# Patient Record
Sex: Male | Born: 1935
Health system: Southern US, Community
[De-identification: ages and names within clinical notes are randomized; demographics above are authoritative.]

## PROBLEM LIST (undated history)

## (undated) DIAGNOSIS — C801 Malignant (primary) neoplasm, unspecified: Secondary | ICD-10-CM

## (undated) DIAGNOSIS — Z9889 Other specified postprocedural states: Secondary | ICD-10-CM

## (undated) DIAGNOSIS — G4733 Obstructive sleep apnea (adult) (pediatric): Secondary | ICD-10-CM

## (undated) DIAGNOSIS — Z8719 Personal history of other diseases of the digestive system: Secondary | ICD-10-CM

## (undated) DIAGNOSIS — M1990 Unspecified osteoarthritis, unspecified site: Secondary | ICD-10-CM

## (undated) DIAGNOSIS — I10 Essential (primary) hypertension: Secondary | ICD-10-CM

## (undated) DIAGNOSIS — D126 Benign neoplasm of colon, unspecified: Secondary | ICD-10-CM

## (undated) DIAGNOSIS — E785 Hyperlipidemia, unspecified: Secondary | ICD-10-CM

## (undated) DIAGNOSIS — H269 Unspecified cataract: Secondary | ICD-10-CM

## (undated) HISTORY — DX: Essential (primary) hypertension: I10

## (undated) HISTORY — DX: Hyperlipidemia, unspecified: E78.5

## (undated) HISTORY — DX: Benign neoplasm of colon, unspecified: D12.6

## (undated) HISTORY — DX: Unspecified osteoarthritis, unspecified site: M19.90

## (undated) HISTORY — DX: Unspecified cataract: H26.9

## (undated) HISTORY — DX: Personal history of other diseases of the digestive system: Z87.19

## (undated) HISTORY — DX: Other specified postprocedural states: Z98.890

## (undated) HISTORY — DX: Malignant (primary) neoplasm, unspecified: C80.1

## (undated) HISTORY — DX: Obstructive sleep apnea (adult) (pediatric): G47.33

---

## 1998-10-06 ENCOUNTER — Encounter: Payer: Self-pay | Admitting: Internal Medicine

## 1998-10-06 ENCOUNTER — Ambulatory Visit (HOSPITAL_COMMUNITY): Admission: RE | Admit: 1998-10-06 | Discharge: 1998-10-06 | Payer: Self-pay | Admitting: Internal Medicine

## 2001-01-01 ENCOUNTER — Ambulatory Visit (HOSPITAL_BASED_OUTPATIENT_CLINIC_OR_DEPARTMENT_OTHER): Admission: RE | Admit: 2001-01-01 | Discharge: 2001-01-01 | Payer: Self-pay | Admitting: Otolaryngology

## 2004-10-29 ENCOUNTER — Ambulatory Visit: Payer: Self-pay | Admitting: Internal Medicine

## 2004-11-14 ENCOUNTER — Ambulatory Visit: Payer: Self-pay | Admitting: Internal Medicine

## 2004-11-16 ENCOUNTER — Ambulatory Visit: Payer: Self-pay | Admitting: Internal Medicine

## 2005-03-11 ENCOUNTER — Ambulatory Visit: Payer: Self-pay | Admitting: Internal Medicine

## 2005-09-09 ENCOUNTER — Ambulatory Visit: Payer: Self-pay | Admitting: Internal Medicine

## 2005-10-02 ENCOUNTER — Ambulatory Visit: Payer: Self-pay | Admitting: Internal Medicine

## 2005-10-03 ENCOUNTER — Ambulatory Visit: Payer: Self-pay | Admitting: Internal Medicine

## 2005-10-09 ENCOUNTER — Ambulatory Visit: Payer: Self-pay | Admitting: Internal Medicine

## 2005-11-21 ENCOUNTER — Ambulatory Visit: Payer: Self-pay | Admitting: Internal Medicine

## 2006-04-16 ENCOUNTER — Ambulatory Visit: Payer: Self-pay | Admitting: Internal Medicine

## 2006-05-07 ENCOUNTER — Ambulatory Visit: Payer: Self-pay | Admitting: Internal Medicine

## 2006-06-02 ENCOUNTER — Ambulatory Visit: Payer: Self-pay | Admitting: Internal Medicine

## 2006-07-21 ENCOUNTER — Ambulatory Visit: Payer: Self-pay | Admitting: Internal Medicine

## 2006-07-31 ENCOUNTER — Ambulatory Visit: Payer: Self-pay | Admitting: Internal Medicine

## 2006-09-24 ENCOUNTER — Ambulatory Visit: Payer: Self-pay | Admitting: Internal Medicine

## 2006-12-17 ENCOUNTER — Ambulatory Visit: Payer: Self-pay | Admitting: Internal Medicine

## 2006-12-18 ENCOUNTER — Ambulatory Visit: Payer: Self-pay | Admitting: Internal Medicine

## 2006-12-18 LAB — CONVERTED CEMR LAB
ALT: 35 units/L (ref 0–40)
AST: 28 units/L (ref 0–37)
Basophils Absolute: 0 10*3/uL (ref 0.0–0.1)
Basophils Relative: 0.3 % (ref 0.0–1.0)
Chol/HDL Ratio, serum: 2.3
Cholesterol: 129 mg/dL (ref 0–200)
Creatinine, Ser: 1 mg/dL (ref 0.4–1.5)
Eosinophil percent: 1.4 % (ref 0.0–5.0)
HCT: 45.1 % (ref 39.0–52.0)
HDL: 55 mg/dL (ref >39.0)
Hemoglobin: 15.1 g/dL (ref 13.0–17.0)
LDL Cholesterol: 66 mg/dL (ref 0–99)
Lymphocytes Relative: 69.8 % — ABNORMAL HIGH (ref 12.0–46.0)
MCHC: 33.5 g/dL (ref 30.0–36.0)
MCV: 93.2 fL (ref 78.0–100.0)
Monocytes Absolute: 0.6 10*3/uL (ref 0.2–0.7)
Monocytes Relative: 4.6 % (ref 3.0–11.0)
Neutro Abs: 3 10*3/uL (ref 1.4–7.7)
Neutrophils Relative %: 23.9 % — ABNORMAL LOW (ref 43.0–77.0)
Platelets: 178 10*3/uL (ref 150–400)
Potassium: 4.2 meq/L (ref 3.5–5.1)
RBC: 4.84 M/uL (ref 4.22–5.81)
RDW: 12.7 % (ref 11.5–14.6)
TSH: 1.7 microintl units/mL (ref 0.35–5.50)
Triglyceride fasting, serum: 40 mg/dL (ref 0–149)
VLDL: 8 mg/dL (ref 0–40)
Vitamin B-12: 427 pg/mL (ref 211–911)
WBC: 12.7 10*3/uL — ABNORMAL HIGH (ref 4.5–10.5)

## 2007-05-01 ENCOUNTER — Ambulatory Visit: Payer: Self-pay | Admitting: Internal Medicine

## 2007-07-25 ENCOUNTER — Encounter: Payer: Self-pay | Admitting: *Deleted

## 2007-07-25 DIAGNOSIS — I10 Essential (primary) hypertension: Secondary | ICD-10-CM

## 2007-07-25 HISTORY — DX: Essential (primary) hypertension: I10

## 2007-09-05 DIAGNOSIS — G4733 Obstructive sleep apnea (adult) (pediatric): Secondary | ICD-10-CM

## 2007-09-05 DIAGNOSIS — Z8719 Personal history of other diseases of the digestive system: Secondary | ICD-10-CM

## 2007-09-05 DIAGNOSIS — Z9889 Other specified postprocedural states: Secondary | ICD-10-CM

## 2007-09-05 DIAGNOSIS — M1990 Unspecified osteoarthritis, unspecified site: Secondary | ICD-10-CM | POA: Insufficient documentation

## 2007-09-05 DIAGNOSIS — E785 Hyperlipidemia, unspecified: Secondary | ICD-10-CM

## 2007-09-05 HISTORY — DX: Personal history of other diseases of the digestive system: Z87.19

## 2007-09-05 HISTORY — DX: Hyperlipidemia, unspecified: E78.5

## 2007-09-05 HISTORY — DX: Other specified postprocedural states: Z98.890

## 2007-09-05 HISTORY — DX: Unspecified osteoarthritis, unspecified site: M19.90

## 2007-09-05 HISTORY — DX: Obstructive sleep apnea (adult) (pediatric): G47.33

## 2007-09-22 ENCOUNTER — Ambulatory Visit: Payer: Self-pay | Admitting: Internal Medicine

## 2008-01-11 ENCOUNTER — Ambulatory Visit: Payer: Self-pay | Admitting: Internal Medicine

## 2008-01-11 DIAGNOSIS — R7989 Other specified abnormal findings of blood chemistry: Secondary | ICD-10-CM | POA: Insufficient documentation

## 2008-01-11 LAB — CONVERTED CEMR LAB
ALT: 30 units/L (ref 0–53)
AST: 24 units/L (ref 0–37)
Albumin: 4.2 g/dL (ref 3.5–5.2)
Alkaline Phosphatase: 66 units/L (ref 39–117)
BUN: 14 mg/dL (ref 6–23)
Basophils Absolute: 0 10*3/uL (ref 0.0–0.1)
Basophils Relative: 0 % (ref 0.0–1.0)
Bilirubin, Direct: 0.3 mg/dL (ref 0.0–0.3)
CO2: 32 meq/L (ref 19–32)
Calcium: 9.4 mg/dL (ref 8.4–10.5)
Chloride: 101 meq/L (ref 96–112)
Cholesterol: 135 mg/dL (ref 0–200)
Creatinine, Ser: 1.1 mg/dL (ref 0.4–1.5)
Eosinophils Absolute: 0.2 10*3/uL (ref 0.0–0.6)
Eosinophils Relative: 0.7 % (ref 0.0–5.0)
GFR calc Af Amer: 84 mL/min
GFR calc non Af Amer: 70 mL/min
Glucose, Bld: 95 mg/dL (ref 70–99)
HCT: 46.7 % (ref 39.0–52.0)
HDL: 63 mg/dL (ref >39.0)
Hemoglobin: 15.9 g/dL (ref 13.0–17.0)
LDL Cholesterol: 64 mg/dL (ref 0–99)
Lymphocytes Relative: 75.7 % — ABNORMAL HIGH (ref 12.0–46.0)
MCHC: 34 g/dL (ref 30.0–36.0)
MCV: 94.4 fL (ref 78.0–100.0)
Monocytes Absolute: 1 10*3/uL — ABNORMAL HIGH (ref 0.2–0.7)
Monocytes Relative: 4.4 % (ref 3.0–11.0)
Neutro Abs: 4.2 10*3/uL (ref 1.4–7.7)
Neutrophils Relative %: 19.2 % — ABNORMAL LOW (ref 43.0–77.0)
PSA: 6.18 ng/mL — ABNORMAL HIGH (ref 0.10–4.00)
Platelets: 163 10*3/uL (ref 150–400)
Potassium: 4.6 meq/L (ref 3.5–5.1)
RBC: 4.95 M/uL (ref 4.22–5.81)
RDW: 12.7 % (ref 11.5–14.6)
Sodium: 141 meq/L (ref 135–145)
Total Bilirubin: 1.6 mg/dL — ABNORMAL HIGH (ref 0.3–1.2)
Total CHOL/HDL Ratio: 2.1
Total Protein: 6.5 g/dL (ref 6.0–8.3)
Triglycerides: 39 mg/dL (ref 0–149)
VLDL: 8 mg/dL (ref 0–40)
WBC: 22 10*3/uL (ref 4.5–10.5)

## 2008-01-14 ENCOUNTER — Encounter: Payer: Self-pay | Admitting: Internal Medicine

## 2008-01-25 ENCOUNTER — Encounter: Payer: Self-pay | Admitting: Internal Medicine

## 2008-02-17 ENCOUNTER — Encounter: Payer: Self-pay | Admitting: Internal Medicine

## 2008-02-18 ENCOUNTER — Encounter: Payer: Self-pay | Admitting: Internal Medicine

## 2008-03-03 ENCOUNTER — Ambulatory Visit: Payer: Self-pay | Admitting: Internal Medicine

## 2008-08-18 ENCOUNTER — Ambulatory Visit: Payer: Self-pay | Admitting: Pulmonary Disease

## 2008-08-18 ENCOUNTER — Ambulatory Visit: Payer: Self-pay | Admitting: Internal Medicine

## 2008-08-18 ENCOUNTER — Telehealth: Payer: Self-pay | Admitting: Internal Medicine

## 2008-08-29 ENCOUNTER — Ambulatory Visit: Payer: Self-pay | Admitting: Internal Medicine

## 2008-09-07 ENCOUNTER — Ambulatory Visit: Payer: Self-pay | Admitting: Internal Medicine

## 2008-09-08 ENCOUNTER — Encounter: Payer: Self-pay | Admitting: Internal Medicine

## 2008-09-17 ENCOUNTER — Encounter: Payer: Self-pay | Admitting: Internal Medicine

## 2009-02-15 ENCOUNTER — Telehealth: Payer: Self-pay | Admitting: Internal Medicine

## 2009-03-02 ENCOUNTER — Ambulatory Visit: Payer: Self-pay | Admitting: Internal Medicine

## 2009-03-02 DIAGNOSIS — D126 Benign neoplasm of colon, unspecified: Secondary | ICD-10-CM | POA: Insufficient documentation

## 2009-03-02 HISTORY — DX: Benign neoplasm of colon, unspecified: D12.6

## 2009-03-02 LAB — CONVERTED CEMR LAB
ALT: 36 units/L (ref 0–53)
AST: 31 units/L (ref 0–37)
Albumin: 4.4 g/dL (ref 3.5–5.2)
Alkaline Phosphatase: 67 units/L (ref 39–117)
BUN: 12 mg/dL (ref 6–23)
Basophils Absolute: 0.1 10*3/uL (ref 0.0–0.1)
Basophils Relative: 0.6 % (ref 0.0–3.0)
Bilirubin, Direct: 0.3 mg/dL (ref 0.0–0.3)
CO2: 32 meq/L (ref 19–32)
Calcium: 9.2 mg/dL (ref 8.4–10.5)
Chloride: 101 meq/L (ref 96–112)
Cholesterol: 143 mg/dL (ref 0–200)
Creatinine, Ser: 1 mg/dL (ref 0.4–1.5)
Eosinophils Absolute: 0.1 10*3/uL (ref 0.0–0.7)
Eosinophils Relative: 0.5 % (ref 0.0–5.0)
GFR calc non Af Amer: 77.29 mL/min (ref >60)
Glucose, Bld: 95 mg/dL (ref 70–99)
HCT: 46.7 % (ref 39.0–52.0)
HDL: 64 mg/dL (ref >39.00)
Hemoglobin: 15.6 g/dL (ref 13.0–17.0)
LDL Cholesterol: 68 mg/dL (ref 0–99)
Lymphocytes Relative: 72.2 % — ABNORMAL HIGH (ref 12.0–46.0)
Lymphs Abs: 17.8 10*3/uL — ABNORMAL HIGH (ref 0.7–4.0)
MCHC: 33.5 g/dL (ref 30.0–36.0)
MCV: 96.4 fL (ref 78.0–100.0)
Monocytes Absolute: 1.2 10*3/uL — ABNORMAL HIGH (ref 0.1–1.0)
Monocytes Relative: 5.1 % (ref 3.0–12.0)
Neutro Abs: 5.3 10*3/uL (ref 1.4–7.7)
Neutrophils Relative %: 21.6 % — ABNORMAL LOW (ref 43.0–77.0)
Platelets: 149 10*3/uL — ABNORMAL LOW (ref 150.0–400.0)
Potassium: 4.3 meq/L (ref 3.5–5.1)
RBC: 4.85 M/uL (ref 4.22–5.81)
RDW: 12.9 % (ref 11.5–14.6)
Sodium: 142 meq/L (ref 135–145)
Total Bilirubin: 2.1 mg/dL — ABNORMAL HIGH (ref 0.3–1.2)
Total CHOL/HDL Ratio: 2
Total Protein: 6.8 g/dL (ref 6.0–8.3)
Triglycerides: 53 mg/dL (ref 0.0–149.0)
VLDL: 10.6 mg/dL (ref 0.0–40.0)
WBC: 24.5 10*3/uL (ref 4.5–10.5)

## 2009-08-18 ENCOUNTER — Encounter: Payer: Self-pay | Admitting: Internal Medicine

## 2009-08-25 ENCOUNTER — Encounter: Payer: Self-pay | Admitting: Internal Medicine

## 2009-09-20 ENCOUNTER — Ambulatory Visit: Payer: Self-pay | Admitting: Internal Medicine

## 2009-09-22 ENCOUNTER — Ambulatory Visit: Payer: Self-pay | Admitting: Internal Medicine

## 2009-09-25 ENCOUNTER — Ambulatory Visit: Payer: Self-pay | Admitting: Internal Medicine

## 2009-10-02 ENCOUNTER — Ambulatory Visit: Payer: Self-pay | Admitting: Internal Medicine

## 2009-10-04 ENCOUNTER — Encounter: Payer: Self-pay | Admitting: Internal Medicine

## 2009-10-04 ENCOUNTER — Other Ambulatory Visit: Admission: RE | Admit: 2009-10-04 | Discharge: 2009-10-04 | Payer: Self-pay | Admitting: Internal Medicine

## 2009-10-04 LAB — COMPREHENSIVE METABOLIC PANEL
ALT: 25 U/L (ref 0–53)
AST: 21 U/L (ref 0–37)
Creatinine, Ser: 1.17 mg/dL (ref 0.40–1.50)
Sodium: 140 mEq/L (ref 135–145)
Total Bilirubin: 1.3 mg/dL — ABNORMAL HIGH (ref 0.3–1.2)
Total Protein: 6.6 g/dL (ref 6.0–8.3)

## 2009-10-04 LAB — CBC WITH DIFFERENTIAL/PLATELET
BASO%: 0.2 % (ref 0.0–2.0)
LYMPH%: 80.1 % — ABNORMAL HIGH (ref 14.0–49.0)
MCHC: 33.7 g/dL (ref 32.0–36.0)
MCV: 95.4 fL (ref 79.3–98.0)
MONO#: 0.7 10*3/uL (ref 0.1–0.9)
MONO%: 2.9 % (ref 0.0–14.0)
NEUT#: 3.9 10*3/uL (ref 1.5–6.5)
Platelets: 165 10*3/uL (ref 140–400)
RBC: 4.73 10*6/uL (ref 4.20–5.82)
RDW: 13.2 % (ref 11.0–14.6)
WBC: 24.5 10*3/uL — ABNORMAL HIGH (ref 4.0–10.3)

## 2009-10-04 LAB — TECHNOLOGIST REVIEW

## 2009-10-04 LAB — URIC ACID: Uric Acid, Serum: 8.2 mg/dL — ABNORMAL HIGH (ref 4.0–7.8)

## 2009-10-26 ENCOUNTER — Encounter (INDEPENDENT_AMBULATORY_CARE_PROVIDER_SITE_OTHER): Payer: Self-pay | Admitting: *Deleted

## 2010-01-15 ENCOUNTER — Ambulatory Visit: Payer: Self-pay | Admitting: Internal Medicine

## 2010-04-09 ENCOUNTER — Ambulatory Visit: Payer: Self-pay | Admitting: Internal Medicine

## 2010-04-09 LAB — CONVERTED CEMR LAB
ALT: 33 units/L (ref 0–53)
AST: 29 units/L (ref 0–37)
Albumin: 4.6 g/dL (ref 3.5–5.2)
Alkaline Phosphatase: 67 units/L (ref 39–117)
BUN: 11 mg/dL (ref 6–23)
Basophils Absolute: 0 10*3/uL (ref 0.0–0.1)
Basophils Relative: 0 % (ref 0–1)
Bilirubin, Direct: 0.3 mg/dL (ref 0.0–0.3)
CO2: 35 meq/L — ABNORMAL HIGH (ref 19–32)
Calcium: 9.5 mg/dL (ref 8.4–10.5)
Chloride: 100 meq/L (ref 96–112)
Cholesterol: 144 mg/dL (ref 0–200)
Creatinine, Ser: 1 mg/dL (ref 0.4–1.5)
Eosinophils Absolute: 0.1 10*3/uL (ref 0.0–0.7)
Eosinophils Relative: 0 % (ref 0–5)
GFR calc non Af Amer: 77.06 mL/min (ref >60)
Glucose, Bld: 86 mg/dL (ref 70–99)
HCT: 46.6 % (ref 39.0–52.0)
HDL: 57.9 mg/dL (ref >39.00)
Hemoglobin: 15.6 g/dL (ref 13.0–17.0)
LDL Cholesterol: 71 mg/dL (ref 0–99)
Lymphocytes Relative: 79 % — ABNORMAL HIGH (ref 12–46)
Lymphs Abs: 17.7 10*3/uL — ABNORMAL HIGH (ref 0.7–4.0)
MCHC: 33.5 g/dL (ref 30.0–36.0)
MCV: 93.2 fL (ref 78.0–100.0)
Monocytes Absolute: 0.5 10*3/uL (ref 0.1–1.0)
Monocytes Relative: 2 % — ABNORMAL LOW (ref 3–12)
Neutro Abs: 4.1 10*3/uL (ref 1.7–7.7)
Neutrophils Relative %: 18 % — ABNORMAL LOW (ref 43–77)
Platelets: 160 10*3/uL (ref 150–400)
Potassium: 4.3 meq/L (ref 3.5–5.1)
RBC: 5 M/uL (ref 4.22–5.81)
RDW: 13.5 % (ref 11.5–15.5)
Sodium: 144 meq/L (ref 135–145)
TSH: 1.48 microintl units/mL (ref 0.35–5.50)
Total Bilirubin: 1.7 mg/dL — ABNORMAL HIGH (ref 0.3–1.2)
Total CHOL/HDL Ratio: 2
Total Protein: 6.5 g/dL (ref 6.0–8.3)
Triglycerides: 78 mg/dL (ref 0.0–149.0)
Uric Acid, Serum: 7.2 mg/dL (ref 4.0–7.8)
VLDL: 15.6 mg/dL (ref 0.0–40.0)
WBC: 22.4 10*3/uL — ABNORMAL HIGH (ref 4.0–10.5)

## 2010-04-10 ENCOUNTER — Telehealth: Payer: Self-pay | Admitting: Internal Medicine

## 2010-04-10 ENCOUNTER — Ambulatory Visit: Payer: Self-pay | Admitting: Internal Medicine

## 2010-04-11 ENCOUNTER — Encounter: Payer: Self-pay | Admitting: Internal Medicine

## 2010-04-11 LAB — CBC WITH DIFFERENTIAL/PLATELET
BASO%: 0.2 % (ref 0.0–2.0)
Basophils Absolute: 0 10*3/uL (ref 0.0–0.1)
EOS%: 0.6 % (ref 0.0–7.0)
Eosinophils Absolute: 0.1 10*3/uL (ref 0.0–0.5)
HCT: 44.8 % (ref 38.4–49.9)
HGB: 15.1 g/dL (ref 13.0–17.1)
LYMPH%: 81 % — ABNORMAL HIGH (ref 14.0–49.0)
MCH: 32.2 pg (ref 27.2–33.4)
MCHC: 33.8 g/dL (ref 32.0–36.0)
MCV: 95.4 fL (ref 79.3–98.0)
MONO#: 0.6 10*3/uL (ref 0.1–0.9)
MONO%: 2.6 % (ref 0.0–14.0)
NEUT#: 3.5 10*3/uL (ref 1.5–6.5)
NEUT%: 15.6 % — ABNORMAL LOW (ref 39.0–75.0)
Platelets: 156 10*3/uL (ref 140–400)
RBC: 4.7 10*6/uL (ref 4.20–5.82)
RDW: 13.6 % (ref 11.0–14.6)
WBC: 22.3 10*3/uL — ABNORMAL HIGH (ref 4.0–10.3)
lymph#: 18.1 10*3/uL — ABNORMAL HIGH (ref 0.9–3.3)

## 2010-04-11 LAB — LACTATE DEHYDROGENASE: LDH: 179 U/L (ref 94–250)

## 2010-04-24 ENCOUNTER — Encounter: Payer: Self-pay | Admitting: Internal Medicine

## 2010-09-06 ENCOUNTER — Ambulatory Visit: Payer: Self-pay | Admitting: Internal Medicine

## 2010-09-10 ENCOUNTER — Ambulatory Visit: Payer: Self-pay | Admitting: Internal Medicine

## 2010-10-12 ENCOUNTER — Ambulatory Visit: Payer: Self-pay | Admitting: Internal Medicine

## 2010-10-16 ENCOUNTER — Encounter: Payer: Self-pay | Admitting: Internal Medicine

## 2010-10-16 LAB — CBC WITH DIFFERENTIAL/PLATELET
BASO%: 0.2 % (ref 0.0–2.0)
LYMPH%: 77.1 % — ABNORMAL HIGH (ref 14.0–49.0)
MCHC: 34.5 g/dL (ref 32.0–36.0)
MCV: 94.8 fL (ref 79.3–98.0)
MONO#: 0.6 10*3/uL (ref 0.1–0.9)
MONO%: 2.8 % (ref 0.0–14.0)
Platelets: 151 10*3/uL (ref 140–400)
RBC: 4.43 10*6/uL (ref 4.20–5.82)
WBC: 21.9 10*3/uL — ABNORMAL HIGH (ref 4.0–10.3)

## 2010-10-16 LAB — TECHNOLOGIST REVIEW

## 2010-11-07 ENCOUNTER — Ambulatory Visit: Payer: Self-pay | Admitting: Internal Medicine

## 2011-01-08 NOTE — Progress Notes (Signed)
Summary: Call Report  Phone Note Other Incoming   Caller: [facility] Call Report Summary of Call: [facility] Triage Call Report Triage Record Num: [id number] Operator: Estevan Oaks Patient Name: Praneeth Bussey Call Date & Time: 04/09/2010 10:08:55PM Patient Phone: [phone] PCP: Illene Regulus Patient Gender: Male PCP Fax : [phone] Patient DOB: 1931-10-15 Practice Name: [facility] Reason for Call: Corrie Dandy is calling from [facility] [phone] with stat results from CBC: WBC 22.4, Gran % 18, Lymph % 79, absol lymp 17.7, Mono % 2.0, all other results WNL. Note faxed to office for review. Protocol(s) Used: PCP Calls, No Triage (Adult) Recommended Outcome per Protocol: Call Provider within 24 Hours Reason for Outcome: Lab calling with test results Care Advice:  ~ 05/ Initial call taken by: Margaret Pyle, CMA,  Apr 10, 2010 8:18 AM

## 2011-01-08 NOTE — Assessment & Plan Note (Signed)
Summary: FLU VAC AND BP CHECK ALSO--STC  Nurse Visit   Vital Signs:  Patient profile:   77 year old male Temp:     97.5 degrees F oral  Vitals Entered By: Lanier Prude, CMA(AAMA) (September 06, 2010 3:34 PM)  Allergies: No Known Drug Allergies  Orders Added: 1)  Flu Vaccine 69yrs + MEDICARE PATIENTS [Q2039] 2)  Administration Flu vaccine - MCR [G0008] .lbmedflu   Flu Vaccine Consent Questions     Do you have a history of severe allergic reactions to this vaccine? no    Any prior history of allergic reactions to egg and/or gelatin? no    Do you have a sensitivity to the preservative Thimersol? no    Do you have a past history of Guillan-Barre Syndrome? no    Do you currently have an acute febrile illness? no    Have you ever had a severe reaction to latex? no    Vaccine information given and explained to patient? yes    Are you currently pregnant? no    Lot Number:[id number]   Exp Date:06/08/2011   Site Given  Left Deltoid IM Lanier Prude, [facility])  September 06, 2010 3:34 PM

## 2011-01-08 NOTE — Letter (Signed)
Summary: [facility]N/[facility]  CMN/[facility]   Imported By: Lester [place] 04/27/2010 09:06:14  _____________________________________________________________________  External Attachment:    Type:   Image     Comment:   External Document

## 2011-01-08 NOTE — Assessment & Plan Note (Signed)
Summary: YEARLY--MEDICARE   Vital Signs:  Patient profile:   77 year old male Height:      65 inches Weight:      180 pounds BMI:     30.06 O2 Sat:      97 % on Room air Temp:     98.1 degrees F oral Pulse rate:   56 / minute BP sitting:   118 / 70  (left arm) Cuff size:   regular  Vitals Entered By: Selso Salinas CMA (Apr 09, 2010 2:46 PM)  O2 Flow:  Room air  Vision Screening:      Vision Comments: 12/2009 with [facility] eye clinic   Primary Care Provider:  Janai Maudlin   History of Present Illness: Patinet reports he has been feeling well except for usual aches and pains. He is gtroubled by joint aches and pains, or back discomfort. But, no limitations in activities. He is otherwise doing well: No medical illnesses, injuries or surgeries. Dr. Arbutus Ped had suggested using allopurinol but I do not have a uric acid level to know if this was the concern.  Current Medications (verified): 1)  Bayer Aspirin 325 Mg  Tabs (Aspirin) .... Take 1 Tablet By Mouth Once A Day 2)  Multi Vitamins .... Once Daily 3)  Metoprolol Succinate 50 Mg Xr24h-Tab (Metoprolol Succinate) .... Once Daily 4)  Lipitor 20 Mg  Tabs (Atorvastatin Calcium) .... Once Daily 5)  Furosemide 40 Mg Tabs (Furosemide) .... Take One Tablet Daily. 6)  Cpap .... As Directed 7)  Viagra 100 Mg Tabs (Sildenafil Citrate) .... 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  Past Medical History: OTHER SPECIFIED DISEASE OF WHITE BLOOD CELLS  (ICD-288.8) COLONIC POLYPS (ICD-211.3) OTHER ABNORMAL BLOOD CHEMISTRY (ICD-790.6) SPECIAL SCREENING MALIGNANT NEOPLASM OF PROSTATE (ICD-V76.44) HEMORRHOIDS, HX OF (ICD-V12.79) EYE SURGERY, HX OF (ICD-V15.2) HYPERLIPIDEMIA (ICD-272.4) OSTEOARTHRITIS (ICD-715.90) SLEEP APNEA, OBSTRUCTIVE (ICD-327.23) HYPERTENSION (ICD-401.9)        Review of Systems       The patient complains of weight loss.  The patient denies anorexia, fever, weight gain, vision loss, decreased hearing, hoarseness, syncope, dyspnea on exertion, peripheral edema, prolonged cough, abdominal pain, severe indigestion/heartburn, hematuria, incontinence, difficulty walking, depression, abnormal bleeding, and enlarged lymph nodes.    Physical Exam  General:  WNWD whie male in no distress Head:  Normocephalic and atraumatic without obvious abnormalities. No apparent alopecia or balding. Eyes:  No corneal or conjunctival inflammation noted. EOMI. Perrla. Funduscopic exam benign, without hemorrhages, exudates or papilledema. Vision grossly normal. Ears:  External ear exam shows no significant lesions or deformities.  Otoscopic examination reveals clear canals, tympanic membranes are intact bilaterally without bulging, retraction, inflammation or discharge. Hearing is grossly normal bilaterally. Nose:  no external deformity and no external erythema.   Mouth:  Oral mucosa and oropharynx without lesions or exudates.  Teeth in good repair. Neck:  supple, full ROM, no thyromegaly, and no carotid bruits.   Chest Wall:  No deformities, masses, tenderness or gynecomastia noted. Lungs:  Normal respiratory effort, chest expands symmetrically. Lungs are clear  to auscultation, no crackles or wheezes. Heart:  Normal rate and regular rhythm. S1 and S2 normal without gallop, murmur, click, rub or other extra sounds. Abdomen:  soft, non-tender, normal bowel sounds, no masses, no guarding, no abdominal hernia, and no hepatomegaly.   Rectal:   No external abnormalities noted. Normal sphincter tone. No rectal masses or tenderness. Prostate:  Prostate gland firm and smooth, no enlargement, nodularity, tenderness, mass, asymmetry or induration. Msk:  normal ROM, no joint tenderness, no joint swelling, no joint warmth, no joint deformities, and no joint instability.   Pulses:  2+ radial and DP pulses Extremities:  1+ pedal edema bilaterally Neurologic:  alert & oriented X3, cranial nerves II-XII intact, strength normal in all extremities, gait normal, and DTRs symmetrical and normal.   Skin:  turgor normal, color normal, no rashes, no purpura, and no ulcerations.   Cervical Nodes:  no anterior cervical adenopathy and no posterior cervical adenopathy.   Inguinal Nodes:  no R inguinal adenopathy and no L inguinal adenopathy.   Psych:  Oriented X3, memory intact for recent and remote, normally interactive, and good eye contact.     Impression & Recommendations:  Problem # 1:  OTHER SPECIFIED DISEASE OF WHITE BLOOD CELLS (ICD-288.8) Diagnosed with CLL. He has remained asymptomatic without any febrile illness  Plan - routine lab           has f/u appointment with Dr. Arbutus Ped  Orders: T- * Misc. Laboratory test [phone])  Problem # 2:  HYPERLIPIDEMIA (ICD-272.4) Due for follow-up lab with recommendations to follow.  His updated medication list for this problem includes:    Lipitor 20 Mg Tabs (Atorvastatin calcium) ..... Once daily  Orders: TLB-Lipid Panel (80061-LIPID) TLB-Hepatic/Liver Function Pnl (80076-HEPATIC) TLB-TSH (Thyroid Stimulating Hormone) (84443-TSH)  Problem # 3:  HYPERTENSION (ICD-401.9)  His updated medication list for this problem includes:    Metoprolol Succinate 50 Mg Xr24h-tab (Metoprolol succinate) ..... Once daily    Furosemide 40 Mg Tabs (Furosemide) .[name]... Take one tablet daily.  Orders: TLB-BMP (Basic Metabolic Panel-BMET) (80048-METABOL)  BP today: 118/70 Prior BP: 110/70 (01/15/2010)  Prior 10  Yr Risk Heart Disease: 7 % (09/22/2009)  Good control on present medications.  Problem # 4:  OSTEOARTHRITIS (ICD-715.90) Stable. there was a recommendation for him to start allopurinol with no record in EMR of elevated uric acid.  Plan - uric acid level.  His updated medication list for this problem includes:    Bayer Aspirin 325 Mg Tabs (Aspirin) .[name]... Take 1 tablet by mouth once a day  Orders: TLB-Uric Acid, Blood (84550-URIC)  Problem # 5:  Preventive Health Care (ICD-V70.0) Unremarkable exam today. He is current with prostate screening (DRE) and colorectal cancer screening. Immunizations are up to date.   In summary - a very nice man who is medically stable. He will return as needed or 1 year.   Complete Medication List: 1)  Bayer Aspirin 325 Mg Tabs (Aspirin) .... Take 1 tablet by mouth once a day 2)  Multi Vitamins  .... Once daily 3)  Metoprolol Succinate 50 Mg Xr24h-tab (Metoprolol succinate) .... Once daily 4)  Lipitor 20 Mg Tabs (Atorvastatin calcium) .... Once daily 5)  Furosemide 40 Mg Tabs (Furosemide) .... Take one tablet daily. 6)  Cpap  .... As directed 7)  Viagra 100 Mg Tabs (Sildenafil citrate) .... Take as directed

## 2011-01-08 NOTE — Assessment & Plan Note (Signed)
Summary: left big toe gout/cd   Vital Signs:  Patient profile:   77 year old male Height:      65 inches Weight:      183 pounds BMI:     30.56 O2 Sat:      98 % on Room air Temp:     97.7 degrees F oral Pulse rate:   59 / minute BP sitting:   122 / 68  (left arm) Cuff size:   regular  Vitals Entered By: Brok Salinas CMA (September 10, 2010 3:12 PM)  O2 Flow:  Room air CC: pt ? gout in great left toe/ ab   Primary Care Provider:  Norins  CC:  pt ? gout in great left toe/ ab.  History of Present Illness: red, hot., tender left 1st MTP since saturday. no history of gout. No recent injury. He does admit to increase consumption of red wine and seafood over the past several weeks.  Current Medications (verified): 1)  Bayer Aspirin 325 Mg  Tabs (Aspirin) .... Take 1 Tablet By Mouth Once A Day 2)  Multi Vitamins .... Once Daily 3)  Metoprolol Succinate 50 Mg Xr24h-Tab (Metoprolol Succinate) .... Once Daily 4)  Lipitor 20 Mg  Tabs (Atorvastatin Calcium) .... Once Daily 5)  Furosemide 40 Mg Tabs (Furosemide) .... Take One Tablet Daily. 6)  Cpap .... As Directed 7)  Viagra 100 Mg Tabs (Sildenafil Citrate) .... Take As Directed  Allergies (verified): No Known Drug Allergies  Past History:  Past Medical History: Last updated: 04/09/2010 OTHER SPECIFIED DISEASE OF WHITE BLOOD CELLS (ICD-288.8) COLONIC POLYPS (ICD-211.3) OTHER ABNORMAL BLOOD CHEMISTRY (ICD-790.6) SPECIAL SCREENING MALIGNANT NEOPLASM OF PROSTATE (ICD-V76.44) HEMORRHOIDS, HX OF (ICD-V12.79) EYE SURGERY, HX OF (ICD-V15.2) HYPERLIPIDEMIA (ICD-272.4) OSTEOARTHRITIS (ICD-715.90) SLEEP APNEA, OBSTRUCTIVE (ICD-327.23) HYPERTENSION (ICD-401.9)        Past Surgical History: Last updated: 07/25/2007 EYE SURGERY, HX OF (ICD-V15.2) PSH reviewed for relevance, FH reviewed for relevance  Review of Systems MS:  Complains of joint pain, joint redness, and joint swelling; denies loss of strength, low back pain, muscle  aches, cramps, and muscle weakness.  Physical Exam  General:  Well-developed,well-nourished,in no acute distress; alert,appropriate and cooperative throughout examination Lungs:  normal respiratory effort.   Heart:  normal rate and regular rhythm.   Msk:  left 1st MTP red, hot and painful. Normal flexion and extension of the joint.    Impression & Recommendations:  Problem # 1:  FOOT PAIN, LEFT (ICD-729.5) red painful mtp joint suggestive of gout. No prior history bu indescrete diet.  Plan - indomethacin 25mg  three times a day for up to 7 days. GI precautions given          Patient handouts (CareNotes) on gout and low purine diet.          for recurrence - will need uric acid level, possibly joint aspiration.  Complete Medication List: 1)  Bayer Aspirin 325 Mg Tabs (Aspirin) .... Take 1 tablet by mouth once a day 2)  Multi Vitamins  .... Once daily 3)  Metoprolol Succinate 50 Mg Xr24h-tab (Metoprolol succinate) .... Once daily 4)  Lipitor 20 Mg Tabs (Atorvastatin calcium) .... Once daily 5)  Furosemide 40 Mg Tabs (Furosemide) .... Take one tablet daily. 6)  Cpap  .... As directed 7)  Viagra 100 Mg Tabs (Sildenafil citrate) .... Take as directed 8)  Indomethacin 25 Mg Caps (Indomethacin) .[name].. 1 by mouth three times a day for gout pain. gi precautions Prescriptions: INDOMETHACIN 25 MG CAPS (  INDOMETHACIN) 1 by mouth three times a day for gout pain. GI precautions  #21 x 1   Entered and Authorized by:   Jacques Navy MD   Signed by:   Lamar Sprinkles, CMA on 09/10/2010   Method used:   Electronically to        [facility] (retail)       [address]       [place], [place]  [address]       Ph: [phone]       Fax: [phone]   RxID:   [id number]

## 2011-01-08 NOTE — Letter (Signed)
Summary: [facility]  [facility]   Imported By: Sherian Rein 10/29/2010 13:58:57  _____________________________________________________________________  External Attachment:    Type:   Image     Comment:   External Document

## 2011-01-08 NOTE — Letter (Signed)
[facility]   Imported By: Sherian Rein 04/24/2010 14:40:26  _____________________________________________________________________  External Attachment:    Type:   Image     Comment:   External Document

## 2011-01-08 NOTE — Assessment & Plan Note (Signed)
Summary: PER PT [facility]--PER [facility]  Nurse Visit   Vital Signs:  Patient profile:   77 year old male BP sitting:   120 / 70  (right arm) Cuff size:   regular  Vitals Entered By: Lamar Sprinkles, CMA (November 09, 2010 9:38 AM) CC: BP Check, concerned about elevated bp readings at home.    Allergies: No Known Drug Allergies  Orders Added: 1)  Est. Patient Level I [[address]]

## 2011-01-08 NOTE — Assessment & Plan Note (Signed)
Summary: needs to discuss something/#/cd   Vital Signs:  Patient profile:   77 year old male Height:      65 inches Weight:      184 pounds BMI:     30.73 O2 Sat:      97 % on Room air Temp:     97.9 degrees F oral Pulse rate:   56 / minute BP sitting:   110 / 70  (left arm) Cuff size:   regular  Vitals Entered By: Keil Salinas CMA (January 15, 2010 2:00 PM)  O2 Flow:  Room air   Primary Care Provider:  Nickcole Bralley   History of Present Illness: presents  with a need for renewal of Rx.   Patient has seen Dr. Arbutus Ped for consultation in regard to CLL. He is stage 0 and needs no treatment. shortly there after he was called and told to start allopurinol for reasons that excape me.     Current Medications (verified): 1)  Bayer Aspirin 325 Mg  Tabs (Aspirin) .... Take 1 Tablet By Mouth Once A Day 2)  Multi Vitamins .... Once Daily 3)  Metoprolol Succinate 50 Mg Xr24h-Tab (Metoprolol Succinate) .... Once Daily 4)  Lipitor 20 Mg  Tabs (Atorvastatin Calcium) .... Once Daily 5)  Furosemide 40 Mg Tabs (Furosemide) .... Take One Tablet Daily. 6)  Cpap .... As Directed  Allergies (verified): No Known Drug Allergies   Impression & Recommendations:  Problem # 1:  OTHER SPECIFIED DISEASE OF WHITE BLOOD CELLS (ICD-288.8)  Reviewed consult note. NO treatment at this time.   Complete Medication List: 1)  Bayer Aspirin 325 Mg Tabs (Aspirin) .... Take 1 tablet by mouth once a day 2)  Multi Vitamins  .... Once daily 3)  Metoprolol Succinate 50 Mg Xr24h-tab (Metoprolol succinate) .... Once daily 4)  Lipitor 20 Mg Tabs (Atorvastatin calcium) .... Once daily 5)  Furosemide 40 Mg Tabs (Furosemide) .... Take one tablet daily. 6)  Cpap  .... As directed 7)  Viagra 100 Mg Tabs (Sildenafil citrate) .... Take as directed  Other Orders: TD Toxoids IM 7 YR + ([address]) Pneumococcal Vaccine (60454) Admin 1st Vaccine (09811) Admin of Any Addtl Vaccine (91478) Prescriptions: VIAGRA 100 MG TABS  (SILDENAFIL CITRATE) take as directed  #18 x 3   Entered and Authorized by:   Jacques Navy MD   Signed by:   Jacques Navy MD on 01/15/2010   Method used:   Electronically to        [facility] (retail)       [address] [place], [place]  [address]       Ph: [phone]       Fax: [phone]   RxID:   [id number] FUROSEMIDE 40 MG TABS (FUROSEMIDE) Take one tablet daily.  #90 x 3   Entered and Authorized by:   Jacques Navy MD   Signed by:   Jacques Navy MD on 01/15/2010   Method used:   Electronically to        [facility] (retail)       [address] [place], [place]  [address]       Ph: [phone]       Fax: [phone]   RxID:   [id number] LIPITOR 20 MG  TABS (ATORVASTATIN CALCIUM) once daily  #90 x 3   Entered and Authorized by:   Jacques Navy MD   Signed  by:   Jacques Navy MD on 01/15/2010   Method used:   Electronically to        [facility] (retail)       [address] [place], [place]  [address]       Ph: [phone]       Fax: [phone]   RxID:   [id number] METOPROLOL SUCCINATE 50 MG XR24H-TAB (METOPROLOL SUCCINATE) once daily  #90 x 3   Entered and Authorized by:   Jacques Navy MD   Signed by:   Jacques Navy MD on 01/15/2010   Method used:   Electronically to        [facility] (retail)       [address] [place], [place]  [address]       Ph: [phone]       Fax: [phone]   RxID:   [id number]    Immunizations Administered:  Tetanus Vaccine:    Vaccine Type: Td    Site: right deltoid    Mfr: Sanofi Pasteur    Dose: 0.5 ml    Route: IM    Given by: Ami Bullins CMA    Exp. Date: 10/24/2011    Lot #: [id number]    VIS given: 10/27/07 version given January 15, 2010.  Pneumonia Vaccine:    Vaccine Type: Pneumovax    Site: left deltoid    Mfr: Merck    Dose: 0.5 ml    Route: IM    Given by: Ami Bullins CMA    Exp. Date: 03/29/2011    Lot #: [id number]    VIS given: 07/06/96  version given January 15, 2010.

## 2011-01-11 ENCOUNTER — Telehealth: Payer: Self-pay | Admitting: Internal Medicine

## 2011-01-15 ENCOUNTER — Other Ambulatory Visit: Payer: Self-pay | Admitting: Dermatology

## 2011-01-16 NOTE — Progress Notes (Signed)
  Phone Note Refill Request Message from:  Fax from Pharmacy on January 11, 2011 8:19 AM  Refills Requested: Medication #1:  METOPROLOL SUCCINATE 50 MG XR24H-TAB once daily Initial call taken by: Ami Bullins CMA,  January 11, 2011 8:19 AM    Prescriptions: METOPROLOL SUCCINATE 50 MG XR24H-TAB (METOPROLOL SUCCINATE) once daily  #90 x 3   Entered by:   Ami Bullins CMA   Authorized by:   Jacques Navy MD   Signed by:   Ritchard Salinas CMA on 01/11/2011   Method used:   Faxed to ...       [facility] (mail-order)             , [place]         Ph: [phone]       Fax: [phone]   RxID:   [id number] METOPROLOL SUCCINATE 50 MG XR24H-TAB (METOPROLOL SUCCINATE) once daily  #90 x 3   Entered by:   Ami Bullins CMA   Authorized by:   Jacques Navy MD   Signed by:   Irineo Salinas CMA on 01/11/2011   Method used:   Print then Give to Patient   RxID:   [id number]

## 2011-04-23 ENCOUNTER — Other Ambulatory Visit: Payer: Self-pay | Admitting: Internal Medicine

## 2011-04-23 ENCOUNTER — Encounter (HOSPITAL_BASED_OUTPATIENT_CLINIC_OR_DEPARTMENT_OTHER): Payer: Medicare Other | Admitting: Internal Medicine

## 2011-04-23 DIAGNOSIS — C9110 Chronic lymphocytic leukemia of B-cell type not having achieved remission: Secondary | ICD-10-CM

## 2011-04-23 LAB — CBC WITH DIFFERENTIAL/PLATELET
EOS%: 0.6 % (ref 0.0–7.0)
HCT: 40.5 % (ref 38.4–49.9)
MCH: 32.3 pg (ref 27.2–33.4)
MCV: 94.3 fL (ref 79.3–98.0)
MONO#: 0.6 10*3/uL (ref 0.1–0.9)
MONO%: 2.5 % (ref 0.0–14.0)
NEUT%: 16.7 % — ABNORMAL LOW (ref 39.0–75.0)
RBC: 4.3 10*6/uL (ref 4.20–5.82)
RDW: 13.8 % (ref 11.0–14.6)

## 2011-04-23 LAB — COMPREHENSIVE METABOLIC PANEL
AST: 25 U/L (ref 0–37)
Alkaline Phosphatase: 74 U/L (ref 39–117)
BUN: 16 mg/dL (ref 6–23)
Creatinine, Ser: 0.94 mg/dL (ref 0.40–1.50)

## 2011-04-23 LAB — TECHNOLOGIST REVIEW

## 2011-06-11 ENCOUNTER — Encounter: Payer: Self-pay | Admitting: Internal Medicine

## 2011-06-13 ENCOUNTER — Other Ambulatory Visit (INDEPENDENT_AMBULATORY_CARE_PROVIDER_SITE_OTHER): Payer: Medicare Other

## 2011-06-13 ENCOUNTER — Ambulatory Visit (INDEPENDENT_AMBULATORY_CARE_PROVIDER_SITE_OTHER): Payer: Medicare Other | Admitting: Internal Medicine

## 2011-06-13 VITALS — BP 124/84 | HR 56 | Temp 98.2°F | Wt 183.0 lb

## 2011-06-13 DIAGNOSIS — G4733 Obstructive sleep apnea (adult) (pediatric): Secondary | ICD-10-CM

## 2011-06-13 DIAGNOSIS — I10 Essential (primary) hypertension: Secondary | ICD-10-CM

## 2011-06-13 DIAGNOSIS — E785 Hyperlipidemia, unspecified: Secondary | ICD-10-CM

## 2011-06-13 DIAGNOSIS — D7289 Other specified disorders of white blood cells: Secondary | ICD-10-CM

## 2011-06-13 DIAGNOSIS — Z0000 Encounter for general adult medical examination without abnormal findings: Secondary | ICD-10-CM

## 2011-06-13 DIAGNOSIS — Z136 Encounter for screening for cardiovascular disorders: Secondary | ICD-10-CM

## 2011-06-13 DIAGNOSIS — M1990 Unspecified osteoarthritis, unspecified site: Secondary | ICD-10-CM

## 2011-06-13 LAB — LIPID PANEL
HDL: 58.4 mg/dL (ref >39.00)
LDL Cholesterol: 67 mg/dL (ref 0–99)
VLDL: 17 mg/dL (ref 0.0–40.0)

## 2011-06-13 NOTE — Progress Notes (Signed)
Subjective:    Patient ID: Jerry Gomez, male    DOB: September 22, 1931, 77 y.o.   MRN: [id number]  HPI  The patient is here for annual Medicare wellness examination and management of other chronic and acute problems. Interval h/x is unremarkable for any major illness or injury. Had a basal cell carcinoma removed from behind the left ear and a squamous cell excised from the nose (Dr. Irene Limbo at [facility] derm).    The risk factors are reflected in the social history.  The roster of all physicians providing medical care to patient - is listed in the Snapshot section of the chart.  Activities of daily living:  The patient is 100% inedpendent in all ADLs: dressing, toileting, feeding as well as independent mobility  Home safety : The patient has smoke detectors in the home. They wear seatbelts. Firearms are present in the home, kept in a safe fashion. There is no violence in the home.   There is no risks for hepatitis, STDs or HIV. There is no history of blood transfusion. They have no travel history to infectious disease endemic areas of the world.  The patient has seen their dentist in the last six month. They have seen their eye doctor in the last year. They deny any hearing difficulty and have not had audiologic testing in the last year.  They do not  have excessive sun exposure. Discussed the need for sun protection: hats, long sleeves and use of sunscreen if there is significant sun exposure.   Diet: the importance of a healthy diet is discussed. They do have a healthy diet.  The patient does not have a regular exercise program; although he does play golf quite a bit and does all his yard work  The benefits of regular aerobic exercise were discussed.  Depression screen: there are no signs or vegative symptoms of depression- irritability, change in appetite, anhedonia, sadness/tearfullness.  Cognitive assessment: the patient manages all their financial and personal affairs and is actively engaged.  They could relate day,date,year and events; recalled 2/3 objects at 3 minutes; performed clock-face test normally-problem with hands placement for time selected.  The following portions of the patient's history were reviewed and updated as appropriate: allergies, current medications, past family history, past medical history,  past surgical history, past social history  and problem list.  Vision, hearing, body mass index were assessed and reviewed.   During the course of the visit the patient was educated and counseled about appropriate screening and preventive services including : fall prevention , diabetes screening, nutrition counseling, colorectal cancer screening, and recommended immunizations.    Review of Systems Review of Systems  Constitutional:  Negative for fever, chills, activity change and unexpected weight change.  HEENT:  Negative for hearing loss, ear pain, congestion, neck stiffness and postnasal drip. Negative for sore throat or swallowing problems. Negative for dental complaints.   Eyes: Negative for vision loss or change in visual acuity.  Respiratory: Negative for chest tightness and wheezing.   Cardiovascular: Negative for chest pain and palpitation. No decreased exercise tolerance Gastrointestinal: No change in bowel habit. No bloating or gas. No reflux or indigestion Genitourinary: Negative for urgency, frequency, flank pain and difficulty urinating.  Musculoskeletal: Negative for myalgias, back pain, arthralgias and gait problem.  Neurological: Negative for dizziness, tremors, weakness and headaches.  Hematological: Negative for adenopathy.  Psychiatric/Behavioral: Negative for behavioral problems and dysphoric mood.       Objective:   Physical Exam Vital signs reviewed and normal  Gen'l: Well nourished well developed white male in no acute distress and looking younger than his stated age.   HEENT:  Head: Normocephalic and atraumatic.  Right Ear: External ear  normal. EAC/TM nl Left Ear: External ear normal.  EAC/TM nl Nose: Nose normal.  Mouth/Throat: Oropharynx is clear and moist. Dentition - native, in good repair. No buccal or palatal lesions. Posterior pharynx clear. Eyes: Conjunctivae and sclera clear. EOM intact. Pupils are equal, round, and reactive to light. Right eye exhibits no discharge. Left eye exhibits no discharge. Fundiscopic exam deferred to opthalmology Neck: Normal range of motion. Neck supple. No JVD present. No tracheal deviation present. No thyromegaly present.  Cardiovascular: Normal rate, regular rhythm, no gallop, no friction rub, no murmur heard.      Quiet precordium. 2+ radial and DP pulses . No carotid bruits Pulmonary/Chest: Effort normal. No respiratory distress or increased WOB, no wheezes, no rales. No chest wall deformity or CVAT. Abdominal: Soft. Bowel sounds are normal in all quadrants. He exhibits no distension, no tenderness, no rebound or guarding, No heptosplenomegaly  Genitourinary:   Musculoskeletal: Normal range of motion. He exhibits no edema and no tenderness.       Small and large joints without redness, synovial thickening or deformity. Full range of motion preserved about all small, median and large joints.  Lymphadenopathy:    He has no cervical or supraclavicular adenopathy.  Neurological: He is alert and oriented to person, place, and time. CN II-XII intact. DTRs 2+ and symmetrical biceps, radial and patellar tendons. Cerebellar function normal with no tremor, rigidity, normal gait and station.  Skin: Skin is warm and dry. No rash noted. No erythema.  Psychiatric: He has a normal mood and affect. His behavior is normal. Thought content normal.   Lab Results  Component Value Date   WBC 22.4* 04/09/2010   HGB 13.9 04/23/2011   HCT 40.5 04/23/2011   PLT 143 04/23/2011   CHOL 142 06/13/2011   TRIG 85.0 06/13/2011   HDL 58.40 06/13/2011   ALT 25 04/23/2011   AST 25 04/23/2011   NA 138 04/23/2011   K 3.7  04/23/2011   CL 102 04/23/2011   CREATININE 0.94 04/23/2011   BUN 16 04/23/2011   CO2 29 04/23/2011   TSH 1.48 04/09/2010   PSA 6.18* 01/11/2008   Lab Results  Component Value Date   LDLCALC 67 06/13/2011            Assessment & Plan:

## 2011-06-14 NOTE — Assessment & Plan Note (Signed)
No complaints of progressive joint involvement or limitation in activities.

## 2011-06-14 NOTE — Assessment & Plan Note (Signed)
Patient followed by Dr. Arbutus Ped for CLL. His lab work in May was fine. He has had no anemia.  Plan - per Dr. Arbutus Ped who sees the patient every 6 months.

## 2011-06-14 NOTE — Assessment & Plan Note (Addendum)
Does well with CPAP. Last pulmonary note is Sept '09 with Dr. Craige Cotta.  Plan- to continue present therapy.          Consider follow-up visit to Dr. Craige Cotta for routine visit - question of need for re titration study.

## 2011-06-14 NOTE — Assessment & Plan Note (Signed)
Lipid panel today reveals excellent control.   Plan - continue present medical regimen

## 2011-06-14 NOTE — Assessment & Plan Note (Signed)
Interval medical history has been stable with no new problems. Physical exam is normal revealing a fit appearing man. Lab from May reviewed and there is no need to repeat studies at this time. Lipid panel was done as noted. He is current with colorectal cancer screening with last study Jan '05. Immunizations: Tdap Feb '11; Pneumonia vaccine Feb '11; shingles vaccine Jan '09. 12 lead EKG is normal with no evidence of ischemia or injury.  In summary- a very nice man who appears to be medically stable at today's visit. He will continue all his medications and health life-style practices. He will follow-up with Dr. Arbutus Ped as scheduled. He is asked to return as needed or in 1 year.

## 2011-06-14 NOTE — Assessment & Plan Note (Signed)
BP Readings from Last 3 Encounters:  06/13/11 124/84  11/07/10 120/70  09/10/10 122/68   Excellent control on present medications.  Plan - continue present medical regimen

## 2011-08-21 ENCOUNTER — Other Ambulatory Visit: Payer: Self-pay | Admitting: Dermatology

## 2011-08-28 ENCOUNTER — Ambulatory Visit (INDEPENDENT_AMBULATORY_CARE_PROVIDER_SITE_OTHER): Payer: Medicare Other | Admitting: *Deleted

## 2011-08-28 DIAGNOSIS — Z23 Encounter for immunization: Secondary | ICD-10-CM

## 2011-10-13 ENCOUNTER — Telehealth: Payer: Self-pay | Admitting: Internal Medicine

## 2011-10-13 NOTE — Telephone Encounter (Signed)
S/w pt re appt for 11/14 being cx'd due to epic. Pt also given new appt for 1/9 @ 1 pm.

## 2011-12-18 ENCOUNTER — Other Ambulatory Visit: Payer: Self-pay | Admitting: Internal Medicine

## 2011-12-18 ENCOUNTER — Other Ambulatory Visit: Payer: Medicare Other

## 2011-12-18 ENCOUNTER — Ambulatory Visit (HOSPITAL_BASED_OUTPATIENT_CLINIC_OR_DEPARTMENT_OTHER): Payer: Medicare Other | Admitting: Internal Medicine

## 2011-12-18 VITALS — BP 140/73 | HR 56 | Temp 96.8°F | Ht 65.0 in | Wt 180.7 lb

## 2011-12-18 DIAGNOSIS — C9110 Chronic lymphocytic leukemia of B-cell type not having achieved remission: Secondary | ICD-10-CM

## 2011-12-18 LAB — CBC WITH DIFFERENTIAL/PLATELET
BASO%: 0.3 % (ref 0.0–2.0)
LYMPH%: 82.9 % — ABNORMAL HIGH (ref 14.0–49.0)
MCHC: 33.7 g/dL (ref 32.0–36.0)
MCV: 95.3 fL (ref 79.3–98.0)
MONO%: 1.7 % (ref 0.0–14.0)
NEUT#: 4.1 10*3/uL (ref 1.5–6.5)
Platelets: 156 10*3/uL (ref 140–400)
RBC: 4.88 10*6/uL (ref 4.20–5.82)
RDW: 13.3 % (ref 11.0–14.6)
WBC: 28.6 10*3/uL — ABNORMAL HIGH (ref 4.0–10.3)

## 2011-12-18 LAB — COMPREHENSIVE METABOLIC PANEL
ALT: 29 U/L (ref 0–53)
AST: 26 U/L (ref 0–37)
Alkaline Phosphatase: 80 U/L (ref 39–117)
Creatinine, Ser: 1.04 mg/dL (ref 0.50–1.35)
Sodium: 140 mEq/L (ref 135–145)
Total Bilirubin: 1.6 mg/dL — ABNORMAL HIGH (ref 0.3–1.2)
Total Protein: 6.3 g/dL (ref 6.0–8.3)

## 2011-12-18 LAB — TECHNOLOGIST REVIEW

## 2011-12-18 NOTE — Progress Notes (Signed)
[facility] OFFICE PROGRESS NOTE  Illene Regulus, MD, MD [address] [facility] [address] [place] [address]  DIAGNOSIS: Stage 0 Chronic lymphocytic leukemia  PRIOR THERAPY: None  CURRENT THERAPY: Observation  INTERVAL HISTORY: Jerry Gomez 78 y.o. male returns to the clinic today for routine six-month followup visit. The patient is feeling fine and he denied having any significant complaints today. He has no significant weight loss or night sweats, no palpable lymphadenopathy, no chest pain or shortness of breath. No nausea or vomiting or change in bowel movement. He has repeat CBC performed earlier today and he is here for evaluation and discussion of his lab results.  MEDICAL HISTORY: Past Medical History  Diagnosis Date  . COLONIC POLYPS 03/02/2009  . EYE SURGERY, HX OF 09/05/2007  . HEMORRHOIDS, HX OF 09/05/2007  . HYPERLIPIDEMIA 09/05/2007  . HYPERTENSION 07/25/2007  . OSTEOARTHRITIS 09/05/2007  . Other abnormal blood chemistry 01/11/2008  . Other specified disease of white blood cells 09/25/2009  . SLEEP APNEA, OBSTRUCTIVE 09/05/2007    ALLERGIES:   has no known allergies.  MEDICATIONS:  Current Outpatient Prescriptions  Medication Sig Dispense Refill  . aspirin 325 MG tablet Take 325 mg by mouth daily.        [name] atorvastatin (LIPITOR) 20 MG tablet Take 20 mg by mouth daily.        . furosemide (LASIX) 40 MG tablet Take 40 mg by mouth daily.        . metoprolol (TOPROL-XL) 50 MG 24 hr tablet Take 50 mg by mouth daily.        . Multiple Vitamin (MULTIVITAMIN) tablet Take 1 tablet by mouth daily.        . indomethacin (INDOCIN) 25 MG capsule Take 25 mg by mouth 3 (three) times daily with meals. Prn       . sildenafil (VIAGRA) 100 MG tablet Take 100 mg by mouth daily as needed.          SURGICAL HISTORY:  Past Surgical History  Procedure Date  . Eye surgery     REVIEW OF SYSTEMS:  A comprehensive review of systems was negative.   PHYSICAL  EXAMINATION: General appearance: alert, cooperative and no distress Head: Normocephalic, without obvious abnormality, atraumatic Neck: no adenopathy Lymph nodes: Cervical, supraclavicular, and axillary nodes normal. Resp: clear to auscultation bilaterally Cardio: regular rate and rhythm, S1, S2 normal, no murmur, click, rub or gallop GI: soft, non-tender; bowel sounds normal; no masses,  no organomegaly Extremities: extremities normal, atraumatic, no cyanosis or edema Neurologic: Alert and oriented X 3, normal strength and tone. Normal symmetric reflexes. Normal coordination and gait  ECOG PERFORMANCE STATUS: 1 - Symptomatic but completely ambulatory  Blood pressure 140/73, pulse 56, temperature 96.8 F (36 C), temperature source Oral, height 5\' 5"  (1.651 m), weight 180 lb 11.2 oz (81.965 kg).  LABORATORY DATA: Lab Results  Component Value Date   WBC 28.6* 12/18/2011   HGB 15.7 12/18/2011   HCT 46.5 12/18/2011   MCV 95.3 12/18/2011   PLT 156 12/18/2011      Chemistry      Component Value Date/Time   NA 138 04/23/2011 1513   K 3.7 04/23/2011 1513   CL 102 04/23/2011 1513   CO2 29 04/23/2011 1513   BUN 16 04/23/2011 1513   CREATININE 0.94 04/23/2011 1513      Component Value Date/Time   CALCIUM 8.8 04/23/2011 1513   ALKPHOS 74 04/23/2011 1513   AST  25 04/23/2011 1513   ALT 25 04/23/2011 1513   BILITOT 1.0 04/23/2011 1513       ASSESSMENT: This is a very pleasant 78 years old white male with chronic lymphocytic leukemia currently on observation. The patient has some activation his white blood count since last lab in 6 months, but no doubling of his count. I discussed the lab result with the patient today and he is currently asymptomatic  PLAN: I recommended for him continuous observation with repeat CBC, comprehensive metabolic panel and LDH in 6 months. He was advised to call immediately she has any concerning symptoms in the interim.  All questions were answered. The patient knows to call  the clinic with any problems, questions or concerns. We can certainly see the patient much sooner if necessary.

## 2012-01-08 ENCOUNTER — Telehealth: Payer: Self-pay | Admitting: Internal Medicine

## 2012-01-08 NOTE — Telephone Encounter (Signed)
Pt requesting Metoprolol Er  50 mg--refill per pt no more refill--[facility]  90 day supply--

## 2012-01-09 MED ORDER — METOPROLOL SUCCINATE ER 50 MG PO TB24: 50.0000 mg | ORAL_TABLET | Freq: Every day | ORAL | Status: DC

## 2012-01-09 NOTE — Telephone Encounter (Signed)
Done. Pt informed.

## 2012-02-07 ENCOUNTER — Ambulatory Visit: Payer: Medicare Other

## 2012-02-07 ENCOUNTER — Ambulatory Visit (INDEPENDENT_AMBULATORY_CARE_PROVIDER_SITE_OTHER): Payer: Medicare Other | Admitting: Internal Medicine

## 2012-02-07 ENCOUNTER — Other Ambulatory Visit (INDEPENDENT_AMBULATORY_CARE_PROVIDER_SITE_OTHER): Payer: Medicare Other

## 2012-02-07 ENCOUNTER — Telehealth: Payer: Self-pay

## 2012-02-07 ENCOUNTER — Encounter: Payer: Self-pay | Admitting: Internal Medicine

## 2012-02-07 VITALS — BP 130/82 | HR 60 | Temp 97.9°F | Resp 16 | Wt 180.0 lb

## 2012-02-07 DIAGNOSIS — R609 Edema, unspecified: Secondary | ICD-10-CM | POA: Insufficient documentation

## 2012-02-07 DIAGNOSIS — M79661 Pain in right lower leg: Secondary | ICD-10-CM

## 2012-02-07 DIAGNOSIS — R7989 Other specified abnormal findings of blood chemistry: Secondary | ICD-10-CM

## 2012-02-07 DIAGNOSIS — R600 Localized edema: Secondary | ICD-10-CM | POA: Insufficient documentation

## 2012-02-07 DIAGNOSIS — I739 Peripheral vascular disease, unspecified: Secondary | ICD-10-CM | POA: Diagnosis not present

## 2012-02-07 DIAGNOSIS — M79609 Pain in unspecified limb: Secondary | ICD-10-CM

## 2012-02-07 DIAGNOSIS — E785 Hyperlipidemia, unspecified: Secondary | ICD-10-CM

## 2012-02-07 DIAGNOSIS — D7289 Other specified disorders of white blood cells: Secondary | ICD-10-CM | POA: Diagnosis not present

## 2012-02-07 DIAGNOSIS — I10 Essential (primary) hypertension: Secondary | ICD-10-CM

## 2012-02-07 LAB — COMPREHENSIVE METABOLIC PANEL
ALT: 28 U/L (ref 0–53)
AST: 25 U/L (ref 0–37)
Creatinine, Ser: 0.8 mg/dL (ref 0.4–1.5)
Total Bilirubin: 1.8 mg/dL — ABNORMAL HIGH (ref 0.3–1.2)

## 2012-02-07 LAB — LIPID PANEL
HDL: 58.7 mg/dL (ref >39.00)
LDL Cholesterol: 78 mg/dL (ref 0–99)
Total CHOL/HDL Ratio: 3
Triglycerides: 60 mg/dL (ref 0.0–149.0)
VLDL: 12 mg/dL (ref 0.0–40.0)

## 2012-02-07 MED ORDER — TRAMADOL HCL 50 MG PO TABS: 50.0000 mg | ORAL_TABLET | Freq: Three times a day (TID) | ORAL | Status: AC | PRN

## 2012-02-07 NOTE — Patient Instructions (Signed)
Peripheral Vascular Disease Peripheral Vascular Disease (PVD), also called Peripheral Arterial Disease (PAD), is a circulation problem caused by cholesterol (atherosclerotic plaque) deposits in the arteries. PVD commonly occurs in the lower extremities (legs) but it can occur in other areas of the body, such as your arms. The cholesterol buildup in the arteries reduces blood flow which can cause pain and other serious problems. The presence of PVD can place a person at risk for Coronary Artery Disease (CAD).  CAUSES  Causes of PVD can be many. It is usually associated with more than one risk factor such as:   High Cholesterol.   Smoking.   Diabetes.   Lack of exercise or inactivity.   High blood pressure (hypertension).   Obesity.   Family history.  SYMPTOMS   When the lower extremities are affected, patients with PVD may experience:   Leg pain with exertion or physical activity. This is called INTERMITTENT CLAUDICATION. This may present as cramping or numbness with physical activity. The location of the pain is associated with the level of blockage. For example, blockage at the abdominal level (distal abdominal aorta) may result in buttock or hip pain. Lower leg arterial blockage may result in calf pain.   As PVD becomes more severe, pain can develop with less physical activity.   In people with severe PVD, leg pain may occur at rest.   Other PVD signs and symptoms:   Leg numbness or weakness.   Coldness in the affected leg or foot, especially when compared to the other leg.   A change in leg color.   Patients with significant PVD are more prone to ulcers or sores on toes, feet or legs. These may take longer to heal or may reoccur. The ulcers or sores can become infected.   If signs and symptoms of PVD are ignored, gangrene may occur. This can result in the loss of toes or loss of an entire limb.   Not all leg pain is related to PVD. Other medical conditions can cause leg  pain such as:   Blood clots (embolism) or Deep Vein Thrombosis.   Inflammation of the blood vessels (vasculitis).   Spinal stenosis.  DIAGNOSIS  Diagnosis of PVD can involve several different types of tests. These can include:  Pulse Volume Recording Method (PVR). This test is simple, painless and does not involve the use of X-rays. PVR involves measuring and comparing the blood pressure in the arms and legs. An ABI (Ankle-Brachial Index) is calculated. The normal ratio of blood pressures is 1. As this number becomes smaller, it indicates more severe disease.   < 0.95 - indicates significant narrowing in one or more leg vessels.   <0.8 - there will usually be pain in the foot, leg or buttock with exercise.   <0.4 - will usually have pain in the legs at rest.   <0.25 - usually indicates limb threatening PVD.   Doppler detection of pulses in the legs. This test is painless and checks to see if you have a pulses in your legs/feet.   A dye or contrast material (a substance that highlights the blood vessels so they show up on x-ray) may be given to help your caregiver better see the arteries for the following tests. The dye is eliminated from your body by the kidney's. Your caregiver may order blood work to check your kidney function and other laboratory values before the following tests are performed:   Magnetic Resonance Angiography (MRA). An MRA is a picture   study of the blood vessels and arteries. The MRA machine uses a large magnet to produce images of the blood vessels.   Computed Tomography Angiography (CTA). A CTA is a specialized x-ray that looks at how the blood flows in your blood vessels. An IV may be inserted into your arm so contrast dye can be injected.   Angiogram. Is a procedure that uses x-rays to look at your blood vessels. This procedure is minimally invasive, meaning a small incision (cut) is made in your groin. A small tube (catheter) is then inserted into the artery of  your groin. The catheter is guided to the blood vessel or artery your caregiver wants to examine. Contrast dye is injected into the catheter. X-rays are then taken of the blood vessel or artery. After the images are obtained, the catheter is taken out.  TREATMENT  Treatment of PVD involves many interventions which may include:  Lifestyle changes:   Quitting smoking.   Exercise.   Following a low fat, low cholesterol diet.   Control of diabetes.   Foot care is very important to the PVD patient. Good foot care can help prevent infection.   Medication:   Cholesterol-lowering medicine.   Blood pressure medicine.   Anti-platelet drugs.   Certain medicines may reduce symptoms of Intermittent Claudication.   Interventional/Surgical options:   Angioplasty. An Angioplasty is a procedure that inflates a balloon in the blocked artery. This opens the blocked artery to improve blood flow.   Stent Implant. A wire mesh tube (stent) is placed in the artery. The stent expands and stays in place, allowing the artery to remain open.   Peripheral Bypass Surgery. This is a surgical procedure that reroutes the blood around a blocked artery to help improve blood flow. This type of procedure may be performed if Angioplasty or stent implants are not an option.  SEEK IMMEDIATE MEDICAL CARE IF:   You develop pain or numbness in your arms or legs.   Your arm or leg turns cold, becomes blue in color.   You develop redness, warmth, swelling and pain in your arms or legs.  MAKE SURE YOU:   Understand these instructions.   Will watch your condition.   Will get help right away if you are not doing well or get worse.  Document Released: 01/02/2005 Document Revised: 08/07/2011 Document Reviewed: 11/29/2008 [employer] Patient Information 2012 [employer]. 

## 2012-02-07 NOTE — Assessment & Plan Note (Addendum)
Labs today to look for dvt, ischemia, etc, also have asked him to get an arterial blood flow done, he can take tramadol as needed for pain

## 2012-02-07 NOTE — Assessment & Plan Note (Signed)
I am concerned that he has pain at rest from PVD so I have asked him to get an arterial flow study done asap

## 2012-02-07 NOTE — Assessment & Plan Note (Signed)
FLP and CMP today 

## 2012-02-07 NOTE — Progress Notes (Signed)
Subjective:    Patient ID: Jerry Gomez, male    DOB: [birth year], 78 y.o.   MRN: [id number]  HPI New to me he complains of a 2 week history of a vague pain that radiates around and between his right posterior thigh, knee, and calf. He has no hx of trauma or injury and tells me that he played a round of golf yesterday but the pain was bad thru the night so he came in today, he has not taken anything for pain. He has mild swelling in both legs. He does not describe any claudication.  Review of Systems  Constitutional: Negative for fever, chills, diaphoresis, activity change, appetite change, fatigue and unexpected weight change.  HENT: Negative.   Eyes: Negative.   Respiratory: Negative for apnea, cough, choking, chest tightness, shortness of breath, wheezing and stridor.   Cardiovascular: Positive for leg swelling. Negative for chest pain and palpitations.  Gastrointestinal: Negative for nausea, vomiting, abdominal pain, diarrhea, blood in stool, abdominal distention and anal bleeding.  Genitourinary: Negative.   Musculoskeletal: Positive for arthralgias. Negative for myalgias, back pain, joint swelling and gait problem.  Skin: Negative for color change, pallor and wound.  Neurological: Negative for dizziness, tremors, seizures, syncope, facial asymmetry, speech difficulty, weakness, light-headedness, numbness and headaches.  Hematological: Negative for adenopathy. Does not bruise/bleed easily.  Psychiatric/Behavioral: Negative.        Objective:   Physical Exam  Vitals reviewed. Constitutional: He is oriented to person, place, and time. He appears well-developed and well-nourished. No distress.  HENT:  Head: Normocephalic and atraumatic.  Mouth/Throat: Oropharynx is clear and moist. No oropharyngeal exudate.  Eyes: Conjunctivae are normal. Right eye exhibits no discharge. Left eye exhibits no discharge. No scleral icterus.  Neck: Normal range of motion. Neck supple. No JVD present. No  tracheal deviation present. No thyromegaly present.  Cardiovascular: Normal rate, regular rhythm, S1 normal, S2 normal and normal heart sounds.  Exam reveals decreased pulses. Exam reveals no gallop and no friction rub.   No murmur heard.  No systolic murmur is present   No diastolic murmur is present  Pulses:      Carotid pulses are 1+ on the right side, and 1+ on the left side.      Radial pulses are 1+ on the right side, and 1+ on the left side.       Femoral pulses are 1+ on the right side, and 1+ on the left side.      Popliteal pulses are 0 on the right side, and 1+ on the left side.       Dorsalis pedis pulses are 0 on the right side, and 1+ on the left side.       Posterior tibial pulses are 0 on the right side, and 1+ on the left side.  Pulmonary/Chest: Effort normal and breath sounds normal. No stridor. No respiratory distress. He has no wheezes. He has no rales. He exhibits no tenderness.  Abdominal: Soft. Bowel sounds are normal. He exhibits no distension and no mass. There is no tenderness. There is no rebound and no guarding.  Musculoskeletal: He exhibits edema (trace edema in both legs). He exhibits no tenderness.       Right hip: Normal. He exhibits normal range of motion, normal strength, no tenderness, no bony tenderness, no swelling, no crepitus, no deformity and no laceration.       Right knee: Normal. He exhibits normal range of motion, no swelling, no effusion, no ecchymosis, no deformity,  no laceration, no erythema, normal alignment, no LCL laxity, no bony tenderness, normal meniscus and no MCL laxity. no tenderness found.       Right foot has trace pulses in DP, PT, popliteal but there is decent capillary refill distally though the capillary is not as brisk as the left foot.  Lymphadenopathy:    He has no cervical adenopathy.  Neurological: He is alert and oriented to person, place, and time. He has normal reflexes. He displays normal reflexes. No cranial nerve deficit. He  exhibits normal muscle tone. Coordination normal.       - SLR in both legs  Skin: Skin is warm and dry. No rash noted. He is not diaphoretic. No erythema. No pallor.  Psychiatric: He has a normal mood and affect. His behavior is normal. Judgment and thought content normal.     Lab Results  Component Value Date   WBC 28.6* 12/18/2011   HGB 15.7 12/18/2011   HCT 46.5 12/18/2011   PLT 156 12/18/2011   GLUCOSE 83 12/18/2011   CHOL 142 06/13/2011   TRIG 85.0 06/13/2011   HDL 58.40 06/13/2011   LDLCALC 67 06/13/2011   ALT 29 12/18/2011   AST 26 12/18/2011   NA 140 12/18/2011   K 4.4 12/18/2011   CL 101 12/18/2011   CREATININE 1.04 12/18/2011   BUN 14 12/18/2011   CO2 28 12/18/2011   TSH 1.48 04/09/2010   PSA 6.18* 01/11/2008       Assessment & Plan:

## 2012-02-07 NOTE — Telephone Encounter (Signed)
Patient request personal call back from MD, did not mention why only stated that he needs to discuss something

## 2012-02-07 NOTE — Assessment & Plan Note (Addendum)
I will check labs to look for secondary causes, DVT, etc

## 2012-02-07 NOTE — Assessment & Plan Note (Signed)
His BP is well controlled, I will check his lytes and renal function 

## 2012-02-08 LAB — CBC WITH DIFFERENTIAL/PLATELET
Basophils Relative: 0 % (ref 0–1)
MCH: 31.2 pg (ref 26.0–34.0)
MCV: 94.1 fL (ref 78.0–100.0)
Monocytes Absolute: 1.1 10*3/uL — ABNORMAL HIGH (ref 0.1–1.0)
Monocytes Relative: 3 % (ref 3–12)
Platelets: 168 10*3/uL (ref 150–400)
RDW: 13.8 % (ref 11.5–15.5)

## 2012-02-21 ENCOUNTER — Encounter (INDEPENDENT_AMBULATORY_CARE_PROVIDER_SITE_OTHER): Payer: Medicare Other

## 2012-02-21 ENCOUNTER — Telehealth: Payer: Self-pay | Admitting: Internal Medicine

## 2012-02-21 DIAGNOSIS — R600 Localized edema: Secondary | ICD-10-CM

## 2012-02-21 DIAGNOSIS — M79661 Pain in right lower leg: Secondary | ICD-10-CM

## 2012-02-21 DIAGNOSIS — I739 Peripheral vascular disease, unspecified: Secondary | ICD-10-CM

## 2012-02-21 MED ORDER — FUROSEMIDE 40 MG PO TABS: 40.0000 mg | ORAL_TABLET | Freq: Every day | ORAL | Status: DC

## 2012-02-21 MED ORDER — ATORVASTATIN CALCIUM 20 MG PO TABS: 20.0000 mg | ORAL_TABLET | Freq: Every day | ORAL | Status: DC

## 2012-02-21 NOTE — Telephone Encounter (Signed)
The patient is requesting Lipitor 20mg  and Lasix 40mg  (both generic) sent to the [facility] at [place].  (he didn't tell me about the lasix earlier today!!)   Thank you soooo much!

## 2012-03-02 ENCOUNTER — Ambulatory Visit (INDEPENDENT_AMBULATORY_CARE_PROVIDER_SITE_OTHER): Payer: Medicare Other

## 2012-03-02 VITALS — BP 120/64 | HR 69

## 2012-03-02 DIAGNOSIS — I10 Essential (primary) hypertension: Secondary | ICD-10-CM | POA: Diagnosis not present

## 2012-03-11 ENCOUNTER — Telehealth: Payer: Self-pay

## 2012-03-11 DIAGNOSIS — L608 Other nail disorders: Secondary | ICD-10-CM | POA: Diagnosis not present

## 2012-03-11 DIAGNOSIS — L821 Other seborrheic keratosis: Secondary | ICD-10-CM | POA: Diagnosis not present

## 2012-03-11 DIAGNOSIS — Z85828 Personal history of other malignant neoplasm of skin: Secondary | ICD-10-CM | POA: Diagnosis not present

## 2012-03-11 DIAGNOSIS — L723 Sebaceous cyst: Secondary | ICD-10-CM | POA: Diagnosis not present

## 2012-03-11 NOTE — Telephone Encounter (Signed)
Patient called requesting most recent lab results Thanks

## 2012-03-12 ENCOUNTER — Encounter: Payer: Self-pay | Admitting: Internal Medicine

## 2012-03-12 ENCOUNTER — Ambulatory Visit (INDEPENDENT_AMBULATORY_CARE_PROVIDER_SITE_OTHER)
Admission: RE | Admit: 2012-03-12 | Discharge: 2012-03-12 | Disposition: A | Discharge status: Home / Self Care | Payer: Medicare Other | Source: Ambulatory Visit | Attending: Internal Medicine | Admitting: Internal Medicine

## 2012-03-12 ENCOUNTER — Ambulatory Visit (INDEPENDENT_AMBULATORY_CARE_PROVIDER_SITE_OTHER): Payer: Medicare Other | Admitting: Internal Medicine

## 2012-03-12 VITALS — BP 120/74 | HR 64 | Temp 97.6°F | Resp 16 | Wt 179.2 lb

## 2012-03-12 DIAGNOSIS — M79609 Pain in unspecified limb: Secondary | ICD-10-CM

## 2012-03-12 DIAGNOSIS — M79661 Pain in right lower leg: Secondary | ICD-10-CM

## 2012-03-12 DIAGNOSIS — M47817 Spondylosis without myelopathy or radiculopathy, lumbosacral region: Secondary | ICD-10-CM | POA: Diagnosis not present

## 2012-03-12 DIAGNOSIS — M79604 Pain in right leg: Secondary | ICD-10-CM

## 2012-03-12 DIAGNOSIS — M4120 Other idiopathic scoliosis, site unspecified: Secondary | ICD-10-CM | POA: Diagnosis not present

## 2012-03-12 MED ORDER — ATORVASTATIN CALCIUM 20 MG PO TABS: 20.0000 mg | ORAL_TABLET | Freq: Every day | ORAL | Status: DC

## 2012-03-12 MED ORDER — FUROSEMIDE 40 MG PO TABS: 40.0000 mg | ORAL_TABLET | Freq: Every day | ORAL | Status: DC

## 2012-03-12 NOTE — Patient Instructions (Signed)
Right leg pain - distally (calve): no evidence of blood clot, normal circulation, no evidence of joint inflammation. Exam with normal sensation, reflexes and strength. Suspect possible degenerative disk disease with nerve impingement L5-S1.  Plan - plain x-rays of the spine. If there is indirect evidence of loss of disk height - will move to MRI           For the pain try taking aleve twice a day. Watch out for stomach irritation.

## 2012-03-12 NOTE — Telephone Encounter (Signed)
His WBC is higher than before but other labs look good

## 2012-03-12 NOTE — Telephone Encounter (Signed)
He should see a hematologist

## 2012-03-12 NOTE — Telephone Encounter (Signed)
Is there anything pt needs to do or if he needs to follow up with PCP

## 2012-03-15 NOTE — Assessment & Plan Note (Addendum)
Patient with continued right leg pain. No frank radicular symptoms except for pain and some discomfort with heel walk.  Plan - L-S spine films for possible DDD with recommendations to follow.   Addendum: LUMBAR SPINE - COMPLETE 4+ VIEW  Comparison: None.  Findings: Five views of the lumbar spine submitted. No acute  fracture or subluxation. Mild dextroscoliosis. Mild anterior  spurring noted upper endplate of L3 and L4 vertebral body. Mild  disc space flattening at L3-L4 L4-L5 and L5-S1 level. Mild facet  degenerative changes L4 and L5 level.  IMPRESSION:  No acute fracture or subluxation. Mild dextroscoliosis. Mild  degenerative changes as described above.   Plan - if pain persists and is not relieved with NSAIDs treatment will move to MRI for possible nerve impingement amenable to [facility]

## 2012-03-15 NOTE — Progress Notes (Signed)
  Subjective:    Patient ID: Jerry Gomez, male    DOB: February 17, 1931, 78 y.o.   MRN: [id number]  HPI Mr. Martucci presents for continued right leg pain that includes pain in the proximal thigh to the area of the knee with radiation to the foot. He has seen Dr. Yetta Barre for this problem recently. He had lower extremity arterial doppler that was normal. He had ESR that was normal at 5, D-dimer normal at 0.24, normal lactic acid level. He continues to have pain and some limitation in ambulation.  Lipid labs were obtained with his other labs 02/07/12 - well controlled.  PMH, FamHx and SocHx reviewed for any changes and relevance.    Review of Systems System review is negative for any constitutional, cardiac, pulmonary, GI or neuro symptoms or complaints other than as described in the HPI.     Objective:   Physical Exam Filed Vitals:   03/12/12 0955  BP: 120/74  Pulse: 64  Temp: 97.6 F (36.4 C)  Resp: 16   Gen'l- well nourished pleasant white man in no acute distress Pulm - normal respirations Cor - 2+ radial and DP pulses, RRR Back exam: normal stand; normal flex to greater than 100 degrees; normal gait; normal toe walk/abnormal heel walk with pain in the leg and buttock; normal step up to exam table; normal SLR sitting; normal DTRs at the patellar tendons; normal sensation to light touch, pin-prick and deep vibratory stimulus; no  CVA tenderness; able to move supine to sitting witout assistance.        Assessment & Plan:

## 2012-03-18 NOTE — Telephone Encounter (Signed)
Patient seen in office 03/12/12

## 2012-03-19 ENCOUNTER — Encounter: Payer: Self-pay | Admitting: Internal Medicine

## 2012-04-20 ENCOUNTER — Telehealth: Payer: Self-pay | Admitting: Internal Medicine

## 2012-04-20 NOTE — Telephone Encounter (Signed)
pt called in to r/s 7/10 to 7/2   aom

## 2012-05-29 DIAGNOSIS — H35379 Puckering of macula, unspecified eye: Secondary | ICD-10-CM | POA: Diagnosis not present

## 2012-05-29 DIAGNOSIS — H35359 Cystoid macular degeneration, unspecified eye: Secondary | ICD-10-CM | POA: Diagnosis not present

## 2012-06-09 ENCOUNTER — Other Ambulatory Visit (HOSPITAL_BASED_OUTPATIENT_CLINIC_OR_DEPARTMENT_OTHER): Payer: Medicare Other | Admitting: Lab

## 2012-06-09 ENCOUNTER — Telehealth: Payer: Self-pay | Admitting: Internal Medicine

## 2012-06-09 ENCOUNTER — Ambulatory Visit (HOSPITAL_BASED_OUTPATIENT_CLINIC_OR_DEPARTMENT_OTHER): Payer: Medicare Other | Admitting: Internal Medicine

## 2012-06-09 VITALS — BP 144/73 | HR 51 | Temp 97.0°F | Ht 65.0 in | Wt 174.6 lb

## 2012-06-09 DIAGNOSIS — C9110 Chronic lymphocytic leukemia of B-cell type not having achieved remission: Secondary | ICD-10-CM

## 2012-06-09 LAB — COMPREHENSIVE METABOLIC PANEL
Albumin: 4.2 g/dL (ref 3.5–5.2)
Alkaline Phosphatase: 76 U/L (ref 39–117)
BUN: 13 mg/dL (ref 6–23)
Glucose, Bld: 89 mg/dL (ref 70–99)
Potassium: 4.6 mEq/L (ref 3.5–5.3)
Total Bilirubin: 1.5 mg/dL — ABNORMAL HIGH (ref 0.3–1.2)

## 2012-06-09 LAB — CBC WITH DIFFERENTIAL/PLATELET
Basophils Absolute: 0 10*3/uL (ref 0.0–0.1)
Eosinophils Absolute: 0.2 10*3/uL (ref 0.0–0.5)
HCT: 44.9 % (ref 38.4–49.9)
HGB: 14.9 g/dL (ref 13.0–17.1)
LYMPH%: 87.8 % — ABNORMAL HIGH (ref 14.0–49.0)
MCV: 95.7 fL (ref 79.3–98.0)
MONO%: 1.3 % (ref 0.0–14.0)
NEUT#: 2.8 10*3/uL (ref 1.5–6.5)
NEUT%: 10 % — ABNORMAL LOW (ref 39.0–75.0)
Platelets: 142 10*3/uL (ref 140–400)

## 2012-06-09 LAB — TECHNOLOGIST REVIEW

## 2012-06-09 NOTE — Telephone Encounter (Signed)
gve the pt his jan 2014 appt calendar °

## 2012-06-09 NOTE — Progress Notes (Signed)
[facility] [phone]   [phone]  OFFICE PROGRESS NOTE  Illene Regulus, MD [address] [place] [address]  DIAGNOSIS: Stage 0 Chronic lymphocytic leukemia   PRIOR THERAPY: None   CURRENT THERAPY: Observation  INTERVAL HISTORY: Jerry Gomez 78 y.o. male returns to the clinic today for routine six-month followup visit. The patient is feeling fine today with no specific complaints. He denied having any significant weight loss or night sweats, no palpable lymphadenopathy. He has no chest pain or shortness breath, no cough or hemoptysis. No bleeding or bruises. The patient has repeat CBC performed earlier today and he is here for evaluation and discussion of his lab results.  MEDICAL HISTORY: Past Medical History  Diagnosis Date  . COLONIC POLYPS 03/02/2009  . EYE SURGERY, HX OF 09/05/2007  . HEMORRHOIDS, HX OF 09/05/2007  . HYPERLIPIDEMIA 09/05/2007  . HYPERTENSION 07/25/2007  . OSTEOARTHRITIS 09/05/2007  . Other abnormal blood chemistry 01/11/2008  . Other specified disease of white blood cells 09/25/2009  . SLEEP APNEA, OBSTRUCTIVE 09/05/2007    ALLERGIES:   has no known allergies.  MEDICATIONS:  Current Outpatient Prescriptions  Medication Sig Dispense Refill  . aspirin 325 MG tablet Take 325 mg by mouth daily.        [name] atorvastatin (LIPITOR) 20 MG tablet Take 1 tablet (20 mg total) by mouth daily.  90 tablet  3  . furosemide (LASIX) 40 MG tablet Take 1 tablet (40 mg total) by mouth daily.  90 tablet  3  . metoprolol succinate (TOPROL-XL) 50 MG 24 hr tablet Take 1 tablet (50 mg total) by mouth daily.  90 tablet  2  . Multiple Vitamin (MULTIVITAMIN) tablet Take 1 tablet by mouth daily.        . sildenafil (VIAGRA) 100 MG tablet Take 100 mg by mouth daily as needed.        . indomethacin (INDOCIN) 25 MG capsule Take 25 mg by mouth 3 (three) times daily with meals. Prn         SURGICAL HISTORY:  Past Surgical History  Procedure  Date  . Eye surgery     REVIEW OF SYSTEMS:  A comprehensive review of systems was negative.   PHYSICAL EXAMINATION: General appearance: alert, cooperative and no distress Neck: no adenopathy Lymph nodes: Cervical, supraclavicular, and axillary nodes normal. Resp: clear to auscultation bilaterally Cardio: regular rate and rhythm, S1, S2 normal, no murmur, click, rub or gallop GI: soft, non-tender; bowel sounds normal; no masses,  no organomegaly Extremities: extremities normal, atraumatic, no cyanosis or edema Neurologic: Alert and oriented X 3, normal strength and tone. Normal symmetric reflexes. Normal coordination and gait  ECOG PERFORMANCE STATUS: 0 - Asymptomatic  Blood pressure 144/73, pulse 51, temperature 97 F (36.1 C), temperature source Oral, height 5\' 5"  (1.651 m), weight 174 lb 9.6 oz (79.198 kg).  LABORATORY DATA: Lab Results  Component Value Date   WBC 28.5* 06/09/2012   HGB 14.9 06/09/2012   HCT 44.9 06/09/2012   MCV 95.7 06/09/2012   PLT 142 06/09/2012      Chemistry      Component Value Date/Time   NA 141 02/07/2012 1059   K 4.4 02/07/2012 1059   CL 104 02/07/2012 1059   CO2 30 02/07/2012 1059   BUN 13 02/07/2012 1059   CREATININE 0.8 02/07/2012 1059      Component Value Date/Time   CALCIUM 9.6 02/07/2012 1059   ALKPHOS 85 02/07/2012 1059  AST 25 02/07/2012 1059   ALT 28 02/07/2012 1059   BILITOT 1.8* 02/07/2012 1059       RADIOGRAPHIC STUDIES: No results found.  ASSESSMENT: This is a very pleasant 78 years old white male with history of stage 0 chronic lymphocytic leukemia. He is currently on observation with no significant evidence for disease progression.  PLAN: I discussed the lab result with the patient today. I recommended for him continuous observation with repeat CBC, comprehensive metabolic panel and LDH in 6 months. He would come back for followup visit at that time.  All questions were answered. The patient knows to call the clinic with any problems, questions  or concerns. We can certainly see the patient much sooner if necessary.

## 2012-06-16 ENCOUNTER — Encounter: Payer: Medicare Other | Admitting: Internal Medicine

## 2012-06-16 ENCOUNTER — Ambulatory Visit: Payer: Medicare Other | Admitting: Internal Medicine

## 2012-06-17 ENCOUNTER — Other Ambulatory Visit: Payer: Medicare Other | Admitting: Lab

## 2012-06-17 ENCOUNTER — Ambulatory Visit: Payer: Medicare Other | Admitting: Internal Medicine

## 2012-06-29 ENCOUNTER — Encounter: Payer: Self-pay | Admitting: Internal Medicine

## 2012-06-29 ENCOUNTER — Ambulatory Visit (INDEPENDENT_AMBULATORY_CARE_PROVIDER_SITE_OTHER): Payer: Medicare Other | Admitting: Internal Medicine

## 2012-06-29 VITALS — BP 112/62 | HR 59 | Temp 97.7°F | Resp 16 | Wt 175.0 lb

## 2012-06-29 DIAGNOSIS — R609 Edema, unspecified: Secondary | ICD-10-CM

## 2012-06-29 DIAGNOSIS — I10 Essential (primary) hypertension: Secondary | ICD-10-CM

## 2012-06-29 DIAGNOSIS — E785 Hyperlipidemia, unspecified: Secondary | ICD-10-CM

## 2012-06-29 DIAGNOSIS — Z0000 Encounter for general adult medical examination without abnormal findings: Secondary | ICD-10-CM

## 2012-06-29 DIAGNOSIS — R600 Localized edema: Secondary | ICD-10-CM

## 2012-06-29 DIAGNOSIS — C9110 Chronic lymphocytic leukemia of B-cell type not having achieved remission: Secondary | ICD-10-CM

## 2012-06-29 DIAGNOSIS — M79609 Pain in unspecified limb: Secondary | ICD-10-CM

## 2012-06-29 DIAGNOSIS — M79661 Pain in right lower leg: Secondary | ICD-10-CM

## 2012-06-29 NOTE — Assessment & Plan Note (Signed)
chronic distal LE edema. No secondary issues: no evidence of cardiac or renal disease. Suspect mild venous insufficiency.

## 2012-06-29 NOTE — Assessment & Plan Note (Signed)
LDL 78 well below goal of 604 or less. HDL is also very good.  Plan - continue present regimen

## 2012-06-29 NOTE — Assessment & Plan Note (Signed)
Followed every 6 months by Dr. Arbutus Ped at [facility] - last WBC July '12 28.5

## 2012-06-29 NOTE — Progress Notes (Signed)
Subjective:    Patient ID: Jerry Gomez, male    DOB: 07/09/1931, 78 y.o.   MRN: [id number]  HPI The patient is here for annual Medicare wellness examination and management of other chronic and acute problems.  CC: he has some minor leg pain that is worse at night and he will have some leg weakness when first starting to ambulate. No limitations in activity.    The risk factors are reflected in the social history.  The roster of all physicians providing medical care to patient - is listed in the Snapshot section of the chart.  Activities of daily living:  The patient is 100% inedpendent in all ADLs: dressing, toileting, feeding as well as independent mobility  Home safety : The patient has smoke detectors in the home. Falls - reasonably safe. They wear seatbelts.  firearms are present in the home, kept in a safe fashion. There is no violence in the home.   There is no risks for hepatitis, STDs or HIV. There is no   history of blood transfusion. They have no travel history to infectious disease endemic areas of the world.  The patient has seen their dentist in the last six month. They have seen their eye doctor in the last year. They deny any hearing difficulty and have not had audiologic testing in the last year.  They do not  have excessive sun exposure. Discussed the need for sun protection: hats, long sleeves and use of sunscreen if there is significant sun exposure.   Diet: the importance of a healthy diet is discussed. They do have a healthy  diet.  The patient has a regular exercise program: golf 2-3 /wk , 1 hr duration, 2-3 per week.  The benefits of regular aerobic exercise were discussed.  Depression screen: there are no signs or vegative symptoms of depression- irritability, change in appetite, anhedonia, sadness/tearfullness.  Cognitive assessment: the patient manages all their financial and personal affairs and is actively engaged.  The following portions of the patient's  history were reviewed and updated as appropriate: allergies, current medications, past family history, past medical history,  past surgical history, past social history  and problem list.  Vision, hearing, body mass index were assessed and reviewed.   During the course of the visit the patient was educated and counseled about appropriate screening and preventive services including : fall prevention , diabetes screening, nutrition counseling, colorectal cancer screening, and recommended immunizations.  Past Medical History  Diagnosis Date  . COLONIC POLYPS 03/02/2009  . EYE SURGERY, HX OF 09/05/2007  . HEMORRHOIDS, HX OF 09/05/2007  . HYPERLIPIDEMIA 09/05/2007  . HYPERTENSION 07/25/2007  . OSTEOARTHRITIS 09/05/2007  . SLEEP APNEA, OBSTRUCTIVE 09/05/2007  . CLL (chronic lymphocytic leukemia) 2010    stable   Past Surgical History  Procedure Date  . Eye surgery    Family History  Problem Relation Age of Onset  . Kidney disease Father   . Cancer Father     prostate-late on-set  . Cancer Brother     non-hodgkins lymphoma   History   Social History  . Marital Status: Married    Spouse Name: N/A    Number of Children: 2  . Years of Education: 13   Occupational History  . retired    Social History Main Topics  . Smoking status: Former Smoker    Types: Cigarettes    Quit date: 02/07/1963  . Smokeless tobacco: Never Used  . Alcohol Use: 3.5 oz/week    7  drink(s) per week     wine  . Drug Use: No  . Sexually Active: Yes -- Male partner(s)   Other Topics Concern  . 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 2 sons ' 63, '67. 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 CPR- yes, short-term mechanical ventilation, no prolonged heroic or futile measures.    Current Outpatient Prescriptions on File Prior to Visit  Medication Sig Dispense Refill  . aspirin 325 MG tablet Take 325 mg by mouth daily.         [name] atorvastatin (LIPITOR) 20 MG tablet Take 1 tablet (20 mg total) by mouth daily.  90 tablet  3  . furosemide (LASIX) 40 MG tablet Take 1 tablet (40 mg total) by mouth daily.  90 tablet  3  . metoprolol succinate (TOPROL-XL) 50 MG 24 hr tablet Take 1 tablet (50 mg total) by mouth daily.  90 tablet  2  . Multiple Vitamin (MULTIVITAMIN) tablet Take 1 tablet by mouth daily.        . sildenafil (VIAGRA) 100 MG tablet Take 100 mg by mouth daily as needed.             Review of Systems Constitutional:  Negative for fever, chills, activity change and unexpected weight change.  HEENT:  Negative for hearing loss, ear pain, congestion, neck stiffness and postnasal drip. Negative for sore throat or swallowing problems. Negative for dental complaints.   Eyes: Negative for vision loss or change in visual acuity.  Respiratory: Negative for chest tightness and wheezing. Negative for DOE.   Cardiovascular: Negative for chest pain or palpitations. No decreased exercise tolerance Gastrointestinal: No change in bowel habit. No bloating or gas. No reflux or indigestion Genitourinary: Negative for urgency, frequency, flank pain and difficulty urinating.  Musculoskeletal: Negative for myalgias, back pain, arthralgias and gait problem.  Neurological: Negative for dizziness, tremors, weakness and headaches.  Hematological: Negative for adenopathy.  Psychiatric/Behavioral: Negative for behavioral problems and dysphoric mood.       Objective:   Physical Exam Filed Vitals:   06/29/12 0851  BP: 112/62  Pulse: 59  Temp: 97.7 F (36.5 C)  Resp: 16   Wt Readings from Last 3 Encounters:  06/29/12 175 lb (79.379 kg)  06/09/12 174 lb 9.6 oz (79.198 kg)  03/12/12 179 lb 4 oz (81.307 kg)    Gen'l: Well nourished well developed white male in no acute distress  HEENT: Head: Normocephalic and atraumatic. Right Ear: External ear normal. EAC/TM nl. Left Ear: External ear normal.  EAC/TM nl. Nose: Nose  normal. Mouth/Throat: Oropharynx is clear and moist. Dentition - native, in good repair. No buccal or palatal lesions. Posterior pharynx clear. Eyes: Conjunctivae and sclera clear. EOM intact. Pupils are equal, round, and reactive to light. Right eye exhibits no discharge. Left eye exhibits no discharge. Neck: Normal range of motion. Neck supple. No JVD present. No tracheal deviation present. No thyromegaly present.  Cardiovascular: Normal rate, regular rhythm, no gallop, no friction rub, no murmur heard.      Quiet precordium. 2+ radial and DP pulses . No carotid bruits Pulmonary/Chest: Effort normal. No respiratory distress or increased WOB, no wheezes, no rales. No chest wall deformity or CVAT. Abdominal: Soft. Bowel sounds are normal in all quadrants. He exhibits no distension, no tenderness, no rebound or guarding, No heptosplenomegaly  Genitourinary:  deferred Musculoskeletal: Normal range of motion. He exhibits no  edema and no tenderness.       Small and large joints without redness, synovial thickening or deformity. Full range of motion preserved about all small, median and large joints.  Lymphadenopathy:    He has no cervical or supraclavicular adenopathy.  Neurological: He is alert and oriented to person, place, and time. CN II-XII intact. DTRs 2+ and symmetrical biceps, radial and patellar tendons. Cerebellar function normal with no tremor, rigidity, normal gait and station.  Skin: Skin is warm and dry. No rash noted. No erythema.  Psychiatric: He has a normal mood and affect. His behavior is normal. Thought content normal.   Lab Results  Component Value Date   WBC 28.5* 06/09/2012   HGB 14.9 06/09/2012   HCT 44.9 06/09/2012   PLT 142 06/09/2012   GLUCOSE 89 06/09/2012   CHOL 149 02/07/2012   TRIG 60.0 02/07/2012   HDL 58.70 02/07/2012   LDLCALC 78 02/07/2012   ALT 16 06/09/2012   AST 17 06/09/2012   NA 142 06/09/2012   K 4.6 06/09/2012   CL 104 06/09/2012   CREATININE 0.93 06/09/2012   BUN 13 06/09/2012     CO2 31 06/09/2012   TSH 1.48 04/09/2010   PSA 6.18* 01/11/2008          Assessment & Plan:

## 2012-06-29 NOTE — Assessment & Plan Note (Signed)
Interval history - he is followed by Dr. Arbutus Ped for CLL and has been stable. He has had no serious illness or surgery in the interval. Physical exam is normal. Reviewed previous labs: cholesterol is very well controlled. White blood count at 28,500 is lower than in April, normal Hgb and platelet counts. He is due for follow-up colonoscopy - did receive a letter from GI some time ago - and he is encouraged to schedule an appointment. Immunizations are up to date.  In summary - a very nice man who is medically stable and doing well. He will continue his healthy lifestyle and return in 1 year, sooner as needed.

## 2012-06-29 NOTE — Assessment & Plan Note (Signed)
BP Readings from Last 3 Encounters:  06/29/12 112/62  06/09/12 144/73  03/12/12 120/74   Good control on present medications. No change proposed.

## 2012-06-29 NOTE — Assessment & Plan Note (Signed)
No real change in symptoms. He has had LE arterial doppler exam that was normal and on exam he has easily palpable PT pulse.

## 2012-07-16 DIAGNOSIS — J069 Acute upper respiratory infection, unspecified: Secondary | ICD-10-CM | POA: Diagnosis not present

## 2012-07-20 ENCOUNTER — Encounter: Payer: Self-pay | Admitting: Internal Medicine

## 2012-07-20 ENCOUNTER — Other Ambulatory Visit: Payer: Self-pay | Admitting: Internal Medicine

## 2012-07-20 ENCOUNTER — Ambulatory Visit (INDEPENDENT_AMBULATORY_CARE_PROVIDER_SITE_OTHER): Payer: Medicare Other | Admitting: Internal Medicine

## 2012-07-20 ENCOUNTER — Ambulatory Visit: Payer: Medicare Other

## 2012-07-20 ENCOUNTER — Telehealth: Payer: Self-pay | Admitting: Internal Medicine

## 2012-07-20 VITALS — BP 162/92 | HR 99 | Temp 100.5°F | Resp 16 | Wt 174.0 lb

## 2012-07-20 DIAGNOSIS — R509 Fever, unspecified: Secondary | ICD-10-CM | POA: Diagnosis not present

## 2012-07-20 LAB — COMPREHENSIVE METABOLIC PANEL
Albumin: 4.1 g/dL (ref 3.5–5.2)
CO2: 30 mEq/L (ref 19–32)
Calcium: 8.9 mg/dL (ref 8.4–10.5)
GFR: 86.51 mL/min (ref >60.00)
Glucose, Bld: 113 mg/dL — ABNORMAL HIGH (ref 70–99)
Potassium: 3.9 mEq/L (ref 3.5–5.1)
Sodium: 138 mEq/L (ref 135–145)
Total Bilirubin: 1.9 mg/dL — ABNORMAL HIGH (ref 0.3–1.2)
Total Protein: 7.8 g/dL (ref 6.0–8.3)

## 2012-07-20 LAB — URINALYSIS, ROUTINE W REFLEX MICROSCOPIC
Bilirubin Urine: NEGATIVE
Leukocytes, UA: NEGATIVE
Nitrite: NEGATIVE
Urobilinogen, UA: 0.2 (ref 0.0–1.0)

## 2012-07-20 MED ORDER — LEVOFLOXACIN 500 MG PO TABS: 500.0000 mg | ORAL_TABLET | Freq: Every day | ORAL | Status: AC

## 2012-07-20 NOTE — Patient Instructions (Addendum)
Fever of unknown origin. Exam is unremarkable with no obvious source of infection.  Plan Lab: complete blood count, urinalysis, kidney function, blood cultures from two sites  Antibiotic - Levaquin once a day - a very broad spectrum drug  Tylenol 500 mg 1-2 tablets three times a day  Hydrate.

## 2012-07-20 NOTE — Telephone Encounter (Signed)
Caller: [name]/Patient; Patient Name: Jerry Gomez; PCP: Illene Regulus; Best Callback Phone Number: [phone].  Caller reports fever from 99 to almost 103 starting 07/16/12.  [facility] in [place].  Decreased appetite, dry cough are other symptoms.  Caller relates he was diagnosed with acute upper respiratory infection.  Taking Tylenol and Mucinex as instructed.  Temp 100.7 oral on 07/20/12.  Advised see provider in 24 hours per [facility] - Adult protocol.  Appointment with Dr. Debby Bud at 1615 on 07/20/12.

## 2012-07-20 NOTE — Progress Notes (Signed)
Subjective:    Patient ID: Jerry Gomez, male    DOB: 02-01-1931, 77 y.o.   MRN: [id number]  HPI Starting last Thursday, Augst 8th, he had the onset of fever with facial erythema. He wasn't feeling bad. [facility] but fever and was told to take APAP. Over the succeeding days he blood pressure started to rise. He has felt weak, no appetite and food doesn't take good. Aside from fever and rigors, sweats. Mild dry cough, no N/V, no dysuria, no frequency, normal back pain, no SOB, no abdominal pain. No diarrhea, joint pain or swelling.   Past Medical History  Diagnosis Date  . COLONIC POLYPS 03/02/2009  . EYE SURGERY, HX OF 09/05/2007  . HEMORRHOIDS, HX OF 09/05/2007  . HYPERLIPIDEMIA 09/05/2007  . HYPERTENSION 07/25/2007  . OSTEOARTHRITIS 09/05/2007  . SLEEP APNEA, OBSTRUCTIVE 09/05/2007  . CLL (chronic lymphocytic leukemia) 2010    stable   Past Surgical History  Procedure Date  . Eye surgery    Family History  Problem Relation Age of Onset  . Kidney disease Father   . Cancer Father     prostate-late on-set  . Cancer Brother     non-hodgkins lymphoma   History   Social History  . Marital Status: Married    Spouse Name: N/A    Number of Children: 2  . Years of Education: 13   Occupational History  . retired    Social History Main Topics  . Smoking status: Former Smoker    Types: Cigarettes    Quit date: 02/07/1963  . Smokeless tobacco: Never Used  . Alcohol Use: 3.5 oz/week    7 drink(s) per week     wine  . Drug Use: No  . Sexually Active: Yes -- Male partner(s)   Other Topics Concern  . 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 2 sons ' 63, '67. 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 CPR- yes, short-term mechanical ventilation, no prolonged heroic or futile measures.     Current Outpatient Prescriptions on File Prior to Visit  Medication Sig  Dispense Refill  . aspirin 325 MG tablet Take 325 mg by mouth daily.        [name] atorvastatin (LIPITOR) 20 MG tablet Take 1 tablet (20 mg total) by mouth daily.  90 tablet  3  . furosemide (LASIX) 40 MG tablet Take 1 tablet (40 mg total) by mouth daily.  90 tablet  3  . metoprolol succinate (TOPROL-XL) 50 MG 24 hr tablet Take 1 tablet (50 mg total) by mouth daily.  90 tablet  2  . Multiple Vitamin (MULTIVITAMIN) tablet Take 1 tablet by mouth daily.        . sildenafil (VIAGRA) 100 MG tablet Take 100 mg by mouth daily as needed.            Review of Systems System review is negative for any constitutional, cardiac, pulmonary, GI or neuro symptoms or complaints other than as described in the HPI.     Objective:   Physical Exam Filed Vitals:   07/20/12 1657  BP: 162/92  Pulse: 99  Temp: 100.5 F (38.1 C)  Resp: 16   Weight: 174 lb (78.926 kg)  Gen'l- WNWD white man who is warm to the touch and flushed HEENT- TMs normal, throat clear, C&S clear, fund normal w/o roth's spot Neck- supple Nodes -  negative supraclavicular region. Cor - Reg rate, no murmurs, no JVD, no carotid Bruits, no splinter hemorrhages Pulm - normal respirations, no rales or wheeze, no increased WOB Abd- BS+ , no guarding or rebound, no masses, no point tenderness Derm - no lesions Neuro - A&O x 3       Assessment & Plan:  Febrile illness - Jerry Gomez is uncomfortable but not acutely ill requiring hospitalization.  Plan Lab: CBCD, Bmet, Blood cultures x 2  Antibiotics - levaquin 500 mg daily x 7  Follow-up  Addendum 8/14: WBC 32.1 up from 28 with unchanged differential from his baseline CLL      Cmet normal      Blood cultures pending.

## 2012-07-21 LAB — CBC WITH DIFFERENTIAL/PLATELET
Eosinophils Relative: 0.2 % (ref 0.0–5.0)
HCT: 42.3 % (ref 39.0–52.0)
Lymphs Abs: 22.5 10*3/uL — ABNORMAL HIGH (ref 0.7–4.0)
Monocytes Relative: 1.5 % — ABNORMAL LOW (ref 3.0–12.0)
Platelets: 179 10*3/uL (ref 150.0–400.0)
WBC: 32.1 10*3/uL (ref 4.5–10.5)

## 2012-07-22 ENCOUNTER — Telehealth: Payer: Self-pay | Admitting: Internal Medicine

## 2012-07-22 NOTE — Telephone Encounter (Signed)
Called patient: he is feeling better. Still with some fever but not so high, no rigors. Feels weak and his HR is still up above normal. His BP is better.  Lab - WBC 31k up from 28 (has CLL) with differential that is predominently lymphocytes, no change. Blood cultures negative to date.  Plan - he is to finish the levaquin.

## 2012-07-27 LAB — CULTURE, BLOOD (SINGLE): Organism ID, Bacteria: NO GROWTH

## 2012-07-29 DIAGNOSIS — Z961 Presence of intraocular lens: Secondary | ICD-10-CM | POA: Diagnosis not present

## 2012-07-29 DIAGNOSIS — H35359 Cystoid macular degeneration, unspecified eye: Secondary | ICD-10-CM | POA: Diagnosis not present

## 2012-07-29 DIAGNOSIS — H2510 Age-related nuclear cataract, unspecified eye: Secondary | ICD-10-CM | POA: Diagnosis not present

## 2012-08-20 DIAGNOSIS — M2390 Unspecified internal derangement of unspecified knee: Secondary | ICD-10-CM | POA: Diagnosis not present

## 2012-08-26 DIAGNOSIS — M25569 Pain in unspecified knee: Secondary | ICD-10-CM | POA: Diagnosis not present

## 2012-09-08 ENCOUNTER — Ambulatory Visit (INDEPENDENT_AMBULATORY_CARE_PROVIDER_SITE_OTHER): Payer: Medicare Other

## 2012-09-08 DIAGNOSIS — Z23 Encounter for immunization: Secondary | ICD-10-CM

## 2012-09-21 DIAGNOSIS — M2390 Unspecified internal derangement of unspecified knee: Secondary | ICD-10-CM | POA: Diagnosis not present

## 2012-10-07 ENCOUNTER — Other Ambulatory Visit: Payer: Self-pay | Admitting: *Deleted

## 2012-10-07 MED ORDER — METOPROLOL SUCCINATE ER 50 MG PO TB24: 50.0000 mg | ORAL_TABLET | Freq: Every day | ORAL | Status: DC

## 2012-10-07 NOTE — Telephone Encounter (Signed)
R'cd fax from [facility] for refill of Metoprolol.

## 2012-12-14 ENCOUNTER — Other Ambulatory Visit (HOSPITAL_BASED_OUTPATIENT_CLINIC_OR_DEPARTMENT_OTHER): Payer: Medicare Other | Admitting: Lab

## 2012-12-14 ENCOUNTER — Ambulatory Visit (HOSPITAL_BASED_OUTPATIENT_CLINIC_OR_DEPARTMENT_OTHER): Payer: Medicare Other | Admitting: Internal Medicine

## 2012-12-14 ENCOUNTER — Encounter: Payer: Self-pay | Admitting: Internal Medicine

## 2012-12-14 ENCOUNTER — Telehealth: Payer: Self-pay | Admitting: Internal Medicine

## 2012-12-14 VITALS — BP 133/65 | HR 51 | Temp 96.8°F | Resp 18 | Wt 168.3 lb

## 2012-12-14 DIAGNOSIS — C9110 Chronic lymphocytic leukemia of B-cell type not having achieved remission: Secondary | ICD-10-CM

## 2012-12-14 LAB — COMPREHENSIVE METABOLIC PANEL (CC13)
AST: 21 U/L (ref 5–34)
Alkaline Phosphatase: 85 U/L (ref 40–150)
BUN: 17 mg/dL (ref 7.0–26.0)
Calcium: 9.2 mg/dL (ref 8.4–10.4)
Creatinine: 1 mg/dL (ref 0.7–1.3)

## 2012-12-14 LAB — CBC WITH DIFFERENTIAL/PLATELET
Basophils Absolute: 0 10*3/uL (ref 0.0–0.1)
EOS%: 0.4 % (ref 0.0–7.0)
HCT: 45.4 % (ref 38.4–49.9)
HGB: 14.9 g/dL (ref 13.0–17.1)
MCH: 31.3 pg (ref 27.2–33.4)
MCV: 95.1 fL (ref 79.3–98.0)
MONO%: 1.3 % (ref 0.0–14.0)
NEUT%: 10.8 % — ABNORMAL LOW (ref 39.0–75.0)
Platelets: 133 10*3/uL — ABNORMAL LOW (ref 140–400)

## 2012-12-14 LAB — LACTATE DEHYDROGENASE (CC13): LDH: 169 U/L (ref 125–245)

## 2012-12-14 NOTE — Telephone Encounter (Signed)
Gave pt appt for July 2014 lab and MD °

## 2012-12-14 NOTE — Progress Notes (Signed)
[facility] [phone]   [phone]  OFFICE PROGRESS NOTE  Illene Regulus, MD [address] [place] [address]  DIAGNOSIS: Stage 0 Chronic lymphocytic leukemia   PRIOR THERAPY: None   CURRENT THERAPY: Observation  INTERVAL HISTORY: Jerry Gomez 79 y.o. male returns to the clinic today for routine six-month followup visit. The patient is feeling fine today with no specific complaints. He denied having any significant weight loss or night sweats. He denied having any palpable lymphadenopathy. He has no chest pain, shortness breath, cough or hemoptysis. The patient has repeat CBC performed earlier today and he is here for evaluation and discussion of his lab results.  MEDICAL HISTORY: Past Medical History  Diagnosis Date  . COLONIC POLYPS 03/02/2009  . EYE SURGERY, HX OF 09/05/2007  . HEMORRHOIDS, HX OF 09/05/2007  . HYPERLIPIDEMIA 09/05/2007  . HYPERTENSION 07/25/2007  . OSTEOARTHRITIS 09/05/2007  . SLEEP APNEA, OBSTRUCTIVE 09/05/2007  . CLL (chronic lymphocytic leukemia) 2010    stable    ALLERGIES:   has no known allergies.  MEDICATIONS:  Current Outpatient Prescriptions  Medication Sig Dispense Refill  . aspirin 325 MG tablet Take 325 mg by mouth daily.        [name] atorvastatin (LIPITOR) 20 MG tablet Take 1 tablet (20 mg total) by mouth daily.  90 tablet  3  . furosemide (LASIX) 40 MG tablet Take 1 tablet (40 mg total) by mouth daily.  90 tablet  3  . metoprolol succinate (TOPROL-XL) 50 MG 24 hr tablet Take 1 tablet (50 mg total) by mouth daily.  90 tablet  2  . Multiple Vitamin (MULTIVITAMIN) tablet Take 1 tablet by mouth daily.        . sildenafil (VIAGRA) 100 MG tablet Take 100 mg by mouth daily as needed.          SURGICAL HISTORY:  Past Surgical History  Procedure Date  . Eye surgery     REVIEW OF SYSTEMS:  A comprehensive review of systems was negative.   PHYSICAL EXAMINATION: General appearance: alert, cooperative and  no distress Head: Normocephalic, without obvious abnormality, atraumatic Neck: no adenopathy Lymph nodes: Cervical, supraclavicular, and axillary nodes normal. Resp: clear to auscultation bilaterally Cardio: regular rate and rhythm, S1, S2 normal, no murmur, click, rub or gallop GI: soft, non-tender; bowel sounds normal; no masses,  no organomegaly Extremities: extremities normal, atraumatic, no cyanosis or edema  ECOG PERFORMANCE STATUS: 1 - Symptomatic but completely ambulatory  Blood pressure 133/65, pulse 51, temperature 96.8 F (36 C), resp. rate 18, weight 168 lb 4.8 oz (76.34 kg).  LABORATORY DATA: Lab Results  Component Value Date   WBC 31.4* 12/14/2012   HGB 14.9 12/14/2012   HCT 45.4 12/14/2012   MCV 95.1 12/14/2012   PLT 133* 12/14/2012      Chemistry      Component Value Date/Time   NA 138 07/20/2012 1721   K 3.9 07/20/2012 1721   CL 97 07/20/2012 1721   CO2 30 07/20/2012 1721   BUN 14 07/20/2012 1721   CREATININE 0.9 07/20/2012 1721      Component Value Date/Time   CALCIUM 8.9 07/20/2012 1721   ALKPHOS 88 07/20/2012 1721   AST 30 07/20/2012 1721   ALT 30 07/20/2012 1721   BILITOT 1.9* 07/20/2012 1721       RADIOGRAPHIC STUDIES: No results found.  ASSESSMENT: This is a very pleasant 79 years old white male with stage 0 chronic lymphocytic  leukemia currently on observation.  PLAN: The patient has no evidence for disease progression on his recent blood work. I discussed the lab result with him and recommended for him to continue on observation with repeat CBC, comprehensive metabolic panel and LDH in 6 months He was advised to call me immediately if he has any concerning symptoms in the interval.  All questions were answered. The patient knows to call the clinic with any problems, questions or concerns. We can certainly see the patient much sooner if necessary.

## 2012-12-14 NOTE — Patient Instructions (Signed)
Your CBC showed stable white blood count today. Followup in 6 months with repeat CBC, comprehensive metabolic panel and LDH

## 2013-02-23 ENCOUNTER — Other Ambulatory Visit: Payer: Self-pay

## 2013-02-23 ENCOUNTER — Encounter: Payer: Self-pay | Admitting: Internal Medicine

## 2013-02-23 MED ORDER — ATORVASTATIN CALCIUM 20 MG PO TABS: 20.0000 mg | ORAL_TABLET | Freq: Every day | ORAL | Status: DC

## 2013-03-11 DIAGNOSIS — D2370 Other benign neoplasm of skin of unspecified lower limb, including hip: Secondary | ICD-10-CM | POA: Diagnosis not present

## 2013-03-11 DIAGNOSIS — L723 Sebaceous cyst: Secondary | ICD-10-CM | POA: Diagnosis not present

## 2013-03-11 DIAGNOSIS — Z85828 Personal history of other malignant neoplasm of skin: Secondary | ICD-10-CM | POA: Diagnosis not present

## 2013-03-11 DIAGNOSIS — L821 Other seborrheic keratosis: Secondary | ICD-10-CM | POA: Diagnosis not present

## 2013-03-23 ENCOUNTER — Ambulatory Visit (INDEPENDENT_AMBULATORY_CARE_PROVIDER_SITE_OTHER): Payer: Medicare Other | Admitting: Internal Medicine

## 2013-03-23 ENCOUNTER — Encounter: Payer: Self-pay | Admitting: Internal Medicine

## 2013-03-23 VITALS — BP 110/76 | HR 76 | Temp 97.2°F | Resp 16 | Wt 165.0 lb

## 2013-03-23 DIAGNOSIS — L255 Unspecified contact dermatitis due to plants, except food: Secondary | ICD-10-CM | POA: Diagnosis not present

## 2013-03-23 DIAGNOSIS — L237 Allergic contact dermatitis due to plants, except food: Secondary | ICD-10-CM | POA: Insufficient documentation

## 2013-03-23 MED ORDER — METHYLPREDNISOLONE ACETATE 80 MG/ML IJ SUSP
80.0000 mg | Freq: Once | INTRAMUSCULAR | Status: AC
  Administered 2013-03-23: 80 mg via INTRAMUSCULAR

## 2013-03-23 MED ORDER — TRIAMCINOLONE ACETONIDE 0.5 % EX CREA: TOPICAL_CREAM | Freq: Three times a day (TID) | CUTANEOUS | Status: DC

## 2013-03-23 NOTE — Progress Notes (Signed)
  Subjective:    Patient ID: Jerry Gomez, male    DOB: [birth year], 79 y.o.   MRN: [id number]  Rash This is a new problem. The current episode started in the past 7 days. The problem has been gradually worsening since onset. The affected locations include the right hip and left wrist. The rash is characterized by blistering and itchiness. It is unknown if there was an exposure to a precipitant. Past treatments include anti-itch cream. The treatment provided mild relief. His past medical history is significant for allergies. (Poison ivy reaction)      Review of Systems  Skin: Positive for rash.       Objective:   Physical Exam  Constitutional: He is oriented to person, place, and time. He appears well-developed.  HENT:  Mouth/Throat: Oropharynx is clear and moist.  Eyes: Conjunctivae are normal. Pupils are equal, round, and reactive to light.  Neck: Normal range of motion. No JVD present. No thyromegaly present.  Cardiovascular: Normal rate, regular rhythm, normal heart sounds and intact distal pulses.  Exam reveals no gallop and no friction rub.   No murmur heard. Pulmonary/Chest: Effort normal and breath sounds normal. No respiratory distress. He has no wheezes. He has no rales. He exhibits no tenderness.  Abdominal: Soft. Bowel sounds are normal. He exhibits no distension and no mass. There is no tenderness. There is no rebound and no guarding.  Musculoskeletal: Normal range of motion. He exhibits no edema and no tenderness.  Lymphadenopathy:    He has no cervical adenopathy.  Neurological: He is alert and oriented to person, place, and time. He has normal reflexes. No cranial nerve deficit. He exhibits normal muscle tone. Coordination normal.  Skin: Skin is warm and dry. Rash noted.  Psychiatric: He has a normal mood and affect. His behavior is normal. Judgment and thought content normal.   eryth vesicles on RLE - many in clusters and small on L wrist       Assessment & Plan:

## 2013-03-23 NOTE — Assessment & Plan Note (Signed)
Depomedrol 80 mg im Triamc topical

## 2013-03-24 ENCOUNTER — Ambulatory Visit (AMBULATORY_SURGERY_CENTER): Payer: Medicare Other | Admitting: *Deleted

## 2013-03-24 VITALS — Ht 66.0 in | Wt 166.4 lb

## 2013-03-24 DIAGNOSIS — Z1211 Encounter for screening for malignant neoplasm of colon: Secondary | ICD-10-CM

## 2013-03-24 MED ORDER — MOVIPREP 100 G PO SOLR: 1.0000 | Freq: Once | ORAL | Status: DC

## 2013-03-24 NOTE — Progress Notes (Signed)
Denies any allergies to eggs or soy products. Denies any complications with sedation or anesthesia. 

## 2013-04-01 ENCOUNTER — Telehealth: Payer: Self-pay | Admitting: Internal Medicine

## 2013-04-01 NOTE — Telephone Encounter (Signed)
Diet restrictions reviewed with pt.

## 2013-04-07 ENCOUNTER — Ambulatory Visit (AMBULATORY_SURGERY_CENTER): Payer: Medicare Other | Admitting: Internal Medicine

## 2013-04-07 ENCOUNTER — Encounter: Payer: Self-pay | Admitting: Internal Medicine

## 2013-04-07 VITALS — BP 131/68 | HR 55 | Temp 96.3°F | Resp 15 | Ht 66.0 in | Wt 166.0 lb

## 2013-04-07 DIAGNOSIS — D126 Benign neoplasm of colon, unspecified: Secondary | ICD-10-CM | POA: Diagnosis not present

## 2013-04-07 DIAGNOSIS — Z1211 Encounter for screening for malignant neoplasm of colon: Secondary | ICD-10-CM | POA: Diagnosis not present

## 2013-04-07 DIAGNOSIS — I10 Essential (primary) hypertension: Secondary | ICD-10-CM | POA: Diagnosis not present

## 2013-04-07 DIAGNOSIS — E119 Type 2 diabetes mellitus without complications: Secondary | ICD-10-CM | POA: Diagnosis not present

## 2013-04-07 MED ORDER — SODIUM CHLORIDE 0.9 % IV SOLN: 500.0000 mL | INTRAVENOUS | Status: DC

## 2013-04-07 NOTE — Patient Instructions (Addendum)
YOU HAD AN ENDOSCOPIC PROCEDURE TODAY AT THE [facility]: Refer to the procedure report that was given to you for any specific questions about what was found during the examination.  If the procedure report does not answer your questions, please call your gastroenterologist to clarify.  If you requested that your care partner not be given the details of your procedure findings, then the procedure report has been included in a sealed envelope for you to review at your convenience later.  YOU SHOULD EXPECT: Some feelings of bloating in the abdomen. Passage of more gas than usual.  Walking can help get rid of the air that was put into your GI tract during the procedure and reduce the bloating. If you had a lower endoscopy (such as a colonoscopy or flexible sigmoidoscopy) you may notice spotting of blood in your stool or on the toilet paper. If you underwent a bowel prep for your procedure, then you may not have a normal bowel movement for a few days.  DIET: Your first meal following the procedure should be a light meal and then it is ok to progress to your normal diet.  A half-sandwich or bowl of soup is an example of a good first meal.  Heavy or fried foods are harder to digest and may make you feel nauseous or bloated.  Likewise meals heavy in dairy and vegetables can cause extra gas to form and this can also increase the bloating.  Drink plenty of fluids but you should avoid alcoholic beverages for 24 hours.  ACTIVITY: Your care partner should take you home directly after the procedure.  You should plan to take it easy, moving slowly for the rest of the day.  You can resume normal activity the day after the procedure however you should NOT DRIVE or use heavy machinery for 24 hours (because of the sedation medicines used during the test).    SYMPTOMS TO REPORT IMMEDIATELY: A gastroenterologist can be reached at any hour.  During normal business hours, 8:30 AM to 5:00 PM Monday through Friday,  call [phone].  After hours and on weekends, please call the GI answering service at [phone] who will take a message and have the physician on call contact you.   Following lower endoscopy (colonoscopy or flexible sigmoidoscopy):  Excessive amounts of blood in the stool  Significant tenderness or worsening of abdominal pains  Swelling of the abdomen that is new, acute  Fever of 100F or higher    FOLLOW UP: If any biopsies were taken you will be contacted by phone or by letter within the next 1-3 weeks.  Call your gastroenterologist if you have not heard about the biopsies in 3 weeks.  Our staff will call the home number listed on your records the next business day following your procedure to check on you and address any questions or concerns that you may have at that time regarding the information given to you following your procedure. This is a courtesy call and so if there is no answer at the home number and we have not heard from you through the emergency physician on call, we will assume that you have returned to your regular daily activities without incident.  SIGNATURES/CONFIDENTIALITY: You and/or your care partner have signed paperwork which will be entered into your electronic medical record.  These signatures attest to the fact that that the information above on your After Visit Summary has been reviewed and is understood.  Full responsibility of the confidentiality   of this discharge information lies with you and/or your care-partner.     

## 2013-04-07 NOTE — Progress Notes (Signed)
Called to room to assist during endoscopic procedure.  Patient ID and intended procedure confirmed with present staff. Received instructions for my participation in the procedure from the performing physician.  

## 2013-04-07 NOTE — Op Note (Signed)
[facility] [address] [place], [address]   COLONOSCOPY PROCEDURE REPORT  PATIENT: Jerry Gomez, Jerry Gomez  MR#: [id number] BIRTHDATE: May 08, 1931 , 81  yrs. old GENDER: Male ENDOSCOPIST: Roxy Cedar, MD REFERRED XB:[id number] Esther Hardy, M.D. PROCEDURE DATE:  04/07/2013 PROCEDURE:   Colonoscopy, snare polypectomy x 1 ASA CLASS:   Class II INDICATIONS:Average risk patient for colon cancer.   Negative exam 2004 MEDICATIONS: MAC sedation, administered by CRNA and propofol (Diprivan) 150mg  IV  DESCRIPTION OF PROCEDURE:   After the risks benefits and alternatives of the procedure were thoroughly explained, informed consent was obtained.  A digital rectal exam revealed no abnormalities of the rectum.   The LB [id number]  endoscope was introduced through the anus and advanced to the cecum, which was identified by both the appendix and ileocecal valve. No adverse events experienced.   The quality of the prep was good, using MoviPrep  The instrument was then slowly withdrawn as the colon was fully examined.      COLON FINDINGS: A diminutive polyp was found in the ascending colon. A polypectomy was performed with a cold snare.  The resection was complete and the polyp tissue was completely retrieved.   The colon was otherwise normal.  There was no diverticulosis, inflammation, other polyps or cancers .  Retroflexed views revealed internal hemorrhoids. The time to cecum=2 minutes 55 seconds.  Withdrawal time=8 minutes 46 seconds.  The scope was withdrawn and the procedure completed. COMPLICATIONS: There were no complications.  ENDOSCOPIC IMPRESSION: 1.   Diminutive polyp was found in the ascending colon; polypectomy was performed with a cold snare 2.   The colon was otherwise normal  RECOMMENDATIONS: 1. Return to the care of your primary provider.  GI follow up as needed   eSigned:  Roxy Cedar, MD 04/07/2013 10:30 AM   cc: Jacques Navy, MD and  The Patient

## 2013-04-07 NOTE — Progress Notes (Addendum)
Patient did not have preoperative order for IV antibiotic SSI prophylaxis. (G8918)  Patient did not experience any of the following events: a burn prior to discharge; a fall within the facility; wrong site/side/patient/procedure/implant event; or a hospital transfer or hospital admission upon discharge from the facility. (G8907)  

## 2013-04-08 ENCOUNTER — Telehealth: Payer: Self-pay | Admitting: *Deleted

## 2013-04-08 NOTE — Telephone Encounter (Signed)
  Follow up Call-  Call back number 04/07/2013  Post procedure Call Back phone  # [phone]  Permission to leave phone message Yes     No answer,left message.

## 2013-04-14 ENCOUNTER — Encounter: Payer: Self-pay | Admitting: Internal Medicine

## 2013-06-01 DIAGNOSIS — H35379 Puckering of macula, unspecified eye: Secondary | ICD-10-CM | POA: Diagnosis not present

## 2013-06-01 DIAGNOSIS — H2510 Age-related nuclear cataract, unspecified eye: Secondary | ICD-10-CM | POA: Diagnosis not present

## 2013-06-01 DIAGNOSIS — H35359 Cystoid macular degeneration, unspecified eye: Secondary | ICD-10-CM | POA: Diagnosis not present

## 2013-06-01 DIAGNOSIS — Z961 Presence of intraocular lens: Secondary | ICD-10-CM | POA: Diagnosis not present

## 2013-06-14 ENCOUNTER — Telehealth: Payer: Self-pay | Admitting: Internal Medicine

## 2013-06-14 ENCOUNTER — Encounter: Payer: Self-pay | Admitting: Internal Medicine

## 2013-06-14 ENCOUNTER — Ambulatory Visit (HOSPITAL_BASED_OUTPATIENT_CLINIC_OR_DEPARTMENT_OTHER): Payer: Medicare Other | Admitting: Internal Medicine

## 2013-06-14 ENCOUNTER — Other Ambulatory Visit (HOSPITAL_BASED_OUTPATIENT_CLINIC_OR_DEPARTMENT_OTHER): Payer: Medicare Other | Admitting: Lab

## 2013-06-14 VITALS — BP 143/61 | HR 55 | Temp 97.4°F | Resp 17 | Ht 66.0 in | Wt 164.4 lb

## 2013-06-14 DIAGNOSIS — C9110 Chronic lymphocytic leukemia of B-cell type not having achieved remission: Secondary | ICD-10-CM | POA: Diagnosis not present

## 2013-06-14 LAB — CBC WITH DIFFERENTIAL/PLATELET
BASO%: 0.1 % (ref 0.0–2.0)
HCT: 42.9 % (ref 38.4–49.9)
HGB: 14.2 g/dL (ref 13.0–17.1)
MCHC: 33.2 g/dL (ref 32.0–36.0)
MONO#: 0.5 10*3/uL (ref 0.1–0.9)
NEUT%: 9.4 % — ABNORMAL LOW (ref 39.0–75.0)
WBC: 33.9 10*3/uL — ABNORMAL HIGH (ref 4.0–10.3)
lymph#: 30.1 10*3/uL — ABNORMAL HIGH (ref 0.9–3.3)

## 2013-06-14 LAB — COMPREHENSIVE METABOLIC PANEL (CC13)
ALT: 25 U/L (ref 0–55)
BUN: 17.7 mg/dL (ref 7.0–26.0)
CO2: 33 mEq/L — ABNORMAL HIGH (ref 22–29)
Calcium: 9.2 mg/dL (ref 8.4–10.4)
Chloride: 100 mEq/L (ref 98–109)
Creatinine: 1 mg/dL (ref 0.7–1.3)

## 2013-06-14 LAB — LACTATE DEHYDROGENASE (CC13): LDH: 194 U/L (ref 125–245)

## 2013-06-14 LAB — TECHNOLOGIST REVIEW

## 2013-06-14 NOTE — Patient Instructions (Signed)
No significant evidence for disease progression.  Followup visit in 6 months with repeat CBC, comprehensive metabolic panel and LDH.

## 2013-06-14 NOTE — Progress Notes (Signed)
[facility] [phone]   Fax:(336) [phone]  OFFICE PROGRESS NOTE  Illene Regulus, MD [address] [place] [address]  DIAGNOSIS: Stage 0 Chronic lymphocytic leukemia   PRIOR THERAPY: None   CURRENT THERAPY: Observation  INTERVAL HISTORY: Jerry Gomez 79 y.o. male returns to the clinic today for six-month followup visit. The patient is feeling fine today with no specific complaints. He denied having any significant weight loss or night sweats. He denied having any palpable lymphadenopathy. The patient denied having any significant chest pain, shortness breath, cough or hemoptysis. He would have repeat CBC performed earlier today and he is here for evaluation and discussion of his lab results.  MEDICAL HISTORY: Past Medical History  Diagnosis Date  . COLONIC POLYPS 03/02/2009  . EYE SURGERY, HX OF 09/05/2007  . HEMORRHOIDS, HX OF 09/05/2007  . HYPERLIPIDEMIA 09/05/2007  . HYPERTENSION 07/25/2007  . OSTEOARTHRITIS 09/05/2007  . SLEEP APNEA, OBSTRUCTIVE 09/05/2007  . CLL (chronic lymphocytic leukemia) 2010    stable    ALLERGIES:  has No Known Allergies.  MEDICATIONS:  Current Outpatient Prescriptions  Medication Sig Dispense Refill  . aspirin 325 MG tablet Take 325 mg by mouth daily.        [name] atorvastatin (LIPITOR) 20 MG tablet Take 1 tablet (20 mg total) by mouth daily.  90 tablet  3  . furosemide (LASIX) 40 MG tablet Take 1 tablet (40 mg total) by mouth daily.  90 tablet  3  . metoprolol succinate (TOPROL-XL) 50 MG 24 hr tablet Take 1 tablet (50 mg total) by mouth daily.  90 tablet  2  . Multiple Vitamin (MULTIVITAMIN) tablet Take 1 tablet by mouth daily.        . sildenafil (VIAGRA) 100 MG tablet Take 100 mg by mouth daily as needed.         No current facility-administered medications for this visit.    SURGICAL HISTORY:  Past Surgical History  Procedure Laterality Date  . Eye surgery      REVIEW OF SYSTEMS:  A comprehensive  review of systems was negative.   PHYSICAL EXAMINATION: General appearance: alert, cooperative and no distress Head: Normocephalic, without obvious abnormality, atraumatic Neck: no adenopathy Lymph nodes: Cervical, supraclavicular, and axillary nodes normal. Resp: clear to auscultation bilaterally Cardio: regular rate and rhythm, S1, S2 normal, no murmur, click, rub or gallop GI: soft, non-tender; bowel sounds normal; no masses,  no organomegaly Extremities: extremities normal, atraumatic, no cyanosis or edema  ECOG PERFORMANCE STATUS: 1 - Symptomatic but completely ambulatory  Blood pressure 143/61, pulse 55, temperature 97.4 F (36.3 C), temperature source Oral, resp. rate 17, height 5\' 6"  (1.676 m), weight 164 lb 6.4 oz (74.571 kg).  LABORATORY DATA: Lab Results  Component Value Date   WBC 33.9* 06/14/2013   HGB 14.2 06/14/2013   HCT 42.9 06/14/2013   MCV 97.0 06/14/2013   PLT 135* 06/14/2013      Chemistry      Component Value Date/Time   NA 141 12/14/2012 1031   NA 138 07/20/2012 1721   K 4.5 12/14/2012 1031   K 3.9 07/20/2012 1721   CL 104 12/14/2012 1031   CL 97 07/20/2012 1721   CO2 30* 12/14/2012 1031   CO2 30 07/20/2012 1721   BUN 17.0 12/14/2012 1031   BUN 14 07/20/2012 1721   CREATININE 1.0 12/14/2012 1031   CREATININE 0.9 07/20/2012 1721      Component Value Date/Time  CALCIUM 9.2 12/14/2012 1031   CALCIUM 8.9 07/20/2012 1721   ALKPHOS 85 12/14/2012 1031   ALKPHOS 88 07/20/2012 1721   AST 21 12/14/2012 1031   AST 30 07/20/2012 1721   ALT 18 12/14/2012 1031   ALT 30 07/20/2012 1721   BILITOT 1.70* 12/14/2012 1031   BILITOT 1.9* 07/20/2012 1721       RADIOGRAPHIC STUDIES: No results found.  ASSESSMENT AND PLAN: This is a very pleasant 79 years old white male with history of stage 0 chronic lymphocytic leukemia currently on observation. The patient is doing fine with no significant evidence for disease progression. I discussed the lab result with the patient.  I recommended for him to  continue on observation with repeat CBC, comprehensive metabolic panel and LDH in 6 months.  All questions were answered. The patient knows to call the clinic with any problems, questions or concerns. We can certainly see the patient much sooner if necessary.

## 2013-06-14 NOTE — Telephone Encounter (Signed)
Gave pt appt for lab and MD visit for January 2015

## 2013-08-02 DIAGNOSIS — Z961 Presence of intraocular lens: Secondary | ICD-10-CM | POA: Diagnosis not present

## 2013-08-02 DIAGNOSIS — H2510 Age-related nuclear cataract, unspecified eye: Secondary | ICD-10-CM | POA: Diagnosis not present

## 2013-08-02 DIAGNOSIS — H35359 Cystoid macular degeneration, unspecified eye: Secondary | ICD-10-CM | POA: Diagnosis not present

## 2013-08-04 ENCOUNTER — Encounter: Payer: Self-pay | Admitting: Internal Medicine

## 2013-08-04 ENCOUNTER — Ambulatory Visit (INDEPENDENT_AMBULATORY_CARE_PROVIDER_SITE_OTHER): Payer: Medicare Other | Admitting: Internal Medicine

## 2013-08-04 VITALS — BP 108/70 | HR 50 | Temp 97.6°F | Ht 66.25 in | Wt 165.4 lb

## 2013-08-04 DIAGNOSIS — E785 Hyperlipidemia, unspecified: Secondary | ICD-10-CM | POA: Diagnosis not present

## 2013-08-04 DIAGNOSIS — C9110 Chronic lymphocytic leukemia of B-cell type not having achieved remission: Secondary | ICD-10-CM | POA: Diagnosis not present

## 2013-08-04 DIAGNOSIS — I10 Essential (primary) hypertension: Secondary | ICD-10-CM

## 2013-08-04 DIAGNOSIS — Z23 Encounter for immunization: Secondary | ICD-10-CM | POA: Diagnosis not present

## 2013-08-04 DIAGNOSIS — Z0000 Encounter for general adult medical examination without abnormal findings: Secondary | ICD-10-CM

## 2013-08-04 NOTE — Assessment & Plan Note (Signed)
BP Readings from Last 3 Encounters:  08/04/13 108/70  06/14/13 143/61  04/07/13 131/68   Good control on present medication. Lab in July with normal renal function and lytes.  Plan Continue present medications

## 2013-08-04 NOTE — Assessment & Plan Note (Signed)
Interval history is benign. Physical exam is normal. Lab from July in normal range. He is current with colorectal cancer screening and does not need further screening. Immunizations are up to date and flu shot is given today.  In summary - a very nice man who is medically stable. He will return in 1 year or sooner prn.

## 2013-08-04 NOTE — Progress Notes (Signed)
Subjective:    Patient ID: Jerry Gomez, male    DOB: 09-20-1931, 79 y.o.   MRN: [id number]  HPI The patient is here for annual Medicare wellness examination and management of other chronic and acute problems.  He has followed with Dr. Arbutus Ped for his CLL which is stable. He has been medically stable. He has occasional left leg pain which is positional.    The risk factors are reflected in the social history.  The roster of all physicians providing medical care to patient - is listed in the Snapshot section of the chart.  Activities of daily living:  The patient is 100% inedpendent in all ADLs: dressing, toileting, feeding as well as independent mobility  Home safety : The patient has smoke detectors in the home. Falls - none.  They wear seatbelts. No firearms at home. There is no violence in the home.   There is no risks for hepatitis, STDs or HIV. There is no   history of blood transfusion. They have no travel history to infectious disease endemic areas of the world.  The patient has seen their dentist in the last six month. They have  seen their eye doctor in the last year. They deny any hearing difficulty and have not had audiologic testing in the last year.    They do not  have excessive sun exposure. Discussed the need for sun protection: hats, long sleeves and use of sunscreen if there is significant sun exposure.   Diet: the importance of a healthy diet is discussed. They do have a healthy diet.  The patient has a regular exercise program. Does a lot of yard work and plays golf 2-3 times a week.   The benefits of regular aerobic exercise were discussed.  Depression screen: there are no signs or vegative symptoms of depression- irritability, change in appetite, anhedonia, sadness/tearfullness.  Cognitive assessment: the patient manages all their financial and personal affairs and is actively engaged. They could relate day,date,year and events; recalled 3/3 objects at 3 minutes;  performed clock-face test normally.  The following portions of the patient's history were reviewed and updated as appropriate: allergies, current medications, past family history, past medical history,  past surgical history, past social history  and problem list.  Past Medical History  Diagnosis Date  . COLONIC POLYPS 03/02/2009  . EYE SURGERY, HX OF 09/05/2007  . HEMORRHOIDS, HX OF 09/05/2007  . HYPERLIPIDEMIA 09/05/2007  . HYPERTENSION 07/25/2007  . OSTEOARTHRITIS 09/05/2007  . SLEEP APNEA, OBSTRUCTIVE 09/05/2007  . CLL (chronic lymphocytic leukemia) 2010    stable   Past Surgical History  Procedure Laterality Date  . Eye surgery     Family History  Problem Relation Age of Onset  . Kidney disease Father   . Cancer Father     prostate-late on-set  . Cancer Brother     non-hodgkins lymphoma  . Colon cancer Neg Hx   . Esophageal cancer Neg Hx   . Rectal cancer Neg Hx   . Stomach cancer Neg Hx    History   Social History  . Marital Status: Married    Spouse Name: N/A    Number of Children: 2  . Years of Education: 13   Occupational History  . retired    Social History Main Topics  . Smoking status: Former Smoker    Types: Cigarettes    Quit date: 02/07/1963  . Smokeless tobacco: Never Used  . Alcohol Use: 7.0 oz/week    14 drink(s) per week  Comment: wine  . Drug Use: No  . Sexual Activity: Yes    Partners: Female   Other Topics Concern  . 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 2 sons ' 63, '67. 2 grandchildren.   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 CPR- yes, short-term mechanical ventilation, no prolonged heroic or futile measures.     Current Outpatient Prescriptions on File Prior to Visit  Medication Sig Dispense Refill  . aspirin 325 MG tablet Take 325 mg by mouth daily.        [name] atorvastatin (LIPITOR) 20 MG tablet Take 1 tablet (20 mg total) by mouth daily.  90 tablet  3  .  furosemide (LASIX) 40 MG tablet Take 1 tablet (40 mg total) by mouth daily.  90 tablet  3  . metoprolol succinate (TOPROL-XL) 50 MG 24 hr tablet Take 1 tablet (50 mg total) by mouth daily.  90 tablet  2  . Multiple Vitamin (MULTIVITAMIN) tablet Take 1 tablet by mouth daily.        . sildenafil (VIAGRA) 100 MG tablet Take 100 mg by mouth daily as needed.         No current facility-administered medications on file prior to visit.    Vision, hearing, body mass index were assessed and reviewed.   During the course of the visit the patient was educated and counseled about appropriate screening and preventive services including : fall prevention , diabetes screening, nutrition counseling, colorectal cancer screening, and recommended immunizations.    Review of Systems Constitutional:  Negative for fever, chills, activity change and unexpected weight change.  HEENT:  Negative for hearing loss, ear pain, congestion, neck stiffness and postnasal drip. Negative for sore throat or swallowing problems. Negative for dental complaints.   Eyes: Negative for vision loss or change in visual acuity.  Respiratory: Negative for chest tightness and wheezing. Negative for DOE.   Cardiovascular: Negative for chest pain or palpitations. No decreased exercise tolerance Gastrointestinal: No change in bowel habit. No bloating or gas. No reflux or indigestion Genitourinary: Negative for urgency, frequency, flank pain and difficulty urinating.  Musculoskeletal: Negative for myalgias, back pain, arthralgias and gait problem.  Neurological: Negative for dizziness, tremors, weakness and headaches.  Hematological: Negative for adenopathy.  Psychiatric/Behavioral: Negative for behavioral problems and dysphoric mood.       Objective:   Physical Exam Filed Vitals:   08/04/13 0958  BP: 108/70  Pulse: 50  Temp: 97.6 F (36.4 C)   Wt Readings from Last 3 Encounters:  08/04/13 165 lb 6.4 oz (75.025 kg)  06/14/13  164 lb 6.4 oz (74.571 kg)  04/07/13 166 lb (75.297 kg)   Gen'l: Well nourished well developed white male in no acute distress  HEENT: Head: Normocephalic and atraumatic. Right Ear: External ear normal. EAC/TM nl. Left Ear: External ear normal.  EAC/TM nl. Nose: Nose normal. Mouth/Throat: Oropharynx is clear and moist. Dentition - native, in good repair. No buccal or palatal lesions. Posterior pharynx clear. Eyes: Conjunctivae and sclera clear. EOM intact. Pupils are equal, round, and reactive to light. Right eye exhibits no discharge. Small medial pterygium. Left eye exhibits no discharge. Lens - obscured with cataract - could not visualize fundi. Neck: Normal range of motion. Neck supple. No JVD present. No tracheal deviation present. No thyromegaly present.  Cardiovascular: Normal rate, regular rhythm, no gallop, no friction rub, no murmur heard.  Quiet precordium. 2+ radial and DP pulses . No carotid bruits Pulmonary/Chest: Effort normal. No respiratory distress or increased WOB, no wheezes, no rales. No chest wall deformity or CVAT. Abdomen: Soft. Bowel sounds are normal in all quadrants. He exhibits no distension, no tenderness, no rebound or guarding, No heptosplenomegaly  Genitourinary:  deferred Musculoskeletal: Normal range of motion. He exhibits no edema and no tenderness.       Small and large joints without redness, synovial thickening or deformity. Full range of motion preserved about all small, median and large joints.  Lymphadenopathy:    He has no cervical or supraclavicular adenopathy.  Neurological: He is alert and oriented to person, place, and time. CN II-XII intact. DTRs 2+ and symmetrical biceps, radial and patellar tendons. Cerebellar function normal with no tremor, rigidity, normal gait and station.  Skin: Skin is warm and dry. No rash noted. No erythema.  Psychiatric: He has a normal mood and affect. His behavior is normal. Thought content normal.   Recent Results  (from the past 2160 hour(s))  CBC WITH DIFFERENTIAL     Status: Abnormal   Collection Time    06/14/13 11:04 AM      Result Value Range   WBC 33.9 (*) 4.0 - 10.3 10e3/uL   NEUT# 3.2  1.5 - 6.5 10e3/uL   HGB 14.2  13.0 - 17.1 g/dL   HCT 91.4  78.2 - 95.6 %   Platelets 135 (*) 140 - 400 10e3/uL   MCV 97.0  79.3 - 98.0 fL   MCH 32.2  27.2 - 33.4 pg   MCHC 33.2  32.0 - 36.0 g/dL   RBC 2.13  0.86 - 5.78 10e6/uL   RDW 13.8  11.0 - 14.6 %   lymph# 30.1 (*) 0.9 - 3.3 10e3/uL   MONO# 0.5  0.1 - 0.9 10e3/uL   Eosinophils Absolute 0.1  0.0 - 0.5 10e3/uL   Basophils Absolute 0.1  0.0 - 0.1 10e3/uL   NEUT% 9.4 (*) 39.0 - 75.0 %   LYMPH% 88.7 (*) 14.0 - 49.0 %   MONO% 1.5  0.0 - 14.0 %   EOS% 0.3  0.0 - 7.0 %   BASO% 0.1  0.0 - 2.0 %  TECHNOLOGIST REVIEW     Status: None   Collection Time    06/14/13 11:04 AM      Result Value Range   Technologist Review Few Smudge cells, Variant lymphs present    COMPREHENSIVE METABOLIC PANEL (CC13)     Status: Abnormal   Collection Time    06/14/13 11:04 AM      Result Value Range   Sodium 140  136 - 145 mEq/L   Potassium 4.2  3.5 - 5.1 mEq/L   Chloride 100  98 - 109 mEq/L   CO2 33 (*) 22 - 29 mEq/L   Glucose 91  70 - 140 mg/dl   BUN 46.9  7.0 - 62.9 mg/dL   Creatinine 1.0  0.7 - 1.3 mg/dL   Total Bilirubin 5.28 (*) 0.20 - 1.20 mg/dL   Alkaline Phosphatase 74  40 - 150 U/L   AST 23  5 - 34 U/L   ALT 25  0 - 55 U/L   Total Protein 6.4  6.4 - 8.3 g/dL   Albumin 4.0  3.5 - 5.0 g/dL   Calcium 9.2  8.4 - 41.3 mg/dL  LACTATE DEHYDROGENASE (CC13)     Status: None   Collection Time    06/14/13 11:04 AM  Result Value Range   LDH 194  125 - 245 U/L         Assessment & Plan:

## 2013-08-04 NOTE — Assessment & Plan Note (Signed)
Reviewed lab since 2009 - very stable with controlled LDL  Plan Continue present medication  Lipid panel with [facility] lab draw at [facility].

## 2013-08-04 NOTE — Patient Instructions (Addendum)
Good to see you and Happy 80/60.  Your exam is fine except for the left eye. Labs in July were great  Please come back as needed or in 1 year.

## 2013-08-04 NOTE — Assessment & Plan Note (Signed)
Saw Dr. Arbutus Ped in July - WBC 33.4. He is stable

## 2013-08-10 ENCOUNTER — Other Ambulatory Visit: Payer: Self-pay

## 2013-08-10 MED ORDER — METOPROLOL SUCCINATE ER 50 MG PO TB24: 50.0000 mg | ORAL_TABLET | Freq: Every day | ORAL | Status: DC

## 2013-10-15 DIAGNOSIS — H2510 Age-related nuclear cataract, unspecified eye: Secondary | ICD-10-CM | POA: Diagnosis not present

## 2013-10-21 DIAGNOSIS — H2589 Other age-related cataract: Secondary | ICD-10-CM | POA: Diagnosis not present

## 2013-10-21 DIAGNOSIS — C9110 Chronic lymphocytic leukemia of B-cell type not having achieved remission: Secondary | ICD-10-CM | POA: Diagnosis not present

## 2013-10-21 DIAGNOSIS — I10 Essential (primary) hypertension: Secondary | ICD-10-CM | POA: Diagnosis not present

## 2013-10-21 DIAGNOSIS — E7800 Pure hypercholesterolemia, unspecified: Secondary | ICD-10-CM | POA: Diagnosis not present

## 2013-10-22 HISTORY — PX: EYE SURGERY: SHX253

## 2013-10-27 DIAGNOSIS — H26499 Other secondary cataract, unspecified eye: Secondary | ICD-10-CM | POA: Insufficient documentation

## 2013-11-03 ENCOUNTER — Ambulatory Visit (INDEPENDENT_AMBULATORY_CARE_PROVIDER_SITE_OTHER): Payer: Medicare Other

## 2013-11-03 VITALS — BP 138/88

## 2013-11-03 DIAGNOSIS — I10 Essential (primary) hypertension: Secondary | ICD-10-CM | POA: Diagnosis not present

## 2013-12-10 ENCOUNTER — Other Ambulatory Visit: Payer: Self-pay | Admitting: Internal Medicine

## 2013-12-10 ENCOUNTER — Other Ambulatory Visit (HOSPITAL_BASED_OUTPATIENT_CLINIC_OR_DEPARTMENT_OTHER): Payer: Medicare Other

## 2013-12-10 DIAGNOSIS — C9110 Chronic lymphocytic leukemia of B-cell type not having achieved remission: Secondary | ICD-10-CM

## 2013-12-10 DIAGNOSIS — E785 Hyperlipidemia, unspecified: Secondary | ICD-10-CM | POA: Diagnosis not present

## 2013-12-10 LAB — CBC WITH DIFFERENTIAL/PLATELET
BASO%: 0.2 % (ref 0.0–2.0)
BASOS ABS: 0.1 10*3/uL (ref 0.0–0.1)
EOS ABS: 0.1 10*3/uL (ref 0.0–0.5)
EOS%: 0.3 % (ref 0.0–7.0)
HCT: 45.7 % (ref 38.4–49.9)
HEMOGLOBIN: 15.2 g/dL (ref 13.0–17.1)
LYMPH%: 88.2 % — AB (ref 14.0–49.0)
MCH: 32.2 pg (ref 27.2–33.4)
MCHC: 33.2 g/dL (ref 32.0–36.0)
MCV: 97.1 fL (ref 79.3–98.0)
MONO#: 0.4 10*3/uL (ref 0.1–0.9)
MONO%: 1.3 % (ref 0.0–14.0)
NEUT%: 10 % — ABNORMAL LOW (ref 39.0–75.0)
NEUTROS ABS: 3.2 10*3/uL (ref 1.5–6.5)
PLATELETS: 159 10*3/uL (ref 140–400)
RBC: 4.7 10*6/uL (ref 4.20–5.82)
RDW: 14 % (ref 11.0–14.6)
WBC: 31.5 10*3/uL — AB (ref 4.0–10.3)
lymph#: 27.7 10*3/uL — ABNORMAL HIGH (ref 0.9–3.3)

## 2013-12-10 LAB — COMPREHENSIVE METABOLIC PANEL (CC13)
ALBUMIN: 4.2 g/dL (ref 3.5–5.0)
ALT: 20 U/L (ref 0–55)
ANION GAP: 12 meq/L — AB (ref 3–11)
AST: 21 U/L (ref 5–34)
Alkaline Phosphatase: 87 U/L (ref 40–150)
BUN: 18.1 mg/dL (ref 7.0–26.0)
CO2: 29 meq/L (ref 22–29)
Calcium: 9.1 mg/dL (ref 8.4–10.4)
Chloride: 102 mEq/L (ref 98–109)
Creatinine: 1 mg/dL (ref 0.7–1.3)
GLUCOSE: 99 mg/dL (ref 70–140)
POTASSIUM: 4.2 meq/L (ref 3.5–5.1)
SODIUM: 143 meq/L (ref 136–145)
TOTAL PROTEIN: 6.9 g/dL (ref 6.4–8.3)
Total Bilirubin: 1.56 mg/dL — ABNORMAL HIGH (ref 0.20–1.20)

## 2013-12-10 LAB — LIPID PANEL
CHOLESTEROL: 134 mg/dL (ref 0–200)
HDL: 54 mg/dL (ref >39)
LDL Cholesterol: 48 mg/dL (ref 0–99)
Total CHOL/HDL Ratio: 2.5 Ratio
Triglycerides: 162 mg/dL — ABNORMAL HIGH (ref <150)
VLDL: 32 mg/dL (ref 0–40)

## 2013-12-10 LAB — TECHNOLOGIST REVIEW

## 2013-12-10 LAB — LACTATE DEHYDROGENASE (CC13): LDH: 187 U/L (ref 125–245)

## 2013-12-15 ENCOUNTER — Encounter: Payer: Self-pay | Admitting: Internal Medicine

## 2013-12-15 ENCOUNTER — Telehealth: Payer: Self-pay | Admitting: Internal Medicine

## 2013-12-15 ENCOUNTER — Ambulatory Visit (HOSPITAL_BASED_OUTPATIENT_CLINIC_OR_DEPARTMENT_OTHER): Payer: Medicare Other | Admitting: Internal Medicine

## 2013-12-15 VITALS — BP 145/61 | HR 55 | Temp 97.4°F | Resp 18 | Ht 66.0 in | Wt 165.3 lb

## 2013-12-15 DIAGNOSIS — C9110 Chronic lymphocytic leukemia of B-cell type not having achieved remission: Secondary | ICD-10-CM

## 2013-12-15 NOTE — Patient Instructions (Signed)
Followup visit in 6 months with repeat CBC, comprehensive metabolic panel and LDH.

## 2013-12-15 NOTE — Progress Notes (Signed)
[place] Telephone:(336) [phone]   Fax:(336) [phone]  OFFICE PROGRESS NOTE  Adella Hare, MD [address] [place] [address]  DIAGNOSIS: Stage 0 Chronic lymphocytic leukemia   PRIOR THERAPY: None   CURRENT THERAPY: Observation  INTERVAL HISTORY: Jerry Gomez 80 y.o. male returns to the clinic today for six-month followup visit. The patient is feeling fine today with no specific complaints. He denied having any significant weight loss or night sweats. He denied having any palpable lymphadenopathy. The patient denied having any significant chest pain, shortness of breath, cough or hemoptysis. He would have repeat CBC performed earlier today and he is here for evaluation and discussion of his lab results.  MEDICAL HISTORY: Past Medical History  Diagnosis Date  . COLONIC POLYPS 03/02/2009  . EYE SURGERY, HX OF 09/05/2007  . HEMORRHOIDS, HX OF 09/05/2007  . HYPERLIPIDEMIA 09/05/2007  . HYPERTENSION 07/25/2007  . OSTEOARTHRITIS 09/05/2007  . SLEEP APNEA, OBSTRUCTIVE 09/05/2007  . CLL (chronic lymphocytic leukemia) 2010    stable    ALLERGIES:  has No Known Allergies.  MEDICATIONS:  Current Outpatient Prescriptions  Medication Sig Dispense Refill  . aspirin 325 MG tablet Take 325 mg by mouth daily.        [name] atorvastatin (LIPITOR) 20 MG tablet Take 1 tablet (20 mg total) by mouth daily.  90 tablet  3  . furosemide (LASIX) 40 MG tablet Take 1 tablet (40 mg total) by mouth daily.  90 tablet  3  . metoprolol succinate (TOPROL-XL) 50 MG 24 hr tablet Take 1 tablet (50 mg total) by mouth daily.  90 tablet  3  . Multiple Vitamin (MULTIVITAMIN) tablet Take 1 tablet by mouth daily.        . sildenafil (VIAGRA) 100 MG tablet Take 100 mg by mouth daily as needed.         No current facility-administered medications for this visit.    SURGICAL HISTORY:  Past Surgical History  Procedure Laterality Date  . Eye surgery      REVIEW OF SYSTEMS:  A comprehensive  review of systems was negative.   PHYSICAL EXAMINATION: General appearance: alert, cooperative and no distress Head: Normocephalic, without obvious abnormality, atraumatic Neck: no adenopathy Lymph nodes: Cervical, supraclavicular, and axillary nodes normal. Resp: clear to auscultation bilaterally Cardio: regular rate and rhythm, S1, S2 normal, no murmur, click, rub or gallop GI: soft, non-tender; bowel sounds normal; no masses,  no organomegaly Extremities: extremities normal, atraumatic, no cyanosis or edema  ECOG PERFORMANCE STATUS: 1 - Symptomatic but completely ambulatory  Blood pressure 145/61, pulse 55, temperature 97.4 F (36.3 C), temperature source Oral, resp. rate 18, height 5\' 6"  (1.676 m), weight 165 lb 4.8 oz (74.98 kg), SpO2 100.00%.  LABORATORY DATA: Lab Results  Component Value Date   WBC 31.5* 12/10/2013   HGB 15.2 12/10/2013   HCT 45.7 12/10/2013   MCV 97.1 12/10/2013   PLT 159 12/10/2013      Chemistry      Component Value Date/Time   NA 143 12/10/2013 1506   NA 138 07/20/2012 1721   K 4.2 12/10/2013 1506   K 3.9 07/20/2012 1721   CL 104 12/14/2012 1031   CL 97 07/20/2012 1721   CO2 29 12/10/2013 1506   CO2 30 07/20/2012 1721   BUN 18.1 12/10/2013 1506   BUN 14 07/20/2012 1721   CREATININE 1.0 12/10/2013 1506   CREATININE 0.9 07/20/2012 1721      Component Value  Date/Time   CALCIUM 9.1 12/10/2013 1506   CALCIUM 8.9 07/20/2012 1721   ALKPHOS 87 12/10/2013 1506   ALKPHOS 88 07/20/2012 1721   AST 21 12/10/2013 1506   AST 30 07/20/2012 1721   ALT 20 12/10/2013 1506   ALT 30 07/20/2012 1721   BILITOT 1.56* 12/10/2013 1506   BILITOT 1.9* 07/20/2012 1721       RADIOGRAPHIC STUDIES: No results found.  ASSESSMENT AND PLAN: This is a very pleasant 80 years old white male with history of stage 0 chronic lymphocytic leukemia currently on observation. The patient is doing fine with no significant evidence for disease progression. I discussed the lab result with the patient.  I recommended  for him to continue on observation with repeat CBC, comprehensive metabolic panel and LDH in 6 months.  All questions were answered. The patient knows to call the clinic with any problems, questions or concerns. We can certainly see the patient much sooner if necessary.

## 2013-12-15 NOTE — Telephone Encounter (Signed)
GV AND PRINTED APPT SCHED AND AVS FOR PT FOR jULY

## 2014-02-04 DIAGNOSIS — Z961 Presence of intraocular lens: Secondary | ICD-10-CM | POA: Diagnosis not present

## 2014-02-21 ENCOUNTER — Ambulatory Visit (INDEPENDENT_AMBULATORY_CARE_PROVIDER_SITE_OTHER): Payer: Medicare Other | Admitting: Internal Medicine

## 2014-02-21 ENCOUNTER — Encounter: Payer: Self-pay | Admitting: Internal Medicine

## 2014-02-21 VITALS — BP 110/60 | HR 61 | Temp 97.1°F | Wt 165.4 lb

## 2014-02-21 DIAGNOSIS — E785 Hyperlipidemia, unspecified: Secondary | ICD-10-CM

## 2014-02-21 DIAGNOSIS — C9110 Chronic lymphocytic leukemia of B-cell type not having achieved remission: Secondary | ICD-10-CM | POA: Diagnosis not present

## 2014-02-21 DIAGNOSIS — I10 Essential (primary) hypertension: Secondary | ICD-10-CM

## 2014-02-21 MED ORDER — ATORVASTATIN CALCIUM 20 MG PO TABS: 20.0000 mg | ORAL_TABLET | Freq: Every day | ORAL | Status: DC

## 2014-02-21 NOTE — Progress Notes (Signed)
Pre visit review using our clinic review tool, if applicable. No additional management support is needed unless otherwise documented below in the visit note. 

## 2014-02-21 NOTE — Assessment & Plan Note (Signed)
Very stable and doing well.   Plan Per Dr. Julien Nordmann

## 2014-02-21 NOTE — Assessment & Plan Note (Signed)
Good control on present medication. Last Bmet normal  Plan Continue present medications

## 2014-02-21 NOTE — Progress Notes (Signed)
Subjective:    Patient ID: Jerry Gomez, male    DOB: Feb 11, 1931, 80 y.o.   MRN: [id number]  HPI Mr. Becvar presents for routine medical follow-up. He has been doing well. Reviewed recent office note from [facility] - good report re: grade 0 CLL and his lab work was fine.    Past Medical History  Diagnosis Date  . COLONIC POLYPS 03/02/2009  . EYE SURGERY, HX OF 09/05/2007  . HEMORRHOIDS, HX OF 09/05/2007  . HYPERLIPIDEMIA 09/05/2007  . HYPERTENSION 07/25/2007  . OSTEOARTHRITIS 09/05/2007  . SLEEP APNEA, OBSTRUCTIVE 09/05/2007  . CLL (chronic lymphocytic leukemia) 2010    stable   Past Surgical History  Procedure Laterality Date  . Eye surgery  10/22/13    left eye, cataract   Family History  Problem Relation Age of Onset  . Kidney disease Father   . Cancer Father     prostate-late on-set  . Cancer Brother     non-hodgkins lymphoma  . Colon cancer Neg Hx   . Esophageal cancer Neg Hx   . Rectal cancer Neg Hx   . Stomach cancer Neg Hx    History   Social History  . Marital Status: Married    Spouse Name: N/A    Number of Children: 2  . Years of Education: 13   Occupational History  . retired    Social History Main Topics  . Smoking status: Former Smoker    Types: Cigarettes    Quit date: 02/07/1963  . Smokeless tobacco: Never Used  . Alcohol Use: 7.0 oz/week    14 drink(s) per week     Comment: wine  . Drug Use: No  . Sexual Activity: Yes    Partners: Female   Other Topics Concern  . 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 2 sons ' 63, '67. 2 grandchildren.   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 CPR- yes, short-term mechanical ventilation, no prolonged heroic or futile measures.     Current Outpatient Prescriptions on File Prior to Visit  Medication Sig Dispense Refill  . aspirin 325 MG tablet Take 325 mg by mouth daily.        . furosemide (LASIX) 40 MG tablet Take 1 tablet  (40 mg total) by mouth daily.  90 tablet  3  . metoprolol succinate (TOPROL-XL) 50 MG 24 hr tablet Take 1 tablet (50 mg total) by mouth daily.  90 tablet  3  . Multiple Vitamin (MULTIVITAMIN) tablet Take 1 tablet by mouth daily.        . sildenafil (VIAGRA) 100 MG tablet Take 100 mg by mouth daily as needed.         No current facility-administered medications on file prior to visit.      Review of Systems System review is negative for any constitutional, cardiac, pulmonary, GI or neuro symptoms or complaints other than as described in the HPI.     Objective:   Physical Exam Filed Vitals:   02/21/14 1359  BP: 110/60  Pulse: 61  Temp: 97.1 F (36.2 C)   Wt Readings from Last 3 Encounters:  02/21/14 165 lb 6.4 oz (75.025 kg)  12/15/13 165 lb 4.8 oz (74.98 kg)  08/04/13 165 lb 6.4 oz (75.025 kg)   BP Readings from Last 3 Encounters:  02/21/14 110/60  12/15/13 145/61  11/03/13 138/88   Gen'l- WNWD man looking younger than  his stated age Cor- 2+ radial pulse Pulm - CTAP Neuro - A&O x 3        Assessment & Plan:  PCP referral - he is to work with the scheduling team but I have recommended Dr. Alain Marion.

## 2014-02-21 NOTE — Assessment & Plan Note (Signed)
Taking and tolerating "statin" therapy. Last LDL  Jan '15 = 84!!  Plan  Renewed Rx for atorvastatin.  F/u lab in 1 year

## 2014-02-22 ENCOUNTER — Telehealth: Payer: Self-pay | Admitting: Internal Medicine

## 2014-02-22 NOTE — Telephone Encounter (Signed)
Relevant patient education assigned to patient using Emmi. ° °

## 2014-03-16 DIAGNOSIS — L723 Sebaceous cyst: Secondary | ICD-10-CM | POA: Diagnosis not present

## 2014-03-16 DIAGNOSIS — Z85828 Personal history of other malignant neoplasm of skin: Secondary | ICD-10-CM | POA: Diagnosis not present

## 2014-03-16 DIAGNOSIS — L820 Inflamed seborrheic keratosis: Secondary | ICD-10-CM | POA: Diagnosis not present

## 2014-03-16 DIAGNOSIS — D1801 Hemangioma of skin and subcutaneous tissue: Secondary | ICD-10-CM | POA: Diagnosis not present

## 2014-03-16 DIAGNOSIS — L570 Actinic keratosis: Secondary | ICD-10-CM | POA: Diagnosis not present

## 2014-03-16 DIAGNOSIS — D2370 Other benign neoplasm of skin of unspecified lower limb, including hip: Secondary | ICD-10-CM | POA: Diagnosis not present

## 2014-05-04 ENCOUNTER — Encounter: Payer: Self-pay | Admitting: Internal Medicine

## 2014-05-04 ENCOUNTER — Ambulatory Visit (INDEPENDENT_AMBULATORY_CARE_PROVIDER_SITE_OTHER): Payer: Medicare Other | Admitting: Internal Medicine

## 2014-05-04 VITALS — BP 140/72 | HR 68 | Temp 97.5°F | Resp 16 | Wt 165.0 lb

## 2014-05-04 DIAGNOSIS — E785 Hyperlipidemia, unspecified: Secondary | ICD-10-CM | POA: Diagnosis not present

## 2014-05-04 DIAGNOSIS — G4733 Obstructive sleep apnea (adult) (pediatric): Secondary | ICD-10-CM

## 2014-05-04 DIAGNOSIS — C9110 Chronic lymphocytic leukemia of B-cell type not having achieved remission: Secondary | ICD-10-CM

## 2014-05-04 DIAGNOSIS — I10 Essential (primary) hypertension: Secondary | ICD-10-CM | POA: Diagnosis not present

## 2014-05-04 MED ORDER — FUROSEMIDE 40 MG PO TABS: 40.0000 mg | ORAL_TABLET | Freq: Every day | ORAL | Status: DC

## 2014-05-04 NOTE — Progress Notes (Signed)
Pre visit review using our clinic review tool, if applicable. No additional management support is needed unless otherwise documented below in the visit note. 

## 2014-05-04 NOTE — Assessment & Plan Note (Signed)
Continue with current prescription therapy as reflected on the Med list.  

## 2014-05-04 NOTE — Assessment & Plan Note (Signed)
Off CPAP since 1/15 - wt loss

## 2014-05-04 NOTE — Progress Notes (Signed)
   Subjective:    Patient ID: Jerry Gomez, male    DOB: 19-Mar-1931, 80 y.o.   MRN: [id number]  HPI New pt - Dr MEN  He has been doing well. Dr. Julien Nordmann - good report re: grade 0 CLL and his lab work was fine.     Review of Systems     Objective:   Physical Exam        Assessment & Plan:

## 2014-05-04 NOTE — Assessment & Plan Note (Signed)
F/u w/Dr Mohamed 

## 2014-06-07 DIAGNOSIS — H2640 Unspecified secondary cataract: Secondary | ICD-10-CM | POA: Diagnosis not present

## 2014-06-07 DIAGNOSIS — Z961 Presence of intraocular lens: Secondary | ICD-10-CM | POA: Diagnosis not present

## 2014-06-07 DIAGNOSIS — H35379 Puckering of macula, unspecified eye: Secondary | ICD-10-CM | POA: Diagnosis not present

## 2014-06-07 DIAGNOSIS — H2510 Age-related nuclear cataract, unspecified eye: Secondary | ICD-10-CM | POA: Diagnosis not present

## 2014-06-07 DIAGNOSIS — H35359 Cystoid macular degeneration, unspecified eye: Secondary | ICD-10-CM | POA: Diagnosis not present

## 2014-06-15 ENCOUNTER — Encounter: Payer: Self-pay | Admitting: Internal Medicine

## 2014-06-15 ENCOUNTER — Other Ambulatory Visit (HOSPITAL_BASED_OUTPATIENT_CLINIC_OR_DEPARTMENT_OTHER): Payer: Medicare Other

## 2014-06-15 ENCOUNTER — Telehealth: Payer: Self-pay | Admitting: Internal Medicine

## 2014-06-15 ENCOUNTER — Ambulatory Visit (HOSPITAL_BASED_OUTPATIENT_CLINIC_OR_DEPARTMENT_OTHER): Payer: Medicare Other | Admitting: Internal Medicine

## 2014-06-15 VITALS — BP 124/55 | HR 56 | Temp 97.5°F | Resp 18 | Ht 66.0 in | Wt 167.7 lb

## 2014-06-15 DIAGNOSIS — C9110 Chronic lymphocytic leukemia of B-cell type not having achieved remission: Secondary | ICD-10-CM

## 2014-06-15 LAB — CBC WITH DIFFERENTIAL/PLATELET
BASO%: 0.2 % (ref 0.0–2.0)
BASOS ABS: 0.1 10*3/uL (ref 0.0–0.1)
EOS ABS: 0.1 10*3/uL (ref 0.0–0.5)
EOS%: 0.3 % (ref 0.0–7.0)
HEMATOCRIT: 44 % (ref 38.4–49.9)
HEMOGLOBIN: 14.2 g/dL (ref 13.0–17.1)
LYMPH%: 90.4 % — AB (ref 14.0–49.0)
MCH: 31.6 pg (ref 27.2–33.4)
MCHC: 32.3 g/dL (ref 32.0–36.0)
MCV: 97.6 fL (ref 79.3–98.0)
MONO#: 0.5 10*3/uL (ref 0.1–0.9)
MONO%: 1.2 % (ref 0.0–14.0)
NEUT%: 7.9 % — AB (ref 39.0–75.0)
NEUTROS ABS: 3 10*3/uL (ref 1.5–6.5)
PLATELETS: 123 10*3/uL — AB (ref 140–400)
RBC: 4.51 10*6/uL (ref 4.20–5.82)
RDW: 13.3 % (ref 11.0–14.6)
WBC: 38.3 10*3/uL — AB (ref 4.0–10.3)
lymph#: 34.6 10*3/uL — ABNORMAL HIGH (ref 0.9–3.3)

## 2014-06-15 LAB — COMPREHENSIVE METABOLIC PANEL (CC13)
ALT: 22 U/L (ref 0–55)
ANION GAP: 7 meq/L (ref 3–11)
AST: 22 U/L (ref 5–34)
Albumin: 4 g/dL (ref 3.5–5.0)
Alkaline Phosphatase: 74 U/L (ref 40–150)
BILIRUBIN TOTAL: 1.78 mg/dL — AB (ref 0.20–1.20)
BUN: 16.3 mg/dL (ref 7.0–26.0)
CO2: 29 meq/L (ref 22–29)
Calcium: 9.1 mg/dL (ref 8.4–10.4)
Chloride: 105 mEq/L (ref 98–109)
Creatinine: 0.9 mg/dL (ref 0.7–1.3)
GLUCOSE: 139 mg/dL (ref 70–140)
Potassium: 4.3 mEq/L (ref 3.5–5.1)
Sodium: 141 mEq/L (ref 136–145)
TOTAL PROTEIN: 6.3 g/dL — AB (ref 6.4–8.3)

## 2014-06-15 LAB — TECHNOLOGIST REVIEW

## 2014-06-15 LAB — LACTATE DEHYDROGENASE (CC13): LDH: 184 U/L (ref 125–245)

## 2014-06-15 NOTE — Telephone Encounter (Signed)
gv pt appt schedule for jan 2016 °

## 2014-06-15 NOTE — Progress Notes (Signed)
[facility] Telephone:(336) [phone]   Fax:(336) [phone]  OFFICE PROGRESS NOTE  Walker Kehr, MD [place] [address]  DIAGNOSIS: Stage 0 Chronic lymphocytic leukemia   PRIOR THERAPY: None   CURRENT THERAPY: Observation  INTERVAL HISTORY: Jerry Gomez 80 y.o. male returns to the clinic today for six-month followup visit. The patient is feeling fine today with no specific complaints. He denied having any significant weight loss or night sweats. He denied having any palpable lymphadenopathy. The patient denied having any significant chest pain, shortness of breath, cough or hemoptysis. He had repeat CBC performed earlier today and he is here for evaluation and discussion of his lab results.  MEDICAL HISTORY: Past Medical History  Diagnosis Date  . COLONIC POLYPS 03/02/2009  . EYE SURGERY, HX OF 09/05/2007  . HEMORRHOIDS, HX OF 09/05/2007  . HYPERLIPIDEMIA 09/05/2007  . HYPERTENSION 07/25/2007  . OSTEOARTHRITIS 09/05/2007  . SLEEP APNEA, OBSTRUCTIVE 09/05/2007  . CLL (chronic lymphocytic leukemia) 2010    stable    ALLERGIES:  has No Known Allergies.  MEDICATIONS:  Current Outpatient Prescriptions  Medication Sig Dispense Refill  . aspirin 325 MG tablet Take 325 mg by mouth daily.        [name] atorvastatin (LIPITOR) 20 MG tablet Take 1 tablet (20 mg total) by mouth daily.  90 tablet  3  . furosemide (LASIX) 40 MG tablet Take 1 tablet (40 mg total) by mouth daily.  90 tablet  3  . metoprolol succinate (TOPROL-XL) 50 MG 24 hr tablet Take 1 tablet (50 mg total) by mouth daily.  90 tablet  3  . Multiple Vitamin (MULTIVITAMIN) tablet Take 1 tablet by mouth daily.        . sildenafil (VIAGRA) 100 MG tablet Take 100 mg by mouth daily as needed.         No current facility-administered medications for this visit.    SURGICAL HISTORY:  Past Surgical History  Procedure Laterality Date  . Eye surgery  10/22/13    left eye, cataract    REVIEW OF  SYSTEMS:  A comprehensive review of systems was negative.   PHYSICAL EXAMINATION: General appearance: alert, cooperative and no distress Head: Normocephalic, without obvious abnormality, atraumatic Neck: no adenopathy Lymph nodes: Cervical, supraclavicular, and axillary nodes normal. Resp: clear to auscultation bilaterally Cardio: regular rate and rhythm, S1, S2 normal, no murmur, click, rub or gallop GI: soft, non-tender; bowel sounds normal; no masses,  no organomegaly Extremities: extremities normal, atraumatic, no cyanosis or edema  ECOG PERFORMANCE STATUS: 1 - Symptomatic but completely ambulatory  Blood pressure 124/55, pulse 56, temperature 97.5 F (36.4 C), temperature source Oral, resp. rate 18, height 5\' 6"  (1.676 m), weight 167 lb 11.2 oz (76.068 kg), SpO2 99.00%.  LABORATORY DATA: Lab Results  Component Value Date   WBC 38.3* 06/15/2014   HGB 14.2 06/15/2014   HCT 44.0 06/15/2014   MCV 97.6 06/15/2014   PLT 123* 06/15/2014      Chemistry      Component Value Date/Time   NA 141 06/15/2014 0946   NA 138 07/20/2012 1721   K 4.3 06/15/2014 0946   K 3.9 07/20/2012 1721   CL 104 12/14/2012 1031   CL 97 07/20/2012 1721   CO2 29 06/15/2014 0946   CO2 30 07/20/2012 1721   BUN 16.3 06/15/2014 0946   BUN 14 07/20/2012 1721   CREATININE 0.9 06/15/2014 0946   CREATININE 0.9 07/20/2012 1721  Component Value Date/Time   CALCIUM 9.1 06/15/2014 0946   CALCIUM 8.9 07/20/2012 1721   ALKPHOS 74 06/15/2014 0946   ALKPHOS 88 07/20/2012 1721   AST 22 06/15/2014 0946   AST 30 07/20/2012 1721   ALT 22 06/15/2014 0946   ALT 30 07/20/2012 1721   BILITOT 1.78* 06/15/2014 0946   BILITOT 1.9* 07/20/2012 1721       RADIOGRAPHIC STUDIES: No results found.  ASSESSMENT AND PLAN: This is a very pleasant 80 years old white male with history of stage 0 chronic lymphocytic leukemia currently on observation. The patient is doing fine with no significant evidence for disease progression.  I discussed the lab result with  the patient. He is currently asymptomatic. I recommended for him to continue on observation with repeat CBC, comprehensive metabolic panel and LDH in 6 months.  All questions were answered. The patient knows to call the clinic with any problems, questions or concerns. We can certainly see the patient much sooner if necessary.  Disclaimer: This note was dictated with voice recognition software. Similar sounding words can inadvertently be transcribed and may not be corrected upon review.

## 2014-06-15 NOTE — Telephone Encounter (Signed)
add to previous note - per 06/15/14 pof and pt appts should be 6 months. fyi - expected date for lab order july 2016.

## 2014-08-01 ENCOUNTER — Telehealth: Payer: Self-pay | Admitting: Internal Medicine

## 2014-08-01 NOTE — Telephone Encounter (Signed)
Pt came by to request Rx refill for METROPROLOL ER(SUCC) 50MG  TAB. Pt states he out of refills for this med and needs a new authorization. Pt needs request sent to [facility] ([email or web address]). Please contact pt when request is reviewed.

## 2014-08-02 MED ORDER — METOPROLOL SUCCINATE ER 50 MG PO TB24: 50.0000 mg | ORAL_TABLET | Freq: Every day | ORAL | Status: DC

## 2014-08-02 NOTE — Telephone Encounter (Signed)
Done. Left detailed mess informing pt.  

## 2014-09-08 ENCOUNTER — Ambulatory Visit (INDEPENDENT_AMBULATORY_CARE_PROVIDER_SITE_OTHER): Payer: Medicare Other

## 2014-09-08 DIAGNOSIS — Z23 Encounter for immunization: Secondary | ICD-10-CM | POA: Diagnosis not present

## 2014-10-10 ENCOUNTER — Encounter: Payer: Self-pay | Admitting: Internal Medicine

## 2014-10-10 ENCOUNTER — Ambulatory Visit (INDEPENDENT_AMBULATORY_CARE_PROVIDER_SITE_OTHER): Payer: Medicare Other | Admitting: Internal Medicine

## 2014-10-10 VITALS — BP 112/68 | HR 65 | Temp 98.0°F | Ht 67.0 in | Wt 166.0 lb

## 2014-10-10 DIAGNOSIS — N320 Bladder-neck obstruction: Secondary | ICD-10-CM | POA: Diagnosis not present

## 2014-10-10 DIAGNOSIS — T148 Other injury of unspecified body region: Secondary | ICD-10-CM

## 2014-10-10 DIAGNOSIS — Z23 Encounter for immunization: Secondary | ICD-10-CM | POA: Diagnosis not present

## 2014-10-10 DIAGNOSIS — I10 Essential (primary) hypertension: Secondary | ICD-10-CM

## 2014-10-10 DIAGNOSIS — Z0000 Encounter for general adult medical examination without abnormal findings: Secondary | ICD-10-CM | POA: Diagnosis not present

## 2014-10-10 DIAGNOSIS — C9110 Chronic lymphocytic leukemia of B-cell type not having achieved remission: Secondary | ICD-10-CM | POA: Diagnosis not present

## 2014-10-10 DIAGNOSIS — E785 Hyperlipidemia, unspecified: Secondary | ICD-10-CM | POA: Diagnosis not present

## 2014-10-10 DIAGNOSIS — T148XXA Other injury of unspecified body region, initial encounter: Secondary | ICD-10-CM

## 2014-10-10 NOTE — Patient Instructions (Signed)

## 2014-10-10 NOTE — Assessment & Plan Note (Signed)
Continue with current prescription therapy as reflected on the Med list. Labs  

## 2014-10-10 NOTE — Progress Notes (Signed)
Pre visit review using our clinic review tool, if applicable. No additional management support is needed unless otherwise documented below in the visit note. 

## 2014-10-10 NOTE — Assessment & Plan Note (Signed)
Continue with current prescription therapy as reflected on the Med list.  

## 2014-10-10 NOTE — Progress Notes (Signed)
Subjective:    Patient ID: Jerry Gomez, male    DOB: Mar 20, 1931, 80 y.o.   MRN: [id number]  HPI  The patient is here for a wellness exam. The patient has been doing well overall without major physical or psychological issues going on lately. The patient needs to address  chronic hypertension that has been well controlled with medicines; to address chronic  hyperlipidemia controlled with medicines as well; and to address ED controlled with medical treatment and diet.   He has been doing well. Dr. Julien Nordmann - good report re: grade 0 CLL and his lab work was fine.   BP Readings from Last 3 Encounters:  10/10/14 112/68  06/15/14 124/55  05/04/14 140/72   Wt Readings from Last 3 Encounters:  10/10/14 166 lb (75.297 kg)  06/15/14 167 lb 11.2 oz (76.068 kg)  05/04/14 165 lb (74.844 kg)      Review of Systems  Constitutional: Negative for appetite change, fatigue and unexpected weight change.  HENT: Negative for congestion, nosebleeds, sneezing, sore throat and trouble swallowing.   Eyes: Negative for itching and visual disturbance.  Respiratory: Negative for cough.   Cardiovascular: Negative for chest pain, palpitations and leg swelling.  Gastrointestinal: Negative for nausea, diarrhea, blood in stool and abdominal distention.  Genitourinary: Negative for frequency and hematuria.  Musculoskeletal: Negative for back pain, joint swelling, gait problem and neck pain.  Skin: Negative for rash and wound.  Neurological: Negative for dizziness, tremors, speech difficulty and weakness.  Psychiatric/Behavioral: Negative for suicidal ideas, sleep disturbance, dysphoric mood and agitation. The patient is not nervous/anxious.        Objective:   Physical Exam  Constitutional: He is oriented to person, place, and time. He appears well-developed and well-nourished. No distress.  HENT:  Head: Normocephalic and atraumatic.  Right Ear: External ear normal.  Left Ear: External ear normal.    Nose: Nose normal.  Mouth/Throat: Oropharynx is clear and moist. No oropharyngeal exudate.  Eyes: Conjunctivae and EOM are normal. Pupils are equal, round, and reactive to light. Right eye exhibits no discharge. Left eye exhibits no discharge. No scleral icterus.  Neck: Normal range of motion. Neck supple. No JVD present. No tracheal deviation present. No thyromegaly present.  Cardiovascular: Normal rate, regular rhythm, normal heart sounds and intact distal pulses.  Exam reveals no gallop and no friction rub.   No murmur heard. Pulmonary/Chest: Effort normal and breath sounds normal. No stridor. No respiratory distress. He has no wheezes. He has no rales. He exhibits no tenderness.  Abdominal: Soft. Bowel sounds are normal. He exhibits no distension and no mass. There is no tenderness. There is no rebound and no guarding.  Genitourinary: Rectum normal, prostate normal and penis normal. Guaiac negative stool. No penile tenderness.  Musculoskeletal: Normal range of motion. He exhibits no edema or tenderness.  Lymphadenopathy:    He has no cervical adenopathy.  Neurological: He is alert and oriented to person, place, and time. He has normal reflexes. No cranial nerve deficit. He exhibits normal muscle tone. Coordination normal.  Skin: Skin is warm and dry. No rash noted. He is not diaphoretic. No erythema. No pallor.  Psychiatric: He has a normal mood and affect. His behavior is normal. Judgment and thought content normal.    Lab Results  Component Value Date   WBC 38.3* 06/15/2014   HGB 14.2 06/15/2014   HCT 44.0 06/15/2014   PLT 123* 06/15/2014   GLUCOSE 139 06/15/2014   CHOL 134 12/10/2013  TRIG 162* 12/10/2013   HDL 54 12/10/2013   LDLCALC 48 12/10/2013   ALT 22 06/15/2014   AST 22 06/15/2014   NA 141 06/15/2014   K 4.3 06/15/2014   CL 104 12/14/2012   CREATININE 0.9 06/15/2014   BUN 16.3 06/15/2014   CO2 29 06/15/2014   TSH 1.48 04/09/2010   PSA 6.18* 01/11/2008          Assessment & Plan:

## 2014-10-10 NOTE — Assessment & Plan Note (Signed)
Chronic ?age related CBC

## 2014-10-10 NOTE — Assessment & Plan Note (Signed)

## 2014-10-10 NOTE — Assessment & Plan Note (Addendum)
Followed every 6 months by Dr. Julien Nordmann at [facility]

## 2014-10-11 ENCOUNTER — Other Ambulatory Visit (INDEPENDENT_AMBULATORY_CARE_PROVIDER_SITE_OTHER): Payer: Medicare Other

## 2014-10-11 ENCOUNTER — Ambulatory Visit: Payer: Medicare Other

## 2014-10-11 DIAGNOSIS — I10 Essential (primary) hypertension: Secondary | ICD-10-CM | POA: Diagnosis not present

## 2014-10-11 DIAGNOSIS — Z0000 Encounter for general adult medical examination without abnormal findings: Secondary | ICD-10-CM | POA: Diagnosis not present

## 2014-10-11 DIAGNOSIS — C9110 Chronic lymphocytic leukemia of B-cell type not having achieved remission: Secondary | ICD-10-CM | POA: Diagnosis not present

## 2014-10-11 DIAGNOSIS — N320 Bladder-neck obstruction: Secondary | ICD-10-CM

## 2014-10-11 DIAGNOSIS — E785 Hyperlipidemia, unspecified: Secondary | ICD-10-CM | POA: Diagnosis not present

## 2014-10-11 LAB — URINALYSIS
BILIRUBIN URINE: NEGATIVE
Hgb urine dipstick: NEGATIVE
KETONES UR: NEGATIVE
Leukocytes, UA: NEGATIVE
Nitrite: NEGATIVE
Specific Gravity, Urine: 1.01 (ref 1.000–1.030)
Total Protein, Urine: NEGATIVE
UROBILINOGEN UA: 0.2 (ref 0.0–1.0)
Urine Glucose: NEGATIVE
pH: 8 (ref 5.0–8.0)

## 2014-10-11 LAB — CBC WITH DIFFERENTIAL/PLATELET
Basophils Absolute: 0 10*3/uL (ref 0.0–0.1)
Basophils Relative: 0.2 % (ref 0.0–3.0)
EOS PCT: 0.4 % (ref 0.0–5.0)
Eosinophils Absolute: 0.1 10*3/uL (ref 0.0–0.7)
HEMATOCRIT: 45 % (ref 39.0–52.0)
Hemoglobin: 14.7 g/dL (ref 13.0–17.0)
Lymphocytes Relative: 85 % — ABNORMAL HIGH (ref 12.0–46.0)
Lymphs Abs: 24.2 10*3/uL — ABNORMAL HIGH (ref 0.7–4.0)
MCHC: 32.8 g/dL (ref 30.0–36.0)
MCV: 96.8 fl (ref 78.0–100.0)
Monocytes Absolute: 0.7 10*3/uL (ref 0.1–1.0)
Monocytes Relative: 2.4 % — ABNORMAL LOW (ref 3.0–12.0)
NEUTROS PCT: 12 % — AB (ref 43.0–77.0)
Neutro Abs: 3.4 10*3/uL (ref 1.4–7.7)
PLATELETS: 148 10*3/uL — AB (ref 150.0–400.0)
RBC: 4.65 Mil/uL (ref 4.22–5.81)
RDW: 13.7 % (ref 11.5–15.5)

## 2014-10-11 LAB — LIPID PANEL
CHOL/HDL RATIO: 3
Cholesterol: 130 mg/dL (ref 0–200)
HDL: 50.5 mg/dL (ref >39.00)
LDL CALC: 70 mg/dL (ref 0–99)
NonHDL: 79.5
Triglycerides: 46 mg/dL (ref 0.0–149.0)
VLDL: 9.2 mg/dL (ref 0.0–40.0)

## 2014-10-11 LAB — HEPATIC FUNCTION PANEL
ALT: 22 U/L (ref 0–53)
AST: 24 U/L (ref 0–37)
Albumin: 3.6 g/dL (ref 3.5–5.2)
Alkaline Phosphatase: 75 U/L (ref 39–117)
BILIRUBIN DIRECT: 0.3 mg/dL (ref 0.0–0.3)
BILIRUBIN TOTAL: 1.8 mg/dL — AB (ref 0.2–1.2)
Total Protein: 6.2 g/dL (ref 6.0–8.3)

## 2014-10-11 LAB — BASIC METABOLIC PANEL
BUN: 15 mg/dL (ref 6–23)
CHLORIDE: 103 meq/L (ref 96–112)
CO2: 31 mEq/L (ref 19–32)
CREATININE: 1 mg/dL (ref 0.4–1.5)
Calcium: 9.1 mg/dL (ref 8.4–10.5)
GFR: 77.06 mL/min (ref >60.00)
Glucose, Bld: 93 mg/dL (ref 70–99)
Potassium: 4.7 mEq/L (ref 3.5–5.1)
Sodium: 141 mEq/L (ref 135–145)

## 2014-10-11 LAB — PSA: PSA: 7.67 ng/mL — AB (ref 0.10–4.00)

## 2014-10-11 LAB — TSH: TSH: 1.61 u[IU]/mL (ref 0.35–4.50)

## 2014-10-13 ENCOUNTER — Telehealth: Payer: Self-pay | Admitting: *Deleted

## 2014-10-13 NOTE — Telephone Encounter (Signed)
Left msg on triage stating he can't get into mychart wanting lab results. Called pt back no answer LMOM with md response. Also mail copy to pt address...Jerry Gomez

## 2014-12-14 ENCOUNTER — Encounter: Payer: Self-pay | Admitting: Internal Medicine

## 2014-12-14 ENCOUNTER — Telehealth: Payer: Self-pay | Admitting: Internal Medicine

## 2014-12-14 ENCOUNTER — Other Ambulatory Visit (HOSPITAL_BASED_OUTPATIENT_CLINIC_OR_DEPARTMENT_OTHER): Payer: Medicare Other

## 2014-12-14 ENCOUNTER — Ambulatory Visit (HOSPITAL_BASED_OUTPATIENT_CLINIC_OR_DEPARTMENT_OTHER): Payer: Medicare Other | Admitting: Internal Medicine

## 2014-12-14 VITALS — BP 130/56 | HR 48 | Temp 97.8°F | Resp 18 | Ht 67.0 in | Wt 166.2 lb

## 2014-12-14 DIAGNOSIS — C9110 Chronic lymphocytic leukemia of B-cell type not having achieved remission: Secondary | ICD-10-CM

## 2014-12-14 LAB — COMPREHENSIVE METABOLIC PANEL (CC13)
ALBUMIN: 4.1 g/dL (ref 3.5–5.0)
ALK PHOS: 85 U/L (ref 40–150)
ALT: 21 U/L (ref 0–55)
ANION GAP: 9 meq/L (ref 3–11)
AST: 23 U/L (ref 5–34)
BUN: 18 mg/dL (ref 7.0–26.0)
CALCIUM: 9.3 mg/dL (ref 8.4–10.4)
CO2: 31 mEq/L — ABNORMAL HIGH (ref 22–29)
Chloride: 101 mEq/L (ref 98–109)
Creatinine: 0.9 mg/dL (ref 0.7–1.3)
EGFR: 76 mL/min/{1.73_m2} — AB (ref >90)
GLUCOSE: 83 mg/dL (ref 70–140)
Potassium: 4.1 mEq/L (ref 3.5–5.1)
Sodium: 141 mEq/L (ref 136–145)
TOTAL PROTEIN: 6.5 g/dL (ref 6.4–8.3)
Total Bilirubin: 1.31 mg/dL — ABNORMAL HIGH (ref 0.20–1.20)

## 2014-12-14 LAB — CBC WITH DIFFERENTIAL/PLATELET
BASO%: 0.1 % (ref 0.0–2.0)
BASOS ABS: 0.1 10*3/uL (ref 0.0–0.1)
EOS%: 0.3 % (ref 0.0–7.0)
Eosinophils Absolute: 0.1 10*3/uL (ref 0.0–0.5)
HCT: 45.1 % (ref 38.4–49.9)
HGB: 14.4 g/dL (ref 13.0–17.1)
LYMPH%: 88.5 % — AB (ref 14.0–49.0)
MCH: 31.1 pg (ref 27.2–33.4)
MCHC: 31.9 g/dL — ABNORMAL LOW (ref 32.0–36.0)
MCV: 97.4 fL (ref 79.3–98.0)
MONO#: 0.9 10*3/uL (ref 0.1–0.9)
MONO%: 2.1 % (ref 0.0–14.0)
NEUT#: 3.8 10*3/uL (ref 1.5–6.5)
NEUT%: 9 % — ABNORMAL LOW (ref 39.0–75.0)
Platelets: 148 10*3/uL (ref 140–400)
RBC: 4.63 10*6/uL (ref 4.20–5.82)
RDW: 13.6 % (ref 11.0–14.6)
WBC: 42 10*3/uL — ABNORMAL HIGH (ref 4.0–10.3)
lymph#: 37.2 10*3/uL — ABNORMAL HIGH (ref 0.9–3.3)

## 2014-12-14 LAB — TECHNOLOGIST REVIEW

## 2014-12-14 LAB — LACTATE DEHYDROGENASE (CC13): LDH: 203 U/L (ref 125–245)

## 2014-12-14 NOTE — Telephone Encounter (Signed)
Gave avs & cal for July °

## 2014-12-14 NOTE — Progress Notes (Signed)
[place] [phone]   Fax:([phone]) [phone]  OFFICE PROGRESS NOTE  Walker Kehr, MD [place] [address]  DIAGNOSIS: Stage 0 Chronic lymphocytic leukemia   PRIOR THERAPY: None   CURRENT THERAPY: Observation  INTERVAL HISTORY: Jerry Gomez 81 y.o. male returns to the clinic today for six-month followup visit. He has been observation with no significant issues over the last few years. The patient is feeling fine today with no specific complaints. He denied having any significant weight loss or night sweats. He denied having any palpable lymphadenopathy. The patient denied having any significant chest pain, shortness of breath, cough or hemoptysis. He had repeat CBC performed earlier today and he is here for evaluation and discussion of his lab results.  MEDICAL HISTORY: Past Medical History  Diagnosis Date  . COLONIC POLYPS 03/02/2009  . EYE SURGERY, HX OF 09/05/2007  . HEMORRHOIDS, HX OF 09/05/2007  . HYPERLIPIDEMIA 09/05/2007  . HYPERTENSION 07/25/2007  . OSTEOARTHRITIS 09/05/2007  . SLEEP APNEA, OBSTRUCTIVE 09/05/2007    ALLERGIES:  has No Known Allergies.  MEDICATIONS:  Current Outpatient Prescriptions  Medication Sig Dispense Refill  . aspirin 325 MG tablet Take 325 mg by mouth daily.      [name] atorvastatin (LIPITOR) 20 MG tablet Take 1 tablet (20 mg total) by mouth daily. 90 tablet 3  . furosemide (LASIX) 40 MG tablet Take 1 tablet (40 mg total) by mouth daily. 90 tablet 3  . metoprolol succinate (TOPROL-XL) 50 MG 24 hr tablet Take 1 tablet (50 mg total) by mouth daily. 90 tablet 3  . Multiple Vitamin (MULTIVITAMIN) tablet Take 1 tablet by mouth daily.      . sildenafil (VIAGRA) 100 MG tablet Take 100 mg by mouth daily as needed.       No current facility-administered medications for this visit.    SURGICAL HISTORY:  Past Surgical History  Procedure Laterality Date  . Eye surgery  10/22/13    left eye, cataract    REVIEW  OF SYSTEMS:  A comprehensive review of systems was negative.   PHYSICAL EXAMINATION: General appearance: alert, cooperative and no distress Head: Normocephalic, without obvious abnormality, atraumatic Neck: no adenopathy Lymph nodes: Cervical, supraclavicular, and axillary nodes normal. Resp: clear to auscultation bilaterally Cardio: regular rate and rhythm, S1, S2 normal, no murmur, click, rub or gallop GI: soft, non-tender; bowel sounds normal; no masses,  no organomegaly Extremities: extremities normal, atraumatic, no cyanosis or edema  ECOG PERFORMANCE STATUS: 1 - Symptomatic but completely ambulatory  Blood pressure 130/56, pulse 48, temperature 97.8 F (36.6 C), temperature source Oral, resp. rate 18, height 5\' 7"  (1.702 m), weight 166 lb 3.2 oz (75.388 kg).  LABORATORY DATA: Lab Results  Component Value Date   WBC 42.0* 12/14/2014   HGB 14.4 12/14/2014   HCT 45.1 12/14/2014   MCV 97.4 12/14/2014   PLT 148 12/14/2014      Chemistry      Component Value Date/Time   NA 141 12/14/2014 1020   NA 141 10/11/2014 0755   K 4.1 12/14/2014 1020   K 4.7 10/11/2014 0755   CL 103 10/11/2014 0755   CL 104 12/14/2012 1031   CO2 31* 12/14/2014 1020   CO2 31 10/11/2014 0755   BUN 18.0 12/14/2014 1020   BUN 15 10/11/2014 0755   CREATININE 0.9 12/14/2014 1020   CREATININE 1.0 10/11/2014 0755      Component Value Date/Time   CALCIUM 9.3 12/14/2014 1020  CALCIUM 9.1 10/11/2014 0755   ALKPHOS 85 12/14/2014 1020   ALKPHOS 75 10/11/2014 0755   AST 23 12/14/2014 1020   AST 24 10/11/2014 0755   ALT 21 12/14/2014 1020   ALT 22 10/11/2014 0755   BILITOT 1.31* 12/14/2014 1020   BILITOT 1.8* 10/11/2014 0755       RADIOGRAPHIC STUDIES: No results found.  ASSESSMENT AND PLAN: This is a very pleasant 81 years old white male with history of stage 0 chronic lymphocytic leukemia currently on observation. The patient is doing fine with no significant evidence for disease  progression. His total white blood count as well as absolute lymphocyte count showed no significant change compared to 6 months ago.  I discussed the lab result with the patient. He is currently asymptomatic. I recommended for him to continue on observation with repeat CBC, comprehensive metabolic panel and LDH in 6 months.  All questions were answered. The patient knows to call the clinic with any problems, questions or concerns. We can certainly see the patient much sooner if necessary.  Disclaimer: This note was dictated with voice recognition software. Similar sounding words can inadvertently be transcribed and may not be corrected upon review.

## 2015-01-09 ENCOUNTER — Other Ambulatory Visit: Payer: Self-pay | Admitting: *Deleted

## 2015-01-09 MED ORDER — FUROSEMIDE 40 MG PO TABS: 40.0000 mg | ORAL_TABLET | Freq: Every day | ORAL | Status: DC

## 2015-01-09 MED ORDER — METOPROLOL SUCCINATE ER 50 MG PO TB24: 50.0000 mg | ORAL_TABLET | Freq: Every day | ORAL | Status: DC

## 2015-01-09 MED ORDER — ATORVASTATIN CALCIUM 20 MG PO TABS: 20.0000 mg | ORAL_TABLET | Freq: Every day | ORAL | Status: DC

## 2015-01-26 ENCOUNTER — Ambulatory Visit (INDEPENDENT_AMBULATORY_CARE_PROVIDER_SITE_OTHER): Payer: Medicare Other | Admitting: Internal Medicine

## 2015-01-26 ENCOUNTER — Encounter: Payer: Self-pay | Admitting: Internal Medicine

## 2015-01-26 ENCOUNTER — Other Ambulatory Visit: Payer: Self-pay | Admitting: Internal Medicine

## 2015-01-26 VITALS — BP 132/70 | HR 55 | Temp 97.7°F | Resp 14 | Ht 66.0 in | Wt 165.0 lb

## 2015-01-26 DIAGNOSIS — I10 Essential (primary) hypertension: Secondary | ICD-10-CM

## 2015-01-26 NOTE — Patient Instructions (Signed)

## 2015-01-26 NOTE — Assessment & Plan Note (Signed)
Blood pressure appears to be at goal; his cuff may be malfunctioning. He does have a new cuff which he will start using to monitor blood pressure.  The bradycardia is most likely related to the beta blocker as well as his conditioning. He is asymptomatic and no changes were made at this time.  Were he symptomatic ; change in BP meds could be considered as a nonrate limiting calcium channel blocker; ACE inhibitor; or ARB.

## 2015-01-26 NOTE — Progress Notes (Signed)
   Subjective:    Patient ID: Jerry Gomez, male    DOB: January 19, 1931, 81 y.o.   MRN: [id number]  HPI  He has been monitoring his blood pressure at home with an arm cuff; over the last 3 days BP has been increasing from the 140s-150s over 60s up to the 160s over 60s. Prior to that the usual range was 110-140 over the 60s..  He has no associated cardiopulmonary symptoms.  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 Weather permitting he plays 2-3 times per week and walks the course  He is on a modified heart healthy diet eating red meat possibly 1 time a week. He does attempt to watch his sodium; but he does eat out at restaurants.  He does not smoke. He will have 2 glasses of wine or one beer nightly.  Labs performed November 2015 were reviewed. TSH was 1.61; HDL 50.5; and LDL 70. His GFR was mildly reduced at 76 with normals greater than 90.  He has CLL and is followed every 6 months by his oncologist.  Review of Systems   Chest pain, palpitations, tachycardia, exertional dyspnea, paroxysmal nocturnal dyspnea, claudication or edema are absent.  He denies any constitutional symptoms of fever, chills, sweats, weight loss.      Objective:   Physical Exam  Pertinent or positive findings include: Pattern alopecia is present. He has remnants of pterygia bilaterally, greater on the right than the left eye. Heart rate is slow but regular; second heart sound is increased. There is an epidermoid inclusion cyst over the right upper thorax posteriorly. There is no associated inflammation.  General appearance :adequately nourished; in no distress. Eyes: No conjunctival inflammation or scleral icterus is present. Oral exam: Dental hygiene is good. Lips and gums are healthy appearing.There is no oropharyngeal erythema or exudate noted.  Heart:   S1  normal without gallop, murmur, click, rub or other extra sounds   Lungs:Chest clear to auscultation; no wheezes, rhonchi,rales  ,or rubs present.No increased work of breathing.  Abdomen: bowel sounds normal, soft and non-tender without masses, organomegaly or hernias noted.  No guarding or rebound. Vascular : all pulses equal ; no bruits present. Skin:Warm & dry.  Intact without suspicious lesions or rashes ; no jaundice or tenting Lymphatic: No lymphadenopathy is noted about the head, neck, axilla Neuro: Strength, tone & DTRs normal.         Assessment & Plan:  See Current Assessment & Plan in Problem List under specific Diagnosis

## 2015-01-26 NOTE — Progress Notes (Signed)
Pre visit review using our clinic review tool, if applicable. No additional management support is needed unless otherwise documented below in the visit note. 

## 2015-04-10 ENCOUNTER — Other Ambulatory Visit (INDEPENDENT_AMBULATORY_CARE_PROVIDER_SITE_OTHER): Payer: Medicare Other

## 2015-04-10 ENCOUNTER — Encounter: Payer: Self-pay | Admitting: Internal Medicine

## 2015-04-10 ENCOUNTER — Ambulatory Visit (INDEPENDENT_AMBULATORY_CARE_PROVIDER_SITE_OTHER): Payer: Medicare Other | Admitting: Internal Medicine

## 2015-04-10 VITALS — BP 135/84 | HR 52 | Wt 164.0 lb

## 2015-04-10 DIAGNOSIS — C9110 Chronic lymphocytic leukemia of B-cell type not having achieved remission: Secondary | ICD-10-CM

## 2015-04-10 DIAGNOSIS — R972 Elevated prostate specific antigen [PSA]: Secondary | ICD-10-CM

## 2015-04-10 DIAGNOSIS — E785 Hyperlipidemia, unspecified: Secondary | ICD-10-CM

## 2015-04-10 DIAGNOSIS — I10 Essential (primary) hypertension: Secondary | ICD-10-CM

## 2015-04-10 LAB — CBC WITH DIFFERENTIAL/PLATELET
Basophils Absolute: 0 10*3/uL (ref 0.0–0.1)
Basophils Relative: 0.1 % (ref 0.0–3.0)
EOS ABS: 0.1 10*3/uL (ref 0.0–0.7)
Eosinophils Relative: 0.3 % (ref 0.0–5.0)
HEMATOCRIT: 43.7 % (ref 39.0–52.0)
HEMOGLOBIN: 14.8 g/dL (ref 13.0–17.0)
Lymphocytes Relative: 87.6 % — ABNORMAL HIGH (ref 12.0–46.0)
Lymphs Abs: 31.5 10*3/uL — ABNORMAL HIGH (ref 0.7–4.0)
MCHC: 33.9 g/dL (ref 30.0–36.0)
MCV: 94.6 fl (ref 78.0–100.0)
Monocytes Absolute: 0.7 10*3/uL (ref 0.1–1.0)
Monocytes Relative: 1.9 % — ABNORMAL LOW (ref 3.0–12.0)
NEUTROS ABS: 3.6 10*3/uL (ref 1.4–7.7)
Neutrophils Relative %: 10.1 % — ABNORMAL LOW (ref 43.0–77.0)
Platelets: 148 10*3/uL — ABNORMAL LOW (ref 150.0–400.0)
RBC: 4.62 Mil/uL (ref 4.22–5.81)
RDW: 14 % (ref 11.5–15.5)

## 2015-04-10 LAB — BASIC METABOLIC PANEL
BUN: 15 mg/dL (ref 6–23)
CHLORIDE: 103 meq/L (ref 96–112)
CO2: 32 mEq/L (ref 19–32)
Calcium: 9.3 mg/dL (ref 8.4–10.5)
Creatinine, Ser: 0.89 mg/dL (ref 0.40–1.50)
GFR: 87.03 mL/min (ref >60.00)
GLUCOSE: 92 mg/dL (ref 70–99)
POTASSIUM: 4.7 meq/L (ref 3.5–5.1)
Sodium: 139 mEq/L (ref 135–145)

## 2015-04-10 LAB — LIPID PANEL
Cholesterol: 137 mg/dL (ref 0–200)
HDL: 49.5 mg/dL (ref >39.00)
LDL Cholesterol: 70 mg/dL (ref 0–99)
NonHDL: 87.5
Total CHOL/HDL Ratio: 3
Triglycerides: 89 mg/dL (ref 0.0–149.0)
VLDL: 17.8 mg/dL (ref 0.0–40.0)

## 2015-04-10 LAB — HEPATIC FUNCTION PANEL
ALK PHOS: 79 U/L (ref 39–117)
ALT: 21 U/L (ref 0–53)
AST: 21 U/L (ref 0–37)
Albumin: 4.2 g/dL (ref 3.5–5.2)
BILIRUBIN TOTAL: 1.7 mg/dL — AB (ref 0.2–1.2)
Bilirubin, Direct: 0.3 mg/dL (ref 0.0–0.3)
Total Protein: 6.7 g/dL (ref 6.0–8.3)

## 2015-04-10 LAB — URIC ACID: URIC ACID, SERUM: 6.2 mg/dL (ref 4.0–7.8)

## 2015-04-10 NOTE — Assessment & Plan Note (Signed)
Metoprolol, furosemide 

## 2015-04-10 NOTE — Progress Notes (Signed)
Pre visit review using our clinic review tool, if applicable. No additional management support is needed unless otherwise documented below in the visit note. 

## 2015-04-10 NOTE — Progress Notes (Signed)
Subjective:    HPI   The patient needs to address  chronic hypertension that has been well controlled with medicines; to address chronic  hyperlipidemia controlled with medicines as well; and to address ED, elevated PSA. 130/80  He has been doing well. Dr. Julien Nordmann - good report re: grade 0 CLL and his lab work was fine.   BP Readings from Last 3 Encounters:  04/10/15 150/80  01/26/15 132/70  12/14/14 130/56   Wt Readings from Last 3 Encounters:  04/10/15 164 lb (74.39 kg)  01/26/15 165 lb (74.844 kg)  12/14/14 166 lb 3.2 oz (75.388 kg)      Review of Systems  Constitutional: Negative for appetite change, fatigue and unexpected weight change.  HENT: Negative for congestion, nosebleeds, sneezing, sore throat and trouble swallowing.   Eyes: Negative for itching and visual disturbance.  Respiratory: Negative for cough.   Cardiovascular: Negative for chest pain, palpitations and leg swelling.  Gastrointestinal: Negative for nausea, diarrhea, blood in stool and abdominal distention.  Genitourinary: Negative for frequency and hematuria.  Musculoskeletal: Negative for back pain, joint swelling, gait problem and neck pain.  Skin: Negative for rash and wound.  Neurological: Negative for dizziness, tremors, speech difficulty and weakness.  Psychiatric/Behavioral: Negative for suicidal ideas, sleep disturbance, dysphoric mood and agitation. The patient is not nervous/anxious.        Objective:   Physical Exam  Constitutional: He is oriented to person, place, and time. He appears well-developed and well-nourished. No distress.  HENT:  Head: Normocephalic and atraumatic.  Right Ear: External ear normal.  Left Ear: External ear normal.  Nose: Nose normal.  Mouth/Throat: Oropharynx is clear and moist. No oropharyngeal exudate.  Eyes: Conjunctivae and EOM are normal. Pupils are equal, round, and reactive to light. Right eye exhibits no discharge. Left eye exhibits no discharge.  No scleral icterus.  Neck: Normal range of motion. Neck supple. No JVD present. No tracheal deviation present. No thyromegaly present.  Cardiovascular: Normal rate, regular rhythm, normal heart sounds and intact distal pulses.  Exam reveals no gallop and no friction rub.   No murmur heard. Pulmonary/Chest: Effort normal and breath sounds normal. No stridor. No respiratory distress. He has no wheezes. He has no rales. He exhibits no tenderness.  Abdominal: Soft. Bowel sounds are normal. He exhibits no distension and no mass. There is no tenderness. There is no rebound and no guarding.  Genitourinary: Rectum normal, prostate normal and penis normal. Guaiac negative stool. No penile tenderness.  Musculoskeletal: Normal range of motion. He exhibits no edema or tenderness.  Lymphadenopathy:    He has no cervical adenopathy.  Neurological: He is alert and oriented to person, place, and time. He has normal reflexes. No cranial nerve deficit. He exhibits normal muscle tone. Coordination normal.  Skin: Skin is warm and dry. No rash noted. He is not diaphoretic. No erythema. No pallor.  Psychiatric: He has a normal mood and affect. His behavior is normal. Judgment and thought content normal.    Lab Results  Component Value Date   WBC 42.0* 12/14/2014   HGB 14.4 12/14/2014   HCT 45.1 12/14/2014   PLT 148 12/14/2014   GLUCOSE 83 12/14/2014   CHOL 130 10/11/2014   TRIG 46.0 10/11/2014   HDL 50.50 10/11/2014   LDLCALC 70 10/11/2014   ALT 21 12/14/2014   AST 23 12/14/2014   NA 141 12/14/2014   K 4.1 12/14/2014   CL 103 10/11/2014   CREATININE 0.9 12/14/2014  BUN 18.0 12/14/2014   CO2 31* 12/14/2014   TSH 1.61 10/11/2014   PSA 7.67* 10/11/2014   EKG S brady     Assessment & Plan:

## 2015-04-10 NOTE — Assessment & Plan Note (Signed)
Dr Earlie Server f/u

## 2015-04-10 NOTE — Assessment & Plan Note (Signed)
Free PSA 

## 2015-04-11 LAB — PSA, TOTAL AND FREE
PSA, Free Pct: 20 % — ABNORMAL LOW (ref >25)
PSA, Free: 1.73 ng/mL
PSA: 8.53 ng/mL — AB (ref <=4.00)

## 2015-04-19 DIAGNOSIS — L821 Other seborrheic keratosis: Secondary | ICD-10-CM | POA: Diagnosis not present

## 2015-04-19 DIAGNOSIS — L570 Actinic keratosis: Secondary | ICD-10-CM | POA: Diagnosis not present

## 2015-04-19 DIAGNOSIS — D692 Other nonthrombocytopenic purpura: Secondary | ICD-10-CM | POA: Diagnosis not present

## 2015-04-19 DIAGNOSIS — D1801 Hemangioma of skin and subcutaneous tissue: Secondary | ICD-10-CM | POA: Diagnosis not present

## 2015-04-19 DIAGNOSIS — Z85828 Personal history of other malignant neoplasm of skin: Secondary | ICD-10-CM | POA: Diagnosis not present

## 2015-04-19 DIAGNOSIS — L918 Other hypertrophic disorders of the skin: Secondary | ICD-10-CM | POA: Diagnosis not present

## 2015-04-19 DIAGNOSIS — L720 Epidermal cyst: Secondary | ICD-10-CM | POA: Diagnosis not present

## 2015-05-23 ENCOUNTER — Telehealth: Payer: Self-pay | Admitting: Internal Medicine

## 2015-05-23 NOTE — Telephone Encounter (Signed)
AM PAL - moved 7/6 appointments to 3pm. Spoke with patient and mailed schedule.

## 2015-06-14 ENCOUNTER — Other Ambulatory Visit (HOSPITAL_BASED_OUTPATIENT_CLINIC_OR_DEPARTMENT_OTHER): Payer: Medicare Other

## 2015-06-14 ENCOUNTER — Encounter: Payer: Self-pay | Admitting: Internal Medicine

## 2015-06-14 ENCOUNTER — Telehealth: Payer: Self-pay | Admitting: Internal Medicine

## 2015-06-14 ENCOUNTER — Ambulatory Visit (HOSPITAL_BASED_OUTPATIENT_CLINIC_OR_DEPARTMENT_OTHER): Payer: Medicare Other | Admitting: Internal Medicine

## 2015-06-14 VITALS — BP 142/59 | HR 59 | Temp 97.7°F | Resp 18 | Ht 66.0 in | Wt 159.6 lb

## 2015-06-14 DIAGNOSIS — C9110 Chronic lymphocytic leukemia of B-cell type not having achieved remission: Secondary | ICD-10-CM

## 2015-06-14 LAB — COMPREHENSIVE METABOLIC PANEL (CC13)
ALK PHOS: 81 U/L (ref 40–150)
ALT: 25 U/L (ref 0–55)
AST: 28 U/L (ref 5–34)
Albumin: 4.1 g/dL (ref 3.5–5.0)
Anion Gap: 10 mEq/L (ref 3–11)
BUN: 13.7 mg/dL (ref 7.0–26.0)
CALCIUM: 9.1 mg/dL (ref 8.4–10.4)
CO2: 28 mEq/L (ref 22–29)
CREATININE: 1 mg/dL (ref 0.7–1.3)
Chloride: 101 mEq/L (ref 98–109)
EGFR: 72 mL/min/{1.73_m2} — ABNORMAL LOW (ref >90)
Glucose: 82 mg/dl (ref 70–140)
Potassium: 4.3 mEq/L (ref 3.5–5.1)
Sodium: 140 mEq/L (ref 136–145)
Total Bilirubin: 1.64 mg/dL — ABNORMAL HIGH (ref 0.20–1.20)
Total Protein: 6.3 g/dL — ABNORMAL LOW (ref 6.4–8.3)

## 2015-06-14 LAB — CBC WITH DIFFERENTIAL/PLATELET
BASO%: 0.2 % (ref 0.0–2.0)
Basophils Absolute: 0.1 10*3/uL (ref 0.0–0.1)
EOS%: 0.5 % (ref 0.0–7.0)
Eosinophils Absolute: 0.2 10*3/uL (ref 0.0–0.5)
HCT: 42.5 % (ref 38.4–49.9)
HGB: 14 g/dL (ref 13.0–17.1)
LYMPH#: 28.8 10*3/uL — AB (ref 0.9–3.3)
LYMPH%: 89.4 % — ABNORMAL HIGH (ref 14.0–49.0)
MCH: 32.1 pg (ref 27.2–33.4)
MCHC: 33 g/dL (ref 32.0–36.0)
MCV: 97.5 fL (ref 79.3–98.0)
MONO#: 0.5 10*3/uL (ref 0.1–0.9)
MONO%: 1.5 % (ref 0.0–14.0)
NEUT%: 8.4 % — ABNORMAL LOW (ref 39.0–75.0)
NEUTROS ABS: 2.7 10*3/uL (ref 1.5–6.5)
Platelets: 155 10*3/uL (ref 140–400)
RBC: 4.36 10*6/uL (ref 4.20–5.82)
RDW: 13.5 % (ref 11.0–14.6)
WBC: 32.3 10*3/uL — ABNORMAL HIGH (ref 4.0–10.3)

## 2015-06-14 LAB — LACTATE DEHYDROGENASE (CC13): LDH: 201 U/L (ref 125–245)

## 2015-06-14 LAB — TECHNOLOGIST REVIEW

## 2015-06-14 NOTE — Telephone Encounter (Signed)
per pof to sch pt appt-gave pt copy of avs °

## 2015-06-14 NOTE — Progress Notes (Signed)
[place] Telephone:([phone]   Fax:(336) [phone]  OFFICE PROGRESS NOTE  Walker Kehr, MD [place] [address]  DIAGNOSIS: Stage 0 Chronic lymphocytic leukemia   PRIOR THERAPY: None   CURRENT THERAPY: Observation  INTERVAL HISTORY: Jerry Gomez 81 y.o. male returns to the clinic today for six-month followup visit. The patient is feeling fine today with no specific complaints. He denied having any significant weight loss or night sweats. He denied having any palpable lymphadenopathy. The patient denied having any significant chest pain, shortness of breath, cough or hemoptysis. He had repeat CBC performed earlier today and he is here for evaluation and discussion of his lab results.  MEDICAL HISTORY: Past Medical History  Diagnosis Date  . COLONIC POLYPS 03/02/2009  . EYE SURGERY, HX OF 09/05/2007  . HEMORRHOIDS, HX OF 09/05/2007  . HYPERLIPIDEMIA 09/05/2007  . HYPERTENSION 07/25/2007  . OSTEOARTHRITIS 09/05/2007  . SLEEP APNEA, OBSTRUCTIVE 09/05/2007    ALLERGIES:  has No Known Allergies.  MEDICATIONS:  Current Outpatient Prescriptions  Medication Sig Dispense Refill  . aspirin 325 MG tablet Take 325 mg by mouth daily.      [name] atorvastatin (LIPITOR) 20 MG tablet Take 1 tablet (20 mg total) by mouth daily. 90 tablet 3  . furosemide (LASIX) 40 MG tablet Take 1 tablet (40 mg total) by mouth daily. 90 tablet 3  . metoprolol succinate (TOPROL-XL) 50 MG 24 hr tablet Take 1 tablet (50 mg total) by mouth daily. 90 tablet 3  . Multiple Vitamin (MULTIVITAMIN) tablet Take 1 tablet by mouth daily.      . sildenafil (VIAGRA) 100 MG tablet Take 100 mg by mouth daily as needed.       No current facility-administered medications for this visit.    SURGICAL HISTORY:  Past Surgical History  Procedure Laterality Date  . Eye surgery  10/22/13    left eye, cataract    REVIEW OF SYSTEMS:  A comprehensive review of systems was negative.   PHYSICAL  EXAMINATION: General appearance: alert, cooperative and no distress Head: Normocephalic, without obvious abnormality, atraumatic Neck: no adenopathy Lymph nodes: Cervical, supraclavicular, and axillary nodes normal. Resp: clear to auscultation bilaterally Cardio: regular rate and rhythm, S1, S2 normal, no murmur, click, rub or gallop GI: soft, non-tender; bowel sounds normal; no masses,  no organomegaly Extremities: extremities normal, atraumatic, no cyanosis or edema  ECOG PERFORMANCE STATUS: 1 - Symptomatic but completely ambulatory  Blood pressure 142/59, pulse 59, temperature 97.7 F (36.5 C), temperature source Oral, resp. rate 18, height 5\' 6"  (1.676 m), weight 159 lb 9.6 oz (72.394 kg), SpO2 100 %.  LABORATORY DATA: Lab Results  Component Value Date   WBC 32.3* 06/14/2015   HGB 14.0 06/14/2015   HCT 42.5 06/14/2015   MCV 97.5 06/14/2015   PLT 155 06/14/2015      Chemistry      Component Value Date/Time   NA 140 06/14/2015 1459   NA 139 04/10/2015 1115   K 4.3 06/14/2015 1459   K 4.7 04/10/2015 1115   CL 103 04/10/2015 1115   CL 104 12/14/2012 1031   CO2 28 06/14/2015 1459   CO2 32 04/10/2015 1115   BUN 13.7 06/14/2015 1459   BUN 15 04/10/2015 1115   CREATININE 1.0 06/14/2015 1459   CREATININE 0.89 04/10/2015 1115      Component Value Date/Time   CALCIUM 9.1 06/14/2015 1459   CALCIUM 9.3 04/10/2015 1115   ALKPHOS 81  06/14/2015 1459   ALKPHOS 79 04/10/2015 1115   AST 28 06/14/2015 1459   AST 21 04/10/2015 1115   ALT 25 06/14/2015 1459   ALT 21 04/10/2015 1115   BILITOT 1.64* 06/14/2015 1459   BILITOT 1.7* 04/10/2015 1115       RADIOGRAPHIC STUDIES: No results found.  ASSESSMENT AND PLAN: This is a very pleasant 81 years old white male with history of stage 0 chronic lymphocytic leukemia currently on observation. The patient is doing fine with no significant complaints. His total white blood count as well as absolute lymphocyte count are lower compared  compared to 6 months ago.  I discussed the lab result with the patient.  I recommended for him to continue on observation with repeat CBC, comprehensive metabolic panel and LDH in 6 months.  All questions were answered. The patient knows to call the clinic with any problems, questions or concerns. We can certainly see the patient much sooner if necessary.  Disclaimer: This note was dictated with voice recognition software. Similar sounding words can inadvertently be transcribed and may not be corrected upon review.

## 2015-06-27 DIAGNOSIS — Z961 Presence of intraocular lens: Secondary | ICD-10-CM | POA: Diagnosis not present

## 2015-06-27 DIAGNOSIS — H35373 Puckering of macula, bilateral: Secondary | ICD-10-CM | POA: Diagnosis not present

## 2015-08-30 DIAGNOSIS — D485 Neoplasm of uncertain behavior of skin: Secondary | ICD-10-CM | POA: Diagnosis not present

## 2015-08-30 DIAGNOSIS — Z85828 Personal history of other malignant neoplasm of skin: Secondary | ICD-10-CM | POA: Diagnosis not present

## 2015-08-30 DIAGNOSIS — L720 Epidermal cyst: Secondary | ICD-10-CM | POA: Diagnosis not present

## 2015-08-30 DIAGNOSIS — C44329 Squamous cell carcinoma of skin of other parts of face: Secondary | ICD-10-CM | POA: Diagnosis not present

## 2015-08-30 DIAGNOSIS — L821 Other seborrheic keratosis: Secondary | ICD-10-CM | POA: Diagnosis not present

## 2015-09-18 DIAGNOSIS — Z85828 Personal history of other malignant neoplasm of skin: Secondary | ICD-10-CM | POA: Diagnosis not present

## 2015-09-18 DIAGNOSIS — C44329 Squamous cell carcinoma of skin of other parts of face: Secondary | ICD-10-CM | POA: Diagnosis not present

## 2015-09-21 ENCOUNTER — Ambulatory Visit: Payer: Medicare Other

## 2015-09-22 ENCOUNTER — Ambulatory Visit (INDEPENDENT_AMBULATORY_CARE_PROVIDER_SITE_OTHER): Payer: Medicare Other

## 2015-09-22 DIAGNOSIS — Z23 Encounter for immunization: Secondary | ICD-10-CM

## 2015-10-10 ENCOUNTER — Encounter: Payer: Self-pay | Admitting: Internal Medicine

## 2015-10-10 ENCOUNTER — Ambulatory Visit (INDEPENDENT_AMBULATORY_CARE_PROVIDER_SITE_OTHER): Payer: Medicare Other | Admitting: Internal Medicine

## 2015-10-10 ENCOUNTER — Other Ambulatory Visit (INDEPENDENT_AMBULATORY_CARE_PROVIDER_SITE_OTHER): Payer: Medicare Other

## 2015-10-10 VITALS — BP 126/76 | HR 54 | Wt 164.0 lb

## 2015-10-10 DIAGNOSIS — E785 Hyperlipidemia, unspecified: Secondary | ICD-10-CM | POA: Diagnosis not present

## 2015-10-10 DIAGNOSIS — C9110 Chronic lymphocytic leukemia of B-cell type not having achieved remission: Secondary | ICD-10-CM | POA: Diagnosis not present

## 2015-10-10 DIAGNOSIS — R972 Elevated prostate specific antigen [PSA]: Secondary | ICD-10-CM

## 2015-10-10 DIAGNOSIS — I10 Essential (primary) hypertension: Secondary | ICD-10-CM

## 2015-10-10 LAB — CBC WITH DIFFERENTIAL/PLATELET
BASOS ABS: 0.1 10*3/uL (ref 0.0–0.1)
Basophils Relative: 0.3 % (ref 0.0–3.0)
EOS ABS: 0.2 10*3/uL (ref 0.0–0.7)
Eosinophils Relative: 0.5 % (ref 0.0–5.0)
HCT: 43.5 % (ref 39.0–52.0)
HEMOGLOBIN: 14.3 g/dL (ref 13.0–17.0)
Lymphocytes Relative: 88 % — ABNORMAL HIGH (ref 12.0–46.0)
Lymphs Abs: 30.4 10*3/uL — ABNORMAL HIGH (ref 0.7–4.0)
MCHC: 32.8 g/dL (ref 30.0–36.0)
MCV: 96.5 fl (ref 78.0–100.0)
MONO ABS: 0.6 10*3/uL (ref 0.1–1.0)
Monocytes Relative: 1.9 % — ABNORMAL LOW (ref 3.0–12.0)
Neutro Abs: 3.2 10*3/uL (ref 1.4–7.7)
Neutrophils Relative %: 9.3 % — ABNORMAL LOW (ref 43.0–77.0)
Platelets: 136 10*3/uL — ABNORMAL LOW (ref 150.0–400.0)
RBC: 4.51 Mil/uL (ref 4.22–5.81)
RDW: 13.8 % (ref 11.5–15.5)

## 2015-10-10 LAB — BASIC METABOLIC PANEL
BUN: 17 mg/dL (ref 6–23)
CHLORIDE: 103 meq/L (ref 96–112)
CO2: 33 meq/L — AB (ref 19–32)
Calcium: 9.2 mg/dL (ref 8.4–10.5)
Creatinine, Ser: 0.98 mg/dL (ref 0.40–1.50)
GFR: 77.78 mL/min (ref >60.00)
GLUCOSE: 94 mg/dL (ref 70–99)
POTASSIUM: 4.6 meq/L (ref 3.5–5.1)
SODIUM: 141 meq/L (ref 135–145)

## 2015-10-10 LAB — HEPATIC FUNCTION PANEL
ALBUMIN: 4.3 g/dL (ref 3.5–5.2)
ALK PHOS: 72 U/L (ref 39–117)
ALT: 21 U/L (ref 0–53)
AST: 23 U/L (ref 0–37)
Bilirubin, Direct: 0.3 mg/dL (ref 0.0–0.3)
TOTAL PROTEIN: 6.4 g/dL (ref 6.0–8.3)
Total Bilirubin: 1.4 mg/dL — ABNORMAL HIGH (ref 0.2–1.2)

## 2015-10-10 LAB — PSA: PSA: 7.66 ng/mL — ABNORMAL HIGH (ref 0.10–4.00)

## 2015-10-10 MED ORDER — VITAMIN D 1000 UNITS PO TABS: 1000.0000 [IU] | ORAL_TABLET | Freq: Every day | ORAL | Status: DC

## 2015-10-10 NOTE — Progress Notes (Signed)
Pre visit review using our clinic review tool, if applicable. No additional management support is needed unless otherwise documented below in the visit note. 

## 2015-10-10 NOTE — Assessment & Plan Note (Signed)
Metoprolol, furosemide 

## 2015-10-10 NOTE — Assessment & Plan Note (Signed)
On Lipitor 

## 2015-10-10 NOTE — Assessment & Plan Note (Addendum)
Followed every 6 months by Dr. Julien Nordmann at [facility]

## 2015-10-10 NOTE — Assessment & Plan Note (Signed)
PSA

## 2015-10-10 NOTE — Progress Notes (Signed)
Subjective:  Patient ID: Jerry Gomez, male    DOB: Sep 11, 1931  Age: 81 y.o. MRN: [id number]  CC: No chief complaint on file.   HPI Jerry Gomez presents for dyslipidemia, CLL, ED, elev PSA f/u  Outpatient Prescriptions Prior to Visit  Medication Sig Dispense Refill  . aspirin 325 MG tablet Take 325 mg by mouth daily.      [name] atorvastatin (LIPITOR) 20 MG tablet Take 1 tablet (20 mg total) by mouth daily. 90 tablet 3  . furosemide (LASIX) 40 MG tablet Take 1 tablet (40 mg total) by mouth daily. 90 tablet 3  . metoprolol succinate (TOPROL-XL) 50 MG 24 hr tablet Take 1 tablet (50 mg total) by mouth daily. 90 tablet 3  . Multiple Vitamin (MULTIVITAMIN) tablet Take 1 tablet by mouth daily.      . sildenafil (VIAGRA) 100 MG tablet Take 100 mg by mouth daily as needed.       No facility-administered medications prior to visit.    ROS Review of Systems  Constitutional: Negative for appetite change, fatigue and unexpected weight change.  HENT: Negative for congestion, nosebleeds, sneezing, sore throat and trouble swallowing.   Eyes: Negative for itching and visual disturbance.  Respiratory: Negative for cough.   Cardiovascular: Negative for chest pain, palpitations and leg swelling.  Gastrointestinal: Negative for nausea, diarrhea, blood in stool and abdominal distention.  Genitourinary: Negative for frequency and hematuria.  Musculoskeletal: Negative for back pain, joint swelling, gait problem and neck pain.  Skin: Negative for rash.  Neurological: Negative for dizziness, tremors, speech difficulty and weakness.  Psychiatric/Behavioral: Negative for sleep disturbance, dysphoric mood and agitation. The patient is not nervous/anxious.     Objective:  BP 126/76 mmHg  Pulse 54  Wt 164 lb (74.39 kg)  SpO2 98%  BP Readings from Last 3 Encounters:  10/10/15 126/76  06/14/15 142/59  04/10/15 135/84    Wt Readings from Last 3 Encounters:  10/10/15 164 lb (74.39 kg)  06/14/15 159 lb  9.6 oz (72.394 kg)  04/10/15 164 lb (74.39 kg)    Physical Exam  Constitutional: He is oriented to person, place, and time. He appears well-developed. No distress.  NAD  HENT:  Mouth/Throat: Oropharynx is clear and moist.  Eyes: Conjunctivae are normal. Pupils are equal, round, and reactive to light.  Neck: Normal range of motion. No JVD present. No thyromegaly present.  Cardiovascular: Normal rate, regular rhythm, normal heart sounds and intact distal pulses.  Exam reveals no gallop and no friction rub.   No murmur heard. Pulmonary/Chest: Effort normal and breath sounds normal. No respiratory distress. He has no wheezes. He has no rales. He exhibits no tenderness.  Abdominal: Soft. Bowel sounds are normal. He exhibits no distension and no mass. There is no tenderness. There is no rebound and no guarding.  Musculoskeletal: Normal range of motion. He exhibits no edema or tenderness.  Lymphadenopathy:    He has no cervical adenopathy.  Neurological: He is alert and oriented to person, place, and time. He has normal reflexes. No cranial nerve deficit. He exhibits normal muscle tone. He displays a negative Romberg sign. Coordination and gait normal.  Skin: Skin is warm and dry. No rash noted.  Psychiatric: He has a normal mood and affect. His behavior is normal. Judgment and thought content normal.    Lab Results  Component Value Date   WBC 32.3* 06/14/2015   HGB 14.0 06/14/2015   HCT 42.5 06/14/2015   PLT 155 06/14/2015  GLUCOSE 82 06/14/2015   CHOL 137 04/10/2015   TRIG 89.0 04/10/2015   HDL 49.50 04/10/2015   LDLCALC 70 04/10/2015   ALT 25 06/14/2015   AST 28 06/14/2015   NA 140 06/14/2015   K 4.3 06/14/2015   CL 103 04/10/2015   CREATININE 1.0 06/14/2015   BUN 13.7 06/14/2015   CO2 28 06/14/2015   TSH 1.61 10/11/2014   PSA 8.53* 04/10/2015    Dg Lumbar Spine Complete  03/12/2012  *RADIOLOGY REPORT* Clinical Data: Pain distal right lower extremity LUMBAR SPINE - COMPLETE  4+ VIEW Comparison: None. Findings: Five views of the lumbar spine submitted.  No acute fracture or subluxation.  Mild dextroscoliosis.  Mild anterior spurring noted upper endplate of L3 and L4 vertebral body.  Mild disc space flattening at L3-L4 L4-L5 and L5-S1 level.  Mild facet degenerative changes L4 and L5 level. IMPRESSION: No acute fracture or subluxation.  Mild dextroscoliosis.  Mild degenerative changes as described above. Original Report Authenticated By: Lahoma Crocker, M.D.   Assessment & Plan:   There are no diagnoses linked to this encounter. I am having Mr. Venturino maintain his aspirin, multivitamin, sildenafil, atorvastatin, furosemide, and metoprolol succinate.  No orders of the defined types were placed in this encounter.     Follow-up: No Follow-up on file.  Walker Kehr, MD

## 2015-10-12 ENCOUNTER — Ambulatory Visit: Payer: Medicare Other | Admitting: Internal Medicine

## 2015-11-01 ENCOUNTER — Encounter: Payer: Self-pay | Admitting: Internal Medicine

## 2015-11-01 ENCOUNTER — Ambulatory Visit (INDEPENDENT_AMBULATORY_CARE_PROVIDER_SITE_OTHER): Payer: Medicare Other | Admitting: Internal Medicine

## 2015-11-01 VITALS — BP 120/80 | HR 65 | Temp 97.8°F | Wt 164.0 lb

## 2015-11-01 DIAGNOSIS — J019 Acute sinusitis, unspecified: Secondary | ICD-10-CM | POA: Insufficient documentation

## 2015-11-01 DIAGNOSIS — J0100 Acute maxillary sinusitis, unspecified: Secondary | ICD-10-CM | POA: Diagnosis not present

## 2015-11-01 MED ORDER — AMOXICILLIN-POT CLAVULANATE 875-125 MG PO TABS: 1.0000 | ORAL_TABLET | Freq: Two times a day (BID) | ORAL | Status: DC

## 2015-11-01 MED ORDER — PROMETHAZINE-CODEINE 6.25-10 MG/5ML PO SYRP: 5.0000 mL | ORAL_SOLUTION | Freq: Four times a day (QID) | ORAL | Status: DC | PRN

## 2015-11-01 NOTE — Progress Notes (Signed)
Subjective:  Patient ID: Jerry Gomez, male    DOB: [birth year]  Age: 81 y.o. MRN: [id number]  CC: No chief complaint on file.   HPI Jerry Gomez presents for sinusitis sx's x 1 wk. C/o brown mucus  Outpatient Prescriptions Prior to Visit  Medication Sig Dispense Refill  . aspirin 325 MG tablet Take 325 mg by mouth daily.      [name] atorvastatin (LIPITOR) 20 MG tablet Take 1 tablet (20 mg total) by mouth daily. 90 tablet 3  . cholecalciferol (VITAMIN D) 1000 UNITS tablet Take 1 tablet (1,000 Units total) by mouth daily. 100 tablet 3  . furosemide (LASIX) 40 MG tablet Take 1 tablet (40 mg total) by mouth daily. 90 tablet 3  . metoprolol succinate (TOPROL-XL) 50 MG 24 hr tablet Take 1 tablet (50 mg total) by mouth daily. 90 tablet 3  . Multiple Vitamin (MULTIVITAMIN) tablet Take 1 tablet by mouth daily.      . sildenafil (VIAGRA) 100 MG tablet Take 100 mg by mouth daily as needed.       No facility-administered medications prior to visit.    ROS Review of Systems  Constitutional: Negative for appetite change, fatigue and unexpected weight change.  HENT: Positive for congestion, sinus pressure and sore throat. Negative for nosebleeds, sneezing and trouble swallowing.   Eyes: Negative for itching and visual disturbance.  Respiratory: Positive for cough.   Cardiovascular: Negative for chest pain, palpitations and leg swelling.  Gastrointestinal: Negative for nausea, diarrhea, blood in stool and abdominal distention.  Genitourinary: Negative for frequency and hematuria.  Musculoskeletal: Negative for back pain, joint swelling, gait problem and neck pain.  Skin: Negative for rash.  Neurological: Negative for dizziness, tremors, speech difficulty and weakness.  Psychiatric/Behavioral: Negative for sleep disturbance, dysphoric mood and agitation. The patient is not nervous/anxious.     Objective:  BP 120/80 mmHg  Pulse 65  Temp(Src) 97.8 F (36.6 C) (Oral)  Wt 164 lb (74.39 kg)  SpO2  96%  BP Readings from Last 3 Encounters:  11/01/15 120/80  10/10/15 126/76  06/14/15 142/59    Wt Readings from Last 3 Encounters:  11/01/15 164 lb (74.39 kg)  10/10/15 164 lb (74.39 kg)  06/14/15 159 lb 9.6 oz (72.394 kg)    Physical Exam  Constitutional: He is oriented to person, place, and time. He appears well-developed. No distress.  NAD  Eyes: Conjunctivae are normal. Pupils are equal, round, and reactive to light.  Neck: Normal range of motion. No JVD present. No thyromegaly present.  Cardiovascular: Normal rate, regular rhythm, normal heart sounds and intact distal pulses.  Exam reveals no gallop and no friction rub.   No murmur heard. Pulmonary/Chest: Effort normal and breath sounds normal. No respiratory distress. He has no wheezes. He has no rales. He exhibits no tenderness.  Abdominal: Soft. Bowel sounds are normal. He exhibits no distension and no mass. There is no tenderness. There is no rebound and no guarding.  Musculoskeletal: Normal range of motion. He exhibits no edema or tenderness.  Lymphadenopathy:    He has no cervical adenopathy.  Neurological: He is alert and oriented to person, place, and time. He has normal reflexes. No cranial nerve deficit. He exhibits normal muscle tone. He displays a negative Romberg sign. Coordination and gait normal.  Skin: Skin is warm and dry. No rash noted.  Psychiatric: He has a normal mood and affect. His behavior is normal. Judgment and thought content normal.  eryth nares w/brown mucus  Lab Results  Component Value Date   WBC 34.5 Repeated and verified X2.* 10/10/2015   HGB 14.3 10/10/2015   HCT 43.5 10/10/2015   PLT 136.0* 10/10/2015   GLUCOSE 94 10/10/2015   CHOL 137 04/10/2015   TRIG 89.0 04/10/2015   HDL 49.50 04/10/2015   LDLCALC 70 04/10/2015   ALT 21 10/10/2015   AST 23 10/10/2015   NA 141 10/10/2015   K 4.6 10/10/2015   CL 103 10/10/2015   CREATININE 0.98 10/10/2015   BUN 17 10/10/2015   CO2 33*  10/10/2015   TSH 1.61 10/11/2014   PSA 7.66* 10/10/2015    Dg Lumbar Spine Complete  03/12/2012  *RADIOLOGY REPORT* Clinical Data: Pain distal right lower extremity LUMBAR SPINE - COMPLETE 4+ VIEW Comparison: None. Findings: Five views of the lumbar spine submitted.  No acute fracture or subluxation.  Mild dextroscoliosis.  Mild anterior spurring noted upper endplate of L3 and L4 vertebral body.  Mild disc space flattening at L3-L4 L4-L5 and L5-S1 level.  Mild facet degenerative changes L4 and L5 level. IMPRESSION: No acute fracture or subluxation.  Mild dextroscoliosis.  Mild degenerative changes as described above. Original Report Authenticated By: Lahoma Crocker, M.D.   Assessment & Plan:   There are no diagnoses linked to this encounter. I am having Mr. Caven maintain his aspirin, multivitamin, sildenafil, atorvastatin, furosemide, metoprolol succinate, and cholecalciferol.  No orders of the defined types were placed in this encounter.     Follow-up: No Follow-up on file.  Walker Kehr, MD

## 2015-11-01 NOTE — Assessment & Plan Note (Signed)
Augmentin Probiotic

## 2015-11-01 NOTE — Progress Notes (Signed)
Pre visit review using our clinic review tool, if applicable. No additional management support is needed unless otherwise documented below in the visit note. 

## 2015-12-18 ENCOUNTER — Encounter: Payer: Self-pay | Admitting: Internal Medicine

## 2015-12-18 ENCOUNTER — Ambulatory Visit (HOSPITAL_BASED_OUTPATIENT_CLINIC_OR_DEPARTMENT_OTHER): Payer: Medicare Other | Admitting: Internal Medicine

## 2015-12-18 ENCOUNTER — Other Ambulatory Visit (HOSPITAL_BASED_OUTPATIENT_CLINIC_OR_DEPARTMENT_OTHER): Payer: Medicare Other

## 2015-12-18 ENCOUNTER — Telehealth: Payer: Self-pay | Admitting: Internal Medicine

## 2015-12-18 VITALS — BP 129/58 | HR 50 | Temp 97.5°F | Resp 17 | Ht 66.0 in | Wt 162.0 lb

## 2015-12-18 DIAGNOSIS — C9110 Chronic lymphocytic leukemia of B-cell type not having achieved remission: Secondary | ICD-10-CM

## 2015-12-18 LAB — COMPREHENSIVE METABOLIC PANEL
ALT: 19 U/L (ref 0–55)
AST: 24 U/L (ref 5–34)
Albumin: 4.3 g/dL (ref 3.5–5.0)
Alkaline Phosphatase: 81 U/L (ref 40–150)
Anion Gap: 8 mEq/L (ref 3–11)
BUN: 13.8 mg/dL (ref 7.0–26.0)
CO2: 31 mEq/L — ABNORMAL HIGH (ref 22–29)
Calcium: 9.2 mg/dL (ref 8.4–10.4)
Chloride: 101 mEq/L (ref 98–109)
Creatinine: 1.3 mg/dL (ref 0.7–1.3)
EGFR: 52 mL/min/{1.73_m2} — ABNORMAL LOW (ref >90)
Glucose: 90 mg/dl (ref 70–140)
Potassium: 4.1 mEq/L (ref 3.5–5.1)
Sodium: 141 mEq/L (ref 136–145)
Total Bilirubin: 1.5 mg/dL — ABNORMAL HIGH (ref 0.20–1.20)
Total Protein: 6.7 g/dL (ref 6.4–8.3)

## 2015-12-18 LAB — LACTATE DEHYDROGENASE: LDH: 218 U/L (ref 125–245)

## 2015-12-18 LAB — CBC WITH DIFFERENTIAL/PLATELET
BASO%: 0.1 % (ref 0.0–2.0)
BASOS ABS: 0 10*3/uL (ref 0.0–0.1)
EOS ABS: 0.2 10*3/uL (ref 0.0–0.5)
EOS%: 0.5 % (ref 0.0–7.0)
HCT: 44.7 % (ref 38.4–49.9)
HGB: 14.4 g/dL (ref 13.0–17.1)
LYMPH%: 88.5 % — AB (ref 14.0–49.0)
MCH: 30.9 pg (ref 27.2–33.4)
MCHC: 32.1 g/dL (ref 32.0–36.0)
MCV: 96 fL (ref 79.3–98.0)
MONO#: 0.5 10*3/uL (ref 0.1–0.9)
MONO%: 1.5 % (ref 0.0–14.0)
NEUT%: 9.4 % — AB (ref 39.0–75.0)
NEUTROS ABS: 3 10*3/uL (ref 1.5–6.5)
PLATELETS: 155 10*3/uL (ref 140–400)
RBC: 4.65 10*6/uL (ref 4.20–5.82)
RDW: 13.6 % (ref 11.0–14.6)
WBC: 32.2 10*3/uL — AB (ref 4.0–10.3)
lymph#: 28.5 10*3/uL — ABNORMAL HIGH (ref 0.9–3.3)

## 2015-12-18 LAB — TECHNOLOGIST REVIEW

## 2015-12-18 NOTE — Progress Notes (Signed)
[facility] [phone]   [phone]  OFFICE PROGRESS NOTE  Walker Kehr, MD [place] [address]  DIAGNOSIS: Stage 0 Chronic lymphocytic leukemia   PRIOR THERAPY: None   CURRENT THERAPY: Observation  INTERVAL HISTORY: Jerry Gomez 82 y.o. male returns to the clinic today for six-month followup visit. The patient is feeling fine today with no specific complaints. He enjoyed his Christmas holiday with his family. He denied having any significant weight loss or night sweats. He denied having any palpable lymphadenopathy. The patient denied having any significant chest pain, shortness of breath, cough or hemoptysis. He had repeat CBC performed earlier today and he is here for evaluation and discussion of his lab results.  MEDICAL HISTORY: Past Medical History  Diagnosis Date  . COLONIC POLYPS 03/02/2009  . EYE SURGERY, HX OF 09/05/2007  . HEMORRHOIDS, HX OF 09/05/2007  . HYPERLIPIDEMIA 09/05/2007  . HYPERTENSION 07/25/2007  . OSTEOARTHRITIS 09/05/2007  . SLEEP APNEA, OBSTRUCTIVE 09/05/2007    ALLERGIES:  has No Known Allergies.  MEDICATIONS:  Current Outpatient Prescriptions  Medication Sig Dispense Refill  . amoxicillin-clavulanate (AUGMENTIN) 875-125 MG tablet Take 1 tablet by mouth 2 (two) times daily. 20 tablet 0  . aspirin 325 MG tablet Take 325 mg by mouth daily.      [name] atorvastatin (LIPITOR) 20 MG tablet Take 1 tablet (20 mg total) by mouth daily. 90 tablet 3  . cholecalciferol (VITAMIN D) 1000 UNITS tablet Take 1 tablet (1,000 Units total) by mouth daily. 100 tablet 3  . furosemide (LASIX) 40 MG tablet Take 1 tablet (40 mg total) by mouth daily. 90 tablet 3  . metoprolol succinate (TOPROL-XL) 50 MG 24 hr tablet Take 1 tablet (50 mg total) by mouth daily. 90 tablet 3  . Multiple Vitamin (MULTIVITAMIN) tablet Take 1 tablet by mouth daily.      . promethazine-codeine (PHENERGAN WITH CODEINE) 6.25-10 MG/5ML syrup Take 5 mLs  by mouth every 6 (six) hours as needed. 300 mL 0  . sildenafil (VIAGRA) 100 MG tablet Take 100 mg by mouth daily as needed.       No current facility-administered medications for this visit.    SURGICAL HISTORY:  Past Surgical History  Procedure Laterality Date  . Eye surgery  10/22/13    left eye, cataract    REVIEW OF SYSTEMS:  A comprehensive review of systems was negative.   PHYSICAL EXAMINATION: General appearance: alert, cooperative and no distress Head: Normocephalic, without obvious abnormality, atraumatic Neck: no adenopathy Lymph nodes: Cervical, supraclavicular, and axillary nodes normal. Resp: clear to auscultation bilaterally Cardio: regular rate and rhythm, S1, S2 normal, no murmur, click, rub or gallop GI: soft, non-tender; bowel sounds normal; no masses,  no organomegaly Extremities: extremities normal, atraumatic, no cyanosis or edema  ECOG PERFORMANCE STATUS: 1 - Symptomatic but completely ambulatory  Blood pressure 129/58, pulse 50, temperature 97.5 F (36.4 C), temperature source Oral, resp. rate 17, height 5\' 6"  (1.676 m), weight 162 lb (73.483 kg), SpO2 99 %.  LABORATORY DATA: Lab Results  Component Value Date   WBC 32.2* 12/18/2015   HGB 14.4 12/18/2015   HCT 44.7 12/18/2015   MCV 96.0 12/18/2015   PLT 155 12/18/2015      Chemistry      Component Value Date/Time   NA 141 10/10/2015 1639   NA 140 06/14/2015 1459   K 4.6 10/10/2015 1639   K 4.3 06/14/2015 1459   CL 103  10/10/2015 1639   CL 104 12/14/2012 1031   CO2 33* 10/10/2015 1639   CO2 28 06/14/2015 1459   BUN 17 10/10/2015 1639   BUN 13.7 06/14/2015 1459   CREATININE 0.98 10/10/2015 1639   CREATININE 1.0 06/14/2015 1459      Component Value Date/Time   CALCIUM 9.2 10/10/2015 1639   CALCIUM 9.1 06/14/2015 1459   ALKPHOS 72 10/10/2015 1639   ALKPHOS 81 06/14/2015 1459   AST 23 10/10/2015 1639   AST 28 06/14/2015 1459   ALT 21 10/10/2015 1639   ALT 25 06/14/2015 1459   BILITOT  1.4* 10/10/2015 1639   BILITOT 1.64* 06/14/2015 1459       RADIOGRAPHIC STUDIES: No results found.  ASSESSMENT AND PLAN: This is a very pleasant 82 years old white male with history of stage 0 chronic lymphocytic leukemia currently on observation. The patient is doing fine with no significant complaints. His total white blood count as well as absolute lymphocyte count  Heart very similar compared compared to 6 months ago.  I discussed the lab result with the patient.  I recommended for him to continue on observation with repeat CBC, comprehensive metabolic panel and LDH in 6 months.  All questions were answered. The patient knows to call the clinic with any problems, questions or concerns. We can certainly see the patient much sooner if necessary.  Disclaimer: This note was dictated with voice recognition software. Similar sounding words can inadvertently be transcribed and may not be corrected upon review.

## 2015-12-18 NOTE — Telephone Encounter (Signed)
Talked to patient here in office. Scheduled appt.       [facility]. °

## 2015-12-19 ENCOUNTER — Other Ambulatory Visit: Payer: Self-pay | Admitting: Internal Medicine

## 2016-01-15 ENCOUNTER — Other Ambulatory Visit: Payer: Self-pay | Admitting: Internal Medicine

## 2016-04-04 ENCOUNTER — Ambulatory Visit (HOSPITAL_COMMUNITY)
Admission: EM | Admit: 2016-04-04 | Discharge: 2016-04-04 | Disposition: A | Discharge status: Home / Self Care | Payer: Medicare Other | Attending: Emergency Medicine | Admitting: Emergency Medicine

## 2016-04-04 ENCOUNTER — Encounter (HOSPITAL_COMMUNITY): Payer: Self-pay | Admitting: *Deleted

## 2016-04-04 DIAGNOSIS — S51811A Laceration without foreign body of right forearm, initial encounter: Secondary | ICD-10-CM | POA: Diagnosis not present

## 2016-04-04 NOTE — ED Provider Notes (Signed)
HPI  SUBJECTIVE:  Jerry Gomez is a 82 y.o. male who presents with a skin tear in his right forearm. Patient states that he scraped his forearm against a mailbox earlier today. States that it bled copiously, but he washed it out with soap and water, applied antibiotic ointment to it, dressed it, and came here. He achieved hemostasis with pressure.There are no other aggravating or alleviating factors. He has not tried anything else for this.he denies pain, numbness, tingling, deformity, bruising, foreign body sensation. His primary goal is that he wants to know how to take care of it. Past medical history of hypertension, he is on aspirin 325 mg daily. No history of diabetes, immune compromise, he is not on any other anticoagulants or antiplatelets. PMD: Dr. Henderson [place]   Past Medical History  Diagnosis Date  . COLONIC POLYPS 03/02/2009  . EYE SURGERY, HX OF 09/05/2007  . HEMORRHOIDS, HX OF 09/05/2007  . HYPERLIPIDEMIA 09/05/2007  . HYPERTENSION 07/25/2007  . OSTEOARTHRITIS 09/05/2007  . SLEEP APNEA, OBSTRUCTIVE 09/05/2007    Past Surgical History  Procedure Laterality Date  . Eye surgery  10/22/13    left eye, cataract    Family History  Problem Relation Age of Onset  . Kidney disease Father   . Cancer Father     prostate-late on-set  . Cancer Brother     non-hodgkins lymphoma  . Colon cancer Neg Hx   . Esophageal cancer Neg Hx   . Rectal cancer Neg Hx   . Stomach cancer Neg Hx     Social History  Substance Use Topics  . Smoking status: Former Smoker    Types: Cigarettes    Quit date: 02/07/1963  . Smokeless tobacco: Never Used  . Alcohol Use: 7.0 oz/week    14 Standard drinks or equivalent per week     Comment: wine    No current facility-administered medications for this encounter.  Current outpatient prescriptions:  .  aspirin 325 MG tablet, Take 325 mg by mouth daily.  , Disp: , Rfl:  .  atorvastatin (LIPITOR) 20 MG tablet, TAKE 1 TABLET EVERY DAY, Disp: 90 tablet,  Rfl: 3 .  furosemide (LASIX) 40 MG tablet, Take 1 tablet (40 mg total) by mouth daily., Disp: 90 tablet, Rfl: 3 .  metoprolol succinate (TOPROL-XL) 50 MG 24 hr tablet, TAKE 1 TABLET EVERY DAY, Disp: 90 tablet, Rfl: 2 .  Multiple Vitamin (MULTIVITAMIN) tablet, Take 1 tablet by mouth daily.  , Disp: , Rfl:  .  sildenafil (VIAGRA) 100 MG tablet, Take 100 mg by mouth daily as needed.  , Disp: , Rfl:   No Known Allergies   ROS  As noted in HPI.   Physical Exam  BP 150/76 mmHg  Pulse 68  Temp(Src) 97.8 F (36.6 C) (Oral)  Resp 16  SpO2 98%  Constitutional: Well developed, well nourished, no acute distress Eyes:  EOMI, conjunctiva normal bilaterally HENT: Normocephalic, atraumatic,mucus membranes moist Respiratory: Normal inspiratory effort Cardiovascular: Normal rate GI: nondistended skin: 2 skin avulsions right forearm. No tenderness. No foreign bodies seen. See picture. Measures 10 x 5 cm at its biggest.     Musculoskeletal: no deformities Neurologic: Alert & oriented x 3, no focal neuro deficits Psychiatric: Speech and behavior appropriate   ED Course   Medications - No data to display  No orders of the defined types were placed in this encounter.    No results found for this or any previous visit (from the past 24 hour(s)). No results  found.  ED Clinical Impression  Skin tear of forearm without complication, right, initial encounter   ED Assessment/Plan   No evidence of fracture, retained foreign body. Do not see need for systemic antibiotics at this time. Patient is neurovascularly intact distally. Washed wound with soap and water, Debrided Some of the skin, realigned one of the skin flaps. will apply bacitracin, nonstick dressing, Cob[place]. Advised patient to leave this in place for 48 hours, then to continue doing localized wound care with antibiotic ointment. Advised patient to returnor follow-up with his doctor for any symptoms or signs of infection. Patient  agrees with plan.   *This clinic note was created using Dragon dictation software. Therefore, there may be occasional mistakes despite careful proofreading.  ?   Melynda Ripple, MD 04/04/16 1358

## 2016-04-04 NOTE — ED Notes (Signed)
Patient with skin tear to right posterior aspect of forearm, patient takes ASA 324 daily. Has wrapped at this time. Mild bleeding noted through bandage.

## 2016-04-08 ENCOUNTER — Telehealth: Payer: Self-pay | Admitting: Emergency Medicine

## 2016-04-08 ENCOUNTER — Ambulatory Visit (INDEPENDENT_AMBULATORY_CARE_PROVIDER_SITE_OTHER): Payer: Medicare Other | Admitting: Internal Medicine

## 2016-04-08 ENCOUNTER — Other Ambulatory Visit (INDEPENDENT_AMBULATORY_CARE_PROVIDER_SITE_OTHER): Payer: Medicare Other

## 2016-04-08 ENCOUNTER — Encounter: Payer: Self-pay | Admitting: Internal Medicine

## 2016-04-08 VITALS — BP 132/80 | HR 56 | Ht 66.0 in | Wt 162.0 lb

## 2016-04-08 DIAGNOSIS — R972 Elevated prostate specific antigen [PSA]: Secondary | ICD-10-CM

## 2016-04-08 DIAGNOSIS — Z0000 Encounter for general adult medical examination without abnormal findings: Secondary | ICD-10-CM | POA: Diagnosis not present

## 2016-04-08 DIAGNOSIS — Z0001 Encounter for general adult medical examination with abnormal findings: Secondary | ICD-10-CM

## 2016-04-08 DIAGNOSIS — I10 Essential (primary) hypertension: Secondary | ICD-10-CM

## 2016-04-08 DIAGNOSIS — C9110 Chronic lymphocytic leukemia of B-cell type not having achieved remission: Secondary | ICD-10-CM

## 2016-04-08 DIAGNOSIS — R0989 Other specified symptoms and signs involving the circulatory and respiratory systems: Secondary | ICD-10-CM | POA: Diagnosis not present

## 2016-04-08 LAB — HEPATIC FUNCTION PANEL
ALK PHOS: 67 U/L (ref 39–117)
ALT: 20 U/L (ref 0–53)
AST: 22 U/L (ref 0–37)
Albumin: 4.4 g/dL (ref 3.5–5.2)
BILIRUBIN TOTAL: 1.7 mg/dL — AB (ref 0.2–1.2)
Bilirubin, Direct: 0.3 mg/dL (ref 0.0–0.3)
Total Protein: 6.3 g/dL (ref 6.0–8.3)

## 2016-04-08 LAB — BASIC METABOLIC PANEL
BUN: 14 mg/dL (ref 6–23)
CALCIUM: 9.2 mg/dL (ref 8.4–10.5)
CO2: 31 mEq/L (ref 19–32)
CREATININE: 0.88 mg/dL (ref 0.40–1.50)
Chloride: 102 mEq/L (ref 96–112)
GFR: 87.96 mL/min (ref >60.00)
GLUCOSE: 93 mg/dL (ref 70–99)
Potassium: 4.4 mEq/L (ref 3.5–5.1)
SODIUM: 140 meq/L (ref 135–145)

## 2016-04-08 LAB — URINALYSIS
Bilirubin Urine: NEGATIVE
HGB URINE DIPSTICK: NEGATIVE
KETONES UR: NEGATIVE
LEUKOCYTES UA: NEGATIVE
Nitrite: NEGATIVE
SPECIFIC GRAVITY, URINE: 1.01 (ref 1.000–1.030)
Total Protein, Urine: NEGATIVE
URINE GLUCOSE: NEGATIVE
UROBILINOGEN UA: 0.2 (ref 0.0–1.0)
pH: 7 (ref 5.0–8.0)

## 2016-04-08 LAB — CBC WITH DIFFERENTIAL/PLATELET
BASOS ABS: 0.1 10*3/uL (ref 0.0–0.1)
Basophils Relative: 0.3 % (ref 0.0–3.0)
EOS ABS: 0.1 10*3/uL (ref 0.0–0.7)
Eosinophils Relative: 0.3 % (ref 0.0–5.0)
HCT: 42 % (ref 39.0–52.0)
Hemoglobin: 13.9 g/dL (ref 13.0–17.0)
LYMPHS ABS: 29.7 10*3/uL — AB (ref 0.7–4.0)
Lymphocytes Relative: 89 % — ABNORMAL HIGH (ref 12.0–46.0)
MCHC: 33 g/dL (ref 30.0–36.0)
MCV: 94.6 fl (ref 78.0–100.0)
MONO ABS: 0.4 10*3/uL (ref 0.1–1.0)
Monocytes Relative: 1.3 % — ABNORMAL LOW (ref 3.0–12.0)
NEUTROS PCT: 9.1 % — AB (ref 43.0–77.0)
Neutro Abs: 3 10*3/uL (ref 1.4–7.7)
Platelets: 136 10*3/uL — ABNORMAL LOW (ref 150.0–400.0)
RBC: 4.43 Mil/uL (ref 4.22–5.81)
RDW: 13.9 % (ref 11.5–15.5)
WBC: 33.3 10*3/uL (ref 4.0–10.5)

## 2016-04-08 LAB — LIPID PANEL
Cholesterol: 127 mg/dL (ref 0–200)
HDL: 52.1 mg/dL (ref >39.00)
LDL Cholesterol: 62 mg/dL (ref 0–99)
NONHDL: 75.37
Total CHOL/HDL Ratio: 2
Triglycerides: 67 mg/dL (ref 0.0–149.0)
VLDL: 13.4 mg/dL (ref 0.0–40.0)

## 2016-04-08 LAB — TSH: TSH: 1.12 u[IU]/mL (ref 0.35–4.50)

## 2016-04-08 LAB — PSA: PSA: 6.57 ng/mL — ABNORMAL HIGH (ref 0.10–4.00)

## 2016-04-08 MED ORDER — ASPIRIN 162 MG PO TBEC: 162.0000 mg | DELAYED_RELEASE_TABLET | Freq: Every day | ORAL | Status: DC

## 2016-04-08 MED ORDER — FUROSEMIDE 40 MG PO TABS: 20.0000 mg | ORAL_TABLET | Freq: Every day | ORAL | Status: DC

## 2016-04-08 NOTE — Assessment & Plan Note (Signed)

## 2016-04-08 NOTE — Assessment & Plan Note (Signed)
[place] ordered ASA

## 2016-04-08 NOTE — Progress Notes (Signed)
Pre visit review using our clinic review tool, if applicable. No additional management support is needed unless otherwise documented below in the visit note. 

## 2016-04-08 NOTE — Assessment & Plan Note (Signed)
PSA

## 2016-04-08 NOTE — Assessment & Plan Note (Signed)
Reduce Furosemide to 20 mg/d if possible

## 2016-04-08 NOTE — Telephone Encounter (Signed)
Lab called for Critical Value for WBCs at 33.3

## 2016-04-08 NOTE — Assessment & Plan Note (Signed)
CBC

## 2016-04-08 NOTE — Progress Notes (Signed)
Subjective:  Patient ID: Jerry Gomez, male    DOB: [birth year]  Age: 82 y.o. MRN: [id number]  CC: No chief complaint on file.   HPI Jerry Gomez presents for a well exam  Outpatient Prescriptions Prior to Visit  Medication Sig Dispense Refill  . aspirin 325 MG tablet Take 325 mg by mouth daily.      [name] atorvastatin (LIPITOR) 20 MG tablet TAKE 1 TABLET EVERY DAY 90 tablet 3  . furosemide (LASIX) 40 MG tablet Take 1 tablet (40 mg total) by mouth daily. 90 tablet 3  . metoprolol succinate (TOPROL-XL) 50 MG 24 hr tablet TAKE 1 TABLET EVERY DAY 90 tablet 2  . Multiple Vitamin (MULTIVITAMIN) tablet Take 1 tablet by mouth daily.      . sildenafil (VIAGRA) 100 MG tablet Take 100 mg by mouth daily as needed. Reported on 04/08/2016     No facility-administered medications prior to visit.    ROS Review of Systems  Constitutional: Negative for appetite change, fatigue and unexpected weight change.  HENT: Negative for congestion, nosebleeds, sneezing, sore throat and trouble swallowing.   Eyes: Negative for itching and visual disturbance.  Respiratory: Negative for cough.   Cardiovascular: Negative for chest pain, palpitations and leg swelling.  Gastrointestinal: Negative for nausea, diarrhea, blood in stool and abdominal distention.  Genitourinary: Negative for frequency and hematuria.  Musculoskeletal: Negative for back pain, joint swelling, gait problem and neck pain.  Skin: Negative for rash.  Neurological: Negative for dizziness, tremors, speech difficulty and weakness.  Psychiatric/Behavioral: Negative for sleep disturbance, dysphoric mood and agitation. The patient is not nervous/anxious.     Objective:  BP 132/80 mmHg  Pulse 56  Ht 5\' 6"  (1.676 m)  Wt 162 lb (73.483 kg)  BMI 26.16 kg/m2  SpO2 99%  BP Readings from Last 3 Encounters:  04/08/16 132/80  04/04/16 150/76  12/18/15 129/58    Wt Readings from Last 3 Encounters:  04/08/16 162 lb (73.483 kg)  12/18/15 162 lb  (73.483 kg)  11/01/15 164 lb (74.39 kg)    Physical Exam  Constitutional: He is oriented to person, place, and time. He appears well-developed. No distress.  NAD  HENT:  Mouth/Throat: Oropharynx is clear and moist.  Eyes: Conjunctivae are normal. Pupils are equal, round, and reactive to light.  Neck: Normal range of motion. No JVD present. No thyromegaly present.  Cardiovascular: Normal rate, regular rhythm, normal heart sounds and intact distal pulses.  Exam reveals no gallop and no friction rub.   No murmur heard. Pulmonary/Chest: Effort normal and breath sounds normal. No respiratory distress. He has no wheezes. He has no rales. He exhibits no tenderness.  Abdominal: Soft. Bowel sounds are normal. He exhibits no distension and no mass. There is no tenderness. There is no rebound and no guarding.  Musculoskeletal: Normal range of motion. He exhibits no edema or tenderness.  Lymphadenopathy:    He has no cervical adenopathy.  Neurological: He is alert and oriented to person, place, and time. He has normal reflexes. No cranial nerve deficit. He exhibits normal muscle tone. He displays a negative Romberg sign. Coordination and gait normal.  Skin: Skin is warm and dry. No rash noted.  Psychiatric: He has a normal mood and affect. His behavior is normal. Judgment and thought content normal.   R forearm wound is dressed Mild bruit B Lab Results  Component Value Date   WBC 32.2* 12/18/2015   HGB 14.4 12/18/2015   HCT 44.7 12/18/2015  PLT 155 12/18/2015   GLUCOSE 90 12/18/2015   CHOL 137 04/10/2015   TRIG 89.0 04/10/2015   HDL 49.50 04/10/2015   LDLCALC 70 04/10/2015   ALT 19 12/18/2015   AST 24 12/18/2015   NA 141 12/18/2015   K 4.1 12/18/2015   CL 103 10/10/2015   CREATININE 1.3 12/18/2015   BUN 13.8 12/18/2015   CO2 31* 12/18/2015   TSH 1.61 10/11/2014   PSA 7.66* 10/10/2015    No results found.  Assessment & Plan:   There are no diagnoses linked to this encounter. I  am having Mr. Mester maintain his aspirin, multivitamin, sildenafil, furosemide, metoprolol succinate, and atorvastatin.  No orders of the defined types were placed in this encounter.     Follow-up: No Follow-up on file.  Walker Kehr, MD

## 2016-04-11 ENCOUNTER — Ambulatory Visit (HOSPITAL_COMMUNITY)
Admission: RE | Admit: 2016-04-11 | Discharge: 2016-04-11 | Disposition: A | Discharge status: Home / Self Care | Payer: Medicare Other | Source: Ambulatory Visit | Attending: Cardiovascular Disease | Admitting: Cardiovascular Disease

## 2016-04-11 DIAGNOSIS — R0989 Other specified symptoms and signs involving the circulatory and respiratory systems: Secondary | ICD-10-CM | POA: Diagnosis not present

## 2016-04-11 DIAGNOSIS — I6523 Occlusion and stenosis of bilateral carotid arteries: Secondary | ICD-10-CM | POA: Insufficient documentation

## 2016-04-11 DIAGNOSIS — G4733 Obstructive sleep apnea (adult) (pediatric): Secondary | ICD-10-CM | POA: Diagnosis not present

## 2016-04-11 DIAGNOSIS — I10 Essential (primary) hypertension: Secondary | ICD-10-CM | POA: Insufficient documentation

## 2016-04-11 DIAGNOSIS — E785 Hyperlipidemia, unspecified: Secondary | ICD-10-CM | POA: Insufficient documentation

## 2016-04-17 DIAGNOSIS — L853 Xerosis cutis: Secondary | ICD-10-CM | POA: Diagnosis not present

## 2016-04-17 DIAGNOSIS — D1801 Hemangioma of skin and subcutaneous tissue: Secondary | ICD-10-CM | POA: Diagnosis not present

## 2016-04-17 DIAGNOSIS — L814 Other melanin hyperpigmentation: Secondary | ICD-10-CM | POA: Diagnosis not present

## 2016-04-17 DIAGNOSIS — D692 Other nonthrombocytopenic purpura: Secondary | ICD-10-CM | POA: Diagnosis not present

## 2016-04-17 DIAGNOSIS — L821 Other seborrheic keratosis: Secondary | ICD-10-CM | POA: Diagnosis not present

## 2016-04-17 DIAGNOSIS — L570 Actinic keratosis: Secondary | ICD-10-CM | POA: Diagnosis not present

## 2016-04-17 DIAGNOSIS — L720 Epidermal cyst: Secondary | ICD-10-CM | POA: Diagnosis not present

## 2016-04-17 DIAGNOSIS — Z85828 Personal history of other malignant neoplasm of skin: Secondary | ICD-10-CM | POA: Diagnosis not present

## 2016-06-05 DIAGNOSIS — Z85828 Personal history of other malignant neoplasm of skin: Secondary | ICD-10-CM | POA: Diagnosis not present

## 2016-06-05 DIAGNOSIS — L720 Epidermal cyst: Secondary | ICD-10-CM | POA: Diagnosis not present

## 2016-06-17 ENCOUNTER — Ambulatory Visit (HOSPITAL_BASED_OUTPATIENT_CLINIC_OR_DEPARTMENT_OTHER): Payer: Medicare Other | Admitting: Internal Medicine

## 2016-06-17 ENCOUNTER — Encounter: Payer: Self-pay | Admitting: Internal Medicine

## 2016-06-17 ENCOUNTER — Other Ambulatory Visit (HOSPITAL_BASED_OUTPATIENT_CLINIC_OR_DEPARTMENT_OTHER): Payer: Medicare Other

## 2016-06-17 VITALS — BP 132/61 | HR 65 | Temp 98.2°F | Resp 18 | Ht 66.0 in | Wt 159.9 lb

## 2016-06-17 DIAGNOSIS — C9110 Chronic lymphocytic leukemia of B-cell type not having achieved remission: Secondary | ICD-10-CM | POA: Diagnosis not present

## 2016-06-17 LAB — COMPREHENSIVE METABOLIC PANEL
ALT: 21 U/L (ref 0–55)
AST: 24 U/L (ref 5–34)
Albumin: 4.1 g/dL (ref 3.5–5.0)
Alkaline Phosphatase: 81 U/L (ref 40–150)
Anion Gap: 10 mEq/L (ref 3–11)
BILIRUBIN TOTAL: 1.67 mg/dL — AB (ref 0.20–1.20)
BUN: 15.5 mg/dL (ref 7.0–26.0)
CHLORIDE: 104 meq/L (ref 98–109)
CO2: 28 meq/L (ref 22–29)
CREATININE: 1.1 mg/dL (ref 0.7–1.3)
Calcium: 9 mg/dL (ref 8.4–10.4)
EGFR: 60 mL/min/{1.73_m2} — AB (ref >90)
Glucose: 107 mg/dl (ref 70–140)
Potassium: 3.9 mEq/L (ref 3.5–5.1)
SODIUM: 142 meq/L (ref 136–145)
TOTAL PROTEIN: 6.6 g/dL (ref 6.4–8.3)

## 2016-06-17 LAB — CBC WITH DIFFERENTIAL/PLATELET
BASO%: 0.3 % (ref 0.0–2.0)
Basophils Absolute: 0.1 10*3/uL (ref 0.0–0.1)
EOS ABS: 0.1 10*3/uL (ref 0.0–0.5)
EOS%: 0.4 % (ref 0.0–7.0)
HCT: 43.6 % (ref 38.4–49.9)
HEMOGLOBIN: 14.2 g/dL (ref 13.0–17.1)
LYMPH%: 88.2 % — AB (ref 14.0–49.0)
MCH: 31.5 pg (ref 27.2–33.4)
MCHC: 32.6 g/dL (ref 32.0–36.0)
MCV: 96.7 fL (ref 79.3–98.0)
MONO#: 0.5 10*3/uL (ref 0.1–0.9)
MONO%: 1.6 % (ref 0.0–14.0)
NEUT%: 9.5 % — ABNORMAL LOW (ref 39.0–75.0)
NEUTROS ABS: 3.1 10*3/uL (ref 1.5–6.5)
Platelets: 128 10*3/uL — ABNORMAL LOW (ref 140–400)
RBC: 4.51 10*6/uL (ref 4.20–5.82)
RDW: 13.9 % (ref 11.0–14.6)
WBC: 32.9 10*3/uL — AB (ref 4.0–10.3)
lymph#: 29.1 10*3/uL — ABNORMAL HIGH (ref 0.9–3.3)

## 2016-06-17 LAB — LACTATE DEHYDROGENASE: LDH: 204 U/L (ref 125–245)

## 2016-06-17 LAB — TECHNOLOGIST REVIEW

## 2016-06-17 NOTE — Progress Notes (Signed)
[place] Telephone:([phone]) [phone]   Fax:([phone]  OFFICE PROGRESS NOTE  Walker Kehr, MD [place] [address]  DIAGNOSIS: Stage 0 Chronic lymphocytic leukemia   PRIOR THERAPY: None   CURRENT THERAPY: Observation  INTERVAL HISTORY: LEONITUS RAFALSKI 82 y.o. male returns to the clinic today for six-month followup visit. The patient is feeling fine today with no specific complaints. He denied having any significant weight loss or night sweats. He denied having any palpable lymphadenopathy. The patient denied having any significant chest pain, shortness of breath, cough or hemoptysis. He had repeat CBC performed earlier today and he is here for evaluation and discussion of his lab results.  MEDICAL HISTORY: Past Medical History  Diagnosis Date  . COLONIC POLYPS 03/02/2009  . EYE SURGERY, HX OF 09/05/2007  . HEMORRHOIDS, HX OF 09/05/2007  . HYPERLIPIDEMIA 09/05/2007  . HYPERTENSION 07/25/2007  . OSTEOARTHRITIS 09/05/2007  . SLEEP APNEA, OBSTRUCTIVE 09/05/2007    ALLERGIES:  has No Known Allergies.  MEDICATIONS:  Current Outpatient Prescriptions  Medication Sig Dispense Refill  . atorvastatin (LIPITOR) 20 MG tablet TAKE 1 TABLET EVERY DAY 90 tablet 3  . metoprolol succinate (TOPROL-XL) 50 MG 24 hr tablet TAKE 1 TABLET EVERY DAY 90 tablet 2  . Multiple Vitamin (MULTIVITAMIN) tablet Take 1 tablet by mouth daily.      [name] aspirin 162 MG EC tablet Take 1 tablet (162 mg total) by mouth daily. 100 tablet 3  . furosemide (LASIX) 40 MG tablet Take 0.5-1 tablets (20-40 mg total) by mouth daily. 90 tablet 3   No current facility-administered medications for this visit.    SURGICAL HISTORY:  Past Surgical History  Procedure Laterality Date  . Eye surgery  10/22/13    left eye, cataract    REVIEW OF SYSTEMS:  A comprehensive review of systems was negative.   PHYSICAL EXAMINATION: General appearance: alert, cooperative and no distress Head:  Normocephalic, without obvious abnormality, atraumatic Neck: no adenopathy Lymph nodes: Cervical, supraclavicular, and axillary nodes normal. Resp: clear to auscultation bilaterally Cardio: regular rate and rhythm, S1, S2 normal, no murmur, click, rub or gallop GI: soft, non-tender; bowel sounds normal; no masses,  no organomegaly Extremities: extremities normal, atraumatic, no cyanosis or edema  ECOG PERFORMANCE STATUS: 1 - Symptomatic but completely ambulatory  Blood pressure 132/61, pulse 65, temperature 98.2 F (36.8 C), temperature source Oral, resp. rate 18, height 5\' 6"  (1.676 m), weight 159 lb 14.4 oz (72.53 kg), SpO2 100 %.  LABORATORY DATA: Lab Results  Component Value Date   WBC 32.9* 06/17/2016   HGB 14.2 06/17/2016   HCT 43.6 06/17/2016   MCV 96.7 06/17/2016   PLT 128* 06/17/2016      Chemistry      Component Value Date/Time   NA 140 04/08/2016 1130   NA 141 12/18/2015 1317   K 4.4 04/08/2016 1130   K 4.1 12/18/2015 1317   CL 102 04/08/2016 1130   CL 104 12/14/2012 1031   CO2 31 04/08/2016 1130   CO2 31* 12/18/2015 1317   BUN 14 04/08/2016 1130   BUN 13.8 12/18/2015 1317   CREATININE 0.88 04/08/2016 1130   CREATININE 1.3 12/18/2015 1317      Component Value Date/Time   CALCIUM 9.2 04/08/2016 1130   CALCIUM 9.2 12/18/2015 1317   ALKPHOS 67 04/08/2016 1130   ALKPHOS 81 12/18/2015 1317   AST 22 04/08/2016 1130   AST 24 12/18/2015 1317   ALT 20  04/08/2016 1130   ALT 19 12/18/2015 1317   BILITOT 1.7* 04/08/2016 1130   BILITOT 1.50* 12/18/2015 1317       RADIOGRAPHIC STUDIES: No results found.  ASSESSMENT AND PLAN: This is a very pleasant 82 years old white male with history of stage 0 chronic lymphocytic leukemia currently on observation. The patient is doing fine with no significant complaints. No significant change in his CBC compared to 6 months ago. I discussed the lab result with the patient.  I recommended for him to continue on observation  with repeat CBC, comprehensive metabolic panel and LDH in 6 months.  All questions were answered. The patient knows to call the clinic with any problems, questions or concerns. We can certainly see the patient much sooner if necessary.  Disclaimer: This note was dictated with voice recognition software. Similar sounding words can inadvertently be transcribed and may not be corrected upon review.

## 2016-07-02 DIAGNOSIS — Z961 Presence of intraocular lens: Secondary | ICD-10-CM | POA: Diagnosis not present

## 2016-07-02 DIAGNOSIS — H35373 Puckering of macula, bilateral: Secondary | ICD-10-CM | POA: Diagnosis not present

## 2016-07-02 DIAGNOSIS — H18411 Arcus senilis, right eye: Secondary | ICD-10-CM | POA: Diagnosis not present

## 2016-07-02 DIAGNOSIS — H1849 Other corneal degeneration: Secondary | ICD-10-CM | POA: Insufficient documentation

## 2016-07-02 DIAGNOSIS — H18421 Band keratopathy, right eye: Secondary | ICD-10-CM | POA: Diagnosis not present

## 2016-08-28 DIAGNOSIS — H1843 Other calcerous corneal degeneration: Secondary | ICD-10-CM | POA: Diagnosis not present

## 2016-08-28 DIAGNOSIS — Z961 Presence of intraocular lens: Secondary | ICD-10-CM | POA: Diagnosis not present

## 2016-08-29 ENCOUNTER — Ambulatory Visit (INDEPENDENT_AMBULATORY_CARE_PROVIDER_SITE_OTHER): Payer: Medicare Other

## 2016-08-29 DIAGNOSIS — Z23 Encounter for immunization: Secondary | ICD-10-CM

## 2016-09-13 DIAGNOSIS — Z85828 Personal history of other malignant neoplasm of skin: Secondary | ICD-10-CM | POA: Diagnosis not present

## 2016-09-13 DIAGNOSIS — D2239 Melanocytic nevi of other parts of face: Secondary | ICD-10-CM | POA: Diagnosis not present

## 2016-09-13 DIAGNOSIS — L821 Other seborrheic keratosis: Secondary | ICD-10-CM | POA: Diagnosis not present

## 2016-09-13 DIAGNOSIS — I788 Other diseases of capillaries: Secondary | ICD-10-CM | POA: Diagnosis not present

## 2016-09-13 DIAGNOSIS — C4442 Squamous cell carcinoma of skin of scalp and neck: Secondary | ICD-10-CM | POA: Diagnosis not present

## 2016-09-13 DIAGNOSIS — L720 Epidermal cyst: Secondary | ICD-10-CM | POA: Diagnosis not present

## 2016-09-13 DIAGNOSIS — L570 Actinic keratosis: Secondary | ICD-10-CM | POA: Diagnosis not present

## 2016-09-25 DIAGNOSIS — C4442 Squamous cell carcinoma of skin of scalp and neck: Secondary | ICD-10-CM | POA: Diagnosis not present

## 2016-09-25 DIAGNOSIS — Z85828 Personal history of other malignant neoplasm of skin: Secondary | ICD-10-CM | POA: Diagnosis not present

## 2016-10-09 ENCOUNTER — Encounter: Payer: Self-pay | Admitting: Internal Medicine

## 2016-10-09 ENCOUNTER — Other Ambulatory Visit (INDEPENDENT_AMBULATORY_CARE_PROVIDER_SITE_OTHER): Payer: Medicare Other

## 2016-10-09 ENCOUNTER — Ambulatory Visit (INDEPENDENT_AMBULATORY_CARE_PROVIDER_SITE_OTHER): Payer: Medicare Other | Admitting: Internal Medicine

## 2016-10-09 DIAGNOSIS — R972 Elevated prostate specific antigen [PSA]: Secondary | ICD-10-CM

## 2016-10-09 DIAGNOSIS — L723 Sebaceous cyst: Secondary | ICD-10-CM

## 2016-10-09 DIAGNOSIS — E785 Hyperlipidemia, unspecified: Secondary | ICD-10-CM

## 2016-10-09 DIAGNOSIS — C9110 Chronic lymphocytic leukemia of B-cell type not having achieved remission: Secondary | ICD-10-CM

## 2016-10-09 DIAGNOSIS — I10 Essential (primary) hypertension: Secondary | ICD-10-CM

## 2016-10-09 DIAGNOSIS — R001 Bradycardia, unspecified: Secondary | ICD-10-CM

## 2016-10-09 LAB — BASIC METABOLIC PANEL
BUN: 12 mg/dL (ref 6–23)
CALCIUM: 9.4 mg/dL (ref 8.4–10.5)
CHLORIDE: 104 meq/L (ref 96–112)
CO2: 30 mEq/L (ref 19–32)
CREATININE: 0.89 mg/dL (ref 0.40–1.50)
GFR: 86.71 mL/min (ref >60.00)
Glucose, Bld: 93 mg/dL (ref 70–99)
Potassium: 4.8 mEq/L (ref 3.5–5.1)
SODIUM: 141 meq/L (ref 135–145)

## 2016-10-09 LAB — PSA: PSA: 8.47 ng/mL — AB (ref 0.10–4.00)

## 2016-10-09 MED ORDER — THERA VITAL M PO TABS: 1.0000 | ORAL_TABLET | Freq: Every day | ORAL | 3 refills | Status: DC

## 2016-10-09 NOTE — Assessment & Plan Note (Signed)
Toprol and Furosemide

## 2016-10-09 NOTE — Assessment & Plan Note (Signed)
appt q6 mo w/Onc

## 2016-10-09 NOTE — Progress Notes (Signed)
Subjective:  Patient ID: Jerry Gomez, male    DOB: 08/27/1931  Age: 82 y.o. MRN: [id number]  CC: No chief complaint on file.   HPI JAHSAI PENNACHIO presents for HTN, dyslipidemia, bradycardia. No weakness  Outpatient Medications Prior to Visit  Medication Sig Dispense Refill  . aspirin 162 MG EC tablet Take 1 tablet (162 mg total) by mouth daily. 100 tablet 3  . atorvastatin (LIPITOR) 20 MG tablet TAKE 1 TABLET EVERY DAY 90 tablet 3  . furosemide (LASIX) 40 MG tablet Take 0.5-1 tablets (20-40 mg total) by mouth daily. 90 tablet 3  . metoprolol succinate (TOPROL-XL) 50 MG 24 hr tablet TAKE 1 TABLET EVERY DAY 90 tablet 2  . Multiple Vitamin (MULTIVITAMIN) tablet Take 1 tablet by mouth daily.       No facility-administered medications prior to visit.     ROS Review of Systems  Constitutional: Negative for appetite change, fatigue and unexpected weight change.  HENT: Negative for congestion, nosebleeds, sneezing, sore throat and trouble swallowing.   Eyes: Negative for itching and visual disturbance.  Respiratory: Negative for cough.   Cardiovascular: Negative for chest pain, palpitations and leg swelling.  Gastrointestinal: Negative for abdominal distention, blood in stool, diarrhea and nausea.  Genitourinary: Negative for frequency and hematuria.  Musculoskeletal: Negative for back pain, gait problem, joint swelling and neck pain.  Skin: Negative for rash.  Neurological: Negative for dizziness, tremors, speech difficulty and weakness.  Psychiatric/Behavioral: Negative for agitation, dysphoric mood and sleep disturbance. The patient is not nervous/anxious.     Objective:  BP (!) 130/58   Pulse (!) 44   Wt 158 lb (71.7 kg)   SpO2 97%   BMI 25.50 kg/m   BP Readings from Last 3 Encounters:  10/09/16 (!) 130/58  06/17/16 132/61  04/08/16 132/80    Wt Readings from Last 3 Encounters:  10/09/16 158 lb (71.7 kg)  06/17/16 159 lb 14.4 oz (72.5 kg)  04/08/16 162 lb (73.5 kg)      Physical Exam  Constitutional: He is oriented to person, place, and time. He appears well-developed. No distress.  NAD  HENT:  Mouth/Throat: Oropharynx is clear and moist.  Eyes: Conjunctivae are normal. Pupils are equal, round, and reactive to light.  Neck: Normal range of motion. No JVD present. No thyromegaly present.  Cardiovascular: Regular rhythm, normal heart sounds and intact distal pulses.  Exam reveals no gallop and no friction rub.   No murmur heard. Pulmonary/Chest: Effort normal and breath sounds normal. No respiratory distress. He has no wheezes. He has no rales. He exhibits no tenderness.  Abdominal: Soft. Bowel sounds are normal. He exhibits no distension and no mass. There is no tenderness. There is no rebound and no guarding.  Musculoskeletal: Normal range of motion. He exhibits no edema or tenderness.  Lymphadenopathy:    He has no cervical adenopathy.  Neurological: He is alert and oriented to person, place, and time. He has normal reflexes. No cranial nerve deficit. He exhibits normal muscle tone. He displays a negative Romberg sign. Coordination and gait normal.  Skin: Skin is warm and dry. No rash noted.  Psychiatric: He has a normal mood and affect. His behavior is normal. Judgment and thought content normal.  bradycardic R trap 5 cm seb cyst  Lab Results  Component Value Date   WBC 32.9 (H) 06/17/2016   HGB 14.2 06/17/2016   HCT 43.6 06/17/2016   PLT 128 (L) 06/17/2016   GLUCOSE 107 06/17/2016  CHOL 127 04/08/2016   TRIG 67.0 04/08/2016   HDL 52.10 04/08/2016   LDLCALC 62 04/08/2016   ALT 21 06/17/2016   AST 24 06/17/2016   NA 142 06/17/2016   K 3.9 06/17/2016   CL 102 04/08/2016   CREATININE 1.1 06/17/2016   BUN 15.5 06/17/2016   CO2 28 06/17/2016   TSH 1.12 04/08/2016   PSA 6.57 (H) 04/08/2016    No results found.  Assessment & Plan:   There are no diagnoses linked to this encounter. I am having Mr. Witucki maintain his multivitamin,  metoprolol succinate, atorvastatin, furosemide, aspirin, and fluorouracil.  Meds ordered this encounter  Medications  . fluorouracil (EFUDEX) 5 % cream     Follow-up: No Follow-up on file.  Walker Kehr, MD

## 2016-10-09 NOTE — Progress Notes (Signed)
Pre visit review using our clinic review tool, if applicable. No additional management support is needed unless otherwise documented below in the visit note. 

## 2016-10-09 NOTE — Assessment & Plan Note (Signed)
Back 5 cm Derm f/u

## 2016-10-09 NOTE — Assessment & Plan Note (Signed)
Monitoring PSA 

## 2016-10-09 NOTE — Assessment & Plan Note (Signed)
Chronic in the 50's

## 2016-10-09 NOTE — Assessment & Plan Note (Signed)
Lipitor 

## 2016-10-18 ENCOUNTER — Telehealth: Payer: Self-pay | Admitting: Emergency Medicine

## 2016-10-18 MED ORDER — METOPROLOL SUCCINATE ER 50 MG PO TB24: 50.0000 mg | ORAL_TABLET | Freq: Every day | ORAL | 2 refills | Status: DC

## 2016-10-18 NOTE — Telephone Encounter (Signed)
erx sent per pt rq.

## 2016-10-18 NOTE — Telephone Encounter (Signed)
Pt came in and stated he needs a prescription refill on his metoprolol succinate (TOPROL-XL) 50 MG 24 hr tablet. 90 day supply to [facility]. Please advise thanks.

## 2016-11-29 ENCOUNTER — Ambulatory Visit (INDEPENDENT_AMBULATORY_CARE_PROVIDER_SITE_OTHER): Payer: Medicare Other | Admitting: Emergency Medicine

## 2016-11-29 VITALS — BP 112/68 | HR 60 | Temp 98.0°F | Resp 18 | Ht 66.0 in | Wt 159.0 lb

## 2016-11-29 DIAGNOSIS — L723 Sebaceous cyst: Secondary | ICD-10-CM

## 2016-11-29 DIAGNOSIS — L089 Local infection of the skin and subcutaneous tissue, unspecified: Secondary | ICD-10-CM

## 2016-11-29 MED ORDER — CEPHALEXIN 500 MG PO CAPS: 500.0000 mg | ORAL_CAPSULE | Freq: Two times a day (BID) | ORAL | 0 refills | Status: DC

## 2016-11-29 NOTE — Patient Instructions (Addendum)
Please return tomorrow to have the packing changed, and rechecked.   Take the antibiotic as prescribed. They will instruct you going forward, but you will change the dressing every day after tomorrow.  You may shower, leaving the top dressing in place, but then immediately change it after it is soiled.   You may use tylenol for pain.    Incision and Drainage, Care After Refer to this sheet in the next few weeks. These instructions provide you with information about caring for yourself after your procedure. Your health care provider may also give you more specific instructions. Your treatment has been planned according to current medical practices, but problems sometimes occur. Call your health care provider if you have any problems or questions after your procedure. What can I expect after the procedure? After the procedure, it is common to have:  Pain or discomfort around your incision site.  Drainage from your incision. Follow these instructions at home:  Take over-the-counter and prescription medicines only as told by your health care provider.  If you were prescribed an antibiotic medicine, take it as told by your health care provider.Do not stop taking the antibiotic even if you start to feel better.  Followinstructions from your health care provider about:  How to take care of your incision.  When and how you should change your packing and bandage (dressing). Wash your hands with soap and water before you change your dressing. If soap and water are not available, use hand sanitizer.  When you should remove your dressing.  Do not take baths, swim, or use a hot tub until your health care provider approves.  Keep all follow-up visits as told by your health care provider. This is important.  Check your incision area every day for signs of infection. Check for:  More redness, swelling, or pain.  More fluid or blood.  Warmth.  Pus or a bad smell. Contact a health care  provider if:  Your cyst or abscess returns.  You have a fever.  You have more redness, swelling, or pain around your incision.  You have more fluid or blood coming from your incision.  Your incision feels warm to the touch.  You have pus or a bad smell coming from your incision. Get help right away if:  You have severe pain or bleeding.  You cannot eat or drink without vomiting.  You have decreased urine output.  You become short of breath.  You have chest pain.  You cough up blood.  The area where the incision and drainage occurred becomes numb or it tingles. This information is not intended to replace advice given to you by your health care provider. Make sure you discuss any questions you have with your health care provider. Document Released: 02/17/2012 Document Revised: 04/26/2016 Document Reviewed: 09/15/2015 Elsevier Interactive Patient Education  2017 [employer].   IF you received an x-ray today, you will receive an invoice from [facility]. Please contact [facility] at [phone] with questions or concerns regarding your invoice.   IF you received labwork today, you will receive an invoice from [facility]. Please contact [facility] at [phone] with questions or concerns regarding your invoice.   Our billing staff will not be able to assist you with questions regarding bills from these companies.  You will be contacted with the lab results as soon as they are available. The fastest way to get your results is to activate your My Chart account. Instructions are located on the last page of  this paperwork. If you have not heard from [place] regarding the results in 2 weeks, please contact this office.

## 2016-11-29 NOTE — Progress Notes (Signed)
Patient ID: Jerry Gomez, male   DOB: [birth year], 82 y.o.   MRN: [id number]    By signing my name below, I, Essence Howell, attest that this documentation has been prepared under the direction and in the presence of Darlyne Russian, MD Electronically Signed: Ladene Artist, ED Scribe 11/29/2016 at 10:49 AM.  Chief Complaint:  Chief Complaint  Patient presents with  . Cyst    BACK AREA   HPI: Jerry Gomez is a 82 y.o. male who reports to [facility] today complaining of a non-painful, draining cyst to the right upper back first noticed several months ago. Pt states that he has had the cyst evaluated by his PCP Walker Kehr, MD 6 weeks ago as well as his dermatologist multiple times who have advised to monitor the area. Pt states that he noticed bleeding from the area last night while drying the area with a towel after his shower. His wife has applied a Band-Aid and antibiotic ointment to the area last night. No known drug allergies.   Past Medical History:  Diagnosis Date  . Cancer ([place])   . Cataract   . COLONIC POLYPS 03/02/2009  . EYE SURGERY, HX OF 09/05/2007  . HEMORRHOIDS, HX OF 09/05/2007  . HYPERLIPIDEMIA 09/05/2007  . HYPERTENSION 07/25/2007  . OSTEOARTHRITIS 09/05/2007  . SLEEP APNEA, OBSTRUCTIVE 09/05/2007   Past Surgical History:  Procedure Laterality Date  . EYE SURGERY  10/22/13   left eye, cataract   Social History   Social History  . Marital status: Married    Spouse name: N/A  . Number of children: 2  . Years of education: 13   Occupational History  . retired    Social History Main Topics  . Smoking status: Former Smoker    Types: Cigarettes    Quit date: 02/07/1963  . Smokeless tobacco: Never Used  . Alcohol use 7.0 oz/week    14 Standard drinks or equivalent per week     Comment: wine  . Drug use: No  . Sexual activity: Yes    Partners: Female   Other Topics Concern  . None   Social History Narrative   HS[employer], [place]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  Married '54. 2 sons ' 63, '67. 2 grandchildren.   Work - retired '07. [employer] - son is second 14. CPR- yes, short-term mechanical ventilation, no prolonged heroic or futile measures.    Family History  Problem Relation Age of Onset  . Kidney disease Father   . Cancer Father     prostate-late on-set  . Cancer Brother     non-hodgkins lymphoma  . Colon cancer Neg Hx   . Esophageal cancer Neg Hx   . Rectal cancer Neg Hx   . Stomach cancer Neg Hx    No Known Allergies Prior to Admission medications   Medication Sig Start Date End Date Taking? Authorizing Provider  aspirin 162 MG EC tablet Take 1 tablet (162 mg total) by mouth daily. 04/08/16  Yes [name] [name], MD  atorvastatin (LIPITOR) 20 MG tablet TAKE 1 TABLET EVERY DAY 01/15/16  Yes Cassandria Anger, MD  fluorouracil (EFUDEX) 5 % cream  09/13/16  Yes Historical Provider, MD  furosemide (LASIX) 40 MG tablet Take 0.5-1 tablets (20-40 mg total) by mouth daily. 04/08/16  Yes [name] [name], MD  metoprolol succinate (TOPROL-XL) 50 MG 24 hr tablet Take 1 tablet (50 mg total) by mouth daily. Take with or immediately following a meal. 10/18/16  Yes [name]  [name], MD  Multiple Vitamin (MULTIVITAMIN) tablet Take 1 tablet by mouth daily.     Yes Historical Provider, MD  Multiple Vitamins-Minerals (MULTIVITAMIN) tablet Take 1 tablet by mouth daily. 10/09/16  Yes [name] [name], MD   ROS: The patient denies fevers, chills, night sweats, unintentional weight loss, chest pain, palpitations, wheezing, dyspnea on exertion, nausea, vomiting, abdominal pain, dysuria, hematuria, melena, numbness, weakness, or tingling.   All other systems have been reviewed and were otherwise negative with the exception of those mentioned in the HPI and as above.    PHYSICAL EXAM: Vitals:   11/29/16 0928  BP: 112/68  Pulse: 60  Resp: 18  Temp: 98 F (36.7 C)   Body mass index is 25.66 kg/m.  General: Alert, no acute distress HEENT:   Normocephalic, atraumatic, oropharynx patent. Eye: Juliette Mangle [facility] Cardiovascular:  Regular rate and rhythm, no rubs murmurs or gallops.  No Carotid bruits, radial pulse intact. No pedal edema.  Respiratory: Clear to auscultation bilaterally.  No wheezes, rales, or rhonchi.  No cyanosis, no use of accessory musculature Abdominal: No organomegaly, abdomen is soft and non-tender, positive bowel sounds.  No masses. Musculoskeletal: Gait intact. No edema, tenderness Skin: No rashes. R upper back just above the scapula is a 3x3 cm fluctuant area with overlying redness of the skin. There is a small 41mm opening at the base of the area with spontaneous purulent drainage.  Neurologic: Facial musculature symmetric. Psychiatric: Patient acts appropriately throughout our interaction. Lymphatic: No cervical or submandibular lymphadenopathy  LABS:  EKG/XRAY:   Primary read interpreted by Dr. Everlene Farrier at [facility].  ASSESSMENT/PLAN: I&D will be performed by Ivar Drape. Patient will be on cephalexin 500 twice a day. He will return to clinic tomorrow for packing removal.I personally performed the services described in this documentation, which was scribed in my presence. The recorded information has been reviewed and is accurate.   Gross sideeffects, risk and benefits, and alternatives of medications d/w patient. Patient is aware that all medications have potential sideeffects and we are unable to predict every sideeffect or drug-drug interaction that may occur.  Arlyss Queen MD 11/29/2016 10:43 AM

## 2016-11-30 ENCOUNTER — Ambulatory Visit (INDEPENDENT_AMBULATORY_CARE_PROVIDER_SITE_OTHER): Payer: Medicare Other | Admitting: Urgent Care

## 2016-11-30 VITALS — BP 122/72 | HR 58 | Temp 98.0°F | Resp 17 | Ht 66.0 in | Wt 159.0 lb

## 2016-11-30 DIAGNOSIS — L723 Sebaceous cyst: Secondary | ICD-10-CM

## 2016-11-30 DIAGNOSIS — L089 Local infection of the skin and subcutaneous tissue, unspecified: Secondary | ICD-10-CM

## 2016-11-30 DIAGNOSIS — Z5189 Encounter for other specified aftercare: Secondary | ICD-10-CM

## 2016-11-30 NOTE — Patient Instructions (Addendum)
Epidermal Cyst Removal, Care After Introduction Refer to this sheet in the next few weeks. These instructions provide you with information about caring for yourself after your procedure. Your health care provider may also give you more specific instructions. Your treatment has been planned according to current medical practices, but problems sometimes occur. Call your health care provider if you have any problems or questions after your procedure. What can I expect after the procedure? After the procedure, it is common to have:  Soreness in the area where your cyst was removed.  Tightness or itching from your skin sutures. Follow these instructions at home:  Take medicines only as directed by your health care provider.  If you were prescribed an antibiotic medicine, finish all of it even if you start to feel better.  Use antibiotic ointment as directed by your health care provider. Follow the instructions carefully.  There are many different ways to close and cover an incision, including stitches (sutures), skin glue, and adhesive strips. Follow your health care provider's instructions about:  Incision care.  Bandage (dressing) changes and removal.  Incision closure removal.  Keep the bandage (dressing) dry until your health care provider says that it can be removed. Take sponge baths only. Ask your health care provider when you can start showering or taking a bath.  After your dressing is off, check your incision every day for signs of infection. Watch for:  Redness, swelling, or pain.  Fluid, blood, or pus.  You can return to your normal activities. Do not do anything that stretches or puts pressure on your incision.  You can return to your normal diet.  Keep all follow-up visits as directed by your health care provider. This is important. Contact a health care provider if:  You have a fever.  Your incision bleeds.  You have redness, swelling, or pain in the incision  area.  You have fluid, blood, or pus coming from your incision.  Your cyst comes back after surgery. This information is not intended to replace advice given to you by your health care provider. Make sure you discuss any questions you have with your health care provider. Document Released: 12/16/2014 Document Revised: 05/02/2016 Document Reviewed: 08/10/2014  2017 Elsevier     IF you received an x-ray today, you will receive an invoice from [facility]. Please contact [facility] at [phone] with questions or concerns regarding your invoice.   IF you received labwork today, you will receive an invoice from [place]. Please contact [facility] at [phone] with questions or concerns regarding your invoice.   Our billing staff will not be able to assist you with questions regarding bills from these companies.  You will be contacted with the lab results as soon as they are available. The fastest way to get your results is to activate your My Chart account. Instructions are located on the last page of this paperwork. If you have not heard from [place] regarding the results in 2 weeks, please contact this office.

## 2016-11-30 NOTE — Progress Notes (Addendum)
    MRN: [id number] DOB: 08/08/1931  Subjective:   Jerry Gomez is a 82 y.o. male presenting for chief complaint of Follow-up (Cyst removal yesterday )  Patient had I&D performed yesterday at our clinic. He will be traveling out of town until 12/03/2016. He is coming in today for a dressing change. Admits significant improvement in his symptoms. Has had less pain, no drainage. Denies fever, swelling.   Jerry Gomez has a current medication list which includes the following prescription(s): aspirin, atorvastatin, cephalexin, fluorouracil, furosemide, metoprolol succinate, multivitamin, and multivitamin. Also has No Known Allergies.  Jerry Gomez  has a past medical history of Cancer ([place]); Cataract; COLONIC POLYPS (03/02/2009); EYE SURGERY, HX OF (09/05/2007); HEMORRHOIDS, HX OF (09/05/2007); HYPERLIPIDEMIA (09/05/2007); HYPERTENSION (07/25/2007); OSTEOARTHRITIS (09/05/2007); and SLEEP APNEA, OBSTRUCTIVE (09/05/2007). Also  has a past surgical history that includes Eye surgery (10/22/13).  Objective:   Vitals: BP 122/72 (BP Location: Left Arm, Patient Position: Sitting, Cuff Size: Normal)   Pulse (!) 58   Temp 98 F (36.7 C) (Oral)   Resp 17   Ht 5\' 6"  (1.676 m)   Wt 159 lb (72.1 kg)   SpO2 99%   BMI 25.66 kg/m   Physical Exam  Constitutional: He is oriented to person, place, and time. He appears well-developed and well-nourished.  Cardiovascular: Normal rate.   Pulmonary/Chest: Effort normal.  Neurological: He is alert and oriented to person, place, and time.  Skin:      WOUND CARE: Dressing removed, packing remains in place. There was minimal purulence expressed. Wound repacked with 1/4" packing. Cleansed and dressed.  Assessment and Plan :   1. Encounter for wound care 2. Infected sebaceous cyst - Improved, wound care reviewed. RTC in 3 days.  Jerry Eagles, PA-C [facility] [phone] 11/30/2016 8:58 AM

## 2016-12-01 LAB — WOUND CULTURE: Organism ID, Bacteria: NONE SEEN

## 2016-12-03 ENCOUNTER — Ambulatory Visit (INDEPENDENT_AMBULATORY_CARE_PROVIDER_SITE_OTHER): Payer: Medicare Other | Admitting: Urgent Care

## 2016-12-03 VITALS — BP 122/72 | HR 71 | Temp 97.6°F | Resp 17 | Ht 66.0 in | Wt 159.0 lb

## 2016-12-03 DIAGNOSIS — L723 Sebaceous cyst: Secondary | ICD-10-CM

## 2016-12-03 DIAGNOSIS — Z5189 Encounter for other specified aftercare: Secondary | ICD-10-CM

## 2016-12-03 DIAGNOSIS — L089 Local infection of the skin and subcutaneous tissue, unspecified: Secondary | ICD-10-CM

## 2016-12-03 NOTE — Patient Instructions (Addendum)
Epidermal Cyst Removal, Care After Introduction Refer to this sheet in the next few weeks. These instructions provide you with information about caring for yourself after your procedure. Your health care provider may also give you more specific instructions. Your treatment has been planned according to current medical practices, but problems sometimes occur. Call your health care provider if you have any problems or questions after your procedure. What can I expect after the procedure? After the procedure, it is common to have:  Soreness in the area where your cyst was removed.  Tightness or itching from your skin sutures. Follow these instructions at home:  Take medicines only as directed by your health care provider.  If you were prescribed an antibiotic medicine, finish all of it even if you start to feel better.  Use antibiotic ointment as directed by your health care provider. Follow the instructions carefully.  There are many different ways to close and cover an incision, including stitches (sutures), skin glue, and adhesive strips. Follow your health care provider's instructions about:  Incision care.  Bandage (dressing) changes and removal.  Incision closure removal.  Keep the bandage (dressing) dry until your health care provider says that it can be removed. Take sponge baths only. Ask your health care provider when you can start showering or taking a bath.  After your dressing is off, check your incision every day for signs of infection. Watch for:  Redness, swelling, or pain.  Fluid, blood, or pus.  You can return to your normal activities. Do not do anything that stretches or puts pressure on your incision.  You can return to your normal diet.  Keep all follow-up visits as directed by your health care provider. This is important. Contact a health care provider if:  You have a fever.  Your incision bleeds.  You have redness, swelling, or pain in the incision  area.  You have fluid, blood, or pus coming from your incision.  Your cyst comes back after surgery. This information is not intended to replace advice given to you by your health care provider. Make sure you discuss any questions you have with your health care provider. Document Released: 12/16/2014 Document Revised: 05/02/2016 Document Reviewed: 08/10/2014  2017 Elsevier     IF you received an x-ray today, you will receive an invoice from [facility]. Please contact [facility] at [phone] with questions or concerns regarding your invoice.   IF you received labwork today, you will receive an invoice from [place]. Please contact [facility] at [phone] with questions or concerns regarding your invoice.   Our billing staff will not be able to assist you with questions regarding bills from these companies.  You will be contacted with the lab results as soon as they are available. The fastest way to get your results is to activate your My Chart account. Instructions are located on the last page of this paperwork. If you have not heard from [place] regarding the results in 2 weeks, please contact this office.

## 2016-12-03 NOTE — Progress Notes (Addendum)
Procedure: verbal consent obtained.  Procedure site alcohol swabbed.  1% lidocaine placed at the sebaceous cyst at the right upper back.  Swabbed with povidine.  11blade utilized to place 1cm incision.  Generous purulent fluid expressed.  Searched for loculations with more fluid.  1cm depth of cavity.  Irrigated with normal saline.  1/4 packing placed aggressively.  Dressings placed.

## 2016-12-03 NOTE — Progress Notes (Signed)
    MRN: [id number] DOB: 1931/08/29  Subjective:   ELLIAN Gomez is a 82 y.o. male presenting for follow up on sebaceous cystectomy, I&D. Patient has changed dressings daily. Denies fever, redness, pain, swelling. Jerry Gomez is still taking his antibiotic course. Feels that the wound has done very well.   Jerry Gomez has a current medication list which includes the following prescription(s): aspirin, atorvastatin, cephalexin, fluorouracil, furosemide, metoprolol succinate, multivitamin, and multivitamin. Also has No Known Allergies.  Jerry Gomez  has a past medical history of Cancer ([place]); Cataract; COLONIC POLYPS (03/02/2009); EYE SURGERY, HX OF (09/05/2007); HEMORRHOIDS, HX OF (09/05/2007); HYPERLIPIDEMIA (09/05/2007); HYPERTENSION (07/25/2007); OSTEOARTHRITIS (09/05/2007); and SLEEP APNEA, OBSTRUCTIVE (09/05/2007). Also  has a past surgical history that includes Eye surgery (10/22/13).  Objective:   Vitals: BP 122/72 (BP Location: Right Arm, Patient Position: Sitting, Cuff Size: Normal)   Pulse 71   Temp 97.6 F (36.4 C) (Oral)   Resp 17   Ht 5\' 6"  (1.676 m)   Wt 159 lb (72.1 kg)   SpO2 98%   BMI 25.66 kg/m   Physical Exam  Constitutional: Jerry Gomez is oriented to person, place, and time. Jerry Gomez appears well-developed and well-nourished.  Cardiovascular: Normal rate.   Pulmonary/Chest: Effort normal.  Neurological: Jerry Gomez is alert and oriented to person, place, and time.  Skin:       WOUND CARE - Dressing removed. Packing in place. There was no drainage expressed, no remnants of cyst sac. Cleansed and dressed.  Assessment and Plan :   1. Encounter for wound care 2. Infected sebaceous cyst - Healing very well. No additional packing needed. Anticipatory guidance provided. RTC only if there is no resolution of symptoms.  Jaynee Eagles, PA-C [facility] [phone] 12/03/2016 5:44 PM

## 2016-12-18 ENCOUNTER — Telehealth: Payer: Self-pay | Admitting: Internal Medicine

## 2016-12-18 ENCOUNTER — Other Ambulatory Visit (HOSPITAL_BASED_OUTPATIENT_CLINIC_OR_DEPARTMENT_OTHER): Payer: Medicare Other

## 2016-12-18 ENCOUNTER — Encounter: Payer: Self-pay | Admitting: Internal Medicine

## 2016-12-18 ENCOUNTER — Ambulatory Visit (HOSPITAL_BASED_OUTPATIENT_CLINIC_OR_DEPARTMENT_OTHER): Payer: Medicare Other | Admitting: Internal Medicine

## 2016-12-18 VITALS — BP 124/57 | HR 70 | Temp 97.6°F | Resp 18 | Ht 66.0 in | Wt 157.7 lb

## 2016-12-18 DIAGNOSIS — C9110 Chronic lymphocytic leukemia of B-cell type not having achieved remission: Secondary | ICD-10-CM | POA: Diagnosis not present

## 2016-12-18 LAB — COMPREHENSIVE METABOLIC PANEL
ALBUMIN: 4.2 g/dL (ref 3.5–5.0)
ALK PHOS: 84 U/L (ref 40–150)
ALT: 22 U/L (ref 0–55)
AST: 24 U/L (ref 5–34)
Anion Gap: 9 mEq/L (ref 3–11)
BUN: 14.9 mg/dL (ref 7.0–26.0)
CALCIUM: 9.4 mg/dL (ref 8.4–10.4)
CO2: 30 mEq/L — ABNORMAL HIGH (ref 22–29)
Chloride: 103 mEq/L (ref 98–109)
Creatinine: 1.1 mg/dL (ref 0.7–1.3)
EGFR: 64 mL/min/{1.73_m2} — ABNORMAL LOW (ref >90)
Glucose: 118 mg/dl (ref 70–140)
Potassium: 4.3 mEq/L (ref 3.5–5.1)
Sodium: 142 mEq/L (ref 136–145)
TOTAL PROTEIN: 6.5 g/dL (ref 6.4–8.3)
Total Bilirubin: 1.92 mg/dL — ABNORMAL HIGH (ref 0.20–1.20)

## 2016-12-18 LAB — LACTATE DEHYDROGENASE: LDH: 192 U/L (ref 125–245)

## 2016-12-18 LAB — CBC WITH DIFFERENTIAL/PLATELET
BASO%: 0.1 % (ref 0.0–2.0)
BASOS ABS: 0 10*3/uL (ref 0.0–0.1)
EOS%: 0.6 % (ref 0.0–7.0)
Eosinophils Absolute: 0.2 10*3/uL (ref 0.0–0.5)
HEMATOCRIT: 44.2 % (ref 38.4–49.9)
HEMOGLOBIN: 14.4 g/dL (ref 13.0–17.1)
LYMPH#: 30.7 10*3/uL — AB (ref 0.9–3.3)
LYMPH%: 89.5 % — ABNORMAL HIGH (ref 14.0–49.0)
MCH: 32.1 pg (ref 27.2–33.4)
MCHC: 32.6 g/dL (ref 32.0–36.0)
MCV: 98.5 fL — ABNORMAL HIGH (ref 79.3–98.0)
MONO#: 0.4 10*3/uL (ref 0.1–0.9)
MONO%: 1.2 % (ref 0.0–14.0)
NEUT%: 8.6 % — ABNORMAL LOW (ref 39.0–75.0)
NEUTROS ABS: 3 10*3/uL (ref 1.5–6.5)
Platelets: 139 10*3/uL — ABNORMAL LOW (ref 140–400)
RBC: 4.49 10*6/uL (ref 4.20–5.82)
RDW: 13.4 % (ref 11.0–14.6)
WBC: 34.3 10*3/uL — AB (ref 4.0–10.3)

## 2016-12-18 LAB — TECHNOLOGIST REVIEW

## 2016-12-18 NOTE — Progress Notes (Signed)
[place] [phone]   Fax:([phone]  OFFICE PROGRESS NOTE  Walker Kehr, MD [place] [address]  DIAGNOSIS: Stage 0 Chronic lymphocytic leukemia   PRIOR THERAPY: None   CURRENT THERAPY: Observation  INTERVAL HISTORY: Jerry Gomez 83 y.o. male came to the clinic today for routine six-month follow-up visit. Jerry Gomez has no complaints today. He denied having any significant chest pain, shortness of breath, cough or hemoptysis. He has no weight loss or night sweats. He has no nausea, vomiting, diarrhea or constipation. He has no palpable lymphadenopathy. He had repeat CBC, comprehensive metabolic panel and LDH performed earlier today and he is here for evaluation and discussion of his lab results.  MEDICAL HISTORY: Past Medical History:  Diagnosis Date  . Cancer ([place])   . Cataract   . COLONIC POLYPS 03/02/2009  . EYE SURGERY, HX OF 09/05/2007  . HEMORRHOIDS, HX OF 09/05/2007  . HYPERLIPIDEMIA 09/05/2007  . HYPERTENSION 07/25/2007  . OSTEOARTHRITIS 09/05/2007  . SLEEP APNEA, OBSTRUCTIVE 09/05/2007    ALLERGIES:  has No Known Allergies.  MEDICATIONS:  Current Outpatient Prescriptions  Medication Sig Dispense Refill  . aspirin 162 MG EC tablet Take 1 tablet (162 mg total) by mouth daily. 100 tablet 3  . atorvastatin (LIPITOR) 20 MG tablet TAKE 1 TABLET EVERY DAY 90 tablet 3  . cephALEXin (KEFLEX) 500 MG capsule Take 1 capsule (500 mg total) by mouth 2 (two) times daily. 20 capsule 0  . fluorouracil (EFUDEX) 5 % cream     . furosemide (LASIX) 40 MG tablet Take 0.5-1 tablets (20-40 mg total) by mouth daily. 90 tablet 3  . metoprolol succinate (TOPROL-XL) 50 MG 24 hr tablet Take 1 tablet (50 mg total) by mouth daily. Take with or immediately following a meal. 90 tablet 2  . Multiple Vitamin (MULTIVITAMIN) tablet Take 1 tablet by mouth daily.      . Multiple Vitamins-Minerals (MULTIVITAMIN) tablet Take 1 tablet by mouth daily.  100 tablet 3   No current facility-administered medications for this visit.     SURGICAL HISTORY:  Past Surgical History:  Procedure Laterality Date  . EYE SURGERY  10/22/13   left eye, cataract    REVIEW OF SYSTEMS:  A comprehensive review of systems was negative.   PHYSICAL EXAMINATION: General appearance: alert, cooperative and no distress Head: Normocephalic, without obvious abnormality, atraumatic Neck: no adenopathy Lymph nodes: Cervical, supraclavicular, and axillary nodes normal. Resp: clear to auscultation bilaterally Cardio: regular rate and rhythm, S1, S2 normal, no murmur, click, rub or gallop GI: soft, non-tender; bowel sounds normal; no masses,  no organomegaly Extremities: extremities normal, atraumatic, no cyanosis or edema  ECOG PERFORMANCE STATUS: 1 - Symptomatic but completely ambulatory  Blood pressure (!) 124/57, pulse 70, temperature 97.6 F (36.4 C), temperature source Oral, resp. rate 18, height 5\' 6"  (1.676 m), weight 157 lb 11.2 oz (71.5 kg), SpO2 100 %.  LABORATORY DATA: Lab Results  Component Value Date   WBC 34.3 (H) 12/18/2016   HGB 14.4 12/18/2016   HCT 44.2 12/18/2016   MCV 98.5 (H) 12/18/2016   PLT 139 (L) 12/18/2016      Chemistry      Component Value Date/Time   NA 141 10/09/2016 1049   NA 142 06/17/2016 1342   K 4.8 10/09/2016 1049   K 3.9 06/17/2016 1342   CL 104 10/09/2016 1049   CL 104 12/14/2012 1031   CO2 30 10/09/2016 1049  CO2 28 06/17/2016 1342   BUN 12 10/09/2016 1049   BUN 15.5 06/17/2016 1342   CREATININE 0.89 10/09/2016 1049   CREATININE 1.1 06/17/2016 1342      Component Value Date/Time   CALCIUM 9.4 10/09/2016 1049   CALCIUM 9.0 06/17/2016 1342   ALKPHOS 81 06/17/2016 1342   AST 24 06/17/2016 1342   ALT 21 06/17/2016 1342   BILITOT 1.67 (H) 06/17/2016 1342       RADIOGRAPHIC STUDIES: No results found.  ASSESSMENT AND PLAN:  This is a very pleasant 83 years old white male with a stage 0 chronic  lymphocytic leukemia. The patient is currently on observation. He is feeling fine and has no concerning complaints. CBC today showed no concerning increase in the total white blood count. I discussed the lab result with the patient today. I recommended for him to continue on observation with repeat CBC, comprehensive metabolic panel and LDH in 6 months. She was advised to call immediately if he has any concerning symptoms in the interval. All questions were answered. The patient knows to call the clinic with any problems, questions or concerns. We can certainly see the patient much sooner if necessary. I spent 10 minutes counseling the patient face to face. The total time spent in the appointment was 15 minutes.  Disclaimer: This note was dictated with voice recognition software. Similar sounding words can inadvertently be transcribed and may not be corrected upon review.

## 2016-12-18 NOTE — Telephone Encounter (Signed)
GAVE PATIENT AVS REPORT AND APPOINTMENTS FOR July.  °

## 2016-12-24 DIAGNOSIS — L570 Actinic keratosis: Secondary | ICD-10-CM | POA: Diagnosis not present

## 2016-12-24 DIAGNOSIS — L720 Epidermal cyst: Secondary | ICD-10-CM | POA: Diagnosis not present

## 2016-12-24 DIAGNOSIS — Z85828 Personal history of other malignant neoplasm of skin: Secondary | ICD-10-CM | POA: Diagnosis not present

## 2017-01-07 ENCOUNTER — Telehealth: Payer: Self-pay | Admitting: Internal Medicine

## 2017-01-07 DIAGNOSIS — Z961 Presence of intraocular lens: Secondary | ICD-10-CM | POA: Diagnosis not present

## 2017-01-07 DIAGNOSIS — H18411 Arcus senilis, right eye: Secondary | ICD-10-CM | POA: Diagnosis not present

## 2017-01-07 DIAGNOSIS — H18421 Band keratopathy, right eye: Secondary | ICD-10-CM | POA: Diagnosis not present

## 2017-01-07 DIAGNOSIS — H35373 Puckering of macula, bilateral: Secondary | ICD-10-CM | POA: Diagnosis not present

## 2017-01-07 NOTE — Telephone Encounter (Signed)
Needs refill on his Metoprolol succinate, 90  Day supply PLEASE NOTE patient has changed pharmacy, send to: [place] Phone [phone] Fax [phone]  Please change pharmacy to [facility] as he has changed his pharmacy benefits. Member ID [id number] Group # [id number]  Jerry Gomez [phone]

## 2017-01-07 NOTE — Telephone Encounter (Signed)
Jerry Gomez is checking on this

## 2017-01-17 ENCOUNTER — Other Ambulatory Visit: Payer: Self-pay

## 2017-01-17 MED ORDER — METOPROLOL SUCCINATE ER 50 MG PO TB24: 50.0000 mg | ORAL_TABLET | Freq: Every day | ORAL | 2 refills | Status: DC

## 2017-01-17 NOTE — Telephone Encounter (Signed)
Is this completed?

## 2017-01-17 NOTE — Telephone Encounter (Signed)
[facility] to [facility] per patient request

## 2017-02-25 ENCOUNTER — Other Ambulatory Visit: Payer: Self-pay | Admitting: *Deleted

## 2017-02-25 MED ORDER — ATORVASTATIN CALCIUM 20 MG PO TABS
20.0000 mg | ORAL_TABLET | Freq: Every day | ORAL | 0 refills | Status: DC
Start: 2017-02-25 — End: 2017-06-17

## 2017-02-25 NOTE — Telephone Encounter (Signed)
Pt walk-in needing to get rx on his atorvastatin [facility] to [facility]. Notified pt rx has been sent...Jerry Gomez

## 2017-03-11 DIAGNOSIS — D0462 Carcinoma in situ of skin of left upper limb, including shoulder: Secondary | ICD-10-CM | POA: Diagnosis not present

## 2017-03-11 DIAGNOSIS — L570 Actinic keratosis: Secondary | ICD-10-CM | POA: Diagnosis not present

## 2017-03-11 DIAGNOSIS — Z85828 Personal history of other malignant neoplasm of skin: Secondary | ICD-10-CM | POA: Diagnosis not present

## 2017-03-11 DIAGNOSIS — D485 Neoplasm of uncertain behavior of skin: Secondary | ICD-10-CM | POA: Diagnosis not present

## 2017-03-26 DIAGNOSIS — H18421 Band keratopathy, right eye: Secondary | ICD-10-CM | POA: Diagnosis not present

## 2017-03-26 DIAGNOSIS — Z961 Presence of intraocular lens: Secondary | ICD-10-CM | POA: Diagnosis not present

## 2017-03-31 ENCOUNTER — Telehealth: Payer: Self-pay | Admitting: Internal Medicine

## 2017-03-31 NOTE — Telephone Encounter (Signed)
Called Jerry Gomez to see if he can be scheduled on health coach's schedule for AWV. Pt has Medicare Part A & B and is eligible to receive AWV this year.

## 2017-04-09 NOTE — Progress Notes (Signed)
Pre visit review using our clinic review tool, if applicable. No additional management support is needed unless otherwise documented below in the visit note. 

## 2017-04-09 NOTE — Progress Notes (Addendum)
Subjective:   Jerry Gomez is a 83 y.o. male who presents for Medicare Annual/Subsequent preventive examination.  Review of Systems:  No ROS.  Medicare Wellness Visit.    Sleep patterns: feels rested on waking, gets up 1-2 times nightly to void and sleeps 6-8 hours nightly.   Home Safety/Smoke Alarms: Feels safe in home. Smoke alarms in place.    Living environment; residence and Firearm Safety: split level / walkout, no firearms.Lives with wife, no DME needed at this time Seat Belt Safety/Bike Helmet: Wears seat belt.   Counseling:   Eye Exam- appointment yearly  Dental- appointment every 6 months  Male:   CCS- N/A due to age   PSA-  Lab Results  Component Value Date   PSA 8.47 (H) 10/09/2016   PSA 6.57 (H) 04/08/2016   PSA 7.66 (H) 10/10/2015       Objective:    Vitals: BP 112/68   Pulse (!) 52   Resp 18   Ht 5\' 6"  (1.676 m)   Wt 163 lb (73.9 kg)   SpO2 99%   BMI 26.31 kg/m   Body mass index is 26.31 kg/m.  Tobacco History  Smoking Status  . Former Smoker  . Types: Cigarettes  . Quit date: 02/07/1963  Smokeless Tobacco  . Never Used     Counseling given: Not Answered   Past Medical History:  Diagnosis Date  . Cancer ([place])   . Cataract   . COLONIC POLYPS 03/02/2009  . EYE SURGERY, HX OF 09/05/2007  . HEMORRHOIDS, HX OF 09/05/2007  . HYPERLIPIDEMIA 09/05/2007  . HYPERTENSION 07/25/2007  . OSTEOARTHRITIS 09/05/2007  . SLEEP APNEA, OBSTRUCTIVE 09/05/2007   Past Surgical History:  Procedure Laterality Date  . EYE SURGERY  10/22/13   left eye, cataract   Family History  Problem Relation Age of Onset  . Kidney disease Father   . Cancer Father     prostate-late on-set  . Cancer Brother     non-hodgkins lymphoma  . Colon cancer Neg Hx   . Esophageal cancer Neg Hx   . Rectal cancer Neg Hx   . Stomach cancer Neg Hx    History  Sexual Activity  . Sexual activity: Yes  . Partners: Female    Outpatient Encounter Prescriptions as of 04/10/2017    Medication Sig  . Artificial Tear Ointment (ARTIFICIAL TEARS) ointment Place into both eyes nightly.  [name] aspirin 162 MG EC tablet Take 1 tablet (162 mg total) by mouth daily.  [name] atorvastatin (LIPITOR) 20 MG tablet Take 1 tablet (20 mg total) by mouth daily. Yearly physical w/labs due in may must see MD for refills  . furosemide (LASIX) 40 MG tablet Take 0.5-1 tablets (20-40 mg total) by mouth daily.  . metoprolol succinate (TOPROL-XL) 50 MG 24 hr tablet Take 1 tablet (50 mg total) by mouth daily. Take with or immediately following a meal.  . Multiple Vitamin (MULTIVITAMIN) tablet Take 1 tablet by mouth daily.    . [DISCONTINUED] cephALEXin (KEFLEX) 500 MG capsule Take 1 capsule (500 mg total) by mouth 2 (two) times daily. (Patient not taking: Reported on 04/10/2017)  . [DISCONTINUED] fluorouracil (EFUDEX) 5 % cream   . [DISCONTINUED] Multiple Vitamins-Minerals (MULTIVITAMIN) tablet Take 1 tablet by mouth daily.   No facility-administered encounter medications on file as of 04/10/2017.     Activities of Daily Living In your present state of health, do you have any difficulty performing the following activities: 04/10/2017  Hearing? N  Vision? N  Difficulty concentrating or making decisions? N  Walking or climbing stairs? N  Dressing or bathing? N  Doing errands, shopping? N  Preparing Food and eating ? N  Using the Toilet? N  In the past six months, have you accidently leaked urine? N  Do you have problems with loss of bowel control? N  Managing your Medications? N  Managing your Finances? N  Housekeeping or managing your Housekeeping? N  Some recent data might be hidden    Patient Care Team: Cassandria Anger, MD as PCP - General (Internal Medicine) Griselda Miner, MD (Dermatology) Curt Bears, MD (Hematology and Oncology)   Assessment:    Physical assessment deferred to PCP.  Exercise Activities and Dietary recommendations Current Exercise Habits: Home exercise routine,  Type of exercise: Other - see comments (plays golf and does yard work), Time (Minutes): 55, Frequency (Times/Week): 4, Weekly Exercise (Minutes/Week): 220, Intensity: Mild (mild to moderate), Exercise limited by: None identified  Diet (meal preparation, eat out, water intake, caffeinated beverages, dairy products, fruits and vegetables): in general, a "healthy" diet  , well balanced, low fat/ cholesterol, low salt eats a variety of fruits and vegetables daily, limits salt, fat/cholesterol, 1 cup coffee daily, 3-4 glasses of tea or lemonade daily, drinks 2-3 glasses of water daily.  Encouraged patient to increase daily water intake.      Goals    . Maintain current health status          Continue to be active,  eat healthy, enjoy family and life.      Fall Risk Fall Risk  04/10/2017 12/03/2016 11/30/2016 11/29/2016 04/08/2016  Falls in the past year? No No No No No   Depression Screen PHQ 2/9 Scores 04/10/2017 12/03/2016 11/30/2016 11/29/2016  PHQ - 2 Score 0 0 0 0  PHQ- 9 Score 2 - - -  Exception Documentation - - - -    Cognitive Function       Ad8 score reviewed for issues:  Issues making decisions: no  Less interest in hobbies / activities: no  Repeats questions, stories (family complaining): no  Trouble using ordinary gadgets (microwave, computer, phone): no  Forgets the month or year: no  Mismanaging finances: no  Remembering appts: no Daily problems with thinking and/or memory: no Ad8 score is= 0  Immunization History  Administered Date(s) Administered  . DTP 09/08/1998  . Influenza Split 08/28/2011, 09/08/2012  . Influenza Whole 09/07/2008, 09/02/2009, 09/06/2010  . Influenza, High Dose Seasonal PF 08/29/2016  . Influenza,inj,Quad PF,36+ Mos 08/04/2013, 09/08/2014, 09/22/2015  . Pneumococcal Conjugate-13 10/10/2014  . Pneumococcal Polysaccharide-23 09/08/2001, 01/15/2010  . Td 01/15/2010  . Zoster 05/05/2011   Screening Tests Health Maintenance  Topic  Date Due  . INFLUENZA VACCINE  07/09/2017  . TETANUS/TDAP  01/16/2020  . PNA vac Low Risk Adult  Completed      Plan:    Continue to eat heart healthy diet (full of fruits, vegetables, whole grains, lean protein, water--limit salt, fat, and sugar intake) and increase physical activity as tolerated.  Continue doing brain stimulating activities (puzzles, reading, adult coloring books, staying active) to keep memory sharp.   I have personally reviewed and noted the following in the patient's chart:   . Medical and social history . Use of alcohol, tobacco or illicit drugs  . Current medications and supplements . Functional ability and status . Nutritional status . Physical activity . Advanced directives . List of other physicians . Hospitalizations, surgeries, and ER visits in previous 70  months . Vitals . Screenings to include cognitive, depression, and falls . Referrals and appointments  In addition, I have reviewed and discussed with patient certain preventive protocols, quality metrics, and best practice recommendations. A written personalized care plan for preventive services as well as general preventive health recommendations were provided to patient.     Michiel Cowboy, RN  04/10/2017  Medical screening examination/treatment/procedure(s) were performed by non-physician practitioner and as supervising physician I was immediately available for consultation/collaboration. I agree with above. Walker Kehr, MD

## 2017-04-10 ENCOUNTER — Ambulatory Visit (INDEPENDENT_AMBULATORY_CARE_PROVIDER_SITE_OTHER): Payer: Medicare Other | Admitting: *Deleted

## 2017-04-10 VITALS — BP 112/68 | HR 52 | Resp 18 | Ht 66.0 in | Wt 163.0 lb

## 2017-04-10 DIAGNOSIS — Z0000 Encounter for general adult medical examination without abnormal findings: Secondary | ICD-10-CM | POA: Diagnosis not present

## 2017-04-10 NOTE — Patient Instructions (Signed)
Continue to eat heart healthy diet (full of fruits, vegetables, whole grains, lean protein, water--limit salt, fat, and sugar intake) and increase physical activity as tolerated.  Continue doing brain stimulating activities (puzzles, reading, adult coloring books, staying active) to keep memory sharp.    Jerry Gomez , Thank you for taking time to come for your Medicare Wellness Visit. I appreciate your ongoing commitment to your health goals. Please review the following plan we discussed and let me know if I can assist you in the future.   These are the goals we discussed: Goals    . Maintain current health status          Continue to be active,  eat healthy, enjoy family and life.       This is a list of the screening recommended for you and due dates:  Health Maintenance  Topic Date Due  . Flu Shot  07/09/2017  . Tetanus Vaccine  01/16/2020  . Pneumonia vaccines  Completed

## 2017-04-22 ENCOUNTER — Other Ambulatory Visit (INDEPENDENT_AMBULATORY_CARE_PROVIDER_SITE_OTHER): Payer: Medicare Other

## 2017-04-22 ENCOUNTER — Telehealth: Payer: Self-pay

## 2017-04-22 ENCOUNTER — Ambulatory Visit (INDEPENDENT_AMBULATORY_CARE_PROVIDER_SITE_OTHER): Payer: Medicare Other | Admitting: Internal Medicine

## 2017-04-22 ENCOUNTER — Encounter: Payer: Self-pay | Admitting: Internal Medicine

## 2017-04-22 DIAGNOSIS — I10 Essential (primary) hypertension: Secondary | ICD-10-CM

## 2017-04-22 DIAGNOSIS — C9110 Chronic lymphocytic leukemia of B-cell type not having achieved remission: Secondary | ICD-10-CM

## 2017-04-22 DIAGNOSIS — C9190 Lymphoid leukemia, unspecified not having achieved remission: Secondary | ICD-10-CM

## 2017-04-22 DIAGNOSIS — E785 Hyperlipidemia, unspecified: Secondary | ICD-10-CM

## 2017-04-22 LAB — HEPATIC FUNCTION PANEL
ALBUMIN: 4.5 g/dL (ref 3.5–5.2)
ALK PHOS: 66 U/L (ref 39–117)
ALT: 21 U/L (ref 0–53)
AST: 22 U/L (ref 0–37)
Bilirubin, Direct: 0.3 mg/dL (ref 0.0–0.3)
TOTAL PROTEIN: 6.5 g/dL (ref 6.0–8.3)
Total Bilirubin: 1.3 mg/dL — ABNORMAL HIGH (ref 0.2–1.2)

## 2017-04-22 LAB — BASIC METABOLIC PANEL
BUN: 19 mg/dL (ref 6–23)
CHLORIDE: 104 meq/L (ref 96–112)
CO2: 33 mEq/L — ABNORMAL HIGH (ref 19–32)
CREATININE: 0.95 mg/dL (ref 0.40–1.50)
Calcium: 9.3 mg/dL (ref 8.4–10.5)
GFR: 80.32 mL/min (ref >60.00)
GLUCOSE: 77 mg/dL (ref 70–99)
Potassium: 4.5 mEq/L (ref 3.5–5.1)
Sodium: 140 mEq/L (ref 135–145)

## 2017-04-22 LAB — CBC WITH DIFFERENTIAL/PLATELET
BASOS PCT: 0.1 % (ref 0.0–3.0)
Basophils Absolute: 0 10*3/uL (ref 0.0–0.1)
EOS ABS: 0.1 10*3/uL (ref 0.0–0.7)
EOS PCT: 0.3 % (ref 0.0–5.0)
HEMATOCRIT: 41.9 % (ref 39.0–52.0)
HEMOGLOBIN: 14 g/dL (ref 13.0–17.0)
LYMPHS PCT: 90.2 % — AB (ref 12.0–46.0)
Lymphs Abs: 28.1 10*3/uL — ABNORMAL HIGH (ref 0.7–4.0)
MCHC: 33.4 g/dL (ref 30.0–36.0)
MCV: 97.2 fl (ref 78.0–100.0)
MONOS PCT: 1.1 % — AB (ref 3.0–12.0)
Monocytes Absolute: 0.3 10*3/uL (ref 0.1–1.0)
Neutro Abs: 2.6 10*3/uL (ref 1.4–7.7)
Neutrophils Relative %: 8.3 % — ABNORMAL LOW (ref 43.0–77.0)
Platelets: 139 10*3/uL — ABNORMAL LOW (ref 150.0–400.0)
RBC: 4.31 Mil/uL (ref 4.22–5.81)
RDW: 13.4 % (ref 11.5–15.5)

## 2017-04-22 MED ORDER — FUROSEMIDE 20 MG PO TABS
20.0000 mg | ORAL_TABLET | Freq: Every day | ORAL | 3 refills | Status: DC
Start: 2017-04-22 — End: 2018-05-25

## 2017-04-22 MED ORDER — ZOSTER VAC RECOMB ADJUVANTED 50 MCG/0.5ML IM SUSR: 0.5000 mL | Freq: Once | INTRAMUSCULAR | 1 refills | Status: AC

## 2017-04-22 NOTE — Patient Instructions (Signed)
[facility] Well w/Jill 

## 2017-04-22 NOTE — Assessment & Plan Note (Signed)
Lipitor 

## 2017-04-22 NOTE — Assessment & Plan Note (Signed)
Labs

## 2017-04-22 NOTE — Telephone Encounter (Signed)
CRITICAL VALUE STICKER  CRITICAL VALUE: WBC 31.1  RECEIVER (on-site recipient of call): Peyton Najjar  DATE & TIME NOTIFIED: 04/22/17 11:08  MESSENGER (representative from lab): Santiago Glad  MD NOTIFIED: [name]  TIME OF NOTIFICATION: 04/22/17 11:10  RESPONSE: Okay, pt has Chronic Leukemia

## 2017-04-22 NOTE — Progress Notes (Signed)
Subjective:  Patient ID: Jerry Gomez, male    DOB: 28-Dec-1930  Age: 83 y.o. MRN: [id number]  CC: No chief complaint on file.   HPI Jerry Gomez presents for dyslipidemia, CLL, HTN f/u. Pt is asking about Shingrix - discussed  Outpatient Medications Prior to Visit  Medication Sig Dispense Refill  . Artificial Tear Ointment (ARTIFICIAL TEARS) ointment Place into both eyes nightly.    [name] aspirin 162 MG EC tablet Take 1 tablet (162 mg total) by mouth daily. 100 tablet 3  . atorvastatin (LIPITOR) 20 MG tablet Take 1 tablet (20 mg total) by mouth daily. Yearly physical w/labs due in may must see MD for refills 90 tablet 0  . furosemide (LASIX) 40 MG tablet Take 0.5-1 tablets (20-40 mg total) by mouth daily. 90 tablet 3  . metoprolol succinate (TOPROL-XL) 50 MG 24 hr tablet Take 1 tablet (50 mg total) by mouth daily. Take with or immediately following a meal. 90 tablet 2  . Multiple Vitamin (MULTIVITAMIN) tablet Take 1 tablet by mouth daily.       No facility-administered medications prior to visit.     ROS Review of Systems  Constitutional: Negative for appetite change, fatigue and unexpected weight change.  HENT: Negative for congestion, nosebleeds, sneezing, sore throat and trouble swallowing.   Eyes: Negative for itching and visual disturbance.  Respiratory: Negative for cough.   Cardiovascular: Negative for chest pain, palpitations and leg swelling.  Gastrointestinal: Negative for abdominal distention, blood in stool, diarrhea and nausea.  Genitourinary: Negative for frequency and hematuria.  Musculoskeletal: Negative for back pain, gait problem, joint swelling and neck pain.  Skin: Negative for rash.  Neurological: Negative for dizziness, tremors, speech difficulty and weakness.  Psychiatric/Behavioral: Negative for agitation, dysphoric mood and sleep disturbance. The patient is not nervous/anxious.     Objective:  BP 126/64 (BP Location: Left Arm, Patient Position: Sitting,  Cuff Size: Normal)   Pulse (!) 55   Temp 97.9 F (36.6 C) (Oral)   Ht 5\' 6"  (1.676 m)   Wt 163 lb 1.3 oz (74 kg)   SpO2 100%   BMI 26.32 kg/m   BP Readings from Last 3 Encounters:  04/22/17 126/64  04/10/17 112/68  12/18/16 (!) 124/57    Wt Readings from Last 3 Encounters:  04/22/17 163 lb 1.3 oz (74 kg)  04/10/17 163 lb (73.9 kg)  12/18/16 157 lb 11.2 oz (71.5 kg)    Physical Exam  Constitutional: He is oriented to person, place, and time. He appears well-developed. No distress.  NAD  HENT:  Mouth/Throat: Oropharynx is clear and moist.  Eyes: Conjunctivae are normal. Pupils are equal, round, and reactive to light.  Neck: Normal range of motion. No JVD present. No thyromegaly present.  Cardiovascular: Normal rate, regular rhythm, normal heart sounds and intact distal pulses.  Exam reveals no gallop and no friction rub.   No murmur heard. Pulmonary/Chest: Effort normal and breath sounds normal. No respiratory distress. He has no wheezes. He has no rales. He exhibits no tenderness.  Abdominal: Soft. Bowel sounds are normal. He exhibits no distension and no mass. There is no tenderness. There is no rebound and no guarding.  Musculoskeletal: Normal range of motion. He exhibits no edema or tenderness.  Lymphadenopathy:    He has no cervical adenopathy.  Neurological: He is alert and oriented to person, place, and time. He has normal reflexes. No cranial nerve deficit. He exhibits normal muscle tone. He displays a negative Romberg sign.  Coordination and gait normal.  Skin: Skin is warm and dry. No rash noted.  Psychiatric: He has a normal mood and affect. His behavior is normal. Judgment and thought content normal.  bruises on arms Trace edema L>R  Lab Results  Component Value Date   WBC 34.3 (H) 12/18/2016   HGB 14.4 12/18/2016   HCT 44.2 12/18/2016   PLT 139 (L) 12/18/2016   GLUCOSE 118 12/18/2016   CHOL 127 04/08/2016   TRIG 67.0 04/08/2016   HDL 52.10 04/08/2016    LDLCALC 62 04/08/2016   ALT 22 12/18/2016   AST 24 12/18/2016   NA 142 12/18/2016   K 4.3 12/18/2016   CL 104 10/09/2016   CREATININE 1.1 12/18/2016   BUN 14.9 12/18/2016   CO2 30 (H) 12/18/2016   TSH 1.12 04/08/2016   PSA 8.47 (H) 10/09/2016    No results found.  Assessment & Plan:   There are no diagnoses linked to this encounter. I am having Mr. Demuro maintain his multivitamin, furosemide, aspirin, metoprolol succinate, atorvastatin, and artificial tears.  No orders of the defined types were placed in this encounter.    Follow-up: No Follow-up on file.  Walker Kehr, MD

## 2017-04-22 NOTE — Assessment & Plan Note (Signed)
Toprol Furosemide 20 mg/d

## 2017-04-23 DIAGNOSIS — Z85828 Personal history of other malignant neoplasm of skin: Secondary | ICD-10-CM | POA: Diagnosis not present

## 2017-04-23 DIAGNOSIS — D692 Other nonthrombocytopenic purpura: Secondary | ICD-10-CM | POA: Diagnosis not present

## 2017-04-23 DIAGNOSIS — L814 Other melanin hyperpigmentation: Secondary | ICD-10-CM | POA: Diagnosis not present

## 2017-04-23 DIAGNOSIS — L570 Actinic keratosis: Secondary | ICD-10-CM | POA: Diagnosis not present

## 2017-04-23 DIAGNOSIS — L821 Other seborrheic keratosis: Secondary | ICD-10-CM | POA: Diagnosis not present

## 2017-04-23 DIAGNOSIS — L720 Epidermal cyst: Secondary | ICD-10-CM | POA: Diagnosis not present

## 2017-04-23 DIAGNOSIS — D2272 Melanocytic nevi of left lower limb, including hip: Secondary | ICD-10-CM | POA: Diagnosis not present

## 2017-04-23 DIAGNOSIS — D1801 Hemangioma of skin and subcutaneous tissue: Secondary | ICD-10-CM | POA: Diagnosis not present

## 2017-06-17 ENCOUNTER — Encounter: Payer: Self-pay | Admitting: Internal Medicine

## 2017-06-17 ENCOUNTER — Telehealth: Payer: Self-pay | Admitting: Internal Medicine

## 2017-06-17 ENCOUNTER — Ambulatory Visit (HOSPITAL_BASED_OUTPATIENT_CLINIC_OR_DEPARTMENT_OTHER): Payer: Medicare Other | Admitting: Internal Medicine

## 2017-06-17 ENCOUNTER — Other Ambulatory Visit (HOSPITAL_BASED_OUTPATIENT_CLINIC_OR_DEPARTMENT_OTHER): Payer: Medicare Other

## 2017-06-17 VITALS — BP 123/52 | HR 61 | Temp 97.9°F | Resp 17 | Ht 66.0 in | Wt 161.5 lb

## 2017-06-17 DIAGNOSIS — C9110 Chronic lymphocytic leukemia of B-cell type not having achieved remission: Secondary | ICD-10-CM

## 2017-06-17 LAB — CBC WITH DIFFERENTIAL/PLATELET
BASO%: 0.3 % (ref 0.0–2.0)
BASOS ABS: 0.1 10*3/uL (ref 0.0–0.1)
EOS ABS: 0.1 10*3/uL (ref 0.0–0.5)
EOS%: 0.2 % (ref 0.0–7.0)
HEMATOCRIT: 44.6 % (ref 38.4–49.9)
HEMOGLOBIN: 14.7 g/dL (ref 13.0–17.1)
LYMPH%: 89.3 % — ABNORMAL HIGH (ref 14.0–49.0)
MCH: 32.3 pg (ref 27.2–33.4)
MCHC: 33 g/dL (ref 32.0–36.0)
MCV: 97.8 fL (ref 79.3–98.0)
MONO#: 0.6 10*3/uL (ref 0.1–0.9)
MONO%: 1.5 % (ref 0.0–14.0)
NEUT#: 3.3 10*3/uL (ref 1.5–6.5)
NEUT%: 8.7 % — ABNORMAL LOW (ref 39.0–75.0)
PLATELETS: 122 10*3/uL — AB (ref 140–400)
RBC: 4.56 10*6/uL (ref 4.20–5.82)
RDW: 13.2 % (ref 11.0–14.6)
WBC: 38.6 10*3/uL — ABNORMAL HIGH (ref 4.0–10.3)
lymph#: 34.5 10*3/uL — ABNORMAL HIGH (ref 0.9–3.3)

## 2017-06-17 LAB — COMPREHENSIVE METABOLIC PANEL
ALT: 27 U/L (ref 0–55)
ANION GAP: 7 meq/L (ref 3–11)
AST: 29 U/L (ref 5–34)
Albumin: 4.4 g/dL (ref 3.5–5.0)
Alkaline Phosphatase: 75 U/L (ref 40–150)
BUN: 16.4 mg/dL (ref 7.0–26.0)
CALCIUM: 9.8 mg/dL (ref 8.4–10.4)
CO2: 30 mEq/L — ABNORMAL HIGH (ref 22–29)
Chloride: 104 mEq/L (ref 98–109)
Creatinine: 1.1 mg/dL (ref 0.7–1.3)
EGFR: 63 mL/min/{1.73_m2} — AB (ref >90)
Glucose: 100 mg/dl (ref 70–140)
POTASSIUM: 4.5 meq/L (ref 3.5–5.1)
SODIUM: 141 meq/L (ref 136–145)
Total Bilirubin: 2.19 mg/dL — ABNORMAL HIGH (ref 0.20–1.20)
Total Protein: 6.8 g/dL (ref 6.4–8.3)

## 2017-06-17 LAB — LACTATE DEHYDROGENASE: LDH: 207 U/L (ref 125–245)

## 2017-06-17 LAB — TECHNOLOGIST REVIEW

## 2017-06-17 MED ORDER — ATORVASTATIN CALCIUM 20 MG PO TABS: 20.0000 mg | ORAL_TABLET | Freq: Every day | ORAL | 1 refills | Status: DC

## 2017-06-17 NOTE — Progress Notes (Signed)
[facility] [phone]   [phone]  OFFICE PROGRESS NOTE  [name], [name], MD [place] [address]  DIAGNOSIS: Stage 0 Chronic lymphocytic leukemia   PRIOR THERAPY: None   CURRENT THERAPY: Observation  INTERVAL HISTORY: Jerry Gomez 84 y.o. male returns to the clinic today for six-month follow-up visit. The patient is feeling fine today with no specific complaints. He denied having any chest pain, shortness breath, cough or hemoptysis. He denied having any weight loss or night sweats. He has no nausea, vomiting, diarrhea or constipation. He has few bruises and ecchymosis but no significant bleeding. Day for evaluation and repeat blood work.  MEDICAL HISTORY: Past Medical History:  Diagnosis Date  . Cancer ([place])   . Cataract   . COLONIC POLYPS 03/02/2009  . EYE SURGERY, HX OF 09/05/2007  . HEMORRHOIDS, HX OF 09/05/2007  . HYPERLIPIDEMIA 09/05/2007  . HYPERTENSION 07/25/2007  . OSTEOARTHRITIS 09/05/2007  . SLEEP APNEA, OBSTRUCTIVE 09/05/2007    ALLERGIES:  has No Known Allergies.  MEDICATIONS:  Current Outpatient Prescriptions  Medication Sig Dispense Refill  . Artificial Tear Ointment (ARTIFICIAL TEARS) ointment Place into both eyes nightly.    [name] aspirin 162 MG EC tablet Take 1 tablet (162 mg total) by mouth daily. 100 tablet 3  . atorvastatin (LIPITOR) 20 MG tablet Take 1 tablet (20 mg total) by mouth daily. Yearly physical w/labs due in may must see MD for refills 90 tablet 0  . furosemide (LASIX) 20 MG tablet Take 1 tablet (20 mg total) by mouth daily. 90 tablet 3  . metoprolol succinate (TOPROL-XL) 50 MG 24 hr tablet Take 1 tablet (50 mg total) by mouth daily. Take with or immediately following a meal. 90 tablet 2  . Multiple Vitamin (MULTIVITAMIN) tablet Take 1 tablet by mouth daily.       No current facility-administered medications for this visit.     SURGICAL HISTORY:  Past Surgical History:  Procedure  Laterality Date  . EYE SURGERY  10/22/13   left eye, cataract    REVIEW OF SYSTEMS:  A comprehensive review of systems was negative.   PHYSICAL EXAMINATION: General appearance: alert, cooperative and no distress Head: Normocephalic, without obvious abnormality, atraumatic Neck: no adenopathy Lymph nodes: Cervical, supraclavicular, and axillary nodes normal. Resp: clear to auscultation bilaterally Back: symmetric, no curvature. ROM normal. No CVA tenderness. Cardio: regular rate and rhythm, S1, S2 normal, no murmur, click, rub or gallop GI: soft, non-tender; bowel sounds normal; no masses,  no organomegaly Extremities: extremities normal, atraumatic, no cyanosis or edema  ECOG PERFORMANCE STATUS: 1 - Symptomatic but completely ambulatory  Blood pressure (!) 123/52, pulse 61, temperature 97.9 F (36.6 C), temperature source Oral, resp. rate 17, height 5\' 6"  (1.676 m), weight 161 lb 8 oz (73.3 kg), SpO2 99 %.  LABORATORY DATA: Lab Results  Component Value Date   WBC 38.6 (H) 06/17/2017   HGB 14.7 06/17/2017   HCT 44.6 06/17/2017   MCV 97.8 06/17/2017   PLT 122 (L) 06/17/2017      Chemistry      Component Value Date/Time   NA 141 06/17/2017 1259   K 4.5 06/17/2017 1259   CL 104 04/22/2017 0948   CL 104 12/14/2012 1031   CO2 30 (H) 06/17/2017 1259   BUN 16.4 06/17/2017 1259   CREATININE 1.1 06/17/2017 1259      Component Value Date/Time   CALCIUM 9.8 06/17/2017 1259   ALKPHOS 75  06/17/2017 1259   AST 29 06/17/2017 1259   ALT 27 06/17/2017 1259   BILITOT 2.19 (H) 06/17/2017 1259       RADIOGRAPHIC STUDIES: No results found.  ASSESSMENT AND PLAN:  This is a very pleasant 83 years old white male with a stage 0 chronic lymphocytic leukemia. The patient is currently on observation. CBC today showed mild increase in his total white blood count. The patient is currently asymptomatic and no concerning findings for significant disease progression to recommend treatment at  this point. I recommended for the patient to continue on observation with repeat CBC, comprehensive metabolic panel and LDH in 6 months. He was advised to call me immediately if he has any concerning symptoms in the interval. All questions were answered. The patient knows to call the clinic with any problems, questions or concerns. We can certainly see the patient much sooner if necessary. I spent 10 minutes counseling the patient face to face. The total time spent in the appointment was 15 minutes.  Disclaimer: This note was dictated with voice recognition software. Similar sounding words can inadvertently be transcribed and may not be corrected upon review.

## 2017-06-17 NOTE — Telephone Encounter (Signed)
Patient is requesting refill on atorvastatin.  Requesting to [facility] to [facility].

## 2017-06-17 NOTE — Telephone Encounter (Signed)
[facility]...Jerry Gomez

## 2017-09-09 ENCOUNTER — Ambulatory Visit (INDEPENDENT_AMBULATORY_CARE_PROVIDER_SITE_OTHER): Payer: Medicare Other

## 2017-09-09 DIAGNOSIS — Z23 Encounter for immunization: Secondary | ICD-10-CM

## 2017-10-06 ENCOUNTER — Encounter: Payer: Self-pay | Admitting: Family Medicine

## 2017-10-06 ENCOUNTER — Ambulatory Visit (INDEPENDENT_AMBULATORY_CARE_PROVIDER_SITE_OTHER): Payer: Medicare Other | Admitting: Family Medicine

## 2017-10-06 DIAGNOSIS — I10 Essential (primary) hypertension: Secondary | ICD-10-CM | POA: Diagnosis not present

## 2017-10-06 NOTE — Patient Instructions (Addendum)
Thank you for coming in,   Your blood pressure looks good today and can continue to watch it for now.    Please feel free to call with any questions or concerns at any time, at [phone]. --Dr. Raeford Razor

## 2017-10-06 NOTE — Assessment & Plan Note (Signed)
Systolic blood pressure has been somewhat elevated recently. Has historically had normal blood pressure. Denies any complaints today. - Continue current medications - Could consider switching from metoprolol due to sinus bradycardia if warranted.
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
## 2017-10-17 ENCOUNTER — Telehealth: Payer: Self-pay | Admitting: Internal Medicine

## 2017-10-17 NOTE — Telephone Encounter (Signed)
metoprolol succinate (TOPROL-XL) 50 MG 24 hr tablet  [facility] - [place], [place] [phone] (Phone) [phone] (Fax)   Patient is requesting a refill for a 90day supply.

## 2017-10-21 MED ORDER — METOPROLOL SUCCINATE ER 50 MG PO TB24: 50.0000 mg | ORAL_TABLET | Freq: Every day | ORAL | 2 refills | Status: DC

## 2017-10-21 NOTE — Telephone Encounter (Signed)
RX sent

## 2017-10-27 ENCOUNTER — Ambulatory Visit (INDEPENDENT_AMBULATORY_CARE_PROVIDER_SITE_OTHER): Payer: Medicare Other | Admitting: Internal Medicine

## 2017-10-27 ENCOUNTER — Other Ambulatory Visit (INDEPENDENT_AMBULATORY_CARE_PROVIDER_SITE_OTHER): Payer: Medicare Other

## 2017-10-27 ENCOUNTER — Encounter: Payer: Self-pay | Admitting: Internal Medicine

## 2017-10-27 DIAGNOSIS — I10 Essential (primary) hypertension: Secondary | ICD-10-CM | POA: Diagnosis not present

## 2017-10-27 DIAGNOSIS — C9190 Lymphoid leukemia, unspecified not having achieved remission: Secondary | ICD-10-CM | POA: Diagnosis not present

## 2017-10-27 DIAGNOSIS — E785 Hyperlipidemia, unspecified: Secondary | ICD-10-CM

## 2017-10-27 DIAGNOSIS — R972 Elevated prostate specific antigen [PSA]: Secondary | ICD-10-CM

## 2017-10-27 DIAGNOSIS — C9110 Chronic lymphocytic leukemia of B-cell type not having achieved remission: Secondary | ICD-10-CM

## 2017-10-27 LAB — BASIC METABOLIC PANEL
BUN: 13 mg/dL (ref 6–23)
CHLORIDE: 104 meq/L (ref 96–112)
CO2: 33 mEq/L — ABNORMAL HIGH (ref 19–32)
Calcium: 9.2 mg/dL (ref 8.4–10.5)
Creatinine, Ser: 0.85 mg/dL (ref 0.40–1.50)
GFR: 91.21 mL/min (ref >60.00)
Glucose, Bld: 75 mg/dL (ref 70–99)
POTASSIUM: 4.7 meq/L (ref 3.5–5.1)
SODIUM: 141 meq/L (ref 135–145)

## 2017-10-27 LAB — LIPID PANEL
CHOLESTEROL: 121 mg/dL (ref 0–200)
HDL: 57 mg/dL (ref >39.00)
LDL CALC: 54 mg/dL (ref 0–99)
NonHDL: 63.61
TRIGLYCERIDES: 48 mg/dL (ref 0.0–149.0)
Total CHOL/HDL Ratio: 2
VLDL: 9.6 mg/dL (ref 0.0–40.0)

## 2017-10-27 LAB — URINALYSIS
BILIRUBIN URINE: NEGATIVE
HGB URINE DIPSTICK: NEGATIVE
KETONES UR: NEGATIVE
Leukocytes, UA: NEGATIVE
Nitrite: NEGATIVE
Specific Gravity, Urine: 1.015 (ref 1.000–1.030)
Total Protein, Urine: NEGATIVE
URINE GLUCOSE: NEGATIVE
UROBILINOGEN UA: 0.2 (ref 0.0–1.0)
pH: 7 (ref 5.0–8.0)

## 2017-10-27 LAB — TSH: TSH: 1.27 u[IU]/mL (ref 0.35–4.50)

## 2017-10-27 LAB — HEPATIC FUNCTION PANEL
ALBUMIN: 4.2 g/dL (ref 3.5–5.2)
ALT: 17 U/L (ref 0–53)
AST: 21 U/L (ref 0–37)
Alkaline Phosphatase: 68 U/L (ref 39–117)
BILIRUBIN TOTAL: 1.4 mg/dL — AB (ref 0.2–1.2)
Bilirubin, Direct: 0.2 mg/dL (ref 0.0–0.3)
TOTAL PROTEIN: 5.9 g/dL — AB (ref 6.0–8.3)

## 2017-10-27 LAB — PSA: PSA: 7.38 ng/mL — AB (ref 0.10–4.00)

## 2017-10-27 MED ORDER — ZOSTER VAC RECOMB ADJUVANTED 50 MCG/0.5ML IM SUSR: 0.5000 mL | Freq: Once | INTRAMUSCULAR | 1 refills | Status: AC

## 2017-10-27 NOTE — Assessment & Plan Note (Signed)
  Metoprolol, furosemide

## 2017-10-27 NOTE — Progress Notes (Signed)
Subjective:  Patient ID: Jerry Gomez, male    DOB: July 13, 1931  Age: 83 y.o. MRN: [id number]  CC: No chief complaint on file.   HPI AARON BOSTWICK presents for dyslipidemia, CLL, HTN f/u  Outpatient Medications Prior to Visit  Medication Sig Dispense Refill  . Artificial Tear Ointment (ARTIFICIAL TEARS) ointment Place into both eyes nightly.    [name] aspirin 162 MG EC tablet Take 1 tablet (162 mg total) by mouth daily. 100 tablet 3  . atorvastatin (LIPITOR) 20 MG tablet Take 1 tablet (20 mg total) by mouth daily. 90 tablet 1  . furosemide (LASIX) 20 MG tablet Take 1 tablet (20 mg total) by mouth daily. 90 tablet 3  . metoprolol succinate (TOPROL-XL) 50 MG 24 hr tablet Take 1 tablet (50 mg total) daily by mouth. Take with or immediately following a meal. 90 tablet 2  . Multiple Vitamin (MULTIVITAMIN) tablet Take 1 tablet by mouth daily.       No facility-administered medications prior to visit.     ROS Review of Systems  Constitutional: Negative for appetite change, fatigue and unexpected weight change.  HENT: Negative for congestion, nosebleeds, sneezing, sore throat and trouble swallowing.   Eyes: Negative for itching and visual disturbance.  Respiratory: Negative for cough.   Cardiovascular: Negative for chest pain, palpitations and leg swelling.  Gastrointestinal: Negative for abdominal distention, blood in stool, diarrhea and nausea.  Genitourinary: Negative for frequency and hematuria.  Musculoskeletal: Positive for arthralgias. Negative for back pain, gait problem, joint swelling and neck pain.  Skin: Negative for rash.  Neurological: Negative for dizziness, tremors, speech difficulty and weakness.  Psychiatric/Behavioral: Negative for agitation, dysphoric mood and sleep disturbance. The patient is not nervous/anxious.     Objective:  BP 122/66 (BP Location: Left Arm, Patient Position: Sitting, Cuff Size: Normal)   Pulse (!) 57   Temp 98 F (36.7 C) (Oral)   Ht 5\' 6"   (1.676 m)   Wt 160 lb (72.6 kg)   SpO2 100%   BMI 25.82 kg/m   BP Readings from Last 3 Encounters:  10/27/17 122/66  10/06/17 138/68  06/17/17 (!) 123/52    Wt Readings from Last 3 Encounters:  10/27/17 160 lb (72.6 kg)  10/06/17 162 lb (73.5 kg)  06/17/17 161 lb 8 oz (73.3 kg)    Physical Exam  Constitutional: He is oriented to person, place, and time. He appears well-developed. No distress.  NAD  HENT:  Mouth/Throat: Oropharynx is clear and moist.  Eyes: Conjunctivae are normal. Pupils are equal, round, and reactive to light.  Neck: Normal range of motion. No JVD present. No thyromegaly present.  Cardiovascular: Normal rate, regular rhythm, normal heart sounds and intact distal pulses. Exam reveals no gallop and no friction rub.  No murmur heard. Pulmonary/Chest: Effort normal and breath sounds normal. No respiratory distress. He has no wheezes. He has no rales. He exhibits no tenderness.  Abdominal: Soft. Bowel sounds are normal. He exhibits no distension and no mass. There is no tenderness. There is no rebound and no guarding.  Musculoskeletal: Normal range of motion. He exhibits no edema or tenderness.  Lymphadenopathy:    He has no cervical adenopathy.  Neurological: He is alert and oriented to person, place, and time. He has normal reflexes. No cranial nerve deficit. He exhibits normal muscle tone. He displays a negative Romberg sign. Coordination and gait normal.  Skin: Skin is warm and dry. No rash noted.  Psychiatric: He has a normal mood  and affect. His behavior is normal. Judgment and thought content normal.    Lab Results  Component Value Date   WBC 38.6 (H) 06/17/2017   HGB 14.7 06/17/2017   HCT 44.6 06/17/2017   PLT 122 (L) 06/17/2017   GLUCOSE 100 06/17/2017   CHOL 127 04/08/2016   TRIG 67.0 04/08/2016   HDL 52.10 04/08/2016   LDLCALC 62 04/08/2016   ALT 27 06/17/2017   AST 29 06/17/2017   NA 141 06/17/2017   K 4.5 06/17/2017   CL 104 04/22/2017    CREATININE 1.1 06/17/2017   BUN 16.4 06/17/2017   CO2 30 (H) 06/17/2017   TSH 1.12 04/08/2016   PSA 8.47 (H) 10/09/2016    No results found.  Assessment & Plan:   There are no diagnoses linked to this encounter. I am having Juvon R. Trigg maintain his multivitamin, aspirin, artificial tears, furosemide, atorvastatin, and metoprolol succinate.  No orders of the defined types were placed in this encounter.    Follow-up: No Follow-up on file.  Walker Kehr, MD

## 2017-10-27 NOTE — Assessment & Plan Note (Signed)
Being checked q 6 mo

## 2017-10-27 NOTE — Assessment & Plan Note (Signed)
On Lipitor 

## 2017-10-27 NOTE — Patient Instructions (Signed)
MC well w/Jill 

## 2017-10-27 NOTE — Assessment & Plan Note (Signed)
PSA

## 2017-11-04 DIAGNOSIS — L570 Actinic keratosis: Secondary | ICD-10-CM | POA: Diagnosis not present

## 2017-11-04 DIAGNOSIS — D692 Other nonthrombocytopenic purpura: Secondary | ICD-10-CM | POA: Diagnosis not present

## 2017-11-04 DIAGNOSIS — L821 Other seborrheic keratosis: Secondary | ICD-10-CM | POA: Diagnosis not present

## 2017-11-04 DIAGNOSIS — Z85828 Personal history of other malignant neoplasm of skin: Secondary | ICD-10-CM | POA: Diagnosis not present

## 2017-12-11 ENCOUNTER — Telehealth: Payer: Self-pay | Admitting: Internal Medicine

## 2017-12-11 MED ORDER — ATORVASTATIN CALCIUM 20 MG PO TABS: 20.0000 mg | ORAL_TABLET | Freq: Every day | ORAL | 3 refills | Status: DC

## 2017-12-11 NOTE — Telephone Encounter (Signed)
atorvastatin (LIPITOR) 20 MG tablet  [facility] - [place], [place] [phone] (Phone) [phone] (Fax)    Patient is requesting a refill on this medication.

## 2017-12-11 NOTE — Telephone Encounter (Signed)
Reviewed chart pt is up-to-date sent refills to [facility].../lmb  

## 2017-12-17 ENCOUNTER — Other Ambulatory Visit: Payer: Medicare Other

## 2017-12-24 ENCOUNTER — Inpatient Hospital Stay: Payer: Medicare Other

## 2017-12-24 ENCOUNTER — Telehealth: Payer: Self-pay | Admitting: Internal Medicine

## 2017-12-24 ENCOUNTER — Encounter: Payer: Self-pay | Admitting: Oncology

## 2017-12-24 ENCOUNTER — Inpatient Hospital Stay: Payer: Medicare Other | Attending: Internal Medicine | Admitting: Oncology

## 2017-12-24 VITALS — BP 123/50 | HR 64 | Temp 97.6°F | Resp 18 | Ht 68.0 in | Wt 158.2 lb

## 2017-12-24 DIAGNOSIS — C9110 Chronic lymphocytic leukemia of B-cell type not having achieved remission: Secondary | ICD-10-CM | POA: Diagnosis not present

## 2017-12-24 DIAGNOSIS — D72819 Decreased white blood cell count, unspecified: Secondary | ICD-10-CM | POA: Diagnosis not present

## 2017-12-24 LAB — CBC WITH DIFFERENTIAL/PLATELET
BASOS ABS: 0.1 10*3/uL (ref 0.0–0.1)
Basophils Relative: 0 %
EOS ABS: 0.1 10*3/uL (ref 0.0–0.5)
EOS PCT: 0 %
HCT: 42 % (ref 38.4–49.9)
Hemoglobin: 13.7 g/dL (ref 13.0–17.1)
Lymphocytes Relative: 91 %
Lymphs Abs: 31.7 10*3/uL — ABNORMAL HIGH (ref 0.9–3.3)
MCH: 32.4 pg (ref 27.2–33.4)
MCHC: 32.6 g/dL (ref 32.0–36.0)
MCV: 99.3 fL — ABNORMAL HIGH (ref 79.3–98.0)
MONO ABS: 0.3 10*3/uL (ref 0.1–0.9)
Monocytes Relative: 1 %
Neutro Abs: 2.8 10*3/uL (ref 1.5–6.5)
Neutrophils Relative %: 8 %
PLATELETS: 111 10*3/uL — AB (ref 140–400)
RBC: 4.23 MIL/uL (ref 4.20–5.82)
RDW: 13.2 % (ref 11.0–15.6)
WBC: 34.9 10*3/uL — AB (ref 4.0–10.3)

## 2017-12-24 LAB — COMPREHENSIVE METABOLIC PANEL
ALT: 23 U/L (ref 0–55)
AST: 25 U/L (ref 5–34)
Albumin: 4.2 g/dL (ref 3.5–5.0)
Alkaline Phosphatase: 84 U/L (ref 40–150)
Anion gap: 7 (ref 3–11)
BUN: 17 mg/dL (ref 7–26)
CHLORIDE: 103 mmol/L (ref 98–109)
CO2: 31 mmol/L — AB (ref 22–29)
CREATININE: 1.18 mg/dL (ref 0.70–1.30)
Calcium: 9.1 mg/dL (ref 8.4–10.4)
GFR calc Af Amer: >60 mL/min (ref >60)
GFR calc non Af Amer: 55 mL/min — ABNORMAL LOW (ref >60)
Glucose, Bld: 109 mg/dL (ref 70–140)
POTASSIUM: 4.1 mmol/L (ref 3.5–5.1)
SODIUM: 141 mmol/L (ref 136–145)
Total Bilirubin: 1.6 mg/dL — ABNORMAL HIGH (ref 0.2–1.2)
Total Protein: 6.4 g/dL (ref 6.4–8.3)

## 2017-12-24 LAB — LACTATE DEHYDROGENASE: LDH: 198 U/L (ref 125–245)

## 2017-12-24 NOTE — Progress Notes (Signed)
[facility] OFFICE PROGRESS NOTE  [name], [name], MD [place] [address]  DIAGNOSIS: Stage 0 Chronic lymphocytic leukemia   PRIOR THERAPY: None  CURRENT THERAPY: Observation  INTERVAL HISTORY: Jerry Gomez 84 y.o. male returns for a six-month follow-up visit. The patient is feeling fine today with no specific complaints. He denied having any chest pain, shortness breath, cough or hemoptysis. He denied having any weight loss or night sweats. He has no nausea, vomiting, diarrhea or constipation. He has few bruises and ecchymosis but no significant bleeding.  He is here today for evaluation and repeat blood work.  MEDICAL HISTORY: Past Medical History:  Diagnosis Date  . Cancer ([place])   . Cataract   . COLONIC POLYPS 03/02/2009  . EYE SURGERY, HX OF 09/05/2007  . HEMORRHOIDS, HX OF 09/05/2007  . HYPERLIPIDEMIA 09/05/2007  . HYPERTENSION 07/25/2007  . OSTEOARTHRITIS 09/05/2007  . SLEEP APNEA, OBSTRUCTIVE 09/05/2007    ALLERGIES:  has No Known Allergies.  MEDICATIONS:  Current Outpatient Medications  Medication Sig Dispense Refill  . Artificial Tear Ointment (ARTIFICIAL TEARS) ointment Place into both eyes nightly.    [name] aspirin 162 MG EC tablet Take 1 tablet (162 mg total) by mouth daily. 100 tablet 3  . atorvastatin (LIPITOR) 20 MG tablet Take 1 tablet (20 mg total) by mouth daily. 90 tablet 3  . furosemide (LASIX) 20 MG tablet Take 1 tablet (20 mg total) by mouth daily. 90 tablet 3  . metoprolol succinate (TOPROL-XL) 50 MG 24 hr tablet Take 1 tablet (50 mg total) daily by mouth. Take with or immediately following a meal. 90 tablet 2  . Multiple Vitamin (MULTIVITAMIN) tablet Take 1 tablet by mouth daily.       No current facility-administered medications for this visit.     SURGICAL HISTORY:  Past Surgical History:  Procedure Laterality Date  . EYE SURGERY  10/22/13   left eye, cataract    REVIEW OF SYSTEMS:   Review of Systems   Constitutional: Negative for appetite change, chills, fatigue, fever and unexpected weight change.  HENT:   Negative for mouth sores, nosebleeds, sore throat and trouble swallowing.   Eyes: Negative for eye problems and icterus.  Respiratory: Negative for cough, hemoptysis, shortness of breath and wheezing.   Cardiovascular: Negative for chest pain and leg swelling.  Gastrointestinal: Negative for abdominal pain, constipation, diarrhea, nausea and vomiting.  Genitourinary: Negative for bladder incontinence, difficulty urinating, dysuria, frequency and hematuria.   Musculoskeletal: Negative for back pain, gait problem, neck pain and neck stiffness.  Skin: Negative for itching and rash.  Neurological: Negative for dizziness, extremity weakness, gait problem, headaches, light-headedness and seizures.  Hematological: Negative for adenopathy. Psychiatric/Behavioral: Negative for confusion, depression and sleep disturbance. The patient is not nervous/anxious.     PHYSICAL EXAMINATION:  Blood pressure (!) 123/50, pulse 64, temperature 97.6 F (36.4 C), temperature source Oral, resp. rate 18, height 5\' 8"  (1.727 m), weight 158 lb 3.2 oz (71.8 kg), SpO2 99 %.  ECOG PERFORMANCE STATUS: 1 - Symptomatic but completely ambulatory  Physical Exam  Constitutional: Oriented to person, place, and time and well-developed, well-nourished, and in no distress. No distress.  HENT:  Head: Normocephalic and atraumatic.  Mouth/Throat: Oropharynx is clear and moist. No oropharyngeal exudate.  Eyes: Conjunctivae are normal. Right eye exhibits no discharge. Left eye exhibits no discharge. No scleral icterus.  Neck: Normal range of motion. Neck supple.  Cardiovascular: Normal rate, regular rhythm, normal heart sounds and  intact distal pulses.   Pulmonary/Chest: Effort normal and breath sounds normal. No respiratory distress. No wheezes. No rales.  Abdominal: Soft. Bowel sounds are normal. Exhibits no distension and  no mass. There is no tenderness.  Musculoskeletal: Normal range of motion. Exhibits no edema.  Lymphadenopathy:    No cervical adenopathy.  Neurological: Alert and oriented to person, place, and time. Exhibits normal muscle tone. Gait normal. Coordination normal.  Skin: Skin is warm and dry. No rash noted. Not diaphoretic. No erythema. No pallor.  Psychiatric: Mood, memory and judgment normal.  Vitals reviewed.  LABORATORY DATA: Lab Results  Component Value Date   WBC 34.9 (H) 12/24/2017   HGB 13.7 12/24/2017   HCT 42.0 12/24/2017   MCV 99.3 (H) 12/24/2017   PLT 111 (L) 12/24/2017      Chemistry      Component Value Date/Time   NA 141 12/24/2017 1309   NA 141 06/17/2017 1259   K 4.1 12/24/2017 1309   K 4.5 06/17/2017 1259   CL 103 12/24/2017 1309   CL 104 12/14/2012 1031   CO2 31 (H) 12/24/2017 1309   CO2 30 (H) 06/17/2017 1259   BUN 17 12/24/2017 1309   BUN 16.4 06/17/2017 1259   CREATININE 1.18 12/24/2017 1309   CREATININE 1.1 06/17/2017 1259      Component Value Date/Time   CALCIUM 9.1 12/24/2017 1309   CALCIUM 9.8 06/17/2017 1259   ALKPHOS 84 12/24/2017 1309   ALKPHOS 75 06/17/2017 1259   AST 25 12/24/2017 1309   AST 29 06/17/2017 1259   ALT 23 12/24/2017 1309   ALT 27 06/17/2017 1259   BILITOT 1.6 (H) 12/24/2017 1309   BILITOT 2.19 (H) 06/17/2017 1259       RADIOGRAPHIC STUDIES:  No results found.   ASSESSMENT/PLAN:  CLL (chronic lymphocytic leukemia) This is a very pleasant 84 year old white male with a stage 0 chronic lymphocytic leukemia. The patient is currently on observation. CBC today showed a mild decrease in his total white blood count. The patient is currently asymptomatic and no concerning findings for significant disease progression to recommend treatment at this point. I recommended for the patient to continue on observation with repeat CBC, comprehensive metabolic panel and LDH in 6 months.  He was advised to call me immediately if he  has any concerning symptoms in the interval. All questions were answered. The patient knows to call the clinic with any problems, questions or concerns. We can certainly see the patient much sooner if necessary.  Orders Placed This Encounter  Procedures  . CBC with Differential (Cancer Center Only)    Standing Status:   Future    Standing Expiration Date:   12/24/2018  . CMP ([facility] only)    Standing Status:   Future    Standing Expiration Date:   12/24/2018  . Lactate dehydrogenase    Standing Status:   Future    Standing Expiration Date:   12/24/2018    Mikey Bussing, DNP, AGPCNP-BC, AOCNP 12/24/17

## 2017-12-24 NOTE — Assessment & Plan Note (Signed)
This is a very pleasant 84 year old white male with a stage 0 chronic lymphocytic leukemia. The patient is currently on observation. CBC today showed a mild decrease in his total white blood count. The patient is currently asymptomatic and no concerning findings for significant disease progression to recommend treatment at this point. I recommended for the patient to continue on observation with repeat CBC, comprehensive metabolic panel and LDH in 6 months.  He was advised to call me immediately if he has any concerning symptoms in the interval. All questions were answered. The patient knows to call the clinic with any problems, questions or concerns. We can certainly see the patient much sooner if necessary.

## 2017-12-24 NOTE — Telephone Encounter (Signed)
Gave avs and calendar for july °

## 2018-02-03 DIAGNOSIS — H1849 Other corneal degeneration: Secondary | ICD-10-CM | POA: Diagnosis not present

## 2018-02-03 DIAGNOSIS — H35373 Puckering of macula, bilateral: Secondary | ICD-10-CM | POA: Diagnosis not present

## 2018-02-03 DIAGNOSIS — Z961 Presence of intraocular lens: Secondary | ICD-10-CM | POA: Diagnosis not present

## 2018-02-03 DIAGNOSIS — H18421 Band keratopathy, right eye: Secondary | ICD-10-CM | POA: Diagnosis not present

## 2018-02-27 DIAGNOSIS — H18421 Band keratopathy, right eye: Secondary | ICD-10-CM | POA: Diagnosis not present

## 2018-02-27 DIAGNOSIS — Z9842 Cataract extraction status, left eye: Secondary | ICD-10-CM | POA: Diagnosis not present

## 2018-02-27 DIAGNOSIS — Z961 Presence of intraocular lens: Secondary | ICD-10-CM | POA: Diagnosis not present

## 2018-04-17 NOTE — Progress Notes (Addendum)
Subjective:   Jerry Gomez is a 84 y.o. male who presents for Medicare Annual/Subsequent preventive examination.  Review of Systems:  No ROS.  Medicare Wellness Visit. Additional risk factors are reflected in the social history.  Cardiac Risk Factors include: advanced age (>31men, >4 women);dyslipidemia;hypertension Sleep patterns: feels rested on waking, gets up 1 times nightly to void and sleeps 7 hours nightly.    Home Safety/Smoke Alarms: Feels safe in home. Smoke alarms in place.  Living environment; residence and Firearm Safety: 2-story house, no firearms.Lives with wife, no needs for DME, good support system Seat Belt Safety/Bike Helmet: Wears seat belt.     Objective:    Vitals: BP (!) 144/60   Pulse (!) 54   Resp 17   Ht 5\' 8"  (1.727 m)   Wt 160 lb (72.6 kg)   SpO2 98%   BMI 24.33 kg/m   Body mass index is 24.33 kg/m.  Advanced Directives 04/20/2018 04/10/2017 06/17/2016 12/18/2015 06/14/2015 12/14/2014 06/15/2014  Does Patient Have a Medical Advance Directive? Yes Yes No Yes Yes Yes Patient has advance directive, copy not in chart  Type of Advance Directive [place];Living will [place];Living will - Healthcare Power of [place];Living will -  Does patient want to make changes to medical advance directive? - - - - - No - Patient declined -  Copy of [place] in Chart? No - copy requested No - copy requested - No - copy requested - - -  Would patient like information on creating a medical advance directive? - - No - patient declined information - - - -    Tobacco Social History   Tobacco Use  Smoking Status Former Smoker  . Types: Cigarettes  . Last attempt to quit: 02/07/1963  . Years since quitting: 55.2  Smokeless Tobacco Never Used     Counseling given: Not Answered  Past Medical History:  Diagnosis Date  . Cancer ([place])   . Cataract   . COLONIC  POLYPS 03/02/2009  . EYE SURGERY, HX OF 09/05/2007  . HEMORRHOIDS, HX OF 09/05/2007  . HYPERLIPIDEMIA 09/05/2007  . HYPERTENSION 07/25/2007  . OSTEOARTHRITIS 09/05/2007  . SLEEP APNEA, OBSTRUCTIVE 09/05/2007   Past Surgical History:  Procedure Laterality Date  . EYE SURGERY  10/22/13   left eye, cataract   Family History  Problem Relation Age of Onset  . Kidney disease Father   . Cancer Father        prostate-late on-set  . Cancer Brother        non-hodgkins lymphoma  . Colon cancer Neg Hx   . Esophageal cancer Neg Hx   . Rectal cancer Neg Hx   . Stomach cancer Neg Hx    Social History   Socioeconomic History  . Marital status: Married    Spouse name: Not on file  . Number of children: 2  . Years of education: 25  . Highest education level: Not on file  Occupational History  . Occupation: retired  Scientific laboratory technician  . Financial resource strain: Not hard at all  . Food insecurity:    Worry: Never true    Inability: Never true  . Transportation needs:    Medical: No    Non-medical: No  Tobacco Use  . Smoking status: Former Smoker    Types: Cigarettes    Last attempt to quit: 02/07/1963    Years since quitting: 55.2  . Smokeless  tobacco: Never Used  Substance and Sexual Activity  . Alcohol use: Yes    Alcohol/week: 7.0 oz    Types: 14 Standard drinks or equivalent per week    Comment: Rosan Calbert  . Drug use: No  . Sexual activity: Yes    Partners: Female  Lifestyle  . Physical activity:    Days per week: 4 days    Minutes per session: 60 min  . Stress: Only a little  Relationships  . Social connections:    Talks on phone: More than three times a week    Gets together: More than three times a week    Attends religious service: More than 4 times per year    Active member of club or organization: Yes    Attends meetings of clubs or organizations: More than 4 times per year    Relationship status: Married  Other Topics Concern  . Not on file  Social History Narrative    HSG, [place]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 Married '54. 2 sons ' 63, '67. 2 grandchildren.   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 CPR- yes, short-term mechanical ventilation, no prolonged heroic or futile measures.     Outpatient Encounter Medications as of 04/20/2018  Medication Sig  . Artificial Tear Ointment (ARTIFICIAL TEARS) ointment Place into both eyes nightly.  [name] aspirin 162 MG EC tablet Take 1 tablet (162 mg total) by mouth daily.  [name] atorvastatin (LIPITOR) 20 MG tablet Take 1 tablet (20 mg total) by mouth daily.  . furosemide (LASIX) 20 MG tablet Take 1 tablet (20 mg total) by mouth daily.  . metoprolol succinate (TOPROL-XL) 50 MG 24 hr tablet Take 1 tablet (50 mg total) daily by mouth. Take with or immediately following a meal.  . Multiple Vitamin (MULTIVITAMIN) tablet Take 1 tablet by mouth daily.     No facility-administered encounter medications on file as of 04/20/2018.     Activities of Daily Living In your present state of health, do you have any difficulty performing the following activities: 04/20/2018  Hearing? N  Vision? N  Difficulty concentrating or making decisions? N  Walking or climbing stairs? N  Dressing or bathing? N  Doing errands, shopping? N  Preparing Food and eating ? N  Using the Toilet? N  In the past six months, have you accidently leaked urine? N  Do you have problems with loss of bowel control? N  Managing your Medications? N  Managing your Finances? N  Housekeeping or managing your Housekeeping? N  Some recent data might be hidden    Patient Care Team: [name], [name], MD as PCP - General (Internal Medicine) Griselda Miner, MD (Dermatology) Curt Bears, MD (Hematology and Oncology)   Assessment:   This is a routine wellness examination for [place]. Physical assessment deferred to PCP.   Exercise Activities and Dietary recommendations Current Exercise Habits: Home exercise routine, Type of exercise:  walking(plays golf), Time (Minutes): 50, Frequency (Times/Week): 4, Weekly Exercise (Minutes/Week): 200, Intensity: Mild, Exercise limited by: None identified  Diet (meal preparation, eat out, water intake, caffeinated beverages, dairy products, fruits and vegetables): in general, a "healthy" diet  , well balanced, eats a variety of fruits and vegetables daily, limits salt, fat/cholesterol, sugar,carbohydrates,caffeine, drinks 6-8 glasses of water daily.  Goals    . Patient Stated     Stay as healthy and as independent as possible. Enjoy life and family.       Fall Risk Fall Risk  04/20/2018  04/10/2017 12/03/2016 11/30/2016 11/29/2016  Falls in the past year? No No No No No    Depression Screen PHQ 2/9 Scores 04/20/2018 04/10/2017 12/03/2016 11/30/2016  PHQ - 2 Score 0 0 0 0  PHQ- 9 Score 0 2 - -  Exception Documentation - - - -    Cognitive Function MMSE - Mini Mental State Exam 04/20/2018  Orientation to time 5  Orientation to Place 5  Registration 3  Attention/ Calculation 4  Recall 1  Language- name 2 objects 2  Language- repeat 1  Language- follow 3 step command 3  Language- read & follow direction 1  Write a sentence 1  Copy design 1  Total score 27        Immunization History  Administered Date(s) Administered  . DTP 09/08/1998  . Influenza Split 08/28/2011, 09/08/2012  . Influenza Whole 09/07/2008, 09/02/2009, 09/06/2010  . Influenza, High Dose Seasonal PF 08/29/2016, 09/09/2017  . Influenza,inj,Quad PF,6+ Mos 08/04/2013, 09/08/2014, 09/22/2015  . Pneumococcal Conjugate-13 10/10/2014  . Pneumococcal Polysaccharide-23 09/08/2001, 01/15/2010  . Td 01/15/2010  . Zoster 05/05/2011    Screening Tests Health Maintenance  Topic Date Due  . INFLUENZA VACCINE  07/09/2018  . TETANUS/TDAP  01/16/2020  . PNA vac Low Risk Adult  Completed      Plan:    Continue doing brain stimulating activities (puzzles, reading, adult coloring books, staying active) to keep  memory sharp.   Continue to eat heart healthy diet (full of fruits, vegetables, whole grains, lean protein, water--limit salt, fat, and sugar intake) and increase physical activity as tolerated.  I have personally reviewed and noted the following in the patient's chart:   . Medical and social history . Use of alcohol, tobacco or illicit drugs  . Current medications and supplements . Functional ability and status . Nutritional status . Physical activity . Advanced directives . List of other physicians . Vitals . Screenings to include cognitive, depression, and falls . Referrals and appointments  In addition, I have reviewed and discussed with patient certain preventive protocols, quality metrics, and best practice recommendations. A written personalized care plan for preventive services as well as general preventive health recommendations were provided to patient.     Michiel Cowboy, RN  04/20/2018  Medical screening examination/treatment/procedure(s) were performed by non-physician practitioner and as supervising physician I was immediately available for consultation/collaboration. I agree with above. [name], MD

## 2018-04-20 ENCOUNTER — Ambulatory Visit (INDEPENDENT_AMBULATORY_CARE_PROVIDER_SITE_OTHER): Payer: Medicare Other | Admitting: *Deleted

## 2018-04-20 VITALS — BP 144/60 | HR 54 | Resp 17 | Ht 68.0 in | Wt 160.0 lb

## 2018-04-20 DIAGNOSIS — Z0000 Encounter for general adult medical examination without abnormal findings: Secondary | ICD-10-CM

## 2018-04-20 NOTE — Patient Instructions (Addendum)
Continue doing brain stimulating activities (puzzles, reading, adult coloring books, staying active) to keep memory sharp.   Continue to eat heart healthy diet (full of fruits, vegetables, whole grains, lean protein, water--limit salt, fat, and sugar intake) and increase physical activity as tolerated.  [name] , Thank you for taking time to come for your Medicare Wellness Visit. I appreciate your ongoing commitment to your health goals. Please review the following plan we discussed and let me know if I can assist you in the future.   These are the goals we discussed: Goals    . Patient Stated     Stay as healthy and as independent as possible. Enjoy life and family.       This is a list of the screening recommended for you and due dates:  Health Maintenance  Topic Date Due  . Flu Shot  07/09/2018  . Tetanus Vaccine  01/16/2020  . Pneumonia vaccines  Completed     Health Maintenance, Male A healthy lifestyle and preventive care is important for your health and wellness. Ask your health care provider about what schedule of regular examinations is right for you. What should I know about weight and diet? Eat a Healthy Diet  Eat plenty of vegetables, fruits, whole grains, low-fat dairy products, and lean protein.  Do not eat a lot of foods high in solid fats, added sugars, or salt.  Maintain a Healthy Weight Regular exercise can help you achieve or maintain a healthy weight. You should:  Do at least 150 minutes of exercise each week. The exercise should increase your heart rate and make you sweat (moderate-intensity exercise).  Do strength-training exercises at least twice a week.  Watch Your Levels of Cholesterol and Blood Lipids  Have your blood tested for lipids and cholesterol every 5 years starting at 82 years of age. If you are at high risk for heart disease, you should start having your blood tested when you are 82 years old. You may need to have your cholesterol levels  checked more often if: ? Your lipid or cholesterol levels are high. ? You are older than 82 years of age. ? You are at high risk for heart disease.  What should I know about cancer screening? Many types of cancers can be detected early and may often be prevented. Lung Cancer  You should be screened every year for lung cancer if: ? You are a current smoker who has smoked for at least 30 years. ? You are a former smoker who has quit within the past 15 years.  Talk to your health care provider about your screening options, when you should start screening, and how often you should be screened.  Colorectal Cancer  Routine colorectal cancer screening usually begins at 82 years of age and should be repeated every 5-10 years until you are 81 years old. You may need to be screened more often if early forms of precancerous polyps or small growths are found. Your health care provider may recommend screening at an earlier age if you have risk factors for colon cancer.  Your health care provider may recommend using home test kits to check for hidden blood in the stool.  A small camera at the end of a tube can be used to examine your colon (sigmoidoscopy or colonoscopy). This checks for the earliest forms of colorectal cancer.  Prostate and Testicular Cancer  Depending on your age and overall health, your health care provider may do certain tests to screen  for prostate and testicular cancer.  Talk to your health care provider about any symptoms or concerns you have about testicular or prostate cancer.  Skin Cancer  Check your skin from head to toe regularly.  Tell your health care provider about any new moles or changes in moles, especially if: ? There is a change in a mole's size, shape, or color. ? You have a mole that is larger than a pencil eraser.  Always use sunscreen. Apply sunscreen liberally and repeat throughout the day.  Protect yourself by wearing long sleeves, pants, a  wide-brimmed hat, and sunglasses when outside.  What should I know about heart disease, diabetes, and high blood pressure?  If you are 75-86 years of age, have your blood pressure checked every 3-5 years. If you are 63 years of age or older, have your blood pressure checked every year. You should have your blood pressure measured twice-once when you are at a hospital or clinic, and once when you are not at a hospital or clinic. Record the average of the two measurements. To check your blood pressure when you are not at a hospital or clinic, you can use: ? An automated blood pressure machine at a pharmacy. ? A home blood pressure monitor.  Talk to your health care provider about your target blood pressure.  If you are between 37-71 years old, ask your health care provider if you should take aspirin to prevent heart disease.  Have regular diabetes screenings by checking your fasting blood sugar level. ? If you are at a normal weight and have a low risk for diabetes, have this test once every three years after the age of 27. ? If you are overweight and have a high risk for diabetes, consider being tested at a younger age or more often.  A one-time screening for abdominal aortic aneurysm (AAA) by ultrasound is recommended for men aged 9-75 years who are current or former smokers. What should I know about preventing infection? Hepatitis B If you have a higher risk for hepatitis B, you should be screened for this virus. Talk with your health care provider to find out if you are at risk for hepatitis B infection. Hepatitis C Blood testing is recommended for:  Everyone born from [birth year] through 1963.  Anyone with known risk factors for hepatitis C.  Sexually Transmitted Diseases (STDs)  You should be screened each year for STDs including gonorrhea and chlamydia if: ? You are sexually active and are younger than 82 years of age. ? You are older than 82 years of age and your health care provider  tells you that you are at risk for this type of infection. ? Your sexual activity has changed since you were last screened and you are at an increased risk for chlamydia or gonorrhea. Ask your health care provider if you are at risk.  Talk with your health care provider about whether you are at high risk of being infected with HIV. Your health care provider may recommend a prescription medicine to help prevent HIV infection.  What else can I do?  Schedule regular health, dental, and eye exams.  Stay current with your vaccines (immunizations).  Do not use any tobacco products, such as cigarettes, chewing tobacco, and e-cigarettes. If you need help quitting, ask your health care provider.  Limit alcohol intake to no more than 2 drinks per day. One drink equals 12 ounces of beer, 5 ounces of [place], or 1 ounces of hard liquor.  Do  not use street drugs.  Do not share needles.  Ask your health care provider for help if you need support or information about quitting drugs.  Tell your health care provider if you often feel depressed.  Tell your health care provider if you have ever been abused or do not feel safe at home. This information is not intended to replace advice given to you by your health care provider. Make sure you discuss any questions you have with your health care provider. Document Released: 05/23/2008 Document Revised: 07/24/2016 Document Reviewed: 08/29/2015 Elsevier Interactive Patient Education  2018 [employer]  

## 2018-04-21 DIAGNOSIS — L720 Epidermal cyst: Secondary | ICD-10-CM | POA: Diagnosis not present

## 2018-04-21 DIAGNOSIS — L821 Other seborrheic keratosis: Secondary | ICD-10-CM | POA: Diagnosis not present

## 2018-04-21 DIAGNOSIS — L570 Actinic keratosis: Secondary | ICD-10-CM | POA: Diagnosis not present

## 2018-04-21 DIAGNOSIS — Z85828 Personal history of other malignant neoplasm of skin: Secondary | ICD-10-CM | POA: Diagnosis not present

## 2018-04-21 DIAGNOSIS — D1801 Hemangioma of skin and subcutaneous tissue: Secondary | ICD-10-CM | POA: Diagnosis not present

## 2018-04-21 DIAGNOSIS — L814 Other melanin hyperpigmentation: Secondary | ICD-10-CM | POA: Diagnosis not present

## 2018-04-27 ENCOUNTER — Ambulatory Visit (INDEPENDENT_AMBULATORY_CARE_PROVIDER_SITE_OTHER): Payer: Medicare Other | Admitting: Internal Medicine

## 2018-04-27 ENCOUNTER — Other Ambulatory Visit (INDEPENDENT_AMBULATORY_CARE_PROVIDER_SITE_OTHER): Payer: Medicare Other

## 2018-04-27 ENCOUNTER — Encounter: Payer: Self-pay | Admitting: Internal Medicine

## 2018-04-27 DIAGNOSIS — C9110 Chronic lymphocytic leukemia of B-cell type not having achieved remission: Secondary | ICD-10-CM

## 2018-04-27 DIAGNOSIS — R972 Elevated prostate specific antigen [PSA]: Secondary | ICD-10-CM | POA: Diagnosis not present

## 2018-04-27 DIAGNOSIS — E785 Hyperlipidemia, unspecified: Secondary | ICD-10-CM

## 2018-04-27 DIAGNOSIS — C9190 Lymphoid leukemia, unspecified not having achieved remission: Secondary | ICD-10-CM | POA: Diagnosis not present

## 2018-04-27 LAB — CBC WITH DIFFERENTIAL/PLATELET
BASOS PCT: 0.2 % (ref 0.0–3.0)
Basophils Absolute: 0.1 10*3/uL (ref 0.0–0.1)
EOS ABS: 0 10*3/uL (ref 0.0–0.7)
Eosinophils Relative: 0.1 % (ref 0.0–5.0)
HEMATOCRIT: 40.7 % (ref 39.0–52.0)
Hemoglobin: 13.6 g/dL (ref 13.0–17.0)
Lymphs Abs: 32.7 10*3/uL — ABNORMAL HIGH (ref 0.7–4.0)
MCHC: 33.4 g/dL (ref 30.0–36.0)
MCV: 98 fl (ref 78.0–100.0)
MONOS PCT: 1.3 % — AB (ref 3.0–12.0)
Monocytes Absolute: 0.5 10*3/uL (ref 0.1–1.0)
NEUTROS ABS: 2.7 10*3/uL (ref 1.4–7.7)
Neutrophils Relative %: 7.6 % — ABNORMAL LOW (ref 43.0–77.0)
Platelets: 126 10*3/uL — ABNORMAL LOW (ref 150.0–400.0)
RBC: 4.15 Mil/uL — ABNORMAL LOW (ref 4.22–5.81)
RDW: 13.8 % (ref 11.5–15.5)
WBC: 36 10*3/uL (ref 4.0–10.5)

## 2018-04-27 LAB — HEPATIC FUNCTION PANEL
ALK PHOS: 63 U/L (ref 39–117)
ALT: 17 U/L (ref 0–53)
AST: 18 U/L (ref 0–37)
Albumin: 4.2 g/dL (ref 3.5–5.2)
BILIRUBIN TOTAL: 1.6 mg/dL — AB (ref 0.2–1.2)
Bilirubin, Direct: 0.3 mg/dL (ref 0.0–0.3)
Total Protein: 6.3 g/dL (ref 6.0–8.3)

## 2018-04-27 LAB — BASIC METABOLIC PANEL
BUN: 16 mg/dL (ref 6–23)
CALCIUM: 9 mg/dL (ref 8.4–10.5)
CO2: 31 meq/L (ref 19–32)
CREATININE: 0.95 mg/dL (ref 0.40–1.50)
Chloride: 103 mEq/L (ref 96–112)
GFR: 80.12 mL/min (ref >60.00)
GLUCOSE: 92 mg/dL (ref 70–99)
Potassium: 4.5 mEq/L (ref 3.5–5.1)
Sodium: 140 mEq/L (ref 135–145)

## 2018-04-27 NOTE — Progress Notes (Signed)
Subjective:  Patient ID: Jerry Gomez, male    DOB: 03-Feb-1931  Age: 84 y.o. MRN: [id number]  CC: No chief complaint on file.   HPI Jerry Gomez presents for CAD, CLL, HTN f/u  Outpatient Medications Prior to Visit  Medication Sig Dispense Refill  . Artificial Tear Ointment (ARTIFICIAL TEARS) ointment Place into both eyes nightly.    [name] aspirin 162 MG EC tablet Take 1 tablet (162 mg total) by mouth daily. 100 tablet 3  . atorvastatin (LIPITOR) 20 MG tablet Take 1 tablet (20 mg total) by mouth daily. 90 tablet 3  . furosemide (LASIX) 20 MG tablet Take 1 tablet (20 mg total) by mouth daily. 90 tablet 3  . metoprolol succinate (TOPROL-XL) 50 MG 24 hr tablet Take 1 tablet (50 mg total) daily by mouth. Take with or immediately following a meal. 90 tablet 2  . Multiple Vitamin (MULTIVITAMIN) tablet Take 1 tablet by mouth daily.       No facility-administered medications prior to visit.     ROS Review of Systems  Constitutional: Negative for appetite change, fatigue and unexpected weight change.  HENT: Negative for congestion, nosebleeds, sneezing, sore throat and trouble swallowing.   Eyes: Negative for itching and visual disturbance.  Respiratory: Negative for cough.   Cardiovascular: Negative for chest pain, palpitations and leg swelling.  Gastrointestinal: Negative for abdominal distention, blood in stool, diarrhea and nausea.  Genitourinary: Negative for frequency and hematuria.  Musculoskeletal: Negative for back pain, gait problem, joint swelling and neck pain.  Skin: Negative for rash.  Neurological: Negative for dizziness, tremors, speech difficulty and weakness.  Psychiatric/Behavioral: Negative for agitation, dysphoric mood and sleep disturbance. The patient is not nervous/anxious.     Objective:  BP 134/72 (BP Location: Left Arm, Patient Position: Sitting, Cuff Size: Normal)   Pulse (!) 49   Temp 97.8 F (36.6 C) (Oral)   Ht 5\' 8"  (1.727 m)   Wt 159 lb (72.1 kg)    SpO2 99%   BMI 24.18 kg/m   BP Readings from Last 3 Encounters:  04/27/18 134/72  04/20/18 (!) 144/60  12/24/17 (!) 123/50    Wt Readings from Last 3 Encounters:  04/27/18 159 lb (72.1 kg)  04/20/18 160 lb (72.6 kg)  12/24/17 158 lb 3.2 oz (71.8 kg)    Physical Exam  Constitutional: He is oriented to person, place, and time. He appears well-developed. No distress.  NAD  HENT:  Mouth/Throat: Oropharynx is clear and moist.  Eyes: Pupils are equal, round, and reactive to light. Conjunctivae are normal.  Neck: Normal range of motion. No JVD present. No thyromegaly present.  Cardiovascular: Normal rate, regular rhythm, normal heart sounds and intact distal pulses. Exam reveals no gallop and no friction rub.  No murmur heard. Pulmonary/Chest: Effort normal and breath sounds normal. No respiratory distress. He has no wheezes. He has no rales. He exhibits no tenderness.  Abdominal: Soft. Bowel sounds are normal. He exhibits no distension and no mass. There is no tenderness. There is no rebound and no guarding.  Musculoskeletal: Normal range of motion. He exhibits no edema or tenderness.  Lymphadenopathy:    He has no cervical adenopathy.  Neurological: He is alert and oriented to person, place, and time. He has normal reflexes. No cranial nerve deficit. He exhibits normal muscle tone. He displays a negative Romberg sign. Coordination and gait normal.  Skin: Skin is warm and dry. No rash noted.  Psychiatric: He has a normal mood and affect.  His behavior is normal. Judgment and thought content normal.    Lab Results  Component Value Date   WBC 34.9 (H) 12/24/2017   HGB 13.7 12/24/2017   HCT 42.0 12/24/2017   PLT 111 (L) 12/24/2017   GLUCOSE 109 12/24/2017   CHOL 121 10/27/2017   TRIG 48.0 10/27/2017   HDL 57.00 10/27/2017   LDLCALC 54 10/27/2017   ALT 23 12/24/2017   AST 25 12/24/2017   NA 141 12/24/2017   K 4.1 12/24/2017   CL 103 12/24/2017   CREATININE 1.18 12/24/2017    BUN 17 12/24/2017   CO2 31 (H) 12/24/2017   TSH 1.27 10/27/2017   PSA 7.38 (H) 10/27/2017    No results found.  Assessment & Plan:   There are no diagnoses linked to this encounter. I am having Jerry Gomez maintain his multivitamin, aspirin, artificial tears, furosemide, metoprolol succinate, and atorvastatin.  No orders of the defined types were placed in this encounter.    Follow-up: No follow-ups on file.  Walker Kehr, MD

## 2018-04-27 NOTE — Patient Instructions (Signed)
MC well w/Jill 

## 2018-04-27 NOTE — Assessment & Plan Note (Signed)
PSA

## 2018-04-27 NOTE — Assessment & Plan Note (Signed)
CBC

## 2018-04-27 NOTE — Assessment & Plan Note (Signed)
Lipitor 

## 2018-05-25 ENCOUNTER — Ambulatory Visit (INDEPENDENT_AMBULATORY_CARE_PROVIDER_SITE_OTHER): Payer: Medicare Other | Admitting: Family

## 2018-05-25 ENCOUNTER — Encounter: Payer: Self-pay | Admitting: Family

## 2018-05-25 VITALS — BP 118/60 | HR 64 | Temp 98.5°F | Ht 68.0 in | Wt 155.0 lb

## 2018-05-25 DIAGNOSIS — J019 Acute sinusitis, unspecified: Secondary | ICD-10-CM

## 2018-05-25 MED ORDER — AMOXICILLIN-POT CLAVULANATE 875-125 MG PO TABS: 1.0000 | ORAL_TABLET | Freq: Two times a day (BID) | ORAL | 0 refills | Status: DC

## 2018-05-25 MED ORDER — FUROSEMIDE 20 MG PO TABS: 20.0000 mg | ORAL_TABLET | Freq: Every day | ORAL | 3 refills | Status: DC

## 2018-05-25 MED ORDER — BENZONATATE 100 MG PO CAPS: 100.0000 mg | ORAL_CAPSULE | Freq: Three times a day (TID) | ORAL | 0 refills | Status: DC | PRN

## 2018-05-25 NOTE — Progress Notes (Signed)
NOTNAMED SCHOLZ is a 84 y.o. male with the following history as recorded in Epic[facility]:  Patient Active Problem List   Diagnosis Date Noted  . Bradycardia 10/09/2016  . Sebaceous cyst 10/09/2016  . Carotid bruit 04/08/2016  . Acute sinusitis 11/01/2015  . Elevated PSA 04/10/2015  . Well adult exam 10/10/2014  . Bruising 10/10/2014  . CLL (chronic lymphocytic leukemia) ([place]) 06/09/2012  . Pain of right lower leg 02/07/2012  . Dyslipidemia 09/05/2007  . SLEEP APNEA, OBSTRUCTIVE 09/05/2007  . OSTEOARTHRITIS 09/05/2007  . Essential hypertension 07/25/2007    Current Outpatient Medications  Medication Sig Dispense Refill  . Artificial Tear Ointment (ARTIFICIAL TEARS) ointment Place into both eyes nightly.    [name] aspirin 162 MG EC tablet Take 1 tablet (162 mg total) by mouth daily. 100 tablet 3  . atorvastatin (LIPITOR) 20 MG tablet Take 1 tablet (20 mg total) by mouth daily. 90 tablet 3  . Cholecalciferol (VITAMIN D) 2000 units tablet Take 2,000 Units by mouth daily.    . furosemide (LASIX) 20 MG tablet Take 1 tablet (20 mg total) by mouth daily. 90 tablet 3  . metoprolol succinate (TOPROL-XL) 50 MG 24 hr tablet Take 1 tablet (50 mg total) daily by mouth. Take with or immediately following a meal. 90 tablet 2  . amoxicillin-clavulanate (AUGMENTIN) 875-125 MG tablet Take 1 tablet by mouth 2 (two) times daily. 20 tablet 0  . benzonatate (TESSALON) 100 MG capsule Take 1 capsule (100 mg total) by mouth 3 (three) times daily as needed. 20 capsule 0   No current facility-administered medications for this visit.     Allergies: Patient has no known allergies.  Past Medical History:  Diagnosis Date  . Cancer ([place])   . Cataract   . COLONIC POLYPS 03/02/2009  . EYE SURGERY, HX OF 09/05/2007  . HEMORRHOIDS, HX OF 09/05/2007  . HYPERLIPIDEMIA 09/05/2007  . HYPERTENSION 07/25/2007  . OSTEOARTHRITIS 09/05/2007  . SLEEP APNEA, OBSTRUCTIVE 09/05/2007    Past Surgical History:  Procedure Laterality Date   . EYE SURGERY  10/22/13   left eye, cataract    Family History  Problem Relation Age of Onset  . Kidney disease Father   . Cancer Father        prostate-late on-set  . Cancer Brother        non-hodgkins lymphoma  . Colon cancer Neg Hx   . Esophageal cancer Neg Hx   . Rectal cancer Neg Hx   . Stomach cancer Neg Hx     Social History   Tobacco Use  . Smoking status: Former Smoker    Types: Cigarettes    Last attempt to quit: 02/07/1963    Years since quitting: 55.3  . Smokeless tobacco: Never Used  Substance Use Topics  . Alcohol use: Yes    Alcohol/week: 8.4 oz    Types: 14 Standard drinks or equivalent per week    Comment: wine    Subjective:  1 week history of cough/ congestion/ hoarseness; not prone to seasonal allergies; no chest pain or shortness of breath; history of CLL- doing well/ sees oncology every 6 months; + feels drainage in back of throat;     Objective:  Vitals:   05/25/18 1012  BP: 118/60  Pulse: 64  Temp: 98.5 F (36.9 C)  TempSrc: Oral  SpO2: 96%  Weight: 155 lb 0.6 oz (70.3 kg)  Height: 5\' 8"  (1.727 m)    General: Well developed, well nourished, in no acute distress  Skin :  Warm and dry.  Head: Normocephalic and atraumatic  Eyes: Sclera and conjunctiva clear; pupils round and reactive to light; extraocular movements intact  Ears: External normal; canals clear; tympanic membranes normal  Oropharynx: Pink, supple. No suspicious lesions  Neck: Supple without thyromegaly, adenopathy  Lungs: Respirations unlabored; clear to auscultation bilaterally without wheeze, rales, rhonchi  Neurologic: Alert and oriented; speech intact; face symmetrical; moves all extremities well; CNII-XII intact without focal deficit  Assessment:  1. Acute sinusitis, recurrence not specified, unspecified location     Plan:  Rx for Augmentin, Tessalon; increase fluids, rest and follow-up worse, no better.   No follow-ups on file.  No orders of the defined types were  placed in this encounter.   Requested Prescriptions   Signed Prescriptions Disp Refills  . benzonatate (TESSALON) 100 MG capsule 20 capsule 0    Sig: Take 1 capsule (100 mg total) by mouth 3 (three) times daily as needed.  [name] amoxicillin-clavulanate (AUGMENTIN) 875-125 MG tablet 20 tablet 0    Sig: Take 1 tablet by mouth 2 (two) times daily.  . furosemide (LASIX) 20 MG tablet 90 tablet 3    Sig: Take 1 tablet (20 mg total) by mouth daily.

## 2018-05-25 NOTE — Progress Notes (Signed)
Jerry Gomez is a 84 y.o. male with the following history as recorded in EpicCare:  Patient Active Problem List   Diagnosis Date Noted  . Bradycardia 10/09/2016  . Sebaceous cyst 10/09/2016  . Carotid bruit 04/08/2016  . Acute sinusitis 11/01/2015  . Elevated PSA 04/10/2015  . Well adult exam 10/10/2014  . Bruising 10/10/2014  . CLL (chronic lymphocytic leukemia) ([place]) 06/09/2012  . Pain of right lower leg 02/07/2012  . Dyslipidemia 09/05/2007  . SLEEP APNEA, OBSTRUCTIVE 09/05/2007  . OSTEOARTHRITIS 09/05/2007  . Essential hypertension 07/25/2007    Current Outpatient Medications  Medication Sig Dispense Refill  . Artificial Tear Ointment (ARTIFICIAL TEARS) ointment Place into both eyes nightly.    [name] aspirin 162 MG EC tablet Take 1 tablet (162 mg total) by mouth daily. 100 tablet 3  . atorvastatin (LIPITOR) 20 MG tablet Take 1 tablet (20 mg total) by mouth daily. 90 tablet 3  . Cholecalciferol (VITAMIN D) 2000 units tablet Take 2,000 Units by mouth daily.    . furosemide (LASIX) 20 MG tablet Take 1 tablet (20 mg total) by mouth daily. 90 tablet 3  . metoprolol succinate (TOPROL-XL) 50 MG 24 hr tablet Take 1 tablet (50 mg total) daily by mouth. Take with or immediately following a meal. 90 tablet 2  . amoxicillin-clavulanate (AUGMENTIN) 875-125 MG tablet Take 1 tablet by mouth 2 (two) times daily. 20 tablet 0  . benzonatate (TESSALON) 100 MG capsule Take 1 capsule (100 mg total) by mouth 3 (three) times daily as needed. 20 capsule 0   No current facility-administered medications for this visit.     Allergies: Patient has no known allergies.  Past Medical History:  Diagnosis Date  . Cancer ([place])   . Cataract   . COLONIC POLYPS 03/02/2009  . EYE SURGERY, HX OF 09/05/2007  . HEMORRHOIDS, HX OF 09/05/2007  . HYPERLIPIDEMIA 09/05/2007  . HYPERTENSION 07/25/2007  . OSTEOARTHRITIS 09/05/2007  . SLEEP APNEA, OBSTRUCTIVE 09/05/2007    Past Surgical History:  Procedure Laterality Date   . EYE SURGERY  10/22/13   left eye, cataract    Family History  Problem Relation Age of Onset  . Kidney disease Father   . Cancer Father        prostate-late on-set  . Cancer Brother        non-hodgkins lymphoma  . Colon cancer Neg Hx   . Esophageal cancer Neg Hx   . Rectal cancer Neg Hx   . Stomach cancer Neg Hx     Social History   Tobacco Use  . Smoking status: Former Smoker    Types: Cigarettes    Last attempt to quit: 02/07/1963    Years since quitting: 55.3  . Smokeless tobacco: Never Used  Substance Use Topics  . Alcohol use: Yes    Alcohol/week: 8.4 oz    Types: 14 Standard drinks or equivalent per week    Comment: wine    Subjective:  1 week history of cough/ congestion/ hoarseness; not prone to seasonal allergies; no chest pain or shortness of breath; history of CLL- doing well/ sees oncology every 6 months; + feels drainage in back of throat;      Objective:  Vitals:   05/25/18 1012  BP: 118/60  Pulse: 64  Temp: 98.5 F (36.9 C)  TempSrc: Oral  SpO2: 96%  Weight: 155 lb 0.6 oz (70.3 kg)  Height: 5\' 8"  (1.727 m)    General: Well developed, well nourished, in no acute distress  Skin : Warm and dry.  Head: Normocephalic and atraumatic  Eyes: Sclera and conjunctiva clear; pupils round and reactive to light; extraocular movements intact  Ears: External normal; canals clear; tympanic membranes normal  Oropharynx: Pink, supple. No suspicious lesions  Neck: Supple without thyromegaly, adenopathy  Lungs: Respirations unlabored; clear to auscultation bilaterally without wheeze, rales, rhonchi  CVS exam: normal rate and regular rhythm.  Neurologic: Alert and oriented; speech intact; face symmetrical; moves all extremities well; CNII-XII intact without focal deficit   Assessment:  1. Acute sinusitis, recurrence not specified, unspecified location     Plan:  Rx for Augmentin 875 mg bid x 10 days, Tessalon Perles 100 mg tid prn; increase fluids, rest and  follow-up worse, no better.   No follow-ups on file.  No orders of the defined types were placed in this encounter.   Requested Prescriptions   Signed Prescriptions Disp Refills  . benzonatate (TESSALON) 100 MG capsule 20 capsule 0    Sig: Take 1 capsule (100 mg total) by mouth 3 (three) times daily as needed.  [name] amoxicillin-clavulanate (AUGMENTIN) 875-125 MG tablet 20 tablet 0    Sig: Take 1 tablet by mouth 2 (two) times daily.  . furosemide (LASIX) 20 MG tablet 90 tablet 3    Sig: Take 1 tablet (20 mg total) by mouth daily.

## 2018-06-24 ENCOUNTER — Inpatient Hospital Stay: Payer: Medicare Other | Attending: Internal Medicine | Admitting: Internal Medicine

## 2018-06-24 ENCOUNTER — Encounter: Payer: Self-pay | Admitting: Internal Medicine

## 2018-06-24 ENCOUNTER — Inpatient Hospital Stay: Payer: Medicare Other

## 2018-06-24 ENCOUNTER — Telehealth: Payer: Self-pay | Admitting: Internal Medicine

## 2018-06-24 VITALS — BP 141/70 | HR 57 | Temp 97.6°F | Resp 17 | Ht 68.0 in | Wt 154.6 lb

## 2018-06-24 DIAGNOSIS — I10 Essential (primary) hypertension: Secondary | ICD-10-CM

## 2018-06-24 DIAGNOSIS — D72829 Elevated white blood cell count, unspecified: Secondary | ICD-10-CM | POA: Insufficient documentation

## 2018-06-24 DIAGNOSIS — C9110 Chronic lymphocytic leukemia of B-cell type not having achieved remission: Secondary | ICD-10-CM

## 2018-06-24 LAB — CMP (CANCER CENTER ONLY)
ALT: 22 U/L (ref 0–44)
ANION GAP: 6 (ref 5–15)
AST: 21 U/L (ref 15–41)
Albumin: 4.1 g/dL (ref 3.5–5.0)
Alkaline Phosphatase: 97 U/L (ref 38–126)
BILIRUBIN TOTAL: 1.6 mg/dL — AB (ref 0.3–1.2)
BUN: 13 mg/dL (ref 8–23)
CHLORIDE: 105 mmol/L (ref 98–111)
CO2: 29 mmol/L (ref 22–32)
Calcium: 9.3 mg/dL (ref 8.9–10.3)
Creatinine: 0.87 mg/dL (ref 0.61–1.24)
Glucose, Bld: 100 mg/dL — ABNORMAL HIGH (ref 70–99)
POTASSIUM: 4.1 mmol/L (ref 3.5–5.1)
Sodium: 140 mmol/L (ref 135–145)
TOTAL PROTEIN: 6.3 g/dL — AB (ref 6.5–8.1)

## 2018-06-24 LAB — CBC WITH DIFFERENTIAL (CANCER CENTER ONLY)
BASOS ABS: 0 10*3/uL (ref 0.0–0.1)
Basophils Relative: 0 %
EOS PCT: 0 %
Eosinophils Absolute: 0.1 10*3/uL (ref 0.0–0.5)
HEMATOCRIT: 41.2 % (ref 38.4–49.9)
Hemoglobin: 13.5 g/dL (ref 13.0–17.1)
LYMPHS ABS: 28.4 10*3/uL — AB (ref 0.9–3.3)
LYMPHS PCT: 90 %
MCH: 32 pg (ref 27.2–33.4)
MCHC: 32.7 g/dL (ref 32.0–36.0)
MCV: 97.9 fL (ref 79.3–98.0)
Monocytes Absolute: 0.5 10*3/uL (ref 0.1–0.9)
Monocytes Relative: 2 %
NEUTROS ABS: 2.5 10*3/uL (ref 1.5–6.5)
NEUTROS PCT: 8 %
Platelet Count: 98 10*3/uL — ABNORMAL LOW (ref 140–400)
RBC: 4.21 MIL/uL (ref 4.20–5.82)
RDW: 13.3 % (ref 11.0–14.6)
WBC: 31.6 10*3/uL — AB (ref 4.0–10.3)

## 2018-06-24 LAB — LACTATE DEHYDROGENASE: LDH: 181 U/L (ref 98–192)

## 2018-06-24 NOTE — Telephone Encounter (Signed)
Gave patient avs and calendar of upcoming jan appts. °

## 2018-06-24 NOTE — Progress Notes (Signed)
[facility] [phone]   [phone]  OFFICE PROGRESS NOTE  [name], [name], MD [place] [address]  DIAGNOSIS: Stage 0 Chronic lymphocytic leukemia   PRIOR THERAPY: None   CURRENT THERAPY: Observation  INTERVAL HISTORY: Jerry Gomez 84 y.o. male returns to the clinic today for six-month follow-up visit.  The patient is feeling fine today with no concerning complaints.  He denied having any chest pain, shortness breath, cough or hemoptysis.  He denied having any fever or chills.  He has no nausea, vomiting, diarrhea or constipation.  He denied having any weight loss or night sweats.  He has no palpable lymphadenopathy.  He denied having any bleeding issues.  The patient is here today for evaluation with repeat CBC.  MEDICAL HISTORY: Past Medical History:  Diagnosis Date  . Cancer ([place])   . Cataract   . COLONIC POLYPS 03/02/2009  . EYE SURGERY, HX OF 09/05/2007  . HEMORRHOIDS, HX OF 09/05/2007  . HYPERLIPIDEMIA 09/05/2007  . HYPERTENSION 07/25/2007  . OSTEOARTHRITIS 09/05/2007  . SLEEP APNEA, OBSTRUCTIVE 09/05/2007    ALLERGIES:  has No Known Allergies.  MEDICATIONS:  Current Outpatient Medications  Medication Sig Dispense Refill  . amoxicillin-clavulanate (AUGMENTIN) 875-125 MG tablet Take 1 tablet by mouth 2 (two) times daily. 20 tablet 0  . Artificial Tear Ointment (ARTIFICIAL TEARS) ointment Place into both eyes nightly.    [name] aspirin 162 MG EC tablet Take 1 tablet (162 mg total) by mouth daily. 100 tablet 3  . atorvastatin (LIPITOR) 20 MG tablet Take 1 tablet (20 mg total) by mouth daily. 90 tablet 3  . benzonatate (TESSALON) 100 MG capsule Take 1 capsule (100 mg total) by mouth 3 (three) times daily as needed. 20 capsule 0  . Cholecalciferol (VITAMIN D) 2000 units tablet Take 2,000 Units by mouth daily.    . furosemide (LASIX) 20 MG tablet Take 1 tablet (20 mg total) by mouth daily. 90 tablet 3  . metoprolol  succinate (TOPROL-XL) 50 MG 24 hr tablet Take 1 tablet (50 mg total) daily by mouth. Take with or immediately following a meal. 90 tablet 2   No current facility-administered medications for this visit.     SURGICAL HISTORY:  Past Surgical History:  Procedure Laterality Date  . EYE SURGERY  10/22/13   left eye, cataract    REVIEW OF SYSTEMS:  A comprehensive review of systems was negative.   PHYSICAL EXAMINATION: General appearance: alert, cooperative and no distress Head: Normocephalic, without obvious abnormality, atraumatic Neck: no adenopathy Lymph nodes: Cervical, supraclavicular, and axillary nodes normal. Resp: clear to auscultation bilaterally Back: symmetric, no curvature. ROM normal. No CVA tenderness. Cardio: regular rate and rhythm, S1, S2 normal, no murmur, click, rub or gallop GI: soft, non-tender; bowel sounds normal; no masses,  no organomegaly Extremities: extremities normal, atraumatic, no cyanosis or edema  ECOG PERFORMANCE STATUS: 1 - Symptomatic but completely ambulatory  Blood pressure (!) 141/70, pulse (!) 57, temperature 97.6 F (36.4 C), temperature source Oral, resp. rate 17, height 5\' 8"  (1.727 m), weight 154 lb 9.6 oz (70.1 kg), SpO2 99 %.  LABORATORY DATA: Lab Results  Component Value Date   WBC 31.6 (H) 06/24/2018   HGB 13.5 06/24/2018   HCT 41.2 06/24/2018   MCV 97.9 06/24/2018   PLT 98 (L) 06/24/2018      Chemistry      Component Value Date/Time   NA 140 04/27/2018 1006   NA  141 06/17/2017 1259   K 4.5 04/27/2018 1006   K 4.5 06/17/2017 1259   CL 103 04/27/2018 1006   CL 104 12/14/2012 1031   CO2 31 04/27/2018 1006   CO2 30 (H) 06/17/2017 1259   BUN 16 04/27/2018 1006   BUN 16.4 06/17/2017 1259   CREATININE 0.95 04/27/2018 1006   CREATININE 1.1 06/17/2017 1259      Component Value Date/Time   CALCIUM 9.0 04/27/2018 1006   CALCIUM 9.8 06/17/2017 1259   ALKPHOS 63 04/27/2018 1006   ALKPHOS 75 06/17/2017 1259   AST 18 04/27/2018  1006   AST 29 06/17/2017 1259   ALT 17 04/27/2018 1006   ALT 27 06/17/2017 1259   BILITOT 1.6 (H) 04/27/2018 1006   BILITOT 2.19 (H) 06/17/2017 1259       RADIOGRAPHIC STUDIES: No results found.  ASSESSMENT AND PLAN:  This is a very pleasant 84 years old white male with a stage 0 chronic lymphocytic leukemia. The patient is currently on observation. The patient is doing fine today and currently asymptomatic.  CBC showed stable but elevated total white blood count. I discussed the lab results with the patient recommended for him to continue on observation with repeat CBC, comprehensive metabolic panel and LDH in 6 months. He was advised to call immediately if he has any concerning symptoms in the interval. All questions were answered. The patient knows to call the clinic with any problems, questions or concerns. We can certainly see the patient much sooner if necessary. I spent 10 minutes counseling the patient face to face. The total time spent in the appointment was 15 minutes.  Disclaimer: This note was dictated with voice recognition software. Similar sounding words can inadvertently be transcribed and may not be corrected upon review.

## 2018-07-15 ENCOUNTER — Telehealth: Payer: Self-pay | Admitting: Internal Medicine

## 2018-07-15 MED ORDER — METOPROLOL SUCCINATE ER 50 MG PO TB24: 50.0000 mg | ORAL_TABLET | Freq: Every day | ORAL | 1 refills | Status: DC

## 2018-07-15 NOTE — Telephone Encounter (Signed)
Pt would like a refill of his  metoprolol succinate (TOPROL-XL) 50 MG 24 hr tablet  Please send to [facility] as a 90 day supply.

## 2018-07-15 NOTE — Telephone Encounter (Signed)
Reviewed chart pt is up-to-date sent refills to mail order../[facility]  

## 2018-08-01 ENCOUNTER — Other Ambulatory Visit: Payer: Self-pay | Admitting: Internal Medicine

## 2018-08-24 ENCOUNTER — Ambulatory Visit (INDEPENDENT_AMBULATORY_CARE_PROVIDER_SITE_OTHER): Payer: Medicare Other

## 2018-08-24 DIAGNOSIS — Z23 Encounter for immunization: Secondary | ICD-10-CM

## 2018-10-28 ENCOUNTER — Encounter: Payer: Self-pay | Admitting: Internal Medicine

## 2018-10-28 ENCOUNTER — Ambulatory Visit (INDEPENDENT_AMBULATORY_CARE_PROVIDER_SITE_OTHER): Payer: Medicare Other | Admitting: Internal Medicine

## 2018-10-28 ENCOUNTER — Other Ambulatory Visit (INDEPENDENT_AMBULATORY_CARE_PROVIDER_SITE_OTHER): Payer: Medicare Other

## 2018-10-28 VITALS — BP 140/74 | HR 44 | Temp 98.2°F | Ht 68.0 in | Wt 157.0 lb

## 2018-10-28 DIAGNOSIS — E785 Hyperlipidemia, unspecified: Secondary | ICD-10-CM

## 2018-10-28 DIAGNOSIS — C9110 Chronic lymphocytic leukemia of B-cell type not having achieved remission: Secondary | ICD-10-CM | POA: Diagnosis not present

## 2018-10-28 DIAGNOSIS — Z23 Encounter for immunization: Secondary | ICD-10-CM

## 2018-10-28 DIAGNOSIS — I10 Essential (primary) hypertension: Secondary | ICD-10-CM | POA: Diagnosis not present

## 2018-10-28 DIAGNOSIS — G4700 Insomnia, unspecified: Secondary | ICD-10-CM | POA: Diagnosis not present

## 2018-10-28 DIAGNOSIS — R972 Elevated prostate specific antigen [PSA]: Secondary | ICD-10-CM | POA: Diagnosis not present

## 2018-10-28 LAB — LIPID PANEL
CHOL/HDL RATIO: 2
CHOLESTEROL: 126 mg/dL (ref 0–200)
HDL: 52.6 mg/dL (ref >39.00)
LDL CALC: 65 mg/dL (ref 0–99)
NonHDL: 73.28
Triglycerides: 43 mg/dL (ref 0.0–149.0)
VLDL: 8.6 mg/dL (ref 0.0–40.0)

## 2018-10-28 LAB — TSH: TSH: 1.54 u[IU]/mL (ref 0.35–4.50)

## 2018-10-28 LAB — PSA: PSA: 7.85 ng/mL — AB (ref 0.10–4.00)

## 2018-10-28 MED ORDER — ZOSTER VAC RECOMB ADJUVANTED 50 MCG/0.5ML IM SUSR: 0.5000 mL | Freq: Once | INTRAMUSCULAR | 1 refills | Status: AC

## 2018-10-28 NOTE — Assessment & Plan Note (Addendum)
Metoprolol, furosemide Labs w/Dr Julien Nordmann in 1/19

## 2018-10-28 NOTE — Addendum Note (Signed)
Addended by: Karren Cobble on: 10/28/2018 10:55 AM   Modules accepted: Orders

## 2018-10-28 NOTE — Patient Instructions (Addendum)
Valerian root for insomnia   MC well w/Jill

## 2018-10-28 NOTE — Assessment & Plan Note (Signed)
CBC Pneumovax

## 2018-10-28 NOTE — Assessment & Plan Note (Signed)
Valerian root prn 

## 2018-10-28 NOTE — Progress Notes (Signed)
Established Patient Office Visit  Subjective:  Patient ID: Jerry Gomez, male    DOB: 01-21-1931  Age: 84 y.o. MRN: [id number]  CC: No chief complaint on file.   HPI Jerry Gomez presents for CLL, HTN, dyslipidemia f/u  Past Medical History:  Diagnosis Date  . Cancer ([place] [facility])   . Cataract   . COLONIC POLYPS 03/02/2009  . EYE SURGERY, HX OF 09/05/2007  . HEMORRHOIDS, HX OF 09/05/2007  . HYPERLIPIDEMIA 09/05/2007  . HYPERTENSION 07/25/2007  . OSTEOARTHRITIS 09/05/2007  . SLEEP APNEA, OBSTRUCTIVE 09/05/2007    Past Surgical History:  Procedure Laterality Date  . EYE SURGERY  10/22/13   left eye, cataract    Family History  Problem Relation Age of Onset  . Kidney disease Father   . Cancer Father        prostate-late on-set  . Cancer Brother        non-hodgkins lymphoma  . Colon cancer Neg Hx   . Esophageal cancer Neg Hx   . Rectal cancer Neg Hx   . Stomach cancer Neg Hx     Social History   Socioeconomic History  . Marital status: Married    Spouse name: Not on file  . Number of children: 2  . Years of education: 53  . Highest education level: Not on file  Occupational History  . Occupation: retired  Scientific laboratory technician  . Financial resource strain: Not hard at all  . Food insecurity:    Worry: Never true    Inability: Never true  . Transportation needs:    Medical: No    Non-medical: No  Tobacco Use  . Smoking status: Former Smoker    Types: Cigarettes    Last attempt to quit: 02/07/1963    Years since quitting: 55.7  . Smokeless tobacco: Never Used  Substance and Sexual Activity  . Alcohol use: Yes    Alcohol/week: 14.0 standard drinks    Types: 14 Standard drinks or equivalent per week    Comment: wine  . Drug use: No  . Sexual activity: Yes    Partners: Female  Lifestyle  . Physical activity:    Days per week: 4 days    Minutes per session: 60 min  . Stress: Only a little  Relationships  . Social connections:    Talks on phone: More than three times a  week    Gets together: More than three times a week    Attends religious service: More than 4 times per year    Active member of club or organization: Yes    Attends meetings of clubs or organizations: More than 4 times per year    Relationship status: Married  . Intimate partner violence:    Fear of current or ex partner: No    Emotionally abused: No    Physically abused: No    Forced sexual activity: No  Other Topics Concern  . Not on file  Social History Narrative   HS[employer], [place]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 Married '54. 2 sons ' 63, '67. 2 grandchildren.   Work - retired '07. [employer] - son is second 94. CPR- yes, short-term mechanical ventilation, no prolonged heroic or futile measures.     Outpatient Medications Prior to Visit  Medication Sig Dispense Refill  . Artificial Tear Ointment (ARTIFICIAL TEARS) ointment Place into both eyes nightly.    [name] aspirin 162 MG EC tablet Take 1 tablet (162 mg total) by mouth daily. 100 tablet 3  .  atorvastatin (LIPITOR) 20 MG tablet Take 1 tablet by mouth daily 90 tablet 3  . Cholecalciferol (VITAMIN D) 2000 units tablet Take 2,000 Units by mouth daily.    . furosemide (LASIX) 20 MG tablet Take 1 tablet (20 mg total) by mouth daily. 90 tablet 3  . metoprolol succinate (TOPROL-XL) 50 MG 24 hr tablet Take 1 tablet (50 mg total) by mouth daily. Take with or immediately following a meal. 90 tablet 1  . amoxicillin-clavulanate (AUGMENTIN) 875-125 MG tablet Take 1 tablet by mouth 2 (two) times daily. (Patient not taking: Reported on 10/28/2018) 20 tablet 0  . benzonatate (TESSALON) 100 MG capsule Take 1 capsule (100 mg total) by mouth 3 (three) times daily as needed. (Patient not taking: Reported on 10/28/2018) 20 capsule 0   No facility-administered medications prior to visit.     No Known Allergies  ROS Review of Systems  Constitutional: Negative for appetite change, fatigue and unexpected weight change.  HENT: Negative for congestion,  nosebleeds, sneezing, sore throat and trouble swallowing.   Eyes: Negative for itching and visual disturbance.  Respiratory: Negative for cough.   Cardiovascular: Negative for chest pain, palpitations and leg swelling.  Gastrointestinal: Negative for abdominal distention, blood in stool, diarrhea and nausea.  Genitourinary: Negative for frequency and hematuria.  Musculoskeletal: Negative for back pain, gait problem, joint swelling and neck pain.  Skin: Negative for rash.  Neurological: Negative for dizziness, tremors, speech difficulty and weakness.  Psychiatric/Behavioral: Positive for sleep disturbance. Negative for agitation, dysphoric mood and suicidal ideas. The patient is not nervous/anxious.       Objective:    Physical Exam  Constitutional: He is oriented to person, place, and time. He appears well-developed. No distress.  NAD  HENT:  Mouth/Throat: Oropharynx is clear and moist.  Eyes: Pupils are equal, round, and reactive to light. Conjunctivae are normal.  Neck: Normal range of motion. No JVD present. No thyromegaly present.  Cardiovascular: Normal rate, regular rhythm, normal heart sounds and intact distal pulses. Exam reveals no gallop and no friction rub.  No murmur heard. Pulmonary/Chest: Effort normal and breath sounds normal. No respiratory distress. He has no wheezes. He has no rales. He exhibits no tenderness.  Abdominal: Soft. Bowel sounds are normal. He exhibits no distension and no mass. There is no tenderness. There is no rebound and no guarding.  Musculoskeletal: Normal range of motion. He exhibits no edema or tenderness.  Lymphadenopathy:    He has no cervical adenopathy.  Neurological: He is alert and oriented to person, place, and time. He has normal reflexes. No cranial nerve deficit. He exhibits normal muscle tone. He displays a negative Romberg sign. Coordination and gait normal.  Skin: Skin is warm and dry. No rash noted.  Psychiatric: He has a normal mood  and affect. His behavior is normal. Judgment and thought content normal.    BP 140/74 (BP Location: Left Arm, Patient Position: Sitting, Cuff Size: Normal)   Pulse (!) 44   Temp 98.2 F (36.8 C) (Oral)   Ht 5' 8"  (1.727 m)   Wt 157 lb (71.2 kg)   SpO2 99%   BMI 23.87 kg/m  Wt Readings from Last 3 Encounters:  10/28/18 157 lb (71.2 kg)  06/24/18 154 lb 9.6 oz (70.1 kg)  05/25/18 155 lb 0.6 oz (70.3 kg)     There are no preventive care reminders to display for this patient.  There are no preventive care reminders to display for this patient.  Lab Results  Component Value Date   TSH 1.27 10/27/2017   Lab Results  Component Value Date   WBC 31.6 (H) 06/24/2018   HGB 13.5 06/24/2018   HCT 41.2 06/24/2018   MCV 97.9 06/24/2018   PLT 98 (L) 06/24/2018   Lab Results  Component Value Date   NA 140 06/24/2018   K 4.1 06/24/2018   CHLORIDE 104 06/17/2017   CO2 29 06/24/2018   GLUCOSE 100 (H) 06/24/2018   BUN 13 06/24/2018   CREATININE 0.87 06/24/2018   BILITOT 1.6 (H) 06/24/2018   ALKPHOS 97 06/24/2018   AST 21 06/24/2018   ALT 22 06/24/2018   PROT 6.3 (L) 06/24/2018   ALBUMIN 4.1 06/24/2018   CALCIUM 9.3 06/24/2018   ANIONGAP 6 06/24/2018   EGFR 63 (L) 06/17/2017   GFR 80.12 04/27/2018   Lab Results  Component Value Date   CHOL 121 10/27/2017   Lab Results  Component Value Date   HDL 57.00 10/27/2017   Lab Results  Component Value Date   LDLCALC 54 10/27/2017   Lab Results  Component Value Date   TRIG 48.0 10/27/2017   Lab Results  Component Value Date   CHOLHDL 2 10/27/2017   No results found for: HGBA1C    Assessment & Plan:   Problem List Items Addressed This Visit    None      No orders of the defined types were placed in this encounter.   Follow-up: No follow-ups on file.    Walker Kehr, MD

## 2018-10-28 NOTE — Assessment & Plan Note (Signed)
Atorvastatin

## 2018-12-23 ENCOUNTER — Telehealth: Payer: Self-pay

## 2018-12-23 ENCOUNTER — Inpatient Hospital Stay: Payer: Medicare Other | Attending: Internal Medicine

## 2018-12-23 ENCOUNTER — Inpatient Hospital Stay (HOSPITAL_BASED_OUTPATIENT_CLINIC_OR_DEPARTMENT_OTHER): Payer: Medicare Other | Admitting: Internal Medicine

## 2018-12-23 VITALS — BP 145/59 | HR 55 | Temp 97.8°F | Resp 18 | Ht 68.0 in | Wt 155.8 lb

## 2018-12-23 DIAGNOSIS — C9110 Chronic lymphocytic leukemia of B-cell type not having achieved remission: Secondary | ICD-10-CM

## 2018-12-23 DIAGNOSIS — D72829 Elevated white blood cell count, unspecified: Secondary | ICD-10-CM | POA: Insufficient documentation

## 2018-12-23 DIAGNOSIS — I10 Essential (primary) hypertension: Secondary | ICD-10-CM

## 2018-12-23 LAB — CMP (CANCER CENTER ONLY)
ALK PHOS: 101 U/L (ref 38–126)
ALT: 18 U/L (ref 0–44)
AST: 19 U/L (ref 15–41)
Albumin: 4.2 g/dL (ref 3.5–5.0)
Anion gap: 8 (ref 5–15)
BUN: 14 mg/dL (ref 8–23)
CALCIUM: 9.4 mg/dL (ref 8.9–10.3)
CO2: 30 mmol/L (ref 22–32)
Chloride: 103 mmol/L (ref 98–111)
Creatinine: 0.9 mg/dL (ref 0.61–1.24)
GFR, Est AFR Am: >60 mL/min (ref >60)
GFR, Estimated: >60 mL/min (ref >60)
Glucose, Bld: 99 mg/dL (ref 70–99)
Potassium: 4.4 mmol/L (ref 3.5–5.1)
Sodium: 141 mmol/L (ref 135–145)
Total Bilirubin: 1.8 mg/dL — ABNORMAL HIGH (ref 0.3–1.2)
Total Protein: 6.8 g/dL (ref 6.5–8.1)

## 2018-12-23 LAB — CBC WITH DIFFERENTIAL (CANCER CENTER ONLY)
Abs Immature Granulocytes: 0.06 10*3/uL (ref 0.00–0.07)
BASOS ABS: 0.1 10*3/uL (ref 0.0–0.1)
Basophils Relative: 0 %
Eosinophils Absolute: 0.1 10*3/uL (ref 0.0–0.5)
Eosinophils Relative: 0 %
HCT: 42 % (ref 39.0–52.0)
Hemoglobin: 13.6 g/dL (ref 13.0–17.0)
Immature Granulocytes: 0 %
Lymphocytes Relative: 90 %
Lymphs Abs: 39.3 10*3/uL — ABNORMAL HIGH (ref 0.7–4.0)
MCH: 31.6 pg (ref 26.0–34.0)
MCHC: 32.4 g/dL (ref 30.0–36.0)
MCV: 97.4 fL (ref 80.0–100.0)
Monocytes Absolute: 1.5 10*3/uL — ABNORMAL HIGH (ref 0.1–1.0)
Monocytes Relative: 4 %
Neutro Abs: 2.7 10*3/uL (ref 1.7–7.7)
Neutrophils Relative %: 6 %
PLATELETS: 123 10*3/uL — AB (ref 150–400)
RBC: 4.31 MIL/uL (ref 4.22–5.81)
RDW: 12.9 % (ref 11.5–15.5)
WBC Count: 43.8 10*3/uL — ABNORMAL HIGH (ref 4.0–10.5)
nRBC: 0 % (ref 0.0–0.2)

## 2018-12-23 LAB — LACTATE DEHYDROGENASE: LDH: 181 U/L (ref 98–192)

## 2018-12-23 NOTE — Progress Notes (Signed)
[facility] [phone]   [phone]  OFFICE PROGRESS NOTE  [name], [name], MD [place] [id number]  DIAGNOSIS: Stage 0 Chronic lymphocytic leukemia   PRIOR THERAPY: None   CURRENT THERAPY: Observation  INTERVAL HISTORY: Jerry Gomez 85 y.o. male returns to the clinic today for 6 months follow-up visit.  The patient is feeling fine today with no concerning complaints.  He denied having any chest pain, shortness of breath, cough or hemoptysis.  He denied having any weight loss or night sweats.  He has no palpable lymphadenopathy.  The patient denied having any fever or chills.  He is currently on observation.  He had repeat CBC performed earlier today and is here for evaluation and discussion of his lab results.  MEDICAL HISTORY: Past Medical History:  Diagnosis Date  . Cancer ([facility])   . Cataract   . COLONIC POLYPS 03/02/2009  . EYE SURGERY, HX OF 09/05/2007  . HEMORRHOIDS, HX OF 09/05/2007  . HYPERLIPIDEMIA 09/05/2007  . HYPERTENSION 07/25/2007  . OSTEOARTHRITIS 09/05/2007  . SLEEP APNEA, OBSTRUCTIVE 09/05/2007    ALLERGIES:  has No Known Allergies.  MEDICATIONS:  Current Outpatient Medications  Medication Sig Dispense Refill  . Artificial Tear Ointment (ARTIFICIAL TEARS) ointment Place into both eyes nightly.    [name] aspirin 162 MG EC tablet Take 1 tablet (162 mg total) by mouth daily. 100 tablet 3  . atorvastatin (LIPITOR) 20 MG tablet Take 1 tablet by mouth daily 90 tablet 3  . Cholecalciferol (VITAMIN D) 2000 units tablet Take 2,000 Units by mouth daily.    . furosemide (LASIX) 20 MG tablet Take 1 tablet (20 mg total) by mouth daily. 90 tablet 3  . metoprolol succinate (TOPROL-XL) 50 MG 24 hr tablet Take 1 tablet (50 mg total) by mouth daily. Take with or immediately following a meal. 90 tablet 1   No current facility-administered medications for this visit.     SURGICAL HISTORY:  Past Surgical History:  Procedure  Laterality Date  . EYE SURGERY  10/22/13   left eye, cataract    REVIEW OF SYSTEMS:  A comprehensive review of systems was negative.   PHYSICAL EXAMINATION: General appearance: alert, cooperative and no distress Head: Normocephalic, without obvious abnormality, atraumatic Neck: no adenopathy Lymph nodes: Cervical, supraclavicular, and axillary nodes normal. Resp: clear to auscultation bilaterally Back: symmetric, no curvature. ROM normal. No CVA tenderness. Cardio: regular rate and rhythm, S1, S2 normal, no murmur, click, rub or gallop GI: soft, non-tender; bowel sounds normal; no masses,  no organomegaly Extremities: extremities normal, atraumatic, no cyanosis or edema  ECOG PERFORMANCE STATUS: 1 - Symptomatic but completely ambulatory  Blood pressure (!) 145/59, pulse (!) 55, temperature 97.8 F (36.6 C), temperature source Oral, resp. rate 18, height 5\' 8"  (1.727 m), weight 155 lb 12.8 oz (70.7 kg), SpO2 100 %.  LABORATORY DATA: Lab Results  Component Value Date   WBC 43.8 (H) 12/23/2018   HGB 13.6 12/23/2018   HCT 42.0 12/23/2018   MCV 97.4 12/23/2018   PLT 123 (L) 12/23/2018      Chemistry      Component Value Date/Time   NA 140 06/24/2018 0922   NA 141 06/17/2017 1259   K 4.1 06/24/2018 0922   K 4.5 06/17/2017 1259   CL 105 06/24/2018 0922   CL 104 12/14/2012 1031   CO2 29 06/24/2018 0922   CO2 30 (H) 06/17/2017 1259   BUN 13 06/24/2018  5670   BUN 16.4 06/17/2017 1259   CREATININE 0.87 06/24/2018 0922   CREATININE 1.1 06/17/2017 1259      Component Value Date/Time   CALCIUM 9.3 06/24/2018 0922   CALCIUM 9.8 06/17/2017 1259   ALKPHOS 97 06/24/2018 0922   ALKPHOS 75 06/17/2017 1259   AST 21 06/24/2018 0922   AST 29 06/17/2017 1259   ALT 22 06/24/2018 0922   ALT 27 06/17/2017 1259   BILITOT 1.6 (H) 06/24/2018 0922   BILITOT 2.19 (H) 06/17/2017 1259       RADIOGRAPHIC STUDIES: No results found.  ASSESSMENT AND PLAN:  This is a very pleasant 85  years old white male with a stage 0 chronic lymphocytic leukemia. The patient is currently on observation. The patient has been doing fine with no concerning complaints. His CBC today showed further mild increase and the total white blood count. I recommended for the patient to continue on observation with repeat CBC, comprehensive metabolic panel and LDH in 6 months. He was advised to call immediately if he has any concerning symptoms in the interval. All questions were answered. The patient knows to call the clinic with any problems, questions or concerns. We can certainly see the patient much sooner if necessary. I spent 10 minutes counseling the patient face to face. The total time spent in the appointment was 15 minutes.  Disclaimer: This note was dictated with voice recognition software. Similar sounding words can inadvertently be transcribed and may not be corrected upon review.

## 2018-12-23 NOTE — Telephone Encounter (Signed)
Printed avs and calender of upcoming appointment. Per 11/5 los 

## 2019-01-01 ENCOUNTER — Ambulatory Visit (INDEPENDENT_AMBULATORY_CARE_PROVIDER_SITE_OTHER): Payer: Medicare Other | Admitting: Family

## 2019-01-01 ENCOUNTER — Encounter: Payer: Self-pay | Admitting: Family

## 2019-01-01 VITALS — BP 130/70 | HR 78 | Temp 98.5°F | Wt 156.0 lb

## 2019-01-01 DIAGNOSIS — M5430 Sciatica, unspecified side: Secondary | ICD-10-CM | POA: Diagnosis not present

## 2019-01-01 DIAGNOSIS — I10 Essential (primary) hypertension: Secondary | ICD-10-CM | POA: Diagnosis not present

## 2019-01-01 MED ORDER — METOPROLOL SUCCINATE ER 50 MG PO TB24: 50.0000 mg | ORAL_TABLET | Freq: Every day | ORAL | 1 refills | Status: DC

## 2019-01-01 MED ORDER — MELOXICAM 15 MG PO TABS: 15.0000 mg | ORAL_TABLET | Freq: Every day | ORAL | 0 refills | Status: DC

## 2019-01-01 MED ORDER — FLUTICASONE PROPIONATE 50 MCG/ACT NA SUSP: 2.0000 | Freq: Every day | NASAL | 6 refills | Status: DC

## 2019-01-01 NOTE — Progress Notes (Signed)
Jerry Gomez is a 85 y.o. male with the following history as recorded in Epic[employer]:  Patient Active Problem List   Diagnosis Date Noted  . Insomnia 10/28/2018  . Bradycardia 10/09/2016  . Sebaceous cyst 10/09/2016  . Carotid bruit 04/08/2016  . Acute sinusitis 11/01/2015  . Elevated PSA 04/10/2015  . Well adult exam 10/10/2014  . Bruising 10/10/2014  . CLL (chronic lymphocytic leukemia) ([facility]) 06/09/2012  . Pain of right lower leg 02/07/2012  . Dyslipidemia 09/05/2007  . SLEEP APNEA, OBSTRUCTIVE 09/05/2007  . OSTEOARTHRITIS 09/05/2007  . Essential hypertension 07/25/2007    Current Outpatient Medications  Medication Sig Dispense Refill  . Artificial Tear Ointment (ARTIFICIAL TEARS) ointment Place into both eyes nightly.    [name] aspirin 162 MG EC tablet Take 1 tablet (162 mg total) by mouth daily. 100 tablet 3  . atorvastatin (LIPITOR) 20 MG tablet Take 1 tablet by mouth daily 90 tablet 3  . Cholecalciferol (VITAMIN D) 2000 units tablet Take 2,000 Units by mouth daily.    . furosemide (LASIX) 20 MG tablet Take 1 tablet (20 mg total) by mouth daily. 90 tablet 3  . metoprolol succinate (TOPROL-XL) 50 MG 24 hr tablet Take 1 tablet (50 mg total) by mouth daily. Take with or immediately following a meal. 90 tablet 1  . fluticasone (FLONASE) 50 MCG/ACT nasal spray Place 2 sprays into both nostrils daily. 16 g 6  . meloxicam (MOBIC) 15 MG tablet Take 1 tablet (15 mg total) by mouth daily. 30 tablet 0   No current facility-administered medications for this visit.     Allergies: Patient has no known allergies.  Past Medical History:  Diagnosis Date  . Cancer ([facility])   . Cataract   . COLONIC POLYPS 03/02/2009  . EYE SURGERY, HX OF 09/05/2007  . HEMORRHOIDS, HX OF 09/05/2007  . HYPERLIPIDEMIA 09/05/2007  . HYPERTENSION 07/25/2007  . OSTEOARTHRITIS 09/05/2007  . SLEEP APNEA, OBSTRUCTIVE 09/05/2007    Past Surgical History:  Procedure Laterality Date  . EYE SURGERY  10/22/13   left eye,  cataract    Family History  Problem Relation Age of Onset  . Kidney disease Father   . Cancer Father        prostate-late on-set  . Cancer Brother        non-hodgkins lymphoma  . Colon cancer Neg Hx   . Esophageal cancer Neg Hx   . Rectal cancer Neg Hx   . Stomach cancer Neg Hx     Social History   Tobacco Use  . Smoking status: Former Smoker    Types: Cigarettes    Last attempt to quit: 02/07/1963    Years since quitting: 55.9  . Smokeless tobacco: Never Used  Substance Use Topics  . Alcohol use: Yes    Alcohol/week: 14.0 standard drinks    Types: 14 Standard drinks or equivalent per week    Comment: wine    Subjective:  Patient presents with concerns for right leg pain that radiates; symptoms present x 2-3 weeks; was moving some furniture in attic prior to onset of symptoms. Pain is localized in back of right thigh/ does feel radiate down into toes; no changes with bowel or bladder habits; finds that bending over will aggravate the symptoms.  Has tried using Advil with some relief;     Objective:  Vitals:   01/01/19 1509  BP: 130/70  Pulse: 78  Temp: 98.5 F (36.9 C)  TempSrc: Oral  SpO2: 95%  Weight: 156 lb (70.8  kg)    General: Well developed, well nourished, in no acute distress  Skin : Warm and dry.  Head: Normocephalic and atraumatic  Lungs: Respirations unlabored;  Musculoskeletal: No deformities; no active joint inflammation  Extremities: No edema, cyanosis, clubbing  Vessels: Symmetric bilaterally  Neurologic: Alert and oriented; speech intact; face symmetrical; moves all extremities well; CNII-XII intact without focal deficit   Assessment:  1. Sciatica, unspecified laterality   2. Essential hypertension     Plan:  1. Reassurance- do not feel imaging warranted at this time; trial of Mobic 15 mg daily; if symptoms persist x 1 week, call back. 2. Refill updated as requested.   No follow-ups on file.  No orders of the defined types were placed in  this encounter.   Requested Prescriptions   Signed Prescriptions Disp Refills  . meloxicam (MOBIC) 15 MG tablet 30 tablet 0    Sig: Take 1 tablet (15 mg total) by mouth daily.  . metoprolol succinate (TOPROL-XL) 50 MG 24 hr tablet 90 tablet 1    Sig: Take 1 tablet (50 mg total) by mouth daily. Take with or immediately following a meal.  . fluticasone (FLONASE) 50 MCG/ACT nasal spray 16 g 6    Sig: Place 2 sprays into both nostrils daily.

## 2019-01-01 NOTE — Patient Instructions (Signed)
Sciatica    Sciatica is pain, numbness, weakness, or tingling along your sciatic nerve. The sciatic nerve starts in the lower back and goes down the back of each leg. Sciatica happens when this nerve is pinched or has pressure put on it. Sciatica usually goes away on its own or with treatment. Sometimes, sciatica may keep coming back (recur).  Follow these instructions at home:  Medicines  · Take over-the-counter and prescription medicines only as told by your doctor.  · Do not drive or use heavy machinery while taking prescription pain medicine.  Managing pain  · If directed, put ice on the affected area.  ? Put ice in a plastic bag.  ? Place a towel between your skin and the bag.  ? Leave the ice on for 20 minutes, 2-3 times a day.  · After icing, apply heat to the affected area before you exercise or as often as told by your doctor. Use the heat source that your doctor tells you to use, such as a moist heat pack or a heating pad.  ? Place a towel between your skin and the heat source.  ? Leave the heat on for 20-30 minutes.  ? Remove the heat if your skin turns bright red. This is especially important if you are unable to feel pain, heat, or cold. You may have a greater risk of getting burned.  Activity  · Return to your normal activities as told by your doctor. Ask your doctor what activities are safe for you.  ? Avoid activities that make your sciatica worse.  · Take short rests during the day. Rest in a lying or standing position. This is usually better than sitting to rest.  ? When you rest for a long time, do some physical activity or stretching between periods of rest.  ? Avoid sitting for a long time without moving. Get up and move around at least one time each hour.  · Exercise and stretch regularly, as told by your doctor.  · Do not lift anything that is heavier than 10 lb (4.5 kg) while you have symptoms of sciatica.  ? Avoid lifting heavy things even when you do not have symptoms.  ? Avoid lifting  heavy things over and over.  · When you lift objects, always lift in a way that is safe for your body. To do this, you should:  ? Bend your knees.  ? Keep the object close to your body.  ? Avoid twisting.  General instructions  · Use good posture.  ? Avoid leaning forward when you are sitting.  ? Avoid hunching over when you are standing.  · Stay at a healthy weight.  · Wear comfortable shoes that support your feet. Avoid wearing high heels.  · Avoid sleeping on a mattress that is too soft or too hard. You might have less pain if you sleep on a mattress that is firm enough to support your back.  · Keep all follow-up visits as told by your doctor. This is important.  Contact a doctor if:  · You have pain that:  ? Wakes you up when you are sleeping.  ? Gets worse when you lie down.  ? Is worse than the pain you have had in the past.  ? Lasts longer than 4 weeks.  · You lose weight for without trying.  Get help right away if:  · You cannot control when you pee (urinate) or poop (have a bowel movement).  · You   have weakness in any of these areas and it gets worse.  ? Lower back.  ? Lower belly (pelvis).  ? Butt (buttocks).  ? Legs.  · You have redness or swelling of your back.  · You have a burning feeling when you pee.  This information is not intended to replace advice given to you by your health care provider. Make sure you discuss any questions you have with your health care provider.  Document Released: 09/03/2008 Document Revised: 05/02/2016 Document Reviewed: 08/04/2015  Elsevier Interactive Patient Education © 2019 [employer]

## 2019-02-09 DIAGNOSIS — Z961 Presence of intraocular lens: Secondary | ICD-10-CM | POA: Diagnosis not present

## 2019-02-09 DIAGNOSIS — H35373 Puckering of macula, bilateral: Secondary | ICD-10-CM | POA: Diagnosis not present

## 2019-02-09 DIAGNOSIS — H18421 Band keratopathy, right eye: Secondary | ICD-10-CM | POA: Diagnosis not present

## 2019-05-07 NOTE — Progress Notes (Addendum)
Subjective:   Jerry Gomez is a 85 y.o. male who presents for Medicare Annual/Subsequent preventive examination. I connected with patient by a telephone and verified that I am speaking with the correct person using two identifiers. Patient stated full name and DOB. Patient gave permission to continue with telephonic visit. Patient's location was at home and Nurse's location was at [place] office.   Review of Systems:  No ROS.  Medicare Wellness Virtual Visit.  Visual/audio telehealth visit, UTA vital signs.   See social history for additional risk factors.    Sleep patterns: feels rested on waking, gets up 1 times nightly to void and sleeps 7-8 hours nightly.    Home Safety/Smoke Alarms: Feels safe in home. Smoke alarms in place.  Living environment; residence and Firearm Safety: 2-story house. Lives with wife, no needs for DME, good support system Seat Belt Safety/Bike Helmet: Wears seat belt.   PSA-  Lab Results  Component Value Date   PSA 7.85 (H) 10/28/2018   PSA 7.38 (H) 10/27/2017   PSA 8.47 (H) 10/09/2016      Objective:    Vitals: There were no vitals taken for this visit.  There is no height or weight on file to calculate BMI.  Advanced Directives 06/24/2018 04/20/2018 04/10/2017 06/17/2016 12/18/2015 06/14/2015 12/14/2014  Does Patient Have a Medical Advance Directive? No Yes Yes No Yes Yes Yes  Type of Advance Directive - [place];Living will [place];Living will - Healthcare Power of [place];Living will  Does patient want to make changes to medical advance directive? - - - - - - No - Patient declined  Copy of [place] in Chart? - No - copy requested No - copy requested - No - copy requested - -  Would patient like information on creating a medical advance directive? - - - No - patient declined information - - -    Tobacco Social History   Tobacco Use   Smoking Status Former Smoker  . Types: Cigarettes  . Last attempt to quit: 02/07/1963  . Years since quitting: 56.2  Smokeless Tobacco Never Used     Counseling given: Not Answered  Past Medical History:  Diagnosis Date  . Cancer ([place])   . Cataract   . COLONIC POLYPS 03/02/2009  . EYE SURGERY, HX OF 09/05/2007  . HEMORRHOIDS, HX OF 09/05/2007  . HYPERLIPIDEMIA 09/05/2007  . HYPERTENSION 07/25/2007  . OSTEOARTHRITIS 09/05/2007  . SLEEP APNEA, OBSTRUCTIVE 09/05/2007   Past Surgical History:  Procedure Laterality Date  . EYE SURGERY  10/22/13   left eye, cataract   Family History  Problem Relation Age of Onset  . Kidney disease Father   . Cancer Father        prostate-late on-set  . Cancer Brother        non-hodgkins lymphoma  . Colon cancer Neg Hx   . Esophageal cancer Neg Hx   . Rectal cancer Neg Hx   . Stomach cancer Neg Hx    Social History   Socioeconomic History  . Marital status: Married    Spouse name: Not on file  . Number of children: 2  . Years of education: 103  . Highest education level: Not on file  Occupational History  . Occupation: retired  Scientific laboratory technician  . Financial resource strain: Not hard at all  . Food insecurity:    Worry: Never true    Inability: Never true  .  Transportation needs:    Medical: No    Non-medical: No  Tobacco Use  . Smoking status: Former Smoker    Types: Cigarettes    Last attempt to quit: 02/07/1963    Years since quitting: 56.2  . Smokeless tobacco: Never Used  Substance and Sexual Activity  . Alcohol use: Yes    Alcohol/week: 14.0 standard drinks    Types: 14 Standard drinks or equivalent per week    Comment: Tazia Illescas  . Drug use: No  . Sexual activity: Yes    Partners: Female  Lifestyle  . Physical activity:    Days per week: 4 days    Minutes per session: 60 min  . Stress: Only a little  Relationships  . Social connections:    Talks on phone: More than three times a week    Gets together: More than three times a  week    Attends religious service: More than 4 times per year    Active member of club or organization: Yes    Attends meetings of clubs or organizations: More than 4 times per year    Relationship status: Married  Other Topics Concern  . Not on file  Social History Narrative   HSG, [place]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 Married '54. 2 sons ' 63, '67. 2 grandchildren.   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 CPR- yes, short-term mechanical ventilation, no prolonged heroic or futile measures.     Outpatient Encounter Medications as of 05/10/2019  Medication Sig  . Artificial Tear Ointment (ARTIFICIAL TEARS) ointment Place into both eyes nightly.  [name] aspirin 162 MG EC tablet Take 1 tablet (162 mg total) by mouth daily.  [name] atorvastatin (LIPITOR) 20 MG tablet Take 1 tablet by mouth daily  . Cholecalciferol (VITAMIN D) 2000 units tablet Take 2,000 Units by mouth daily.  . fluticasone (FLONASE) 50 MCG/ACT nasal spray Place 2 sprays into both nostrils daily.  . furosemide (LASIX) 20 MG tablet Take 1 tablet (20 mg total) by mouth daily.  . meloxicam (MOBIC) 15 MG tablet Take 1 tablet (15 mg total) by mouth daily.  . metoprolol succinate (TOPROL-XL) 50 MG 24 hr tablet Take 1 tablet (50 mg total) by mouth daily. Take with or immediately following a meal.   No facility-administered encounter medications on file as of 05/10/2019.     Activities of Daily Living No flowsheet data found.  Patient Care Team: [name], [name], MD as PCP - General (Internal Medicine) Griselda Miner, MD (Dermatology) Curt Bears, MD (Hematology and Oncology)   Assessment:   This is a routine wellness examination for Suhail. Physical assessment deferred to PCP.  Exercise Activities and Dietary recommendations   Diet (meal preparation, eat out, water intake, caffeinated beverages, dairy products, fruits and vegetables): in general, a "healthy" diet  , well balanced eats a variety of fruits and  vegetables daily, limits salt, fat/cholesterol, sugar,carbohydrates,caffeine.   Encouraged patient to increase daily water and healthy fluid intake.  Goals    . Maintain current health status     Continue to be active,  eat healthy, enjoy family and life.    . Patient Stated     Stay as healthy and as independent as possible. Enjoy life and family.       Fall Risk Fall Risk  04/20/2018 04/10/2017 12/03/2016 11/30/2016 11/29/2016  Falls in the past year? No No No No No   Depression Screen PHQ 2/9 Scores 04/20/2018 04/10/2017 12/03/2016 11/30/2016  PHQ -  2 Score 0 0 0 0  PHQ- 9 Score 0 2 - -  Exception Documentation - - - -    Cognitive Function MMSE - Mini Mental State Exam 04/20/2018  Orientation to time 5  Orientation to Place 5  Registration 3  Attention/ Calculation 4  Recall 1  Language- name 2 objects 2  Language- repeat 1  Language- follow 3 step command 3  Language- read & follow direction 1  Write a sentence 1  Copy design 1  Total score 27       Ad8 score reviewed for issues:  Issues making decisions: no  Less interest in hobbies / activities: no  Repeats questions, stories (family complaining): no  Trouble using ordinary gadgets (microwave, computer, phone):no  Forgets the month or year: no  Mismanaging finances: no  Remembering appts: no  Daily problems with thinking and/or memory: no Ad8 score is= 0  Immunization History  Administered Date(s) Administered  . DTP 09/08/1998  . Influenza Split 08/28/2011, 09/08/2012  . Influenza Whole 09/07/2008, 09/02/2009, 09/06/2010  . Influenza, High Dose Seasonal PF 08/29/2016, 09/09/2017, 08/24/2018  . Influenza,inj,Quad PF,6+ Mos 08/04/2013, 09/08/2014, 09/22/2015  . Pneumococcal Conjugate-13 10/10/2014  . Pneumococcal Polysaccharide-23 09/08/2001, 01/15/2010, 10/28/2018  . Td 01/15/2010  . Zoster 05/05/2011   Screening Tests Health Maintenance  Topic Date Due  . INFLUENZA VACCINE  07/10/2019  .  TETANUS/TDAP  01/16/2020  . PNA vac Low Risk Adult  Completed      Plan:  Reviewed health maintenance screenings with patient today and relevant education, vaccines, and/or referrals were provided.   Continue to eat heart healthy diet (full of fruits, vegetables, whole grains, lean protein, water--limit salt, fat, and sugar intake) and increase physical activity as tolerated.  Continue doing brain stimulating activities (puzzles, reading, adult coloring books, staying active) to keep memory sharp.   I have personally reviewed and noted the following in the patient's chart:   . Medical and social history . Use of alcohol, tobacco or illicit drugs  . Current medications and supplements . Functional ability and status . Nutritional status . Physical activity . Advanced directives . List of other physicians . Screenings to include cognitive, depression, and falls . Referrals and appointments  In addition, I have reviewed and discussed with patient certain preventive protocols, quality metrics, and best practice recommendations. A written personalized care plan for preventive services as well as general preventive health recommendations were provided to patient.     Michiel Cowboy, RN  05/07/2019  Medical screening examination/treatment/procedure(s) were performed by non-physician practitioner and as supervising physician I was immediately available for consultation/collaboration. I agree with above. [name], MD

## 2019-05-10 ENCOUNTER — Ambulatory Visit (INDEPENDENT_AMBULATORY_CARE_PROVIDER_SITE_OTHER): Payer: Medicare Other | Admitting: *Deleted

## 2019-05-10 ENCOUNTER — Ambulatory Visit: Payer: Medicare Other | Admitting: Internal Medicine

## 2019-05-10 DIAGNOSIS — Z0000 Encounter for general adult medical examination without abnormal findings: Secondary | ICD-10-CM

## 2019-06-14 ENCOUNTER — Telehealth: Payer: Self-pay

## 2019-06-14 MED ORDER — METOPROLOL SUCCINATE ER 50 MG PO TB24: 50.0000 mg | ORAL_TABLET | Freq: Every day | ORAL | 0 refills | Status: DC

## 2019-06-14 MED ORDER — FUROSEMIDE 20 MG PO TABS: 20.0000 mg | ORAL_TABLET | Freq: Every day | ORAL | 0 refills | Status: DC

## 2019-06-14 NOTE — Telephone Encounter (Signed)
Copied from [place] [id number]. Topic: Quick Communication - Rx Refill/Question >> Jun 14, 2019 11:58 AM Parke Poisson wrote: Medication: furosemide (LASIX) 20 MG tablet  2)metoprolol succinate (TOPROL-XL) 50 MG 24 hr tablet  Has the patient contacted their pharmacy? Yes  (Agent: If yes, when and what did the pharmacy advise?)No refills  Preferred Pharmacy (with phone number or street name): [facility]) - [place], [place] [phone] (Phone) [phone] (Fax)    Agent: Please be advised that RX refills may take up to 3 business days. We ask that you follow-up with your pharmacy.

## 2019-06-14 NOTE — Telephone Encounter (Signed)
RX sent, Patient needs office visit before refills will be given

## 2019-06-17 ENCOUNTER — Ambulatory Visit: Payer: Medicare Other | Admitting: Internal Medicine

## 2019-06-23 ENCOUNTER — Other Ambulatory Visit: Payer: Medicare Other

## 2019-06-23 ENCOUNTER — Ambulatory Visit: Payer: Medicare Other | Admitting: Internal Medicine

## 2019-07-26 ENCOUNTER — Inpatient Hospital Stay: Payer: Medicare Other | Attending: Internal Medicine

## 2019-07-26 ENCOUNTER — Encounter: Payer: Self-pay | Admitting: Internal Medicine

## 2019-07-26 ENCOUNTER — Inpatient Hospital Stay (HOSPITAL_BASED_OUTPATIENT_CLINIC_OR_DEPARTMENT_OTHER): Payer: Medicare Other | Admitting: Internal Medicine

## 2019-07-26 ENCOUNTER — Telehealth: Payer: Self-pay | Admitting: Internal Medicine

## 2019-07-26 ENCOUNTER — Other Ambulatory Visit: Payer: Self-pay

## 2019-07-26 VITALS — BP 137/64 | HR 52 | Temp 98.3°F | Resp 18 | Ht 68.0 in | Wt 151.4 lb

## 2019-07-26 DIAGNOSIS — Z7982 Long term (current) use of aspirin: Secondary | ICD-10-CM | POA: Insufficient documentation

## 2019-07-26 DIAGNOSIS — E785 Hyperlipidemia, unspecified: Secondary | ICD-10-CM | POA: Insufficient documentation

## 2019-07-26 DIAGNOSIS — D696 Thrombocytopenia, unspecified: Secondary | ICD-10-CM | POA: Insufficient documentation

## 2019-07-26 DIAGNOSIS — I10 Essential (primary) hypertension: Secondary | ICD-10-CM | POA: Diagnosis not present

## 2019-07-26 DIAGNOSIS — G4733 Obstructive sleep apnea (adult) (pediatric): Secondary | ICD-10-CM | POA: Insufficient documentation

## 2019-07-26 DIAGNOSIS — C9110 Chronic lymphocytic leukemia of B-cell type not having achieved remission: Secondary | ICD-10-CM

## 2019-07-26 DIAGNOSIS — Z79899 Other long term (current) drug therapy: Secondary | ICD-10-CM | POA: Insufficient documentation

## 2019-07-26 LAB — CMP (CANCER CENTER ONLY)
ALT: 19 U/L (ref 0–44)
AST: 23 U/L (ref 15–41)
Albumin: 4.2 g/dL (ref 3.5–5.0)
Alkaline Phosphatase: 71 U/L (ref 38–126)
Anion gap: 8 (ref 5–15)
BUN: 16 mg/dL (ref 8–23)
CO2: 28 mmol/L (ref 22–32)
Calcium: 8.9 mg/dL (ref 8.9–10.3)
Chloride: 105 mmol/L (ref 98–111)
Creatinine: 0.93 mg/dL (ref 0.61–1.24)
GFR, Est AFR Am: >60 mL/min (ref >60)
GFR, Estimated: >60 mL/min (ref >60)
Glucose, Bld: 89 mg/dL (ref 70–99)
Potassium: 4.5 mmol/L (ref 3.5–5.1)
Sodium: 141 mmol/L (ref 135–145)
Total Bilirubin: 1.5 mg/dL — ABNORMAL HIGH (ref 0.3–1.2)
Total Protein: 6.3 g/dL — ABNORMAL LOW (ref 6.5–8.1)

## 2019-07-26 LAB — CBC WITH DIFFERENTIAL (CANCER CENTER ONLY)
Abs Immature Granulocytes: 0.03 10*3/uL (ref 0.00–0.07)
Basophils Absolute: 0.1 10*3/uL (ref 0.0–0.1)
Basophils Relative: 0 %
Eosinophils Absolute: 0.1 10*3/uL (ref 0.0–0.5)
Eosinophils Relative: 0 %
HCT: 39.9 % (ref 39.0–52.0)
Hemoglobin: 13 g/dL (ref 13.0–17.0)
Immature Granulocytes: 0 %
Lymphocytes Relative: 91 %
Lymphs Abs: 36.5 10*3/uL — ABNORMAL HIGH (ref 0.7–4.0)
MCH: 31.6 pg (ref 26.0–34.0)
MCHC: 32.6 g/dL (ref 30.0–36.0)
MCV: 97.1 fL (ref 80.0–100.0)
Monocytes Absolute: 1 10*3/uL (ref 0.1–1.0)
Monocytes Relative: 3 %
Neutro Abs: 2.4 10*3/uL (ref 1.7–7.7)
Neutrophils Relative %: 6 %
Platelet Count: 116 10*3/uL — ABNORMAL LOW (ref 150–400)
RBC: 4.11 MIL/uL — ABNORMAL LOW (ref 4.22–5.81)
RDW: 13.3 % (ref 11.5–15.5)
WBC Count: 40.1 10*3/uL — ABNORMAL HIGH (ref 4.0–10.5)
nRBC: 0 % (ref 0.0–0.2)

## 2019-07-26 LAB — LACTATE DEHYDROGENASE: LDH: 180 U/L (ref 98–192)

## 2019-07-26 NOTE — Progress Notes (Signed)
[place] [phone]   [phone]  OFFICE PROGRESS NOTE  [name], [name], MD [place] [address]  DIAGNOSIS: Stage 0 Chronic lymphocytic leukemia   PRIOR THERAPY: None   CURRENT THERAPY: Observation  INTERVAL HISTORY: Jerry Gomez 85 y.o. male returns to the clinic today for follow-up visit.  The patient is feeling fine today with no concerning complaints.  He denied having any weight loss or night sweats.  He has no nausea, vomiting, diarrhea or constipation.  He has no palpable lymphadenopathy.  He has no bleeding, bruises or ecchymosis.  The patient has no chest pain, shortness of breath, cough or hemoptysis.  He is here today for evaluation and repeat blood work.  MEDICAL HISTORY: Past Medical History:  Diagnosis Date  . Cancer ([place])    CLL  . Cataract   . COLONIC POLYPS 03/02/2009  . EYE SURGERY, HX OF 09/05/2007  . HEMORRHOIDS, HX OF   . HYPERLIPIDEMIA 09/05/2007  . HYPERTENSION 07/25/2007  . OSTEOARTHRITIS 09/05/2007  . SLEEP APNEA, OBSTRUCTIVE 09/05/2007    ALLERGIES:  has No Known Allergies.  MEDICATIONS:  Current Outpatient Medications  Medication Sig Dispense Refill  . Artificial Tear Ointment (ARTIFICIAL TEARS) ointment Place into both eyes nightly.    [name] aspirin 162 MG EC tablet Take 1 tablet (162 mg total) by mouth daily. (Patient taking differently: Take 81 mg by mouth daily. ) 100 tablet 3  . atorvastatin (LIPITOR) 20 MG tablet Take 1 tablet by mouth daily 90 tablet 3  . Cholecalciferol (VITAMIN D) 2000 units tablet Take 2,000 Units by mouth daily.    . furosemide (LASIX) 20 MG tablet Take 1 tablet (20 mg total) by mouth daily. 90 tablet 0  . metoprolol succinate (TOPROL-XL) 50 MG 24 hr tablet Take 1 tablet (50 mg total) by mouth daily. Take with or immediately following a meal. 90 tablet 0  . Multiple Vitamins-Minerals (CENTRUM SILVER ULTRA MENS PO) Take 1 tablet by mouth.     No current  facility-administered medications for this visit.     SURGICAL HISTORY:  Past Surgical History:  Procedure Laterality Date  . EYE SURGERY  10/22/13   left eye, cataract    REVIEW OF SYSTEMS:  A comprehensive review of systems was negative.   PHYSICAL EXAMINATION: General appearance: alert, cooperative and no distress Head: Normocephalic, without obvious abnormality, atraumatic Neck: no adenopathy Lymph nodes: Cervical, supraclavicular, and axillary nodes normal. Resp: clear to auscultation bilaterally Back: symmetric, no curvature. ROM normal. No CVA tenderness. Cardio: regular rate and rhythm, S1, S2 normal, no murmur, click, rub or gallop GI: soft, non-tender; bowel sounds normal; no masses,  no organomegaly Extremities: extremities normal, atraumatic, no cyanosis or edema  ECOG PERFORMANCE STATUS: 1 - Symptomatic but completely ambulatory  Blood pressure 137/64, pulse (!) 52, temperature 98.3 F (36.8 C), temperature source Oral, resp. rate 18, height 5\' 8"  (1.727 m), weight 151 lb 6.4 oz (68.7 kg), SpO2 100 %.  LABORATORY DATA: Lab Results  Component Value Date   WBC 40.1 (H) 07/26/2019   HGB 13.0 07/26/2019   HCT 39.9 07/26/2019   MCV 97.1 07/26/2019   PLT 116 (L) 07/26/2019      Chemistry      Component Value Date/Time   NA 141 12/23/2018 0949   NA 141 06/17/2017 1259   K 4.4 12/23/2018 0949   K 4.5 06/17/2017 1259   CL 103 12/23/2018 0949   CL 104  12/14/2012 1031   CO2 30 12/23/2018 0949   CO2 30 (H) 06/17/2017 1259   BUN 14 12/23/2018 0949   BUN 16.4 06/17/2017 1259   CREATININE 0.90 12/23/2018 0949   CREATININE 1.1 06/17/2017 1259      Component Value Date/Time   CALCIUM 9.4 12/23/2018 0949   CALCIUM 9.8 06/17/2017 1259   ALKPHOS 101 12/23/2018 0949   ALKPHOS 75 06/17/2017 1259   AST 19 12/23/2018 0949   AST 29 06/17/2017 1259   ALT 18 12/23/2018 0949   ALT 27 06/17/2017 1259   BILITOT 1.8 (H) 12/23/2018 0949   BILITOT 2.19 (H) 06/17/2017 1259        RADIOGRAPHIC STUDIES: No results found.  ASSESSMENT AND PLAN:  This is a very pleasant 85 years old white male with a stage 0 chronic lymphocytic leukemia. The patient is currently on observation. The patient is feeling fine today with no concerning complaints. Repeat CBC today showed a stable total white blood count at 40,000.  He also has mild thrombocytopenia. I recommended for him to continue on observation with repeat CBC, comprehensive metabolic panel and LDH in 6 months. He was advised to call immediately if he has any concerning symptoms in the interval. All questions were answered. The patient knows to call the clinic with any problems, questions or concerns. We can certainly see the patient much sooner if necessary. I spent 10 minutes counseling the patient face to face. The total time spent in the appointment was 15 minutes.  Disclaimer: This note was dictated with voice recognition software. Similar sounding words can inadvertently be transcribed and may not be corrected upon review.

## 2019-07-26 NOTE — Telephone Encounter (Signed)
Scheduled appt per 8/17 los - mailed letter with appt date and time 

## 2019-07-28 ENCOUNTER — Encounter: Payer: Self-pay | Admitting: Internal Medicine

## 2019-07-28 ENCOUNTER — Other Ambulatory Visit: Payer: Self-pay

## 2019-07-28 ENCOUNTER — Ambulatory Visit (INDEPENDENT_AMBULATORY_CARE_PROVIDER_SITE_OTHER): Payer: Medicare Other | Admitting: Internal Medicine

## 2019-07-28 VITALS — BP 120/64 | HR 68 | Temp 98.5°F | Ht 68.0 in | Wt 151.0 lb

## 2019-07-28 DIAGNOSIS — I10 Essential (primary) hypertension: Secondary | ICD-10-CM | POA: Diagnosis not present

## 2019-07-28 DIAGNOSIS — R972 Elevated prostate specific antigen [PSA]: Secondary | ICD-10-CM | POA: Diagnosis not present

## 2019-07-28 DIAGNOSIS — C9110 Chronic lymphocytic leukemia of B-cell type not having achieved remission: Secondary | ICD-10-CM

## 2019-07-28 DIAGNOSIS — Z23 Encounter for immunization: Secondary | ICD-10-CM | POA: Diagnosis not present

## 2019-07-28 DIAGNOSIS — E785 Hyperlipidemia, unspecified: Secondary | ICD-10-CM

## 2019-07-28 NOTE — Assessment & Plan Note (Signed)
Just had a f/u by Dr. Julien Nordmann

## 2019-07-28 NOTE — Patient Instructions (Addendum)
If you have medicare related insurance (such as traditional Medicare, Blue [employer], [employer], or similar), Please make an appointment at the scheduling desk with Sharee Pimple, the [employer], for your Wellness visit in this office, which is a benefit with your insurance.  Valerian root for insomnia

## 2019-07-28 NOTE — Assessment & Plan Note (Signed)
Lipitor 

## 2019-07-28 NOTE — Progress Notes (Signed)
Subjective:  Patient ID: Jerry Gomez, male    DOB: August 07, 1931  Age: 85 y.o. MRN: [id number]  CC: No chief complaint on file.   HPI SHAKUR LEMBO presents for HTN, dyslipidemia, CLL f/u  Outpatient Medications Prior to Visit  Medication Sig Dispense Refill  . Artificial Tear Ointment (ARTIFICIAL TEARS) ointment Place into both eyes nightly.    [name] aspirin 162 MG EC tablet Take 1 tablet (162 mg total) by mouth daily. (Patient taking differently: Take 81 mg by mouth daily. ) 100 tablet 3  . atorvastatin (LIPITOR) 20 MG tablet Take 1 tablet by mouth daily 90 tablet 3  . Cholecalciferol (VITAMIN D) 2000 units tablet Take 2,000 Units by mouth daily.    . furosemide (LASIX) 20 MG tablet Take 1 tablet (20 mg total) by mouth daily. 90 tablet 0  . metoprolol succinate (TOPROL-XL) 50 MG 24 hr tablet Take 1 tablet (50 mg total) by mouth daily. Take with or immediately following a meal. 90 tablet 0  . Multiple Vitamins-Minerals (CENTRUM SILVER ULTRA MENS PO) Take 1 tablet by mouth.     No facility-administered medications prior to visit.     ROS: Review of Systems  Constitutional: Negative for appetite change, fatigue and unexpected weight change.  HENT: Negative for congestion, nosebleeds, sneezing, sore throat and trouble swallowing.   Eyes: Negative for itching and visual disturbance.  Respiratory: Negative for cough.   Cardiovascular: Negative for chest pain, palpitations and leg swelling.  Gastrointestinal: Negative for abdominal distention, blood in stool, diarrhea and nausea.  Genitourinary: Negative for frequency and hematuria.  Musculoskeletal: Negative for back pain, gait problem, joint swelling and neck pain.  Skin: Negative for rash.  Neurological: Negative for dizziness, tremors, speech difficulty and weakness.  Psychiatric/Behavioral: Negative for agitation, dysphoric mood, sleep disturbance and suicidal ideas. The patient is not nervous/anxious.     Objective:  BP 120/64 (BP  Location: Left Arm, Patient Position: Sitting, Cuff Size: Normal)   Pulse 68   Temp 98.5 F (36.9 C) (Oral)   Ht 5\' 8"  (1.727 m)   Wt 151 lb (68.5 kg)   SpO2 98%   BMI 22.96 kg/m   BP Readings from Last 3 Encounters:  07/28/19 120/64  07/26/19 137/64  01/01/19 130/70    Wt Readings from Last 3 Encounters:  07/28/19 151 lb (68.5 kg)  07/26/19 151 lb 6.4 oz (68.7 kg)  01/01/19 156 lb (70.8 kg)    Physical Exam Constitutional:      General: He is not in acute distress.    Appearance: He is well-developed.     Comments: NAD  Eyes:     Conjunctiva/sclera: Conjunctivae normal.     Pupils: Pupils are equal, round, and reactive to light.  Neck:     Musculoskeletal: Normal range of motion.     Thyroid: No thyromegaly.     Vascular: No JVD.  Cardiovascular:     Rate and Rhythm: Normal rate and regular rhythm.     Heart sounds: Normal heart sounds. No murmur. No friction rub. No gallop.   Pulmonary:     Effort: Pulmonary effort is normal. No respiratory distress.     Breath sounds: Normal breath sounds. No wheezing or rales.  Chest:     Chest wall: No tenderness.  Abdominal:     General: Bowel sounds are normal. There is no distension.     Palpations: Abdomen is soft. There is no mass.     Tenderness: There is no abdominal  tenderness. There is no guarding or rebound.  Musculoskeletal: Normal range of motion.        General: No tenderness.  Lymphadenopathy:     Cervical: No cervical adenopathy.  Skin:    General: Skin is warm and dry.     Findings: No rash.  Neurological:     Mental Status: He is alert and oriented to person, place, and time.     Cranial Nerves: No cranial nerve deficit.     Motor: No abnormal muscle tone.     Coordination: Coordination normal.     Gait: Gait normal.     Deep Tendon Reflexes: Reflexes are normal and symmetric.  Psychiatric:        Behavior: Behavior normal.        Thought Content: Thought content normal.        Judgment: Judgment  normal.     Lab Results  Component Value Date   WBC 40.1 (H) 07/26/2019   HGB 13.0 07/26/2019   HCT 39.9 07/26/2019   PLT 116 (L) 07/26/2019   GLUCOSE 89 07/26/2019   CHOL 126 10/28/2018   TRIG 43.0 10/28/2018   HDL 52.60 10/28/2018   LDLCALC 65 10/28/2018   ALT 19 07/26/2019   AST 23 07/26/2019   NA 141 07/26/2019   K 4.5 07/26/2019   CL 105 07/26/2019   CREATININE 0.93 07/26/2019   BUN 16 07/26/2019   CO2 28 07/26/2019   TSH 1.54 10/28/2018   PSA 7.85 (H) 10/28/2018    No results found.  Assessment & Plan:   Diagnoses and all orders for this visit:  Need for influenza vaccination -     Flu Vaccine QUAD High Dose(Fluad)     No orders of the defined types were placed in this encounter.    Follow-up: No follow-ups on file.  Walker Kehr, MD

## 2019-07-28 NOTE — Assessment & Plan Note (Signed)
Metoprolol, furosemide 

## 2019-07-28 NOTE — Assessment & Plan Note (Signed)
PSA

## 2019-08-02 DIAGNOSIS — H35373 Puckering of macula, bilateral: Secondary | ICD-10-CM | POA: Diagnosis not present

## 2019-08-02 DIAGNOSIS — Z961 Presence of intraocular lens: Secondary | ICD-10-CM | POA: Diagnosis not present

## 2019-08-02 DIAGNOSIS — H18421 Band keratopathy, right eye: Secondary | ICD-10-CM | POA: Diagnosis not present

## 2019-08-02 DIAGNOSIS — Z9842 Cataract extraction status, left eye: Secondary | ICD-10-CM | POA: Diagnosis not present

## 2019-08-05 ENCOUNTER — Other Ambulatory Visit (INDEPENDENT_AMBULATORY_CARE_PROVIDER_SITE_OTHER): Payer: Medicare Other

## 2019-08-05 DIAGNOSIS — C9110 Chronic lymphocytic leukemia of B-cell type not having achieved remission: Secondary | ICD-10-CM | POA: Diagnosis not present

## 2019-08-05 DIAGNOSIS — R972 Elevated prostate specific antigen [PSA]: Secondary | ICD-10-CM

## 2019-08-05 DIAGNOSIS — E785 Hyperlipidemia, unspecified: Secondary | ICD-10-CM

## 2019-08-05 DIAGNOSIS — I10 Essential (primary) hypertension: Secondary | ICD-10-CM

## 2019-08-05 LAB — BASIC METABOLIC PANEL
BUN: 17 mg/dL (ref 6–23)
CO2: 30 mEq/L (ref 19–32)
Calcium: 9 mg/dL (ref 8.4–10.5)
Chloride: 104 mEq/L (ref 96–112)
Creatinine, Ser: 0.94 mg/dL (ref 0.40–1.50)
GFR: 76.08 mL/min (ref >60.00)
Glucose, Bld: 88 mg/dL (ref 70–99)
Potassium: 4 mEq/L (ref 3.5–5.1)
Sodium: 142 mEq/L (ref 135–145)

## 2019-08-05 LAB — URINALYSIS
Bilirubin Urine: NEGATIVE
Hgb urine dipstick: NEGATIVE
Ketones, ur: NEGATIVE
Leukocytes,Ua: NEGATIVE
Nitrite: NEGATIVE
Specific Gravity, Urine: 1.01 (ref 1.000–1.030)
Total Protein, Urine: NEGATIVE
Urine Glucose: NEGATIVE
Urobilinogen, UA: 0.2 (ref 0.0–1.0)
pH: 6 (ref 5.0–8.0)

## 2019-08-05 LAB — PSA: PSA: 6.97 ng/mL — ABNORMAL HIGH (ref 0.10–4.00)

## 2019-08-05 LAB — LIPID PANEL
Cholesterol: 130 mg/dL (ref 0–200)
HDL: 57 mg/dL (ref >39.00)
LDL Cholesterol: 66 mg/dL (ref 0–99)
NonHDL: 73.48
Total CHOL/HDL Ratio: 2
Triglycerides: 38 mg/dL (ref 0.0–149.0)
VLDL: 7.6 mg/dL (ref 0.0–40.0)

## 2019-08-05 LAB — HEPATIC FUNCTION PANEL
ALT: 17 U/L (ref 0–53)
AST: 21 U/L (ref 0–37)
Albumin: 4.4 g/dL (ref 3.5–5.2)
Alkaline Phosphatase: 69 U/L (ref 39–117)
Bilirubin, Direct: 0.3 mg/dL (ref 0.0–0.3)
Total Bilirubin: 1.3 mg/dL — ABNORMAL HIGH (ref 0.2–1.2)
Total Protein: 6.2 g/dL (ref 6.0–8.3)

## 2019-08-05 LAB — TSH: TSH: 2.46 u[IU]/mL (ref 0.35–4.50)

## 2019-10-15 ENCOUNTER — Other Ambulatory Visit: Payer: Self-pay | Admitting: Internal Medicine

## 2019-10-15 MED ORDER — METOPROLOL SUCCINATE ER 50 MG PO TB24: 50.0000 mg | ORAL_TABLET | Freq: Every day | ORAL | 0 refills | Status: DC

## 2019-10-15 MED ORDER — FUROSEMIDE 20 MG PO TABS: 20.0000 mg | ORAL_TABLET | Freq: Every day | ORAL | 0 refills | Status: DC

## 2019-10-15 NOTE — Telephone Encounter (Signed)
Medication Refill - Medication:  furosemide (LASIX) 20 MG tablet    metoprolol succinate (TOPROL-XL) 50 MG 24 hr tablet      Preferred Pharmacy (with phone number or street name):  [facility] - [place], [place] [phone] (Phone) [phone] (Fax)     Agent: Please be advised that RX refills may take up to 3 business days. We ask that you follow-up with your pharmacy.

## 2019-10-26 DIAGNOSIS — D225 Melanocytic nevi of trunk: Secondary | ICD-10-CM | POA: Diagnosis not present

## 2019-10-26 DIAGNOSIS — D692 Other nonthrombocytopenic purpura: Secondary | ICD-10-CM | POA: Diagnosis not present

## 2019-10-26 DIAGNOSIS — Z85828 Personal history of other malignant neoplasm of skin: Secondary | ICD-10-CM | POA: Diagnosis not present

## 2019-10-26 DIAGNOSIS — L821 Other seborrheic keratosis: Secondary | ICD-10-CM | POA: Diagnosis not present

## 2019-10-26 DIAGNOSIS — L814 Other melanin hyperpigmentation: Secondary | ICD-10-CM | POA: Diagnosis not present

## 2019-10-26 DIAGNOSIS — L570 Actinic keratosis: Secondary | ICD-10-CM | POA: Diagnosis not present

## 2019-10-26 DIAGNOSIS — L720 Epidermal cyst: Secondary | ICD-10-CM | POA: Diagnosis not present

## 2019-10-26 DIAGNOSIS — D1801 Hemangioma of skin and subcutaneous tissue: Secondary | ICD-10-CM | POA: Diagnosis not present

## 2020-01-10 ENCOUNTER — Telehealth: Payer: Self-pay | Admitting: Internal Medicine

## 2020-01-10 MED ORDER — ATORVASTATIN CALCIUM 20 MG PO TABS: 20.0000 mg | ORAL_TABLET | Freq: Every day | ORAL | 1 refills | Status: DC

## 2020-01-10 MED ORDER — METOPROLOL SUCCINATE ER 50 MG PO TB24: 50.0000 mg | ORAL_TABLET | Freq: Every day | ORAL | 1 refills | Status: DC

## 2020-01-10 MED ORDER — FUROSEMIDE 20 MG PO TABS: 20.0000 mg | ORAL_TABLET | Freq: Every day | ORAL | 1 refills | Status: DC

## 2020-01-10 NOTE — Telephone Encounter (Signed)
Patient is requesting lipitor, lasix and toprol -xl to be sent to [facility] on [address].   Patient states has new RX coverage with [employer]. He states he will give the pharmacy this info.

## 2020-01-10 NOTE — Telephone Encounter (Signed)
Notified pt new rx sent to [facility]. Updated pharmacy.Marland KitchenJohny Chess

## 2020-01-26 ENCOUNTER — Inpatient Hospital Stay: Payer: Medicare Other | Attending: Internal Medicine | Admitting: Internal Medicine

## 2020-01-26 ENCOUNTER — Encounter: Payer: Self-pay | Admitting: Internal Medicine

## 2020-01-26 ENCOUNTER — Other Ambulatory Visit: Payer: Self-pay

## 2020-01-26 ENCOUNTER — Inpatient Hospital Stay: Payer: Medicare Other

## 2020-01-26 VITALS — BP 133/52 | HR 60 | Temp 97.8°F | Resp 17 | Ht 68.0 in | Wt 155.1 lb

## 2020-01-26 DIAGNOSIS — Z7982 Long term (current) use of aspirin: Secondary | ICD-10-CM | POA: Insufficient documentation

## 2020-01-26 DIAGNOSIS — E785 Hyperlipidemia, unspecified: Secondary | ICD-10-CM | POA: Insufficient documentation

## 2020-01-26 DIAGNOSIS — D696 Thrombocytopenia, unspecified: Secondary | ICD-10-CM | POA: Insufficient documentation

## 2020-01-26 DIAGNOSIS — Z79899 Other long term (current) drug therapy: Secondary | ICD-10-CM | POA: Diagnosis not present

## 2020-01-26 DIAGNOSIS — I10 Essential (primary) hypertension: Secondary | ICD-10-CM

## 2020-01-26 DIAGNOSIS — C9110 Chronic lymphocytic leukemia of B-cell type not having achieved remission: Secondary | ICD-10-CM | POA: Insufficient documentation

## 2020-01-26 DIAGNOSIS — G4733 Obstructive sleep apnea (adult) (pediatric): Secondary | ICD-10-CM | POA: Diagnosis not present

## 2020-01-26 LAB — LACTATE DEHYDROGENASE: LDH: 186 U/L (ref 98–192)

## 2020-01-26 LAB — CBC WITH DIFFERENTIAL (CANCER CENTER ONLY)
Abs Immature Granulocytes: 0.03 10*3/uL (ref 0.00–0.07)
Basophils Absolute: 0.1 10*3/uL (ref 0.0–0.1)
Basophils Relative: 0 %
Eosinophils Absolute: 0.2 10*3/uL (ref 0.0–0.5)
Eosinophils Relative: 0 %
HCT: 39.4 % (ref 39.0–52.0)
Hemoglobin: 12.9 g/dL — ABNORMAL LOW (ref 13.0–17.0)
Immature Granulocytes: 0 %
Lymphocytes Relative: 89 %
Lymphs Abs: 34.8 10*3/uL — ABNORMAL HIGH (ref 0.7–4.0)
MCH: 32.3 pg (ref 26.0–34.0)
MCHC: 32.7 g/dL (ref 30.0–36.0)
MCV: 98.7 fL (ref 80.0–100.0)
Monocytes Absolute: 1.9 10*3/uL — ABNORMAL HIGH (ref 0.1–1.0)
Monocytes Relative: 5 %
Neutro Abs: 2.2 10*3/uL (ref 1.7–7.7)
Neutrophils Relative %: 6 %
Platelet Count: 124 10*3/uL — ABNORMAL LOW (ref 150–400)
RBC: 3.99 MIL/uL — ABNORMAL LOW (ref 4.22–5.81)
RDW: 12.9 % (ref 11.5–15.5)
WBC Count: 39.3 10*3/uL — ABNORMAL HIGH (ref 4.0–10.5)
nRBC: 0 % (ref 0.0–0.2)

## 2020-01-26 LAB — CMP (CANCER CENTER ONLY)
ALT: 17 U/L (ref 0–44)
AST: 19 U/L (ref 15–41)
Albumin: 4.1 g/dL (ref 3.5–5.0)
Alkaline Phosphatase: 83 U/L (ref 38–126)
Anion gap: 9 (ref 5–15)
BUN: 20 mg/dL (ref 8–23)
CO2: 29 mmol/L (ref 22–32)
Calcium: 8.9 mg/dL (ref 8.9–10.3)
Chloride: 104 mmol/L (ref 98–111)
Creatinine: 1.02 mg/dL (ref 0.61–1.24)
GFR, Est AFR Am: >60 mL/min (ref >60)
GFR, Estimated: >60 mL/min (ref >60)
Glucose, Bld: 91 mg/dL (ref 70–99)
Potassium: 4.3 mmol/L (ref 3.5–5.1)
Sodium: 142 mmol/L (ref 135–145)
Total Bilirubin: 1.4 mg/dL — ABNORMAL HIGH (ref 0.3–1.2)
Total Protein: 6.4 g/dL — ABNORMAL LOW (ref 6.5–8.1)

## 2020-01-26 NOTE — Progress Notes (Signed)
[place] Telephone:([phone]   Fax:(336) [phone]  OFFICE PROGRESS NOTE  Gomez, Jerry Lacks, MD [place] [address]  DIAGNOSIS: Stage 0 Chronic lymphocytic leukemia   PRIOR THERAPY: None   CURRENT THERAPY: Observation  INTERVAL HISTORY: Jerry Gomez 86 y.o. male returns to the clinic today for 6 months follow-up visit.  The patient is feeling fine today with no concerning complaints.  He received his Covid vaccines and did well.  He denied having any current chest pain, shortness of breath, cough or hemoptysis.  He denied having any fever or chills.  He has no nausea, vomiting, diarrhea or constipation.  He has no recent weight loss or night sweats.  The patient is here today for evaluation and repeat blood work.  MEDICAL HISTORY: Past Medical History:  Diagnosis Date  . Cancer ([place])    CLL  . Cataract   . COLONIC POLYPS 03/02/2009  . EYE SURGERY, HX OF 09/05/2007  . HEMORRHOIDS, HX OF   . HYPERLIPIDEMIA 09/05/2007  . HYPERTENSION 07/25/2007  . OSTEOARTHRITIS 09/05/2007  . SLEEP APNEA, OBSTRUCTIVE 09/05/2007    ALLERGIES:  has No Known Allergies.  MEDICATIONS:  Current Outpatient Medications  Medication Sig Dispense Refill  . Artificial Tear Ointment (ARTIFICIAL TEARS) ointment Place into both eyes nightly.    [name] aspirin 162 MG EC tablet Take 1 tablet (162 mg total) by mouth daily. (Patient taking differently: Take 81 mg by mouth daily. ) 100 tablet 3  . atorvastatin (LIPITOR) 20 MG tablet Take 1 tablet (20 mg total) by mouth daily. 90 tablet 1  . Cholecalciferol (VITAMIN D) 2000 units tablet Take 2,000 Units by mouth daily.    . furosemide (LASIX) 20 MG tablet Take 1 tablet (20 mg total) by mouth daily. 90 tablet 1  . metoprolol succinate (TOPROL-XL) 50 MG 24 hr tablet Take 1 tablet (50 mg total) by mouth daily. Take with or immediately following a meal. 90 tablet 1  . Multiple Vitamins-Minerals (CENTRUM SILVER ULTRA MENS PO)  Take 1 tablet by mouth.     No current facility-administered medications for this visit.    SURGICAL HISTORY:  Past Surgical History:  Procedure Laterality Date  . EYE SURGERY  10/22/13   left eye, cataract    REVIEW OF SYSTEMS:  A comprehensive review of systems was negative.   PHYSICAL EXAMINATION: General appearance: alert, cooperative and no distress Head: Normocephalic, without obvious abnormality, atraumatic Neck: no adenopathy Lymph nodes: Cervical, supraclavicular, and axillary nodes normal. Resp: clear to auscultation bilaterally Back: symmetric, no curvature. ROM normal. No CVA tenderness. Cardio: regular rate and rhythm, S1, S2 normal, no murmur, click, rub or gallop GI: soft, non-tender; bowel sounds normal; no masses,  no organomegaly Extremities: extremities normal, atraumatic, no cyanosis or edema  ECOG PERFORMANCE STATUS: 1 - Symptomatic but completely ambulatory  Blood pressure (!) 133/52, pulse 60, temperature 97.8 F (36.6 C), temperature source Temporal, resp. rate 17, height 5' 8"  (1.727 m), weight 155 lb 1.6 oz (70.4 kg), SpO2 99 %.  LABORATORY DATA: Lab Results  Component Value Date   WBC 39.3 (H) 01/26/2020   HGB 12.9 (L) 01/26/2020   HCT 39.4 01/26/2020   MCV 98.7 01/26/2020   PLT 124 (L) 01/26/2020      Chemistry      Component Value Date/Time   NA 142 08/05/2019 0736   NA 141 06/17/2017 1259   K 4.0 08/05/2019 0736   K 4.5 06/17/2017 1259  CL 104 08/05/2019 0736   CL 104 12/14/2012 1031   CO2 30 08/05/2019 0736   CO2 30 (H) 06/17/2017 1259   BUN 17 08/05/2019 0736   BUN 16.4 06/17/2017 1259   CREATININE 0.94 08/05/2019 0736   CREATININE 0.93 07/26/2019 1110   CREATININE 1.1 06/17/2017 1259      Component Value Date/Time   CALCIUM 9.0 08/05/2019 0736   CALCIUM 9.8 06/17/2017 1259   ALKPHOS 69 08/05/2019 0736   ALKPHOS 75 06/17/2017 1259   AST 21 08/05/2019 0736   AST 23 07/26/2019 1110   AST 29 06/17/2017 1259   ALT 17  08/05/2019 0736   ALT 19 07/26/2019 1110   ALT 27 06/17/2017 1259   BILITOT 1.3 (H) 08/05/2019 0736   BILITOT 1.5 (H) 07/26/2019 1110   BILITOT 2.19 (H) 06/17/2017 1259       RADIOGRAPHIC STUDIES: No results found.  ASSESSMENT AND PLAN:  This is a very pleasant 86 years old white male with a stage 0 chronic lymphocytic leukemia. The patient is currently on observation. The patient has no concerning complaints today. CBC today showed elevated but stable white blood count.  He also has mild thrombocytopenia. I recommended for the patient to continue on observation with repeat CBC, c-Met and LDH in 6 months. He was advised to call immediately if he has any concerning symptoms in the interval. All questions were answered. The patient knows to call the clinic with any problems, questions or concerns. We can certainly see the patient much sooner if necessary.  Disclaimer: This note was dictated with voice recognition software. Similar sounding words can inadvertently be transcribed and may not be corrected upon review.

## 2020-01-28 ENCOUNTER — Telehealth: Payer: Self-pay | Admitting: Internal Medicine

## 2020-01-28 NOTE — Telephone Encounter (Signed)
Scheduled per los. Called and left msg. Mailed printout  °

## 2020-01-31 ENCOUNTER — Ambulatory Visit (INDEPENDENT_AMBULATORY_CARE_PROVIDER_SITE_OTHER): Payer: Medicare Other | Admitting: Internal Medicine

## 2020-01-31 ENCOUNTER — Other Ambulatory Visit: Payer: Self-pay

## 2020-01-31 ENCOUNTER — Encounter: Payer: Self-pay | Admitting: Internal Medicine

## 2020-01-31 DIAGNOSIS — C9110 Chronic lymphocytic leukemia of B-cell type not having achieved remission: Secondary | ICD-10-CM

## 2020-01-31 DIAGNOSIS — I10 Essential (primary) hypertension: Secondary | ICD-10-CM | POA: Diagnosis not present

## 2020-01-31 DIAGNOSIS — R972 Elevated prostate specific antigen [PSA]: Secondary | ICD-10-CM

## 2020-01-31 DIAGNOSIS — E785 Hyperlipidemia, unspecified: Secondary | ICD-10-CM

## 2020-01-31 NOTE — Assessment & Plan Note (Signed)
Atorvastatin

## 2020-01-31 NOTE — Progress Notes (Signed)
Subjective:  Patient ID: Jerry Gomez, male    DOB: Apr 25, 1931  Age: 86 y.o. MRN: [id number]  CC: No chief complaint on file.   HPI ANTAEUS RIDEOUT presents for HTN, dyslipidemia, CLL f/u  Outpatient Medications Prior to Visit  Medication Sig Dispense Refill   Artificial Tear Ointment (ARTIFICIAL TEARS) ointment Place into both eyes nightly.     aspirin 162 MG EC tablet Take 1 tablet (162 mg total) by mouth daily. (Patient taking differently: Take 81 mg by mouth daily. ) 100 tablet 3   atorvastatin (LIPITOR) 20 MG tablet Take 1 tablet (20 mg total) by mouth daily. 90 tablet 1   Cholecalciferol (VITAMIN D) 2000 units tablet Take 2,000 Units by mouth daily.     furosemide (LASIX) 20 MG tablet Take 1 tablet (20 mg total) by mouth daily. 90 tablet 1   metoprolol succinate (TOPROL-XL) 50 MG 24 hr tablet Take 1 tablet (50 mg total) by mouth daily. Take with or immediately following a meal. 90 tablet 1   Multiple Vitamins-Minerals (CENTRUM SILVER ULTRA MENS PO) Take 1 tablet by mouth.     No facility-administered medications prior to visit.    ROS: Review of Systems  Constitutional: Negative for appetite change, fatigue and unexpected weight change.  HENT: Negative for congestion, nosebleeds, sneezing, sore throat and trouble swallowing.   Eyes: Negative for itching and visual disturbance.  Respiratory: Negative for cough.   Cardiovascular: Negative for chest pain, palpitations and leg swelling.  Gastrointestinal: Negative for abdominal distention, blood in stool, diarrhea and nausea.  Genitourinary: Negative for frequency and hematuria.  Musculoskeletal: Negative for back pain, gait problem, joint swelling and neck pain.  Skin: Negative for rash.  Neurological: Negative for dizziness, tremors, speech difficulty and weakness.  Psychiatric/Behavioral: Negative for agitation, dysphoric mood, sleep disturbance and suicidal ideas. The patient is not nervous/anxious.     Objective:  BP  128/62 (BP Location: Left Arm, Patient Position: Sitting, Cuff Size: Normal)   Pulse (!) 50   Temp 97.8 F (36.6 C) (Oral)   Ht 5\' 8"  (1.727 m)   Wt 155 lb (70.3 kg)   SpO2 99%   BMI 23.57 kg/m   BP Readings from Last 3 Encounters:  01/31/20 128/62  01/26/20 (!) 133/52  07/28/19 120/64    Wt Readings from Last 3 Encounters:  01/31/20 155 lb (70.3 kg)  01/26/20 155 lb 1.6 oz (70.4 kg)  07/28/19 151 lb (68.5 kg)    Physical Exam Constitutional:      General: He is not in acute distress.    Appearance: He is well-developed.     Comments: NAD  Eyes:     Conjunctiva/sclera: Conjunctivae normal.     Pupils: Pupils are equal, round, and reactive to light.  Neck:     Thyroid: No thyromegaly.     Vascular: No JVD.  Cardiovascular:     Rate and Rhythm: Normal rate and regular rhythm.     Heart sounds: Normal heart sounds. No murmur. No friction rub. No gallop.   Pulmonary:     Effort: Pulmonary effort is normal. No respiratory distress.     Breath sounds: Normal breath sounds. No wheezing or rales.  Chest:     Chest wall: No tenderness.  Abdominal:     General: Bowel sounds are normal. There is no distension.     Palpations: Abdomen is soft. There is no mass.     Tenderness: There is no abdominal tenderness. There is no guarding  or rebound.  Musculoskeletal:        General: No tenderness. Normal range of motion.     Cervical back: Normal range of motion.  Lymphadenopathy:     Cervical: No cervical adenopathy.  Skin:    General: Skin is warm and dry.     Findings: No rash.  Neurological:     Mental Status: He is alert and oriented to person, place, and time.     Cranial Nerves: No cranial nerve deficit.     Motor: No abnormal muscle tone.     Coordination: Coordination normal.     Gait: Gait normal.     Deep Tendon Reflexes: Reflexes are normal and symmetric.  Psychiatric:        Behavior: Behavior normal.        Thought Content: Thought content normal.         Judgment: Judgment normal.     Lab Results  Component Value Date   WBC 39.3 (H) 01/26/2020   HGB 12.9 (L) 01/26/2020   HCT 39.4 01/26/2020   PLT 124 (L) 01/26/2020   GLUCOSE 91 01/26/2020   CHOL 130 08/05/2019   TRIG 38.0 08/05/2019   HDL 57.00 08/05/2019   LDLCALC 66 08/05/2019   ALT 17 01/26/2020   AST 19 01/26/2020   NA 142 01/26/2020   K 4.3 01/26/2020   CL 104 01/26/2020   CREATININE 1.02 01/26/2020   BUN 20 01/26/2020   CO2 29 01/26/2020   TSH 2.46 08/05/2019   PSA 6.97 (H) 08/05/2019    No results found.  Assessment & Plan:    Walker Kehr, MD

## 2020-01-31 NOTE — Assessment & Plan Note (Signed)
CBC

## 2020-01-31 NOTE — Assessment & Plan Note (Signed)
PSA

## 2020-01-31 NOTE — Assessment & Plan Note (Signed)
Metoprolol, furosemide 

## 2020-02-10 ENCOUNTER — Telehealth: Payer: Self-pay | Admitting: Internal Medicine

## 2020-02-10 NOTE — Telephone Encounter (Signed)
New Message:   Pt would like to know if it is safe to be around there son since he had covid about a month ago and they have had both vaccinations. Please advise.

## 2020-02-11 NOTE — Telephone Encounter (Signed)
Yes.  It is safe.  Thanks

## 2020-02-11 NOTE — Telephone Encounter (Signed)
Left message for patient today with recommendation from Dr. Alain Marion.

## 2020-07-03 ENCOUNTER — Other Ambulatory Visit: Payer: Self-pay | Admitting: Internal Medicine

## 2020-07-17 ENCOUNTER — Telehealth: Payer: Self-pay | Admitting: Internal Medicine

## 2020-07-17 ENCOUNTER — Other Ambulatory Visit: Payer: Self-pay

## 2020-07-17 MED ORDER — ATORVASTATIN CALCIUM 20 MG PO TABS: 20.0000 mg | ORAL_TABLET | Freq: Every day | ORAL | 1 refills | Status: DC

## 2020-07-17 MED ORDER — METOPROLOL SUCCINATE ER 50 MG PO TB24: ORAL_TABLET | ORAL | 1 refills | Status: DC

## 2020-07-17 MED ORDER — FUROSEMIDE 20 MG PO TABS: 20.0000 mg | ORAL_TABLET | Freq: Every day | ORAL | 1 refills | Status: DC

## 2020-07-17 NOTE — Telephone Encounter (Signed)
New message:   1.Medication Requested: atorvastatin (LIPITOR) 20 MG tablet furosemide (LASIX) 20 MG tablet metoprolol succinate (TOPROL-XL) 50 MG 24 hr tablet 2. Pharmacy (Name, [place]): [id number] - [place], [address] AT [address] [place] 3. On Med List: Yes  4. Last Visit with PCP: 01/31/20  5. Next visit date with PCP: 08/01/20   Agent: Please be advised that RX refills may take up to 3 business days. We ask that you follow-up with your pharmacy.

## 2020-07-25 ENCOUNTER — Encounter: Payer: Self-pay | Admitting: Internal Medicine

## 2020-07-25 ENCOUNTER — Other Ambulatory Visit: Payer: Self-pay

## 2020-07-25 ENCOUNTER — Inpatient Hospital Stay: Payer: Medicare Other | Attending: Internal Medicine | Admitting: Internal Medicine

## 2020-07-25 ENCOUNTER — Telehealth: Payer: Self-pay | Admitting: Internal Medicine

## 2020-07-25 ENCOUNTER — Inpatient Hospital Stay: Payer: Medicare Other

## 2020-07-25 VITALS — BP 130/55 | HR 66 | Temp 98.1°F | Resp 17 | Ht 68.0 in | Wt 162.0 lb

## 2020-07-25 DIAGNOSIS — C9110 Chronic lymphocytic leukemia of B-cell type not having achieved remission: Secondary | ICD-10-CM

## 2020-07-25 DIAGNOSIS — E785 Hyperlipidemia, unspecified: Secondary | ICD-10-CM | POA: Insufficient documentation

## 2020-07-25 DIAGNOSIS — Z7982 Long term (current) use of aspirin: Secondary | ICD-10-CM | POA: Insufficient documentation

## 2020-07-25 DIAGNOSIS — Z79899 Other long term (current) drug therapy: Secondary | ICD-10-CM | POA: Diagnosis not present

## 2020-07-25 DIAGNOSIS — I10 Essential (primary) hypertension: Secondary | ICD-10-CM | POA: Diagnosis not present

## 2020-07-25 DIAGNOSIS — G4733 Obstructive sleep apnea (adult) (pediatric): Secondary | ICD-10-CM | POA: Insufficient documentation

## 2020-07-25 LAB — CBC WITH DIFFERENTIAL (CANCER CENTER ONLY)
Abs Immature Granulocytes: 0.04 10*3/uL (ref 0.00–0.07)
Basophils Absolute: 0.1 10*3/uL (ref 0.0–0.1)
Basophils Relative: 0 %
Eosinophils Absolute: 0.1 10*3/uL (ref 0.0–0.5)
Eosinophils Relative: 0 %
HCT: 41 % (ref 39.0–52.0)
Hemoglobin: 13.1 g/dL (ref 13.0–17.0)
Immature Granulocytes: 0 %
Lymphocytes Relative: 91 %
Lymphs Abs: 38.2 10*3/uL — ABNORMAL HIGH (ref 0.7–4.0)
MCH: 31.5 pg (ref 26.0–34.0)
MCHC: 32 g/dL (ref 30.0–36.0)
MCV: 98.6 fL (ref 80.0–100.0)
Monocytes Absolute: 1.1 10*3/uL — ABNORMAL HIGH (ref 0.1–1.0)
Monocytes Relative: 3 %
Neutro Abs: 2.5 10*3/uL (ref 1.7–7.7)
Neutrophils Relative %: 6 %
Platelet Count: 116 10*3/uL — ABNORMAL LOW (ref 150–400)
RBC: 4.16 MIL/uL — ABNORMAL LOW (ref 4.22–5.81)
RDW: 13.4 % (ref 11.5–15.5)
WBC Count: 42 10*3/uL — ABNORMAL HIGH (ref 4.0–10.5)
nRBC: 0 % (ref 0.0–0.2)

## 2020-07-25 LAB — CMP (CANCER CENTER ONLY)
ALT: 34 U/L (ref 0–44)
AST: 34 U/L (ref 15–41)
Albumin: 4.1 g/dL (ref 3.5–5.0)
Alkaline Phosphatase: 86 U/L (ref 38–126)
Anion gap: 7 (ref 5–15)
BUN: 16 mg/dL (ref 8–23)
CO2: 28 mmol/L (ref 22–32)
Calcium: 9.6 mg/dL (ref 8.9–10.3)
Chloride: 106 mmol/L (ref 98–111)
Creatinine: 0.93 mg/dL (ref 0.61–1.24)
GFR, Est AFR Am: >60 mL/min (ref >60)
GFR, Estimated: >60 mL/min (ref >60)
Glucose, Bld: 117 mg/dL — ABNORMAL HIGH (ref 70–99)
Potassium: 4.3 mmol/L (ref 3.5–5.1)
Sodium: 141 mmol/L (ref 135–145)
Total Bilirubin: 1.6 mg/dL — ABNORMAL HIGH (ref 0.3–1.2)
Total Protein: 6.4 g/dL — ABNORMAL LOW (ref 6.5–8.1)

## 2020-07-25 LAB — LACTATE DEHYDROGENASE: LDH: 210 U/L — ABNORMAL HIGH (ref 98–192)

## 2020-07-25 NOTE — Progress Notes (Signed)
[facility] Telephone:(336) [phone]   Fax:(336) [phone]  OFFICE PROGRESS NOTE  [name], [name], MD [place] [address]  DIAGNOSIS: Stage 0 Chronic lymphocytic leukemia   PRIOR THERAPY: None   CURRENT THERAPY: Observation  INTERVAL HISTORY: Jerry Gomez 86 y.o. male returns to the clinic today for 6 months follow-up visit.  The patient is feeling fine today with no concerning complaints.  He denied having any current chest pain, shortness of breath, cough or hemoptysis.  He has no nausea, vomiting, diarrhea or constipation.  He denied having any recent weight loss or night sweats and he actually gained around 7 pounds since his last visit.  The patient has no fever or chills.  He is here today for evaluation and repeat blood work.  MEDICAL HISTORY: Past Medical History:  Diagnosis Date  . Cancer ([place])    CLL  . Cataract   . COLONIC POLYPS 03/02/2009  . EYE SURGERY, HX OF 09/05/2007  . HEMORRHOIDS, HX OF   . HYPERLIPIDEMIA 09/05/2007  . HYPERTENSION 07/25/2007  . OSTEOARTHRITIS 09/05/2007  . SLEEP APNEA, OBSTRUCTIVE 09/05/2007    ALLERGIES:  has No Known Allergies.  MEDICATIONS:  Current Outpatient Medications  Medication Sig Dispense Refill  . Artificial Tear Ointment (ARTIFICIAL TEARS) ointment Place into both eyes nightly.    [name] aspirin 162 MG EC tablet Take 1 tablet (162 mg total) by mouth daily. (Patient taking differently: Take 81 mg by mouth daily. Taking 1/2 of 81 mg) 100 tablet 3  . atorvastatin (LIPITOR) 20 MG tablet Take 1 tablet (20 mg total) by mouth daily. 90 tablet 1  . Cholecalciferol (VITAMIN D) 2000 units tablet Take 2,000 Units by mouth daily.    . furosemide (LASIX) 20 MG tablet Take 1 tablet (20 mg total) by mouth daily. 90 tablet 1  . metoprolol succinate (TOPROL-XL) 50 MG 24 hr tablet TAKE 1 TABLET BY MOUTH DAILY. TAKE WITH OR IMMEDIATELY FOLLOWING A MEAL. 90 tablet 1  . Multiple Vitamins-Minerals (CENTRUM  SILVER ULTRA MENS PO) Take 1 tablet by mouth.     No current facility-administered medications for this visit.    SURGICAL HISTORY:  Past Surgical History:  Procedure Laterality Date  . EYE SURGERY  10/22/13   left eye, cataract    REVIEW OF SYSTEMS:  A comprehensive review of systems was negative.   PHYSICAL EXAMINATION: General appearance: alert, cooperative and no distress Head: Normocephalic, without obvious abnormality, atraumatic Neck: no adenopathy Lymph nodes: Cervical, supraclavicular, and axillary nodes normal. Resp: clear to auscultation bilaterally Back: symmetric, no curvature. ROM normal. No CVA tenderness. Cardio: regular rate and rhythm, S1, S2 normal, no murmur, click, rub or gallop GI: soft, non-tender; bowel sounds normal; no masses,  no organomegaly Extremities: extremities normal, atraumatic, no cyanosis or edema  ECOG PERFORMANCE STATUS: 1 - Symptomatic but completely ambulatory  Blood pressure (!) 130/55, pulse 66, temperature 98.1 F (36.7 C), temperature source Axillary, resp. rate 17, height 5\' 8"  (1.727 m), weight 162 lb (73.5 kg), SpO2 100 %.  LABORATORY DATA: Lab Results  Component Value Date   WBC 42.0 (H) 07/25/2020   HGB 13.1 07/25/2020   HCT 41.0 07/25/2020   MCV 98.6 07/25/2020   PLT 116 (L) 07/25/2020      Chemistry      Component Value Date/Time   NA 142 01/26/2020 1013   NA 141 06/17/2017 1259   K 4.3 01/26/2020 1013   K 4.5 06/17/2017 1259  CL 104 01/26/2020 1013   CL 104 12/14/2012 1031   CO2 29 01/26/2020 1013   CO2 30 (H) 06/17/2017 1259   BUN 20 01/26/2020 1013   BUN 16.4 06/17/2017 1259   CREATININE 1.02 01/26/2020 1013   CREATININE 1.1 06/17/2017 1259      Component Value Date/Time   CALCIUM 8.9 01/26/2020 1013   CALCIUM 9.8 06/17/2017 1259   ALKPHOS 83 01/26/2020 1013   ALKPHOS 75 06/17/2017 1259   AST 19 01/26/2020 1013   AST 29 06/17/2017 1259   ALT 17 01/26/2020 1013   ALT 27 06/17/2017 1259   BILITOT 1.4  (H) 01/26/2020 1013   BILITOT 2.19 (H) 06/17/2017 1259       RADIOGRAPHIC STUDIES: No results found.  ASSESSMENT AND PLAN:  This is a very pleasant 86 years old white male with a stage 0 chronic lymphocytic leukemia.  The patient has been on observation for several years with no concerning complaints. Repeat CBC today showed mild further elevation on the total white blood count but consider very stable compared to a year ago. I recommended for the patient to continue on observation with repeat CBC, comprehensive metabolic panel and LDH in 6 months. He was advised to call immediately if he has any concerning symptoms in the interval. All questions were answered. The patient knows to call the clinic with any problems, questions or concerns. We can certainly see the patient much sooner if necessary.  Disclaimer: This note was dictated with voice recognition software. Similar sounding words can inadvertently be transcribed and may not be corrected upon review.

## 2020-07-25 NOTE — Telephone Encounter (Signed)
Scheduled per 8/17 los. Printed avs and calendar for pt. 

## 2020-07-28 ENCOUNTER — Other Ambulatory Visit (INDEPENDENT_AMBULATORY_CARE_PROVIDER_SITE_OTHER): Payer: Medicare Other

## 2020-07-28 ENCOUNTER — Telehealth: Payer: Self-pay

## 2020-07-28 ENCOUNTER — Other Ambulatory Visit: Payer: Self-pay

## 2020-07-28 DIAGNOSIS — Z23 Encounter for immunization: Secondary | ICD-10-CM | POA: Diagnosis not present

## 2020-07-28 DIAGNOSIS — C9110 Chronic lymphocytic leukemia of B-cell type not having achieved remission: Secondary | ICD-10-CM | POA: Diagnosis not present

## 2020-07-28 DIAGNOSIS — R972 Elevated prostate specific antigen [PSA]: Secondary | ICD-10-CM

## 2020-07-28 DIAGNOSIS — E785 Hyperlipidemia, unspecified: Secondary | ICD-10-CM

## 2020-07-28 DIAGNOSIS — I10 Essential (primary) hypertension: Secondary | ICD-10-CM

## 2020-07-28 LAB — LIPID PANEL
Cholesterol: 131 mg/dL (ref 0–200)
HDL: 61.8 mg/dL (ref >39.00)
LDL Cholesterol: 58 mg/dL (ref 0–99)
NonHDL: 69.43
Total CHOL/HDL Ratio: 2
Triglycerides: 57 mg/dL (ref 0.0–149.0)
VLDL: 11.4 mg/dL (ref 0.0–40.0)

## 2020-07-28 LAB — URINALYSIS
Bilirubin Urine: NEGATIVE
Hgb urine dipstick: NEGATIVE
Ketones, ur: NEGATIVE
Leukocytes,Ua: NEGATIVE
Nitrite: NEGATIVE
Specific Gravity, Urine: 1.015 (ref 1.000–1.030)
Total Protein, Urine: NEGATIVE
Urine Glucose: NEGATIVE
Urobilinogen, UA: 0.2 (ref 0.0–1.0)
pH: 7 (ref 5.0–8.0)

## 2020-07-28 LAB — CBC WITH DIFFERENTIAL/PLATELET
Basophils Absolute: 0.1 10*3/uL (ref 0.0–0.1)
Basophils Relative: 0.3 % (ref 0.0–3.0)
Eosinophils Absolute: 0.1 10*3/uL (ref 0.0–0.7)
Eosinophils Relative: 0.2 % (ref 0.0–5.0)
HCT: 41.1 % (ref 39.0–52.0)
Hemoglobin: 13.6 g/dL (ref 13.0–17.0)
Lymphocytes Relative: 90.7 % — ABNORMAL HIGH (ref 12.0–46.0)
Lymphs Abs: 37.5 10*3/uL — ABNORMAL HIGH (ref 0.7–4.0)
MCHC: 33 g/dL (ref 30.0–36.0)
MCV: 98.1 fl (ref 78.0–100.0)
Monocytes Absolute: 0.6 10*3/uL (ref 0.1–1.0)
Monocytes Relative: 1.4 % — ABNORMAL LOW (ref 3.0–12.0)
Neutro Abs: 3 10*3/uL (ref 1.4–7.7)
Neutrophils Relative %: 7.4 % — ABNORMAL LOW (ref 43.0–77.0)
Platelets: 125 10*3/uL — ABNORMAL LOW (ref 150.0–400.0)
RBC: 4.2 Mil/uL — ABNORMAL LOW (ref 4.22–5.81)
RDW: 14.3 % (ref 11.5–15.5)
WBC: 41.2 10*3/uL (ref 4.0–10.5)

## 2020-07-28 LAB — BASIC METABOLIC PANEL
BUN: 14 mg/dL (ref 6–23)
CO2: 32 mEq/L (ref 19–32)
Calcium: 9.2 mg/dL (ref 8.4–10.5)
Chloride: 105 mEq/L (ref 96–112)
Creatinine, Ser: 0.91 mg/dL (ref 0.40–1.50)
GFR: 78.8 mL/min (ref >60.00)
Glucose, Bld: 105 mg/dL — ABNORMAL HIGH (ref 70–99)
Potassium: 4.3 mEq/L (ref 3.5–5.1)
Sodium: 142 mEq/L (ref 135–145)

## 2020-07-28 LAB — HEPATIC FUNCTION PANEL
ALT: 26 U/L (ref 0–53)
AST: 25 U/L (ref 0–37)
Albumin: 4.3 g/dL (ref 3.5–5.2)
Alkaline Phosphatase: 82 U/L (ref 39–117)
Bilirubin, Direct: 0.3 mg/dL (ref 0.0–0.3)
Total Bilirubin: 1.7 mg/dL — ABNORMAL HIGH (ref 0.2–1.2)
Total Protein: 6.1 g/dL (ref 6.0–8.3)

## 2020-07-28 LAB — TSH: TSH: 1.87 u[IU]/mL (ref 0.35–4.50)

## 2020-07-28 LAB — PSA: PSA: 7.54 ng/mL — ABNORMAL HIGH (ref 0.10–4.00)

## 2020-07-28 NOTE — Telephone Encounter (Signed)
CRITICAL VALUE STICKER  CRITICAL VALUE: WBC 41.2  RECEIVER (on-site recipient of call): Villa Herb RN  DATE & TIME NOTIFIED: 07/28/20 1649  MESSENGER (representative from lab): Esperanza Richters  MD NOTIFIED: Dr Quay Burow  TIME OF NOTIFICATION: 0228  RESPONSE: No new orders.

## 2020-08-01 ENCOUNTER — Ambulatory Visit (INDEPENDENT_AMBULATORY_CARE_PROVIDER_SITE_OTHER): Payer: Medicare Other | Admitting: Internal Medicine

## 2020-08-01 ENCOUNTER — Other Ambulatory Visit: Payer: Self-pay

## 2020-08-01 ENCOUNTER — Encounter: Payer: Self-pay | Admitting: Internal Medicine

## 2020-08-01 DIAGNOSIS — I10 Essential (primary) hypertension: Secondary | ICD-10-CM | POA: Diagnosis not present

## 2020-08-01 DIAGNOSIS — E785 Hyperlipidemia, unspecified: Secondary | ICD-10-CM | POA: Diagnosis not present

## 2020-08-01 DIAGNOSIS — R972 Elevated prostate specific antigen [PSA]: Secondary | ICD-10-CM

## 2020-08-01 DIAGNOSIS — C9110 Chronic lymphocytic leukemia of B-cell type not having achieved remission: Secondary | ICD-10-CM

## 2020-08-01 NOTE — Progress Notes (Signed)
Subjective:  Patient ID: Jerry Gomez, male    DOB: 1931-09-19  Age: 86 y.o. MRN: [id number]  CC: Follow-up (6 month follow-up)   HPI Jerry Gomez presents for CLL, HTN, dyslipidemia f/u  Outpatient Medications Prior to Visit  Medication Sig Dispense Refill  . Artificial Tear Ointment (ARTIFICIAL TEARS) ointment Place into both eyes nightly.    [name] aspirin 81 MG EC tablet Take 81 mg by mouth daily. Swallow whole.    [name] atorvastatin (LIPITOR) 20 MG tablet Take 1 tablet (20 mg total) by mouth daily. 90 tablet 1  . Cholecalciferol (VITAMIN D) 2000 units tablet Take 2,000 Units by mouth daily.    . furosemide (LASIX) 20 MG tablet Take 1 tablet (20 mg total) by mouth daily. 90 tablet 1  . metoprolol succinate (TOPROL-XL) 50 MG 24 hr tablet TAKE 1 TABLET BY MOUTH DAILY. TAKE WITH OR IMMEDIATELY FOLLOWING A MEAL. 90 tablet 1  . Multiple Vitamins-Minerals (CENTRUM SILVER ULTRA MENS PO) Take 1 tablet by mouth.    [name] aspirin 162 MG EC tablet Take 1 tablet (162 mg total) by mouth daily. (Patient not taking: Reported on 08/01/2020) 100 tablet 3   No facility-administered medications prior to visit.    ROS: Review of Systems  Constitutional: Negative for appetite change, fatigue and unexpected weight change.  HENT: Negative for congestion, nosebleeds, sneezing, sore throat and trouble swallowing.   Eyes: Negative for itching and visual disturbance.  Respiratory: Negative for cough.   Cardiovascular: Negative for chest pain, palpitations and leg swelling.  Gastrointestinal: Negative for abdominal distention, blood in stool, diarrhea and nausea.  Genitourinary: Negative for frequency and hematuria.  Musculoskeletal: Negative for back pain, gait problem, joint swelling and neck pain.  Skin: Negative for rash.  Neurological: Negative for dizziness, tremors, speech difficulty and weakness.  Hematological: Bruises/bleeds easily.  Psychiatric/Behavioral: Negative for agitation, dysphoric mood and sleep  disturbance. The patient is not nervous/anxious.     Objective:  BP 120/68 (BP Location: Left Arm)   Pulse 68   Temp 98.3 F (36.8 C) (Oral)   Wt 161 lb 3.2 oz (73.1 kg)   SpO2 98%   BMI 24.51 kg/m   BP Readings from Last 3 Encounters:  08/01/20 120/68  07/25/20 (!) 130/55  01/31/20 128/62    Wt Readings from Last 3 Encounters:  08/01/20 161 lb 3.2 oz (73.1 kg)  07/25/20 162 lb (73.5 kg)  01/31/20 155 lb (70.3 kg)    Physical Exam Constitutional:      General: He is not in acute distress.    Appearance: He is well-developed.     Comments: NAD  Eyes:     Conjunctiva/sclera: Conjunctivae normal.     Pupils: Pupils are equal, round, and reactive to light.  Neck:     Thyroid: No thyromegaly.     Vascular: No JVD.  Cardiovascular:     Rate and Rhythm: Normal rate and regular rhythm.     Heart sounds: Normal heart sounds. No murmur heard.  No friction rub. No gallop.   Pulmonary:     Effort: Pulmonary effort is normal. No respiratory distress.     Breath sounds: Normal breath sounds. No wheezing or rales.  Chest:     Chest wall: No tenderness.  Abdominal:     General: Bowel sounds are normal. There is no distension.     Palpations: Abdomen is soft. There is no mass.     Tenderness: There is no abdominal tenderness. There is no  guarding or rebound.  Musculoskeletal:        General: No swelling or tenderness. Normal range of motion.     Cervical back: Normal range of motion.  Lymphadenopathy:     Cervical: No cervical adenopathy.  Skin:    General: Skin is warm and dry.     Findings: Bruising present. No rash.  Neurological:     Mental Status: He is alert and oriented to person, place, and time.     Cranial Nerves: No cranial nerve deficit.     Motor: No abnormal muscle tone.     Coordination: Coordination normal.     Gait: Gait normal.     Deep Tendon Reflexes: Reflexes are normal and symmetric.  Psychiatric:        Behavior: Behavior normal.        Thought  Content: Thought content normal.        Judgment: Judgment normal.   forearms w/bruises  Lab Results  Component Value Date   WBC 41.2 Repeated and verified X2. (HH) 07/28/2020   HGB 13.6 07/28/2020   HCT 41.1 07/28/2020   PLT 125.0 (L) 07/28/2020   GLUCOSE 105 (H) 07/28/2020   CHOL 131 07/28/2020   TRIG 57.0 07/28/2020   HDL 61.80 07/28/2020   LDLCALC 58 07/28/2020   ALT 26 07/28/2020   AST 25 07/28/2020   NA 142 07/28/2020   K 4.3 07/28/2020   CL 105 07/28/2020   CREATININE 0.91 07/28/2020   BUN 14 07/28/2020   CO2 32 07/28/2020   TSH 1.87 07/28/2020   PSA 7.54 (H) 07/28/2020    No results found.  Assessment & Plan:   There are no diagnoses linked to this encounter.   No orders of the defined types were placed in this encounter.    Follow-up: No follow-ups on file.  Walker Kehr, MD

## 2020-08-01 NOTE — Assessment & Plan Note (Addendum)
Followed every 6 months by Dr. Julien Nordmann at [facility]

## 2020-08-01 NOTE — Assessment & Plan Note (Signed)
Metoprolol, furosemide

## 2020-08-01 NOTE — Assessment & Plan Note (Signed)
Atorvastatin

## 2020-08-01 NOTE — Assessment & Plan Note (Signed)
Stable - PSA in 6 mo

## 2020-08-15 DIAGNOSIS — H35373 Puckering of macula, bilateral: Secondary | ICD-10-CM | POA: Diagnosis not present

## 2020-08-15 DIAGNOSIS — Z961 Presence of intraocular lens: Secondary | ICD-10-CM | POA: Diagnosis not present

## 2020-08-15 DIAGNOSIS — H18421 Band keratopathy, right eye: Secondary | ICD-10-CM | POA: Diagnosis not present

## 2020-09-12 ENCOUNTER — Other Ambulatory Visit: Payer: Self-pay

## 2020-09-12 ENCOUNTER — Ambulatory Visit (INDEPENDENT_AMBULATORY_CARE_PROVIDER_SITE_OTHER): Payer: Medicare Other

## 2020-09-12 DIAGNOSIS — Z23 Encounter for immunization: Secondary | ICD-10-CM | POA: Diagnosis not present

## 2020-09-13 ENCOUNTER — Ambulatory Visit: Payer: Medicare Other

## 2020-10-09 ENCOUNTER — Telehealth: Payer: Self-pay | Admitting: Internal Medicine

## 2020-10-09 NOTE — Telephone Encounter (Signed)
Patient wondering if he needs a shingles vaccine Patient # [phone]

## 2020-10-10 NOTE — Telephone Encounter (Signed)
Notified pt w/MD response.../lmb 

## 2020-10-10 NOTE — Telephone Encounter (Signed)
No.  He had in 2012.  Thanks

## 2020-11-07 DIAGNOSIS — L853 Xerosis cutis: Secondary | ICD-10-CM | POA: Diagnosis not present

## 2020-11-07 DIAGNOSIS — D1801 Hemangioma of skin and subcutaneous tissue: Secondary | ICD-10-CM | POA: Diagnosis not present

## 2020-11-07 DIAGNOSIS — L821 Other seborrheic keratosis: Secondary | ICD-10-CM | POA: Diagnosis not present

## 2020-11-07 DIAGNOSIS — Z85828 Personal history of other malignant neoplasm of skin: Secondary | ICD-10-CM | POA: Diagnosis not present

## 2020-11-07 DIAGNOSIS — L814 Other melanin hyperpigmentation: Secondary | ICD-10-CM | POA: Diagnosis not present

## 2020-11-07 DIAGNOSIS — L720 Epidermal cyst: Secondary | ICD-10-CM | POA: Diagnosis not present

## 2020-11-07 DIAGNOSIS — D692 Other nonthrombocytopenic purpura: Secondary | ICD-10-CM | POA: Diagnosis not present

## 2020-11-07 DIAGNOSIS — L570 Actinic keratosis: Secondary | ICD-10-CM | POA: Diagnosis not present

## 2021-01-25 ENCOUNTER — Inpatient Hospital Stay: Payer: Medicare Other

## 2021-01-25 ENCOUNTER — Other Ambulatory Visit: Payer: Self-pay

## 2021-01-25 ENCOUNTER — Inpatient Hospital Stay: Payer: Medicare Other | Attending: Internal Medicine | Admitting: Internal Medicine

## 2021-01-25 VITALS — BP 129/60 | HR 60 | Temp 97.7°F | Resp 13 | Ht 68.0 in | Wt 157.6 lb

## 2021-01-25 DIAGNOSIS — E785 Hyperlipidemia, unspecified: Secondary | ICD-10-CM | POA: Diagnosis not present

## 2021-01-25 DIAGNOSIS — I10 Essential (primary) hypertension: Secondary | ICD-10-CM | POA: Insufficient documentation

## 2021-01-25 DIAGNOSIS — Z79899 Other long term (current) drug therapy: Secondary | ICD-10-CM | POA: Insufficient documentation

## 2021-01-25 DIAGNOSIS — C9110 Chronic lymphocytic leukemia of B-cell type not having achieved remission: Secondary | ICD-10-CM

## 2021-01-25 DIAGNOSIS — G4733 Obstructive sleep apnea (adult) (pediatric): Secondary | ICD-10-CM | POA: Insufficient documentation

## 2021-01-25 DIAGNOSIS — Z7982 Long term (current) use of aspirin: Secondary | ICD-10-CM | POA: Diagnosis not present

## 2021-01-25 LAB — CMP (CANCER CENTER ONLY)
ALT: 17 U/L (ref 0–44)
AST: 20 U/L (ref 15–41)
Albumin: 4.1 g/dL (ref 3.5–5.0)
Alkaline Phosphatase: 86 U/L (ref 38–126)
Anion gap: 6 (ref 5–15)
BUN: 16 mg/dL (ref 8–23)
CO2: 30 mmol/L (ref 22–32)
Calcium: 9 mg/dL (ref 8.9–10.3)
Chloride: 103 mmol/L (ref 98–111)
Creatinine: 0.93 mg/dL (ref 0.61–1.24)
GFR, Estimated: >60 mL/min (ref >60)
Glucose, Bld: 95 mg/dL (ref 70–99)
Potassium: 4.4 mmol/L (ref 3.5–5.1)
Sodium: 139 mmol/L (ref 135–145)
Total Bilirubin: 1.6 mg/dL — ABNORMAL HIGH (ref 0.3–1.2)
Total Protein: 6.3 g/dL — ABNORMAL LOW (ref 6.5–8.1)

## 2021-01-25 LAB — CBC WITH DIFFERENTIAL (CANCER CENTER ONLY)
Abs Immature Granulocytes: 0.04 10*3/uL (ref 0.00–0.07)
Basophils Absolute: 0.1 10*3/uL (ref 0.0–0.1)
Basophils Relative: 0 %
Eosinophils Absolute: 0.1 10*3/uL (ref 0.0–0.5)
Eosinophils Relative: 0 %
HCT: 40.2 % (ref 39.0–52.0)
Hemoglobin: 13.2 g/dL (ref 13.0–17.0)
Immature Granulocytes: 0 %
Lymphocytes Relative: 91 %
Lymphs Abs: 37.1 10*3/uL — ABNORMAL HIGH (ref 0.7–4.0)
MCH: 31.7 pg (ref 26.0–34.0)
MCHC: 32.8 g/dL (ref 30.0–36.0)
MCV: 96.4 fL (ref 80.0–100.0)
Monocytes Absolute: 1.3 10*3/uL — ABNORMAL HIGH (ref 0.1–1.0)
Monocytes Relative: 3 %
Neutro Abs: 2.5 10*3/uL (ref 1.7–7.7)
Neutrophils Relative %: 6 %
Platelet Count: 137 10*3/uL — ABNORMAL LOW (ref 150–400)
RBC: 4.17 MIL/uL — ABNORMAL LOW (ref 4.22–5.81)
RDW: 12.7 % (ref 11.5–15.5)
WBC Count: 41.2 10*3/uL — ABNORMAL HIGH (ref 4.0–10.5)
nRBC: 0 % (ref 0.0–0.2)

## 2021-01-25 LAB — LACTATE DEHYDROGENASE: LDH: 169 U/L (ref 98–192)

## 2021-01-25 NOTE — Progress Notes (Signed)
[facility] [phone]   [phone]  OFFICE PROGRESS NOTE  Jerry Gomez, Jerry Lacks, MD [place] [address]  DIAGNOSIS: Stage 0 Chronic lymphocytic leukemia   PRIOR THERAPY: None   CURRENT THERAPY: Observation  INTERVAL HISTORY: Jerry Gomez 87 y.o. male returns to the clinic today for 6 months follow-up visit.  The patient is feeling fine today with no concerning complaints.  He denied having any current chest pain, shortness of breath, cough or hemoptysis.  He denied having any fever or chills.  He has no nausea, vomiting, diarrhea or constipation.  He denied having any bleeding, bruises or ecchymosis.  He has no palpable lymphadenopathy.  The patient is here today for evaluation and repeat blood work.   MEDICAL HISTORY: Past Medical History:  Diagnosis Date  . Cancer ([facility])    CLL  . Cataract   . COLONIC POLYPS 03/02/2009  . EYE SURGERY, HX OF 09/05/2007  . HEMORRHOIDS, HX OF   . HYPERLIPIDEMIA 09/05/2007  . HYPERTENSION 07/25/2007  . OSTEOARTHRITIS 09/05/2007  . SLEEP APNEA, OBSTRUCTIVE 09/05/2007    ALLERGIES:  has No Known Allergies.  MEDICATIONS:  Current Outpatient Medications  Medication Sig Dispense Refill  . Artificial Tear Ointment (ARTIFICIAL TEARS) ointment Place into both eyes nightly.    [name] aspirin 81 MG EC tablet Take 81 mg by mouth daily. Swallow whole.    [name] atorvastatin (LIPITOR) 20 MG tablet Take 1 tablet (20 mg total) by mouth daily. 90 tablet 1  . Cholecalciferol (VITAMIN D) 2000 units tablet Take 2,000 Units by mouth daily.    . furosemide (LASIX) 20 MG tablet Take 1 tablet (20 mg total) by mouth daily. 90 tablet 1  . metoprolol succinate (TOPROL-XL) 50 MG 24 hr tablet TAKE 1 TABLET BY MOUTH DAILY. TAKE WITH OR IMMEDIATELY FOLLOWING A MEAL. 90 tablet 1  . Multiple Vitamins-Minerals (CENTRUM SILVER ULTRA MENS PO) Take 1 tablet by mouth.     No current facility-administered medications for this  visit.    SURGICAL HISTORY:  Past Surgical History:  Procedure Laterality Date  . EYE SURGERY  10/22/13   left eye, cataract    REVIEW OF SYSTEMS:  A comprehensive review of systems was negative.   PHYSICAL EXAMINATION: General appearance: alert, cooperative and no distress Head: Normocephalic, without obvious abnormality, atraumatic Neck: no adenopathy Lymph nodes: Cervical, supraclavicular, and axillary nodes normal. Resp: clear to auscultation bilaterally Back: symmetric, no curvature. ROM normal. No CVA tenderness. Cardio: regular rate and rhythm, S1, S2 normal, no murmur, click, rub or gallop GI: soft, non-tender; bowel sounds normal; no masses,  no organomegaly Extremities: extremities normal, atraumatic, no cyanosis or edema  ECOG PERFORMANCE STATUS: 1 - Symptomatic but completely ambulatory  Blood pressure 129/60, pulse 60, temperature 97.7 F (36.5 C), temperature source Tympanic, resp. rate 13, height 5\' 8"  (1.727 m), weight 157 lb 9.6 oz (71.5 kg), SpO2 100 %.  LABORATORY DATA: Lab Results  Component Value Date   WBC 41.2 (H) 01/25/2021   HGB 13.2 01/25/2021   HCT 40.2 01/25/2021   MCV 96.4 01/25/2021   PLT 137 (L) 01/25/2021      Chemistry      Component Value Date/Time   NA 139 01/25/2021 1053   NA 141 06/17/2017 1259   K 4.4 01/25/2021 1053   K 4.5 06/17/2017 1259   CL 103 01/25/2021 1053   CL 104 12/14/2012 1031   CO2 30 01/25/2021 1053  CO2 30 (H) 06/17/2017 1259   BUN 16 01/25/2021 1053   BUN 16.4 06/17/2017 1259   CREATININE 0.93 01/25/2021 1053   CREATININE 1.1 06/17/2017 1259      Component Value Date/Time   CALCIUM 9.0 01/25/2021 1053   CALCIUM 9.8 06/17/2017 1259   ALKPHOS 86 01/25/2021 1053   ALKPHOS 75 06/17/2017 1259   AST 20 01/25/2021 1053   AST 29 06/17/2017 1259   ALT 17 01/25/2021 1053   ALT 27 06/17/2017 1259   BILITOT 1.6 (H) 01/25/2021 1053   BILITOT 2.19 (H) 06/17/2017 1259       RADIOGRAPHIC STUDIES: No results  found.  ASSESSMENT AND PLAN:  This is a very pleasant 87 years old white male with a stage 0 chronic lymphocytic leukemia.  The patient has been on observation for several years with no concerning complaints. The patient is feeling well today. Repeat CBC today showed a stable total white blood count of 41.2 with improving platelets count. I recommended for the patient to continue on observation with repeat CBC, comprehensive metabolic panel and LDH in 6 months. The patient was advised to call immediately if he has any other concerning symptoms in the interval. All questions were answered. The patient knows to call the clinic with any problems, questions or concerns. We can certainly see the patient much sooner if necessary.  Disclaimer: This note was dictated with voice recognition software. Similar sounding words can inadvertently be transcribed and may not be corrected upon review.

## 2021-01-29 ENCOUNTER — Telehealth: Payer: Self-pay

## 2021-01-29 ENCOUNTER — Telehealth: Payer: Self-pay | Admitting: Internal Medicine

## 2021-01-29 ENCOUNTER — Other Ambulatory Visit: Payer: Self-pay

## 2021-01-29 ENCOUNTER — Other Ambulatory Visit (INDEPENDENT_AMBULATORY_CARE_PROVIDER_SITE_OTHER): Payer: Medicare Other

## 2021-01-29 DIAGNOSIS — R972 Elevated prostate specific antigen [PSA]: Secondary | ICD-10-CM | POA: Diagnosis not present

## 2021-01-29 DIAGNOSIS — C9110 Chronic lymphocytic leukemia of B-cell type not having achieved remission: Secondary | ICD-10-CM

## 2021-01-29 LAB — COMPREHENSIVE METABOLIC PANEL
ALT: 14 U/L (ref 0–53)
AST: 20 U/L (ref 0–37)
Albumin: 4.2 g/dL (ref 3.5–5.2)
Alkaline Phosphatase: 82 U/L (ref 39–117)
BUN: 17 mg/dL (ref 6–23)
CO2: 32 mEq/L (ref 19–32)
Calcium: 9.1 mg/dL (ref 8.4–10.5)
Chloride: 104 mEq/L (ref 96–112)
Creatinine, Ser: 0.88 mg/dL (ref 0.40–1.50)
GFR: 77.29 mL/min (ref >60.00)
Glucose, Bld: 102 mg/dL — ABNORMAL HIGH (ref 70–99)
Potassium: 4.2 mEq/L (ref 3.5–5.1)
Sodium: 143 mEq/L (ref 135–145)
Total Bilirubin: 1.5 mg/dL — ABNORMAL HIGH (ref 0.2–1.2)
Total Protein: 6 g/dL (ref 6.0–8.3)

## 2021-01-29 LAB — CBC WITH DIFFERENTIAL/PLATELET
Basophils Absolute: 0.1 10*3/uL (ref 0.0–0.1)
Basophils Relative: 0.2 % (ref 0.0–3.0)
Eosinophils Absolute: 0.1 10*3/uL (ref 0.0–0.7)
Eosinophils Relative: 0.3 % (ref 0.0–5.0)
HCT: 41.2 % (ref 39.0–52.0)
Hemoglobin: 13.5 g/dL (ref 13.0–17.0)
Lymphocytes Relative: 91.4 % — ABNORMAL HIGH (ref 12.0–46.0)
Lymphs Abs: 38.6 10*3/uL — ABNORMAL HIGH (ref 0.7–4.0)
MCHC: 32.8 g/dL (ref 30.0–36.0)
MCV: 95.9 fl (ref 78.0–100.0)
Monocytes Absolute: 0.6 10*3/uL (ref 0.1–1.0)
Monocytes Relative: 1.5 % — ABNORMAL LOW (ref 3.0–12.0)
Neutro Abs: 2.8 10*3/uL (ref 1.4–7.7)
Neutrophils Relative %: 6.6 % — ABNORMAL LOW (ref 43.0–77.0)
Platelets: 138 10*3/uL — ABNORMAL LOW (ref 150.0–400.0)
RBC: 4.3 Mil/uL (ref 4.22–5.81)
RDW: 13.1 % (ref 11.5–15.5)
WBC: 42.3 10*3/uL (ref 4.0–10.5)

## 2021-01-29 LAB — PSA: PSA: 8.87 ng/mL — ABNORMAL HIGH (ref 0.10–4.00)

## 2021-01-29 NOTE — Telephone Encounter (Signed)
CRITICAL VALUE STICKER  CRITICAL VALUE: WBC 42.3  RECEIVER (on-site recipient of call): Elza Rafter [facility]  [place] NOTIFIED: 01/29/21 at 1200  MESSENGER (representative from lab): Santiago Glad  MD NOTIFIED: Dr Alain Marion  TIME OF NOTIFICATION: 1201  RESPONSE: Awaiting Response

## 2021-01-29 NOTE — Addendum Note (Signed)
Addended by: Boris Lown B on: 01/29/2021 09:57 AM   Modules accepted: Orders

## 2021-01-29 NOTE — Telephone Encounter (Signed)
Scheduled appts per 2/17 los. Pt confirmed appt date/time.

## 2021-02-01 ENCOUNTER — Other Ambulatory Visit: Payer: Self-pay

## 2021-02-01 ENCOUNTER — Ambulatory Visit (INDEPENDENT_AMBULATORY_CARE_PROVIDER_SITE_OTHER): Payer: Medicare Other | Admitting: Internal Medicine

## 2021-02-01 ENCOUNTER — Encounter: Payer: Self-pay | Admitting: Internal Medicine

## 2021-02-01 VITALS — BP 138/62 | HR 54 | Temp 98.5°F | Ht 68.0 in | Wt 156.4 lb

## 2021-02-01 DIAGNOSIS — I10 Essential (primary) hypertension: Secondary | ICD-10-CM

## 2021-02-01 DIAGNOSIS — R972 Elevated prostate specific antigen [PSA]: Secondary | ICD-10-CM

## 2021-02-01 DIAGNOSIS — E785 Hyperlipidemia, unspecified: Secondary | ICD-10-CM | POA: Diagnosis not present

## 2021-02-01 DIAGNOSIS — C9110 Chronic lymphocytic leukemia of B-cell type not having achieved remission: Secondary | ICD-10-CM | POA: Diagnosis not present

## 2021-02-01 NOTE — Progress Notes (Signed)
Subjective:  Patient ID: Jerry Gomez, male    DOB: 20-Dec-1930  Age: 87 y.o. MRN: [id number]  CC: Follow-up (6 month f/u)   HPI Jerry Gomez presents for dyslipidemia, CLL, HTN f/u Evusheld, 2nd booster discussed  Outpatient Medications Prior to Visit  Medication Sig Dispense Refill  . Artificial Tear Ointment (ARTIFICIAL TEARS) ointment Place into both eyes nightly.    [name] aspirin 81 MG EC tablet Take 81 mg by mouth daily. Swallow whole.    [name] atorvastatin (LIPITOR) 20 MG tablet Take 1 tablet (20 mg total) by mouth daily. 90 tablet 1  . Cholecalciferol (VITAMIN D) 2000 units tablet Take 2,000 Units by mouth daily.    . furosemide (LASIX) 20 MG tablet Take 1 tablet (20 mg total) by mouth daily. 90 tablet 1  . metoprolol succinate (TOPROL-XL) 50 MG 24 hr tablet TAKE 1 TABLET BY MOUTH DAILY. TAKE WITH OR IMMEDIATELY FOLLOWING A MEAL. 90 tablet 1  . Multiple Vitamins-Minerals (CENTRUM SILVER ULTRA MENS PO) Take 1 tablet by mouth.     No facility-administered medications prior to visit.    ROS: Review of Systems  Constitutional: Negative for appetite change, fatigue and unexpected weight change.  HENT: Negative for congestion, nosebleeds, sneezing, sore throat and trouble swallowing.   Eyes: Negative for itching and visual disturbance.  Respiratory: Negative for cough.   Cardiovascular: Negative for chest pain, palpitations and leg swelling.  Gastrointestinal: Negative for abdominal distention, blood in stool, diarrhea and nausea.  Genitourinary: Negative for frequency and hematuria.  Musculoskeletal: Positive for arthralgias. Negative for back pain, gait problem, joint swelling and neck pain.  Skin: Negative for rash.  Neurological: Negative for dizziness, tremors, speech difficulty and weakness.  Psychiatric/Behavioral: Negative for agitation, dysphoric mood and sleep disturbance. The patient is not nervous/anxious.     Objective:  BP 138/62 (BP Location: Left Arm)   Pulse (!)  54   Temp 98.5 F (36.9 C) (Oral)   Ht 5\' 8"  (1.727 m)   Wt 156 lb 6.4 oz (70.9 kg)   SpO2 97%   BMI 23.78 kg/m   BP Readings from Last 3 Encounters:  02/01/21 138/62  01/25/21 129/60  08/01/20 120/68    Wt Readings from Last 3 Encounters:  02/01/21 156 lb 6.4 oz (70.9 kg)  01/25/21 157 lb 9.6 oz (71.5 kg)  08/01/20 161 lb 3.2 oz (73.1 kg)    Physical Exam Constitutional:      General: He is not in acute distress.    Appearance: He is well-developed.     Comments: NAD  HENT:     Mouth/Throat:     Mouth: Oropharynx is clear and moist.  Eyes:     Conjunctiva/sclera: Conjunctivae normal.     Pupils: Pupils are equal, round, and reactive to light.  Neck:     Thyroid: No thyromegaly.     Vascular: No JVD.  Cardiovascular:     Rate and Rhythm: Normal rate and regular rhythm.     Pulses: Intact distal pulses.     Heart sounds: Normal heart sounds. No murmur heard. No friction rub. No gallop.   Pulmonary:     Effort: Pulmonary effort is normal. No respiratory distress.     Breath sounds: Normal breath sounds. No wheezing or rales.  Chest:     Chest wall: No tenderness.  Abdominal:     General: Bowel sounds are normal. There is no distension.     Palpations: Abdomen is soft. There is no mass.  Tenderness: There is no abdominal tenderness. There is no guarding or rebound.  Musculoskeletal:        General: No tenderness or edema. Normal range of motion.     Cervical back: Normal range of motion.  Lymphadenopathy:     Cervical: No cervical adenopathy.  Skin:    General: Skin is warm and dry.     Findings: No rash.  Neurological:     Mental Status: He is alert and oriented to person, place, and time.     Cranial Nerves: No cranial nerve deficit.     Motor: No abnormal muscle tone.     Coordination: He displays a negative Romberg sign. Coordination normal.     Gait: Gait normal.     Deep Tendon Reflexes: Reflexes are normal and symmetric.  Psychiatric:         Mood and Affect: Mood and affect normal.        Behavior: Behavior normal.        Thought Content: Thought content normal.        Judgment: Judgment normal.     Lab Results  Component Value Date   WBC 42.3 Repeated and verified X2. (HH) 01/29/2021   HGB 13.5 01/29/2021   HCT 41.2 01/29/2021   PLT 138.0 (L) 01/29/2021   GLUCOSE 102 (H) 01/29/2021   CHOL 131 07/28/2020   TRIG 57.0 07/28/2020   HDL 61.80 07/28/2020   LDLCALC 58 07/28/2020   ALT 14 01/29/2021   AST 20 01/29/2021   NA 143 01/29/2021   K 4.2 01/29/2021   CL 104 01/29/2021   CREATININE 0.88 01/29/2021   BUN 17 01/29/2021   CO2 32 01/29/2021   TSH 1.87 07/28/2020   PSA 8.87 (H) 01/29/2021    No results found.  Assessment & Plan:    Jerry Kehr, MD

## 2021-02-01 NOTE — Patient Instructions (Signed)
Evusheld (formerly [id number]) long-acting antibody combination authorised for emergency use in the [place] for pre-exposure prophylaxis (prevention) of COVID-19 

## 2021-02-01 NOTE — Assessment & Plan Note (Signed)
Evusheld, 2nd booster discussed

## 2021-02-01 NOTE — Assessment & Plan Note (Addendum)
Re-check PSA in 6 mo

## 2021-02-14 DIAGNOSIS — H35373 Puckering of macula, bilateral: Secondary | ICD-10-CM | POA: Diagnosis not present

## 2021-02-14 DIAGNOSIS — H18421 Band keratopathy, right eye: Secondary | ICD-10-CM | POA: Diagnosis not present

## 2021-02-14 DIAGNOSIS — Z961 Presence of intraocular lens: Secondary | ICD-10-CM | POA: Diagnosis not present

## 2021-02-21 ENCOUNTER — Encounter: Payer: Self-pay | Admitting: Internal Medicine

## 2021-02-21 ENCOUNTER — Ambulatory Visit (INDEPENDENT_AMBULATORY_CARE_PROVIDER_SITE_OTHER): Payer: Medicare Other

## 2021-02-21 ENCOUNTER — Ambulatory Visit (INDEPENDENT_AMBULATORY_CARE_PROVIDER_SITE_OTHER): Payer: Medicare Other | Admitting: Internal Medicine

## 2021-02-21 ENCOUNTER — Other Ambulatory Visit: Payer: Self-pay

## 2021-02-21 VITALS — BP 124/72 | HR 63 | Temp 98.4°F | Resp 18 | Ht 68.0 in | Wt 151.4 lb

## 2021-02-21 DIAGNOSIS — M5442 Lumbago with sciatica, left side: Secondary | ICD-10-CM

## 2021-02-21 DIAGNOSIS — M545 Low back pain, unspecified: Secondary | ICD-10-CM | POA: Diagnosis not present

## 2021-02-21 MED ORDER — BACLOFEN 10 MG PO TABS: 10.0000 mg | ORAL_TABLET | Freq: Three times a day (TID) | ORAL | 0 refills | Status: DC

## 2021-02-21 MED ORDER — METHYLPREDNISOLONE ACETATE 40 MG/ML IJ SUSP
40.0000 mg | Freq: Once | INTRAMUSCULAR | Status: AC
  Administered 2021-02-21: 40 mg via INTRAMUSCULAR

## 2021-02-21 MED ORDER — PREDNISONE 20 MG PO TABS: 40.0000 mg | ORAL_TABLET | Freq: Every day | ORAL | 0 refills | Status: DC

## 2021-02-21 MED ORDER — KETOROLAC TROMETHAMINE 30 MG/ML IJ SOLN
30.0000 mg | Freq: Once | INTRAMUSCULAR | Status: AC
  Administered 2021-02-21: 30 mg via INTRAMUSCULAR

## 2021-02-21 NOTE — Assessment & Plan Note (Signed)
Needs x-ray given CLL to rule out compression fracture. Given depo-medrol 40 mg IM and toradol 30 mg IM today. Rx prednisone and baclofen for pain. Depending on x-ray results may need change in treatment plan.

## 2021-02-21 NOTE — Patient Instructions (Signed)
We have given you a shot today with pain medicine and steroid medicine to help get rid of the pain.  We are doing an x-ray of the low back.   We have sent in prednisone to start taking 2 pills daily today for 5 days.   We have also sent in baclofen which is a muscle relaxer to help with pain until the steroid medicine kicks in to help. You can take this as needed up to 3 times a day.

## 2021-02-21 NOTE — Progress Notes (Signed)
   Subjective:   Patient ID: Jerry Gomez, male    DOB: 1931/05/04, 87 y.o.   MRN: [id number]  HPI The patient is an 87 YO man coming in for new lower back pain on the left which radiates down the left side. Denies falls or change in activity recently. Has not been very active this winter. Denies numbness or weakness in the legs. Does have pain with movement which especially limits getting in and out of cars. Overall stable since onset. Has tried tylenol without relief. Denies loss of bowels or bladder. Denies loss of sensation to bowels or bladder.   Review of Systems  Constitutional: Positive for activity change. Negative for appetite change, fatigue, fever and unexpected weight change.  Respiratory: Negative.   Cardiovascular: Negative.   Musculoskeletal: Positive for back pain and myalgias. Negative for arthralgias.  Skin: Negative.   Neurological: Negative for syncope, weakness and numbness.    Objective:  Physical Exam Constitutional:      Appearance: He is well-developed.  HENT:     Head: Normocephalic and atraumatic.  Cardiovascular:     Rate and Rhythm: Normal rate and regular rhythm.  Pulmonary:     Effort: Pulmonary effort is normal. No respiratory distress.     Breath sounds: Normal breath sounds. No wheezing or rales.  Abdominal:     General: Bowel sounds are normal. There is no distension.     Palpations: Abdomen is soft.     Tenderness: There is no abdominal tenderness. There is no rebound.  Musculoskeletal:        General: Tenderness present.     Cervical back: Normal range of motion.     Comments: Tenderness left SI region  Skin:    General: Skin is warm and dry.  Neurological:     Mental Status: He is alert and oriented to person, place, and time.     Coordination: Coordination normal.     Vitals:   02/21/21 0852  BP: 124/72  Pulse: 63  Resp: 18  Temp: 98.4 F (36.9 C)  TempSrc: Oral  SpO2: 98%  Weight: 151 lb 6.4 oz (68.7 kg)  Height: 5\' 8"   (1.727 m)    This visit occurred during the SARS-CoV-2 public health emergency.  Safety protocols were in place, including screening questions prior to the visit, additional usage of staff PPE, and extensive cleaning of exam room while observing appropriate contact time as indicated for disinfecting solutions.   Assessment & Plan:  Depo-medrol 40 mg IM and toradol 30 mg IM given at visit

## 2021-02-26 ENCOUNTER — Telehealth: Payer: Self-pay | Admitting: Internal Medicine

## 2021-02-26 NOTE — Telephone Encounter (Signed)
Please call patient with imaging results °

## 2021-02-26 NOTE — Telephone Encounter (Signed)
Unable to get in contact with the patient. LVM asking the patient to return my call. Office number was provided.

## 2021-03-02 ENCOUNTER — Telehealth: Payer: Self-pay | Admitting: Internal Medicine

## 2021-03-02 NOTE — Telephone Encounter (Signed)
Patient wondering if he can get a handicap placard because of his walking issues

## 2021-03-02 NOTE — Telephone Encounter (Signed)
Complete handicapp form place on MD to sign whn he return back in the office on Monday 3/28.Marland KitchenJohny Gomez

## 2021-03-05 NOTE — Telephone Encounter (Signed)
Notified pt MD completed form he states he actually went up to the [facility] on Friday and they gave him one.Marland KitchenJohny Chess

## 2021-03-12 ENCOUNTER — Ambulatory Visit (INDEPENDENT_AMBULATORY_CARE_PROVIDER_SITE_OTHER): Payer: Medicare Other | Admitting: Internal Medicine

## 2021-03-12 ENCOUNTER — Encounter: Payer: Self-pay | Admitting: Internal Medicine

## 2021-03-12 ENCOUNTER — Other Ambulatory Visit: Payer: Self-pay

## 2021-03-12 DIAGNOSIS — I10 Essential (primary) hypertension: Secondary | ICD-10-CM

## 2021-03-12 DIAGNOSIS — M5416 Radiculopathy, lumbar region: Secondary | ICD-10-CM | POA: Diagnosis not present

## 2021-03-12 DIAGNOSIS — C9110 Chronic lymphocytic leukemia of B-cell type not having achieved remission: Secondary | ICD-10-CM

## 2021-03-12 NOTE — Assessment & Plan Note (Signed)
X ray - Scoliosis with degenerative changes, most notable at L2-L3 Progressive weakness - will get an MRI Thx Start PT NS ref

## 2021-03-12 NOTE — Assessment & Plan Note (Signed)
MRI LS Spine CBC periodically

## 2021-03-12 NOTE — Progress Notes (Signed)
Subjective:  Patient ID: Jerry Gomez, male    DOB: [birth year]  Age: 87 y.o. MRN: [id number]  CC: Follow-up (Walking has gotten a lot worse. Denies pain while walking. Its more or less a fear of falling. Concerns for his balance. He has started using a cane since he was last seen by Dr. Sharlet Salina. )  Pain is 5/10 now  HPI Jerry Gomez presents for LBP, LLE weakness x 3 weeks Pain is 5/10 now, weakness is not better - L knee gives out F/u HTN  Outpatient Medications Prior to Visit  Medication Sig Dispense Refill  . Artificial Tear Ointment (ARTIFICIAL TEARS) ointment Place into both eyes nightly.    [name] aspirin 81 MG EC tablet Take 81 mg by mouth daily. Swallow whole.    [name] atorvastatin (LIPITOR) 20 MG tablet Take 1 tablet (20 mg total) by mouth daily. 90 tablet 1  . baclofen (LIORESAL) 10 MG tablet Take 1 tablet (10 mg total) by mouth 3 (three) times daily. 30 each 0  . Cholecalciferol (VITAMIN D) 2000 units tablet Take 2,000 Units by mouth daily.    . furosemide (LASIX) 20 MG tablet Take 1 tablet (20 mg total) by mouth daily. 90 tablet 1  . metoprolol succinate (TOPROL-XL) 50 MG 24 hr tablet TAKE 1 TABLET BY MOUTH DAILY. TAKE WITH OR IMMEDIATELY FOLLOWING A MEAL. 90 tablet 1  . Multiple Vitamins-Minerals (CENTRUM SILVER ULTRA MENS PO) Take 1 tablet by mouth.    . predniSONE (DELTASONE) 20 MG tablet Take 2 tablets (40 mg total) by mouth daily with breakfast. 10 tablet 0   No facility-administered medications prior to visit.    ROS: Review of Systems  Constitutional: Negative for appetite change, fatigue and unexpected weight change.  HENT: Negative for congestion, nosebleeds, sneezing, sore throat and trouble swallowing.   Eyes: Negative for itching and visual disturbance.  Respiratory: Negative for cough.   Cardiovascular: Negative for chest pain, palpitations and leg swelling.  Gastrointestinal: Negative for abdominal distention, blood in stool, diarrhea and nausea.   Genitourinary: Negative for frequency and hematuria.  Musculoskeletal: Positive for arthralgias and gait problem. Negative for back pain, joint swelling and neck pain.  Skin: Negative for rash.  Neurological: Negative for dizziness, tremors, speech difficulty and weakness.  Psychiatric/Behavioral: Negative for agitation, dysphoric mood and sleep disturbance. The patient is not nervous/anxious.     Objective:  BP 124/62   Pulse 85   Temp 97.9 F (36.6 C) (Oral)   Resp 18   Ht 5\' 8"  (1.727 m)   Wt 149 lb 3.2 oz (67.7 kg)   SpO2 97%   BMI 22.69 kg/m   BP Readings from Last 3 Encounters:  03/12/21 124/62  02/21/21 124/72  02/01/21 138/62    Wt Readings from Last 3 Encounters:  03/12/21 149 lb 3.2 oz (67.7 kg)  02/21/21 151 lb 6.4 oz (68.7 kg)  02/01/21 156 lb 6.4 oz (70.9 kg)    Physical Exam Constitutional:      General: He is not in acute distress.    Appearance: He is well-developed.     Comments: NAD  Eyes:     Conjunctiva/sclera: Conjunctivae normal.     Pupils: Pupils are equal, round, and reactive to light.  Neck:     Thyroid: No thyromegaly.     Vascular: No JVD.  Cardiovascular:     Rate and Rhythm: Normal rate and regular rhythm.     Heart sounds: Normal heart sounds. No murmur heard. No  friction rub. No gallop.   Pulmonary:     Effort: Pulmonary effort is normal. No respiratory distress.     Breath sounds: Normal breath sounds. No wheezing or rales.  Chest:     Chest wall: No tenderness.  Abdominal:     General: Bowel sounds are normal. There is no distension.     Palpations: Abdomen is soft. There is no mass.     Tenderness: There is no abdominal tenderness. There is no guarding or rebound.  Musculoskeletal:        General: Tenderness present. Normal range of motion.     Cervical back: Normal range of motion.  Lymphadenopathy:     Cervical: No cervical adenopathy.  Skin:    General: Skin is warm and dry.     Findings: No rash.  Neurological:      Mental Status: He is alert and oriented to person, place, and time.     Cranial Nerves: No cranial nerve deficit.     Motor: Weakness present. No abnormal muscle tone.     Coordination: Coordination abnormal.     Gait: Gait abnormal.     Deep Tendon Reflexes: Reflexes are normal and symmetric.  Psychiatric:        Behavior: Behavior normal.        Thought Content: Thought content normal.        Judgment: Judgment normal.     Lab Results  Component Value Date   WBC 42.3 Repeated and verified X2. (HH) 01/29/2021   HGB 13.5 01/29/2021   HCT 41.2 01/29/2021   PLT 138.0 (L) 01/29/2021   GLUCOSE 102 (H) 01/29/2021   CHOL 131 07/28/2020   TRIG 57.0 07/28/2020   HDL 61.80 07/28/2020   LDLCALC 58 07/28/2020   ALT 14 01/29/2021   AST 20 01/29/2021   NA 143 01/29/2021   K 4.2 01/29/2021   CL 104 01/29/2021   CREATININE 0.88 01/29/2021   BUN 17 01/29/2021   CO2 32 01/29/2021   TSH 1.87 07/28/2020   PSA 8.87 (H) 01/29/2021    No results found.  Assessment & Plan:    Walker Kehr, MD

## 2021-03-13 NOTE — Assessment & Plan Note (Signed)
Cont w/Metoprolol, furosemide

## 2021-03-22 ENCOUNTER — Ambulatory Visit
Admission: RE | Admit: 2021-03-22 | Discharge: 2021-03-22 | Disposition: A | Discharge status: Home / Self Care | Payer: Medicare Other | Source: Ambulatory Visit | Attending: Internal Medicine | Admitting: Internal Medicine

## 2021-03-22 DIAGNOSIS — M5416 Radiculopathy, lumbar region: Secondary | ICD-10-CM

## 2021-03-22 DIAGNOSIS — M545 Low back pain, unspecified: Secondary | ICD-10-CM | POA: Diagnosis not present

## 2021-03-22 DIAGNOSIS — M48061 Spinal stenosis, lumbar region without neurogenic claudication: Secondary | ICD-10-CM | POA: Diagnosis not present

## 2021-03-27 ENCOUNTER — Ambulatory Visit (INDEPENDENT_AMBULATORY_CARE_PROVIDER_SITE_OTHER): Payer: Medicare Other | Admitting: Internal Medicine

## 2021-03-27 ENCOUNTER — Other Ambulatory Visit: Payer: Self-pay

## 2021-03-27 ENCOUNTER — Encounter: Payer: Self-pay | Admitting: Internal Medicine

## 2021-03-27 DIAGNOSIS — M4807 Spinal stenosis, lumbosacral region: Secondary | ICD-10-CM | POA: Diagnosis not present

## 2021-03-27 DIAGNOSIS — M5416 Radiculopathy, lumbar region: Secondary | ICD-10-CM

## 2021-03-27 DIAGNOSIS — C9110 Chronic lymphocytic leukemia of B-cell type not having achieved remission: Secondary | ICD-10-CM | POA: Diagnosis not present

## 2021-03-27 DIAGNOSIS — M4800 Spinal stenosis, site unspecified: Secondary | ICD-10-CM | POA: Insufficient documentation

## 2021-03-27 MED ORDER — MELOXICAM 7.5 MG PO TABS: 7.5000 mg | ORAL_TABLET | Freq: Every day | ORAL | 1 refills | Status: DC

## 2021-03-27 NOTE — Progress Notes (Signed)
Subjective:  Patient ID: Jerry Gomez, male    DOB: Oct 13, 1931  Age: 87 y.o. MRN: [id number]  CC: Follow-up (2 week f/u)   HPI Jerry Gomez presents for L sciatica - some better. No falls. Has not started PT yet. No falls. F/u CLL  Outpatient Medications Prior to Visit  Medication Sig Dispense Refill  . Artificial Tear Ointment (ARTIFICIAL TEARS) ointment Place into both eyes nightly.    [name] aspirin 81 MG EC tablet Take 81 mg by mouth daily. Swallow whole.    [name] atorvastatin (LIPITOR) 20 MG tablet Take 1 tablet (20 mg total) by mouth daily. 90 tablet 1  . Cholecalciferol (VITAMIN D) 2000 units tablet Take 2,000 Units by mouth daily.    . furosemide (LASIX) 20 MG tablet Take 1 tablet (20 mg total) by mouth daily. 90 tablet 1  . metoprolol succinate (TOPROL-XL) 50 MG 24 hr tablet TAKE 1 TABLET BY MOUTH DAILY. TAKE WITH OR IMMEDIATELY FOLLOWING A MEAL. 90 tablet 1  . Multiple Vitamins-Minerals (CENTRUM SILVER ULTRA MENS PO) Take 1 tablet by mouth.    . baclofen (LIORESAL) 10 MG tablet Take 1 tablet (10 mg total) by mouth 3 (three) times daily. (Patient not taking: Reported on 03/27/2021) 30 each 0  . predniSONE (DELTASONE) 20 MG tablet Take 2 tablets (40 mg total) by mouth daily with breakfast. (Patient not taking: Reported on 03/27/2021) 10 tablet 0   No facility-administered medications prior to visit.    ROS: Review of Systems  Constitutional: Negative for appetite change, fatigue and unexpected weight change.  HENT: Negative for congestion, nosebleeds, sneezing, sore throat and trouble swallowing.   Eyes: Negative for itching and visual disturbance.  Respiratory: Negative for cough.   Cardiovascular: Negative for chest pain, palpitations and leg swelling.  Gastrointestinal: Negative for abdominal distention, blood in stool, diarrhea and nausea.  Genitourinary: Negative for frequency and hematuria.  Musculoskeletal: Positive for back pain and gait problem. Negative for joint swelling  and neck pain.  Skin: Negative for rash.  Neurological: Negative for dizziness, tremors, speech difficulty and weakness.  Psychiatric/Behavioral: Negative for agitation, dysphoric mood and sleep disturbance. The patient is not nervous/anxious.     Objective:  BP (!) 120/58 (BP Location: Left Arm)   Pulse 75   Temp 98.3 F (36.8 C) (Oral)   Ht 5\' 8"  (1.727 m)   Wt 147 lb (66.7 kg)   SpO2 97%   BMI 22.35 kg/m   BP Readings from Last 3 Encounters:  03/27/21 (!) 120/58  03/12/21 124/62  02/21/21 124/72    Wt Readings from Last 3 Encounters:  03/27/21 147 lb (66.7 kg)  03/12/21 149 lb 3.2 oz (67.7 kg)  02/21/21 151 lb 6.4 oz (68.7 kg)    Physical Exam Constitutional:      General: He is not in acute distress.    Appearance: He is well-developed.     Comments: NAD  Eyes:     Conjunctiva/sclera: Conjunctivae normal.     Pupils: Pupils are equal, round, and reactive to light.  Neck:     Thyroid: No thyromegaly.     Vascular: No JVD.  Cardiovascular:     Rate and Rhythm: Normal rate and regular rhythm.     Heart sounds: Normal heart sounds. No murmur heard. No friction rub. No gallop.   Pulmonary:     Effort: Pulmonary effort is normal. No respiratory distress.     Breath sounds: Normal breath sounds. No wheezing or rales.  Chest:  Chest wall: No tenderness.  Abdominal:     General: Bowel sounds are normal. There is no distension.     Palpations: Abdomen is soft. There is no mass.     Tenderness: There is no abdominal tenderness. There is no guarding or rebound.  Musculoskeletal:        General: Tenderness present. Normal range of motion.     Cervical back: Normal range of motion.  Lymphadenopathy:     Cervical: No cervical adenopathy.  Skin:    General: Skin is warm and dry.     Findings: No rash.  Neurological:     Mental Status: He is alert and oriented to person, place, and time.     Cranial Nerves: No cranial nerve deficit.     Motor: No abnormal muscle  tone.     Coordination: Coordination normal.     Gait: Gait normal.     Deep Tendon Reflexes: Reflexes are normal and symmetric.  Psychiatric:        Behavior: Behavior normal.        Thought Content: Thought content normal.        Judgment: Judgment normal.    using a cane  Lab Results  Component Value Date   WBC 42.3 Repeated and verified X2. (HH) 01/29/2021   HGB 13.5 01/29/2021   HCT 41.2 01/29/2021   PLT 138.0 (L) 01/29/2021   GLUCOSE 102 (H) 01/29/2021   CHOL 131 07/28/2020   TRIG 57.0 07/28/2020   HDL 61.80 07/28/2020   LDLCALC 58 07/28/2020   ALT 14 01/29/2021   AST 20 01/29/2021   NA 143 01/29/2021   K 4.2 01/29/2021   CL 104 01/29/2021   CREATININE 0.88 01/29/2021   BUN 17 01/29/2021   CO2 32 01/29/2021   TSH 1.87 07/28/2020   PSA 8.87 (H) 01/29/2021    MR Lumbar Spine Wo Contrast  Result Date: 03/23/2021 CLINICAL DATA:  Low back pain, progressive neurologic deficit EXAM: MRI LUMBAR SPINE WITHOUT CONTRAST TECHNIQUE: Multiplanar, multisequence MR imaging of the lumbar spine was performed. No intravenous contrast was administered. COMPARISON:  Lumbar radiographs February 21, 2021. FINDINGS: Segmentation: Standard segmentation is seen. The inferior-most fully formed intervertebral disc is labeled L5-S1. Alignment: Slight retrolisthesis of L1 on L2, L2 on L3 and L5 on S1. Vertebrae: Vertebral body heights are maintained. No specific evidence of acute fracture, discitis/osteomyelitis, or suspicious bone lesion. Conus medullaris and cauda equina: Conus extends to the T12 level. Conus and cauda equina appear normal. Paraspinal and other soft tissues: Partially imaged right renal cyst. Disc levels: T12-L1: No significant disc protrusion, foraminal stenosis, or canal stenosis. L1-L2: Slight retrolisthesis of L1 on L2. Mild left subarticular/foraminal disc protrusion and mild left greater than right facet hypertrophy with mild left foraminal stenosis. No significant canal stenosis.  L2-L3: Slight retrolisthesis of L2 on L3. Mild left greater than right facet hypertrophy and left foraminal disc protrusion with mild left foraminal stenosis and mild left subarticular recess stenosis. No significant canal or right foraminal stenosis. L3-L4: Mild bilateral facet hypertrophy and ligamentum flavum thickening without significant canal or foraminal stenosis. L4-L5: Disc bulging, moderate bilateral facet hypertrophy, and ligamentum flavum thickening. Resulting moderate canal stenosis and mild right foraminal stenosis. L5-S1: Central and right foraminal disc protrusions with moderate bilateral facet hypertrophy. No significant canal stenosis. Mild bilateral subarticular recess stenosis. Moderate right foraminal stenosis with far lateral disc contacting the exiting right L5 nerve. No significant left foraminal stenosis. IMPRESSION: 1. Moderate canal stenosis at L4-L5. 2.  Moderate right foraminal stenosis at L5-S1. Multilevel mild foraminal stenosis is detailed above. Electronically Signed   By: Margaretha Sheffield MD   On: 03/23/2021 16:37    Assessment & Plan:   There are no diagnoses linked to this encounter.   No orders of the defined types were placed in this encounter.    Follow-up: No follow-ups on file.  Walker Kehr, MD

## 2021-03-27 NOTE — Assessment & Plan Note (Addendum)
Some better, but not much. Reviewed w/pt and wife - LS MRI IMPRESSION: 1. Moderate canal stenosis at L4-L5. 2. Moderate right foraminal stenosis at L5-S1. Multilevel mild foraminal stenosis is detailed above. Electronically Signed   By: Margaretha Sheffield MD   On: 03/23/2021 16:37   Start PT Rx - Meloxicam - low dose w/caution

## 2021-03-27 NOTE — Assessment & Plan Note (Signed)
Rx - Meloxicam - low dose w/caution

## 2021-03-27 NOTE — Assessment & Plan Note (Signed)
Monitor CBC LS MRI - no tumor, etc

## 2021-04-09 ENCOUNTER — Encounter: Payer: Self-pay | Admitting: Physical Therapy

## 2021-04-09 ENCOUNTER — Other Ambulatory Visit: Payer: Self-pay

## 2021-04-09 ENCOUNTER — Ambulatory Visit: Payer: Medicare Other | Attending: Internal Medicine | Admitting: Physical Therapy

## 2021-04-09 DIAGNOSIS — R293 Abnormal posture: Secondary | ICD-10-CM | POA: Insufficient documentation

## 2021-04-09 DIAGNOSIS — M6281 Muscle weakness (generalized): Secondary | ICD-10-CM | POA: Insufficient documentation

## 2021-04-09 DIAGNOSIS — M5442 Lumbago with sciatica, left side: Secondary | ICD-10-CM | POA: Insufficient documentation

## 2021-04-09 NOTE — Therapy (Signed)
[place], [place], [address] Phone: [phone]   Fax:  [phone]  Physical Therapy Evaluation  Patient Details  Name: Jerry Gomez MRN: [id number] Date of Birth: Jun 28, 1931 Referring Provider (PT): [name]   Encounter Date: 04/09/2021   PT End of Session - 04/09/21 1338    Visit Number 1    Number of Visits 5    Date for PT Re-Evaluation 05/07/21    Authorization Type Medicare + BCBS supplement    PT Start Time 1338    PT Stop Time 1420    PT Time Calculation (min) 42 min    Activity Tolerance Patient tolerated treatment well    Behavior During Therapy [facility] for tasks assessed/performed           Past Medical History:  Diagnosis Date  . Cancer ([place])    CLL  . Cataract   . COLONIC POLYPS 03/02/2009  . EYE SURGERY, HX OF 09/05/2007  . HEMORRHOIDS, HX OF   . HYPERLIPIDEMIA 09/05/2007  . HYPERTENSION 07/25/2007  . OSTEOARTHRITIS 09/05/2007  . SLEEP APNEA, OBSTRUCTIVE 09/05/2007    Past Surgical History:  Procedure Laterality Date  . EYE SURGERY  10/22/13   left eye, cataract    There were no vitals filed for this visit.    Subjective Assessment - 04/09/21 1341    Subjective pt reports since 6 wks ago he has been having problems and thinks he might have injured his L back lifting something heavy. Over period of time his low back and left leg especially became weak and hurting. Pt with L leg numbness/tingling. Getting in/out of car is difficult. Pt states it has become much improved especially since meloxicam for a few weeks. Pt reports he does not typically use cane but has used it recently. He has been using it less and less now that things are improving.    Pertinent History HTN, lymphocytic leukemia, scoliosis    Limitations Standing;Walking    How long can you sit comfortably? intermittent    How long can you stand comfortably? intermittent now    How long can you walk comfortably?  intermittent now    Diagnostic tests MRI: moderate canal stenosis L4-5, moderate R foraminal stenosis at L5-S1.    Patient Stated Goals Continue to improve pain    Currently in Pain? Yes    Pain Score 1     Pain Location Leg    Pain Orientation Left    Pain Descriptors / Indicators Discomfort    Pain Type Acute pain    Aggravating Factors  In/out of car              [facility] PT Assessment - 04/09/21 0001      Assessment   Medical Diagnosis M54.16 (ICD-10-CM) - Lumbar radiculopathy    Referring Provider (PT) [name], aleksei    Prior Therapy None      Precautions   Precautions None      Restrictions   Weight Bearing Restrictions No      Balance Screen   Has the patient fallen in the past 6 months No      [place] residence    Living Arrangements Spouse/significant other    Type of [place] to enter    Entrance Stairs-Number of Steps [place] - single point  Prior Function   Level of Independence Independent    Vocation Retired    Leisure Golfing      Observation/Other Assessments   Focus on Therapeutic Outcomes (FOTO)  57; risk adjusted 52      Sensation   Light Touch Appears Intact      Posture/Postural Control   Posture/Postural Control Postural limitations    Postural Limitations Forward head;Increased thoracic kyphosis;Decreased lumbar lordosis    Posture Comments "sway back"      ROM / Strength   AROM / PROM / Strength AROM;Strength      AROM   Overall AROM Comments L>R hip decreased ROM    AROM Assessment Site Hip;Lumbar    Lumbar Flexion 90%   Can feel it in left leg   Lumbar Extension 10%    Lumbar - Right Side Bend 50%   performs with flexion   Lumbar - Left Side Bend 50%   performs with flexion   Lumbar - Right Rotation 10%    Lumbar - Left Rotation 10%      Strength   Strength Assessment Site Hip;Knee    Right/Left Hip Right;Left     Right Hip Flexion 5/5    Right Hip Extension 3-/5    Right Hip ABduction 3-/5    Left Hip Flexion 4-/5    Left Hip Extension 3-/5    Left Hip ABduction 3-/5    Right/Left Knee Right;Left    Right Knee Flexion 5/5    Right Knee Extension 5/5    Left Knee Flexion 4-/5    Left Knee Extension 4-/5      Palpation   Palpation comment no tenderness noted      Special Tests    Special Tests Lumbar    Lumbar Tests FABER test;Slump Test;Prone Knee Bend Test;Straight Leg Raise      FABER test   findings Positive    Side LEft      Slump test   Findings Negative      Prone Knee Bend Test   Findings Negative    Comment Increased stretch      Straight Leg Raise   Findings Negative                      Objective measurements completed on examination: See above findings.               PT Education - 04/09/21 1708    Education Details Discussed exam findings, POC, and HEP    Person(s) Educated Patient    Methods Explanation;Demonstration;Verbal cues;Handout    Comprehension Verbalized understanding;Returned demonstration;Verbal cues required;Tactile cues required               PT Long Term Goals - 04/09/21 1713      PT LONG TERM GOAL #1   Title Pt will be independent with HEP    Time 4    Period Weeks    Status New    Target Date 05/07/21      PT LONG TERM GOAL #2   Title Pt will report return to PLOF    Time 4    Period Weeks    Status New    Target Date 05/07/21      PT LONG TERM GOAL #3   Title Pt will be able to demo at least 4/5 bilat hip strength    Baseline 3-/5 bilat hip abduction and extension    Time 4    Period Weeks  Status New    Target Date 05/07/21                  Plan - 04/09/21 1703    Clinical Impression Statement Pt is an 87 y/o M presenting to OPPT due to complaint of back pain after lifting. Pt states his back pain has been improving and has not been having as much leg pain; however, pt demos  residual L>R LE weakness with limited lumbar and hip ROM and muscle tightness. Pt would benefit from PT to expedite his recovery and return to full strength.    Personal Factors and Comorbidities Age;Time since onset of injury/illness/exacerbation    Examination-Activity Limitations Bed Mobility;Stand;Sit;Squat;Stairs;Lift;Bend    Examination-Participation Restrictions Cleaning;Community Activity;Yard Work    [employer]chant [employer] officer Evolving/Moderate complexity    Clinical Decision Making Moderate    Rehab Potential Good    PT Frequency 1x / week    PT Duration 4 weeks    PT Treatment/Interventions ADLs/Self Care Home Management;Electrical Stimulation;Iontophoresis 4mg /ml Dexamethasone;Moist Heat;Ultrasound;Cryotherapy;Therapeutic activities;Functional mobility training;Stair training;Gait training;Therapeutic exercise;Balance training;Neuromuscular re-education;Patient/family education;Manual techniques;Passive range of motion;Taping;Dry needling    PT Next Visit Plan Assess response to HEP. Progress hip and core strengthening as able. Consider manual therapy to stretch hips. Continue hip and back stretching as needed.    PT Home Exercise Plan Access Code [email or web address]    Consulted and Agree with Plan of Care Patient           Patient will benefit from skilled therapeutic intervention in order to improve the following deficits and impairments:  Abnormal gait,Decreased balance,Decreased mobility,Hypomobility,Increased muscle spasms,Improper body mechanics,Decreased range of motion,Increased fascial restricitons,Postural dysfunction,Impaired flexibility,Decreased strength  Visit Diagnosis: Abnormal posture  Muscle weakness (generalized)  Acute left-sided low back pain with left-sided sciatica     Problem List Patient Active Problem List   Diagnosis Date Noted  . Spinal stenosis 03/27/2021  . Lumbar radiculopathy 03/12/2021  . Acute left-sided low back pain with left-sided  sciatica 02/21/2021  . Insomnia 10/28/2018  . Bradycardia 10/09/2016  . Sebaceous cyst 10/09/2016  . Carotid bruit 04/08/2016  . Acute sinusitis 11/01/2015  . Elevated PSA 04/10/2015  . Well adult exam 10/10/2014  . Bruising 10/10/2014  . CLL (chronic lymphocytic leukemia) ([place]) 06/09/2012  . Pain of right lower leg 02/07/2012  . Dyslipidemia 09/05/2007  . SLEEP APNEA, OBSTRUCTIVE 09/05/2007  . OSTEOARTHRITIS 09/05/2007  . Essential hypertension 07/25/2007    [facility] April Ma L Rayquon Uselman PT, DPT 04/09/2021, 5:18 PM  [facility] [address] [place], [place], [address] Phone: [phone]   Fax:  [phone]  Name: Jerry Gomez MRN: [id number] Date of Birth: 1931/01/20

## 2021-04-09 NOTE — Patient Instructions (Signed)
Access Code: [email or web address] URL: [email or web address] Date: 04/09/2021 Prepared by: Estill Bamberg April Thurnell Garbe  Exercises Seated Hamstring Stretch - 1 x daily - 7 x weekly - 3 sets - 20-30 sec hold Seated Table Piriformis Stretch - 1 x daily - 7 x weekly - 3 sets - 20-30 sec hold Standing Hip Flexor Stretch - 1 x daily - 7 x weekly - 3 sets - 20-30 sec hold Hip Abduction with Resistance Loop - 1 x daily - 7 x weekly - 2 sets - 10 reps Hip Extension with Resistance Loop - 1 x daily - 7 x weekly - 2 sets - 10 reps Standing Hip Flexion with Resistance Loop - 1 x daily - 7 x weekly - 2 sets - 10 reps

## 2021-04-12 ENCOUNTER — Other Ambulatory Visit: Payer: Self-pay | Admitting: Internal Medicine

## 2021-04-16 ENCOUNTER — Other Ambulatory Visit: Payer: Self-pay

## 2021-04-16 ENCOUNTER — Ambulatory Visit: Payer: Medicare Other | Admitting: Physical Therapy

## 2021-04-16 DIAGNOSIS — M5442 Lumbago with sciatica, left side: Secondary | ICD-10-CM | POA: Diagnosis not present

## 2021-04-16 DIAGNOSIS — M6281 Muscle weakness (generalized): Secondary | ICD-10-CM

## 2021-04-16 DIAGNOSIS — R293 Abnormal posture: Secondary | ICD-10-CM | POA: Diagnosis not present

## 2021-04-16 NOTE — Therapy (Signed)
[place], [place], [address] Phone: [phone]   Fax:  [phone]  Physical Therapy Treatment  Patient Details  Name: Jerry Gomez MRN: [id number] Date of Birth: 07-Feb-1931 Referring Provider (PT): [name]   Encounter Date: 04/16/2021   PT End of Session - 04/16/21 1414    Visit Number 2    Number of Visits 5    Date for PT Re-Evaluation 05/07/21    Authorization Type Medicare + BCBS supplement    PT Start Time 1315    PT Stop Time 1400    PT Time Calculation (min) 45 min    Activity Tolerance Patient tolerated treatment well    Behavior During Therapy [facility] for tasks assessed/performed           Past Medical History:  Diagnosis Date  . Cancer ([place])    CLL  . Cataract   . COLONIC POLYPS 03/02/2009  . EYE SURGERY, HX OF 09/05/2007  . HEMORRHOIDS, HX OF   . HYPERLIPIDEMIA 09/05/2007  . HYPERTENSION 07/25/2007  . OSTEOARTHRITIS 09/05/2007  . SLEEP APNEA, OBSTRUCTIVE 09/05/2007    Past Surgical History:  Procedure Laterality Date  . EYE SURGERY  10/22/13   left eye, cataract    There were no vitals filed for this visit.   Subjective Assessment - 04/16/21 1416    Subjective Pt states he was on the move a lot this weekend -- he had company over. Pt reports he was able to do some of the stretches.    Pertinent History HTN, lymphocytic leukemia, scoliosis    Limitations Standing;Walking    How long can you sit comfortably? intermittent    How long can you stand comfortably? intermittent now    How long can you walk comfortably? intermittent now    Diagnostic tests MRI: moderate canal stenosis L4-5, moderate R foraminal stenosis at L5-S1.    Patient Stated Goals Continue to improve pain    Currently in Pain? No/denies                             [facility] Adult PT Treatment/Exercise - 04/16/21 0001      Exercises   Exercises Lumbar      Lumbar Exercises: Stretches    Passive Hamstring Stretch Right;Left;30 seconds    Lower Trunk Rotation 2 reps;20 seconds    Hip Flexor Stretch Right;Left;20 seconds    Piriformis Stretch Right;Left;30 seconds    Figure 4 Stretch 30 seconds    Figure 4 Stretch Limitations on table    Other Lumbar Stretch Exercise L stretch x20 sec with side flexion x30 sec each      Lumbar Exercises: Aerobic   Nustep L5 x 5 min      Lumbar Exercises: Standing   Wall Slides 20 reps    Other Standing Lumbar Exercises 2x10 3 way hip red tband      Manual Therapy   Manual therapy comments Self massage with tennis ball along QL and piriformis/glutes                  PT Education - 04/16/21 1446    Education Details Discussed importance of stretching and self massage with tennis ball.    Person(s) Educated Patient    Methods Explanation;Demonstration;Verbal cues;Handout    Comprehension Verbalized understanding;Returned demonstration;Verbal cues required;Tactile cues required               PT Long  Term Goals - 04/09/21 1713      PT LONG TERM GOAL #1   Title Pt will be independent with HEP    Time 4    Period Weeks    Status New    Target Date 05/07/21      PT LONG TERM GOAL #2   Title Pt will report return to PLOF    Time 4    Period Weeks    Status New    Target Date 05/07/21      PT LONG TERM GOAL #3   Title Pt will be able to demo at least 4/5 bilat hip strength    Baseline 3-/5 bilat hip abduction and extension    Time 4    Period Weeks    Status New    Target Date 05/07/21      PT LONG TERM GOAL #4   Title Pt will have improved FOTO score to at least 71    Baseline 58, risk adjusted to 52    Time 4    Period Weeks    Status New    Target Date 05/07/21                 Plan - 04/16/21 1423    Clinical Impression Statement Treatment focused on reviewing HEP and updating as needed. Pt with L>R hip tightness. Provided manual therapy accordingly. Pain continues to improve.    Personal  Factors and Comorbidities Age;Time since onset of injury/illness/exacerbation    Examination-Activity Limitations Bed Mobility;Stand;Sit;Squat;Stairs;Lift;Bend    Examination-Participation Restrictions Cleaning;Community Activity;Yard Work    [employer]chant [employer] officer Evolving/Moderate complexity    Rehab Potential Good    PT Frequency 1x / week    PT Duration 4 weeks    PT Treatment/Interventions ADLs/Self Care Home Management;Electrical Stimulation;Iontophoresis 4mg /ml Dexamethasone;Moist Heat;Ultrasound;Cryotherapy;Therapeutic activities;Functional mobility training;Stair training;Gait training;Therapeutic exercise;Balance training;Neuromuscular re-education;Patient/family education;Manual techniques;Passive range of motion;Taping;Dry needling    PT Next Visit Plan Assess response to HEP. Progress hip and core strengthening as able. Consider manual therapy to stretch hips. Continue hip and back stretching as needed.    PT Home Exercise Plan Access Code [email or web address]    Consulted and Agree with Plan of Care Patient           Patient will benefit from skilled therapeutic intervention in order to improve the following deficits and impairments:  Abnormal gait,Decreased balance,Decreased mobility,Hypomobility,Increased muscle spasms,Improper body mechanics,Decreased range of motion,Increased fascial restricitons,Postural dysfunction,Impaired flexibility,Decreased strength  Visit Diagnosis: Abnormal posture  Muscle weakness (generalized)  Acute left-sided low back pain with left-sided sciatica     Problem List Patient Active Problem List   Diagnosis Date Noted  . Spinal stenosis 03/27/2021  . Lumbar radiculopathy 03/12/2021  . Acute left-sided low back pain with left-sided sciatica 02/21/2021  . Insomnia 10/28/2018  . Bradycardia 10/09/2016  . Sebaceous cyst 10/09/2016  . Carotid bruit 04/08/2016  . Acute sinusitis 11/01/2015  . Elevated PSA 04/10/2015  . Well adult exam  10/10/2014  . Bruising 10/10/2014  . CLL (chronic lymphocytic leukemia) ([place]) 06/09/2012  . Pain of right lower leg 02/07/2012  . Dyslipidemia 09/05/2007  . SLEEP APNEA, OBSTRUCTIVE 09/05/2007  . OSTEOARTHRITIS 09/05/2007  . Essential hypertension 07/25/2007    [facility] April Ma L Faren Florence  PT, DPT 04/16/2021, 2:58 PM  [facility] [address] [place], [place], [address] Phone: [phone]   Fax:  [phone]  Name: Jerry Gomez MRN: [id number] Date of Birth: [birth year]

## 2021-04-23 ENCOUNTER — Encounter: Payer: Self-pay | Admitting: Physical Therapy

## 2021-04-23 ENCOUNTER — Ambulatory Visit: Payer: Medicare Other | Admitting: Physical Therapy

## 2021-04-23 ENCOUNTER — Other Ambulatory Visit: Payer: Self-pay

## 2021-04-23 DIAGNOSIS — M5442 Lumbago with sciatica, left side: Secondary | ICD-10-CM

## 2021-04-23 DIAGNOSIS — M6281 Muscle weakness (generalized): Secondary | ICD-10-CM | POA: Diagnosis not present

## 2021-04-23 DIAGNOSIS — R293 Abnormal posture: Secondary | ICD-10-CM | POA: Diagnosis not present

## 2021-04-23 NOTE — Therapy (Signed)
[place], [place], [address] Phone: [phone]   Fax:  [phone]  Physical Therapy Treatment  Patient Details  Name: Jerry Gomez MRN: [id number] Date of Birth: 1931/01/01 Referring Provider (PT): [name]   Encounter Date: 04/23/2021   PT End of Session - 04/23/21 1416    Visit Number 3    Number of Visits 5    Date for PT Re-Evaluation 05/07/21    Authorization Type Medicare + BCBS supplement    PT Start Time 1403    PT Stop Time 1442    PT Time Calculation (min) 39 min    Activity Tolerance Patient tolerated treatment well    Behavior During Therapy [facility] for tasks assessed/performed           Past Medical History:  Diagnosis Date  . Cancer ([place])    CLL  . Cataract   . COLONIC POLYPS 03/02/2009  . EYE SURGERY, HX OF 09/05/2007  . HEMORRHOIDS, HX OF   . HYPERLIPIDEMIA 09/05/2007  . HYPERTENSION 07/25/2007  . OSTEOARTHRITIS 09/05/2007  . SLEEP APNEA, OBSTRUCTIVE 09/05/2007    Past Surgical History:  Procedure Laterality Date  . EYE SURGERY  10/22/13   left eye, cataract    There were no vitals filed for this visit.   Subjective Assessment - 04/23/21 1407    Subjective Patient reports he is doing alright. States he can feel minor pain in his legs. States he has been able to do some of his exercises but he has been busy so not able to do them a lot.    Patient Stated Goals Continue to improve pain    Currently in Pain? Yes    Pain Score 1     Pain Location Leg    Pain Orientation Left;Right    Pain Descriptors / Indicators Discomfort    Pain Type Acute pain    Pain Onset More than a month ago    Pain Frequency Constant              [facility] PT Assessment - 04/23/21 0001      Assessment   Next MD Visit 05/03/2021      Strength   Right Hip ABduction 3/5    Left Hip ABduction 3/5                         [facility] Adult PT Treatment/Exercise - 04/23/21 0001       Exercises   Exercises Lumbar      Lumbar Exercises: Stretches   Passive Hamstring Stretch 2 reps;20 seconds    Passive Hamstring Stretch Limitations seated edge of mat    Single Knee to Chest Stretch 2 reps;20 seconds    Single Knee to Chest Stretch Limitations supine    Lower Trunk Rotation 5 reps;10 seconds      Lumbar Exercises: Aerobic   Nustep L5 x 5 min with LE/UE      Lumbar Exercises: Standing   Row 20 reps   2 sets   Theraband Level (Row) Level 3 (Green)    Other Standing Lumbar Exercises Hip abduction and extension 2 x 15 each      Lumbar Exercises: Seated   Sit to Stand 10 reps   2 sets                 PT Education - 04/23/21 1415    Education Details HEP update    Person(s) Educated  Patient    Methods Explanation;Demonstration;Verbal cues;Handout;Tactile cues    Comprehension Returned demonstration;Verbalized understanding;Verbal cues required;Need further instruction;Tactile cues required               PT Long Term Goals - 04/09/21 1713      PT LONG TERM GOAL #1   Title Pt will be independent with HEP    Time 4    Period Weeks    Status New    Target Date 05/07/21      PT LONG TERM GOAL #2   Title Pt will report return to PLOF    Time 4    Period Weeks    Status New    Target Date 05/07/21      PT LONG TERM GOAL #3   Title Pt will be able to demo at least 4/5 bilat hip strength    Baseline 3-/5 bilat hip abduction and extension    Time 4    Period Weeks    Status New    Target Date 05/07/21      PT LONG TERM GOAL #4   Title Pt will have improved FOTO score to at least 71    Baseline 58, risk adjusted to 52    Time 4    Period Weeks    Status New    Target Date 05/07/21                 Plan - 04/23/21 1416    Clinical Impression Statement Patient tolerated therapy well with no adverse effects. Continued therapy focus on lumbar/hip flexibility and hip strength with good tolerance. Patient does continue to exhibit  hip and lumbar tightness that limit mobility and hip strength deficit, but to progress sit<>stand without using UE assist. Modified HEP and included postural strengthening with good tolerance. Patient would benefit from continued skilled PT to progress strength and mobility to reduce pain and maximize functional ability.    PT Treatment/Interventions ADLs/Self Care Home Management;Electrical Stimulation;Iontophoresis 4mg /ml Dexamethasone;Moist Heat;Ultrasound;Cryotherapy;Therapeutic activities;Functional mobility training;Stair training;Gait training;Therapeutic exercise;Balance training;Neuromuscular re-education;Patient/family education;Manual techniques;Passive range of motion;Taping;Dry needling    PT Next Visit Plan Assess response to HEP. Progress hip and core strengthening as able. Consider manual therapy to stretch hips. Continue hip and back stretching as needed.    PT Home Exercise Plan Access Code [email or web address]    Consulted and Agree with Plan of Care Patient           Patient will benefit from skilled therapeutic intervention in order to improve the following deficits and impairments:  Abnormal gait,Decreased balance,Decreased mobility,Hypomobility,Increased muscle spasms,Improper body mechanics,Decreased range of motion,Increased fascial restricitons,Postural dysfunction,Impaired flexibility,Decreased strength  Visit Diagnosis: Abnormal posture  Muscle weakness (generalized)  Acute left-sided low back pain with left-sided sciatica     Problem List Patient Active Problem List   Diagnosis Date Noted  . Spinal stenosis 03/27/2021  . Lumbar radiculopathy 03/12/2021  . Acute left-sided low back pain with left-sided sciatica 02/21/2021  . Insomnia 10/28/2018  . Bradycardia 10/09/2016  . Sebaceous cyst 10/09/2016  . Carotid bruit 04/08/2016  . Acute sinusitis 11/01/2015  . Elevated PSA 04/10/2015  . Well adult exam 10/10/2014  . Bruising 10/10/2014  . CLL (chronic lymphocytic  leukemia) ([place]) 06/09/2012  . Pain of right lower leg 02/07/2012  . Dyslipidemia 09/05/2007  . SLEEP APNEA, OBSTRUCTIVE 09/05/2007  . OSTEOARTHRITIS 09/05/2007  . Essential hypertension 07/25/2007    Hilda Blades, PT, DPT, LAT, ATC 04/23/21  2:45 PM Phone: [phone] Fax: [phone]   [facility] [address] [place], [place], [address] Phone: [phone]   Fax:  [phone]  Name: KOHLE WINNER MRN: [id number] Date of Birth: [birth year]

## 2021-04-30 ENCOUNTER — Encounter: Payer: Self-pay | Admitting: Physical Therapy

## 2021-04-30 ENCOUNTER — Ambulatory Visit: Payer: Medicare Other | Admitting: Physical Therapy

## 2021-04-30 ENCOUNTER — Other Ambulatory Visit: Payer: Self-pay

## 2021-04-30 DIAGNOSIS — R293 Abnormal posture: Secondary | ICD-10-CM | POA: Diagnosis not present

## 2021-04-30 DIAGNOSIS — M6281 Muscle weakness (generalized): Secondary | ICD-10-CM | POA: Diagnosis not present

## 2021-04-30 DIAGNOSIS — M5442 Lumbago with sciatica, left side: Secondary | ICD-10-CM

## 2021-04-30 NOTE — Therapy (Addendum)
[place], [place], [address] Phone: [phone]   Fax:  [phone]  Physical Therapy Treatment / ERO / Discharge  Patient Details  Name: Jerry Gomez MRN: [id number] Date of Birth: Sep 02, 1931 Referring Provider (PT): [name]   Encounter Date: 04/30/2021   PT End of Session - 04/30/21 1055     Visit Number 4    Number of Visits 10    Date for PT Re-Evaluation 06/11/21    Authorization Type Medicare + BCBS supplement    Progress Note Due on Visit 10    PT Start Time 1046    PT Stop Time 1130    PT Time Calculation (min) 44 min    Activity Tolerance Patient tolerated treatment well    Behavior During Therapy [facility] for tasks assessed/performed             Past Medical History:  Diagnosis Date   Cancer ([place])    CLL   Cataract    COLONIC POLYPS 03/02/2009   EYE SURGERY, HX OF 09/05/2007   HEMORRHOIDS, HX OF    HYPERLIPIDEMIA 09/05/2007   HYPERTENSION 07/25/2007   OSTEOARTHRITIS 09/05/2007   SLEEP APNEA, OBSTRUCTIVE 09/05/2007    Past Surgical History:  Procedure Laterality Date   EYE SURGERY  10/22/13   left eye, cataract    There were no vitals filed for this visit.   Subjective Assessment - 04/30/21 1053     Subjective Patient reports only issue is still feeling some leg weakness that occurs primarily going in/out of the house. He reports he tries to do some of his exercises every day but there are times he doesn't get to them. He reports he will be out of town for the next few weeks and does not want to schedule any future appointments at this time.    Patient Stated Goals Continue to improve pain    Currently in Pain? No/denies                [facility] PT Assessment - 04/30/21 0001       Assessment   Medical Diagnosis M54.16 (ICD-10-CM) - Lumbar radiculopathy    Referring Provider (PT) [name], aleksei    Next MD Visit 05/03/2021      Precautions   Precautions None       Restrictions   Weight Bearing Restrictions No      Balance Screen   Has the patient fallen in the past 6 months No      Prior Function   Level of Independence Independent      Observation/Other Assessments   Focus on Therapeutic Outcomes (FOTO)  58% functional status      Posture/Postural Control   Posture Comments Rounded shoulder and forward head posture      ROM / Strength   AROM / PROM / Strength Strength      Strength   Strength Assessment Site Knee;Hip    Right Hip Flexion 5/5    Right Hip Extension 3/5    Right Hip ABduction 3/5    Left Hip Flexion 5/5    Left Hip Extension 3/5    Left Hip ABduction 3/5    Right Knee Flexion 5/5    Right Knee Extension 5/5    Left Knee Flexion 5/5    Left Knee Extension 5/5  [facility] Adult PT Treatment/Exercise - 04/30/21 0001       Self-Care   Self-Care Other Self-Care Comments    Other Self-Care Comments  FOTO, exam findings and POC update      Neuro Re-ed    Neuro Re-ed Details  3/4 tandem stance 2 x 20 sec each      Exercises   Exercises Lumbar      Lumbar Exercises: Stretches   Passive Hamstring Stretch 20 seconds    Passive Hamstring Stretch Limitations seated edge of mat      Lumbar Exercises: Aerobic   Nustep L5 x 5 min with LE/UE      Lumbar Exercises: Standing   Other Standing Lumbar Exercises Hip abduction and extension 2 x 10 each    Other Standing Lumbar Exercises Step-up with 8" step height and LUE support 2 x 5 each      Lumbar Exercises: Seated   Sit to Stand 10 reps   2 sets                   PT Education - 04/30/21 1054     Education Details POC and HEP update, FOTO    Person(s) Educated Patient    Methods Explanation;Demonstration;Verbal cues;Handout    Comprehension Verbalized understanding;Need further instruction;Returned demonstration;Verbal cues required                 PT Long Term Goals - 04/30/21 1333       PT LONG  TERM GOAL #1   Title Pt will be independent with HEP    Baseline Patient continuing to progress with HEP and requires cueing for consistency    Time 6    Period Weeks    Status On-going    Target Date 06/11/21      PT LONG TERM GOAL #2   Title Pt will report return to PLOF    Baseline Patient continues to report leg weakness that limits stair negotiation and mobility    Time 6    Period Weeks    Status On-going    Target Date 06/11/21      PT LONG TERM GOAL #3   Title Pt will be able to demo at least 4/5 bilat hip strength    Baseline 3/5 MMT bilat hip abduction and extension    Time 6    Period Weeks    Status New    Target Date 06/11/21      PT LONG TERM GOAL #4   Title Pt will have improved FOTO score to at least 71    Baseline 58% functional status    Time 6    Period Weeks    Status On-going    Target Date 06/11/21                   Plan - 04/30/21 1331     Clinical Impression Statement Patient tolerated therapy well with no adverse effects. He demonstrates progress towards goals but continues to exhibit gross hip weakness and reports limitation with functional status on FOTO. Patient reporting no pain but just with continued weakness, therapy focused on strength progression and incorporating balance exercise for HEP. Updated HEP with good tolerance and encouraged patient to improve consistency with exercise. He will be out of town for the next few weeks, and stated he would like to hold off on scheduling further visits until he follows-up with his PCP, but patient encouraged to schedule further visits. Patient would benefit from continued  skilled PT to progress strength and mobility to maximize functional ability with stairs and walking.    PT Frequency 1x / week    PT Duration 6 weeks    PT Treatment/Interventions ADLs/Self Care Home Management;Electrical Stimulation;Iontophoresis 40m/ml Dexamethasone;Moist Heat;Ultrasound;Cryotherapy;Therapeutic  activities;Functional mobility training;Stair training;Gait training;Therapeutic exercise;Balance training;Neuromuscular re-education;Patient/family education;Manual techniques;Passive range of motion;Taping;Dry needling    PT Next Visit Plan Assess response to HEP. Progress hip and core strengthening as able. Consider manual therapy to stretch hips. Continue hip and back stretching as needed. Balance training as needed    PT Home Exercise Plan [email or web address]   Consulted and Agree with Plan of Care Patient             Patient will benefit from skilled therapeutic intervention in order to improve the following deficits and impairments:  Abnormal gait,Decreased balance,Decreased mobility,Hypomobility,Increased muscle spasms,Improper body mechanics,Decreased range of motion,Increased fascial restricitons,Postural dysfunction,Impaired flexibility,Decreased strength  Visit Diagnosis: Muscle weakness (generalized)  Abnormal posture  Acute left-sided low back pain with left-sided sciatica     Problem List Patient Active Problem List   Diagnosis Date Noted   Spinal stenosis 03/27/2021   Lumbar radiculopathy 03/12/2021   Acute left-sided low back pain with left-sided sciatica 02/21/2021   Insomnia 10/28/2018   Bradycardia 10/09/2016   Sebaceous cyst 10/09/2016   Carotid bruit 04/08/2016   Acute sinusitis 11/01/2015   Elevated PSA 04/10/2015   Well adult exam 10/10/2014   Bruising 10/10/2014   C[place] ([place]ocytic [place] 06/09/2012   Pain of right lower leg 02/07/2012   Dyslipidemia 09/05/2007   SLEEP APNEA, OBSTRUCTIVE 09/05/2007   OSTEOARTHRITIS 09/05/2007   Essential hypertension 07/25/2007    CHilda Blades PT, DPT, LAT, ATC 04/30/21  1:45 PM Phone: [phone]: [facility] [address] [address] [place] [address]Phone: [phone]  Fax:  [phone] Name: BBRISTOL SOYMRN:  [id number] of Birth: 808/28/1932  PHYSICAL THERAPY DISCHARGE SUMMARY  Visits from Start of Care: 4  Current functional level related to goals / functional outcomes: See above   Remaining deficits: See above   Education / Equipment: HEP   Patient agrees to discharge. Patient goals were partially met. Patient is being discharged due to not returning since the last visit.

## 2021-04-30 NOTE — Patient Instructions (Signed)
Access Code: [email or web address] URL: [email or web address] Date: 04/30/2021 Prepared by: Hilda Blades  Exercises Supine Lower Trunk Rotation - 1 x daily - 7 x weekly - 10 sec hold - 5 reps Hooklying Single Knee to Chest Stretch - 1 x daily - 7 x weekly - 2 reps - 20 hold Seated Hamstring Stretch - 1 x daily - 7 x weekly - 2 sets - 20 hold Sit to Stand with Arms Crossed - 1 x daily - 7 x weekly - 2 sets - 10 reps Standing Hip Abduction with Counter Support - 1 x daily - 7 x weekly - 2 sets - 15 reps Standing Hip Extension with Counter Support - 1 x daily - 7 x weekly - 2 sets - 15 reps Standing Row with Anchored Resistance - 1 x daily - 7 x weekly - 2 sets - 20 reps Forward Step Up - 1 x daily - 7 x weekly - 3 sets - 5 reps Standing Romberg to 3/4 Tandem Stance - 1 x daily - 7 x weekly - 3 reps - 20-30 seconds hold

## 2021-05-03 ENCOUNTER — Ambulatory Visit: Payer: Medicare Other | Admitting: Internal Medicine

## 2021-05-08 ENCOUNTER — Ambulatory Visit: Payer: Medicare Other | Admitting: Internal Medicine

## 2021-05-15 ENCOUNTER — Other Ambulatory Visit: Payer: Self-pay

## 2021-05-15 ENCOUNTER — Ambulatory Visit (INDEPENDENT_AMBULATORY_CARE_PROVIDER_SITE_OTHER): Payer: Medicare Other | Admitting: Internal Medicine

## 2021-05-15 ENCOUNTER — Encounter: Payer: Self-pay | Admitting: Internal Medicine

## 2021-05-15 DIAGNOSIS — I10 Essential (primary) hypertension: Secondary | ICD-10-CM | POA: Diagnosis not present

## 2021-05-15 DIAGNOSIS — M4807 Spinal stenosis, lumbosacral region: Secondary | ICD-10-CM | POA: Diagnosis not present

## 2021-05-15 NOTE — Patient Instructions (Signed)
Trekking poles 

## 2021-05-15 NOTE — Assessment & Plan Note (Signed)
Better after PT

## 2021-05-15 NOTE — Assessment & Plan Note (Signed)
Cont w/Metoprolol, furosemide

## 2021-05-15 NOTE — Progress Notes (Signed)
Subjective:  Patient ID: Jerry Gomez, male    DOB: 1931-05-29  Age: 87 y.o. MRN: [id number]  CC: Follow-up (6 week f/u)   HPI Jerry Gomez presents for L sciatica - better after PT. F/u HTN  Outpatient Medications Prior to Visit  Medication Sig Dispense Refill   Artificial Tear Ointment (ARTIFICIAL TEARS) ointment Place into both eyes nightly.     aspirin 81 MG EC tablet Take 81 mg by mouth daily. Swallow whole.     atorvastatin (LIPITOR) 20 MG tablet TAKE 1 TABLET BY MOUTH EVERY DAY 90 tablet 1   Cholecalciferol (VITAMIN D) 2000 units tablet Take 2,000 Units by mouth daily.     furosemide (LASIX) 20 MG tablet TAKE 1 TABLET BY MOUTH EVERY DAY 90 tablet 1   metoprolol succinate (TOPROL-XL) 50 MG 24 hr tablet TAKE 1 TABLET BY MOUTH DAILY. TAKE WITH OR IMMEDIATELY FOLLOWING A MEAL. 90 tablet 1   Multiple Vitamins-Minerals (CENTRUM SILVER ULTRA MENS PO) Take 1 tablet by mouth.     meloxicam (MOBIC) 7.5 MG tablet Take 1 tablet (7.5 mg total) by mouth daily. Take pc (Patient not taking: Reported on 05/15/2021) 14 tablet 1   No facility-administered medications prior to visit.    ROS: Review of Systems  Constitutional: Negative for appetite change, fatigue and unexpected weight change.  HENT: Negative for congestion, nosebleeds, sneezing, sore throat and trouble swallowing.   Eyes: Negative for itching and visual disturbance.  Respiratory: Negative for cough.   Cardiovascular: Negative for chest pain, palpitations and leg swelling.  Gastrointestinal: Negative for abdominal distention, blood in stool, diarrhea and nausea.  Genitourinary: Negative for frequency and hematuria.  Musculoskeletal: Positive for back pain and gait problem. Negative for joint swelling and neck pain.  Skin: Negative for rash.  Neurological: Negative for dizziness, tremors, speech difficulty and weakness.  Psychiatric/Behavioral: Negative for agitation, dysphoric mood and sleep disturbance. The patient is not  nervous/anxious.     Objective:  BP 130/60 (BP Location: Left Arm)   Pulse 75   Temp 98.3 F (36.8 C) (Oral)   Ht 5\' 8"  (1.727 m)   Wt 142 lb 13.6 oz (64.8 kg)   SpO2 97%   BMI 21.72 kg/m   BP Readings from Last 3 Encounters:  05/15/21 130/60  03/27/21 (!) 120/58  03/12/21 124/62    Wt Readings from Last 3 Encounters:  05/15/21 142 lb 13.6 oz (64.8 kg)  03/27/21 147 lb (66.7 kg)  03/12/21 149 lb 3.2 oz (67.7 kg)    Physical Exam Constitutional:      General: He is not in acute distress.    Appearance: He is well-developed.     Comments: NAD  Eyes:     Conjunctiva/sclera: Conjunctivae normal.     Pupils: Pupils are equal, round, and reactive to light.  Neck:     Thyroid: No thyromegaly.     Vascular: No JVD.  Cardiovascular:     Rate and Rhythm: Normal rate and regular rhythm.     Heart sounds: Normal heart sounds. No murmur heard. No friction rub. No gallop.   Pulmonary:     Effort: Pulmonary effort is normal. No respiratory distress.     Breath sounds: Normal breath sounds. No wheezing or rales.  Chest:     Chest wall: No tenderness.  Abdominal:     General: Bowel sounds are normal. There is no distension.     Palpations: Abdomen is soft. There is no mass.  Tenderness: There is no abdominal tenderness. There is no guarding or rebound.  Musculoskeletal:        General: No tenderness. Normal range of motion.     Cervical back: Normal range of motion.  Lymphadenopathy:     Cervical: No cervical adenopathy.  Skin:    General: Skin is warm and dry.     Findings: No rash.  Neurological:     Mental Status: He is alert and oriented to person, place, and time.     Cranial Nerves: No cranial nerve deficit.     Motor: No abnormal muscle tone.     Coordination: Coordination normal.     Gait: Gait normal.     Deep Tendon Reflexes: Reflexes are normal and symmetric.  Psychiatric:        Behavior: Behavior normal.        Thought Content: Thought content normal.         Judgment: Judgment normal.     Lab Results  Component Value Date   WBC 42.3 Repeated and verified X2. (HH) 01/29/2021   HGB 13.5 01/29/2021   HCT 41.2 01/29/2021   PLT 138.0 (L) 01/29/2021   GLUCOSE 102 (H) 01/29/2021   CHOL 131 07/28/2020   TRIG 57.0 07/28/2020   HDL 61.80 07/28/2020   LDLCALC 58 07/28/2020   ALT 14 01/29/2021   AST 20 01/29/2021   NA 143 01/29/2021   K 4.2 01/29/2021   CL 104 01/29/2021   CREATININE 0.88 01/29/2021   BUN 17 01/29/2021   CO2 32 01/29/2021   TSH 1.87 07/28/2020   PSA 8.87 (H) 01/29/2021    MR Lumbar Spine Wo Contrast  Result Date: 03/23/2021 CLINICAL DATA:  Low back pain, progressive neurologic deficit EXAM: MRI LUMBAR SPINE WITHOUT CONTRAST TECHNIQUE: Multiplanar, multisequence MR imaging of the lumbar spine was performed. No intravenous contrast was administered. COMPARISON:  Lumbar radiographs February 21, 2021. FINDINGS: Segmentation: Standard segmentation is seen. The inferior-most fully formed intervertebral disc is labeled L5-S1. Alignment: Slight retrolisthesis of L1 on L2, L2 on L3 and L5 on S1. Vertebrae: Vertebral body heights are maintained. No specific evidence of acute fracture, discitis/osteomyelitis, or suspicious bone lesion. Conus medullaris and cauda equina: Conus extends to the T12 level. Conus and cauda equina appear normal. Paraspinal and other soft tissues: Partially imaged right renal cyst. Disc levels: T12-L1: No significant disc protrusion, foraminal stenosis, or canal stenosis. L1-L2: Slight retrolisthesis of L1 on L2. Mild left subarticular/foraminal disc protrusion and mild left greater than right facet hypertrophy with mild left foraminal stenosis. No significant canal stenosis. L2-L3: Slight retrolisthesis of L2 on L3. Mild left greater than right facet hypertrophy and left foraminal disc protrusion with mild left foraminal stenosis and mild left subarticular recess stenosis. No significant canal or right foraminal  stenosis. L3-L4: Mild bilateral facet hypertrophy and ligamentum flavum thickening without significant canal or foraminal stenosis. L4-L5: Disc bulging, moderate bilateral facet hypertrophy, and ligamentum flavum thickening. Resulting moderate canal stenosis and mild right foraminal stenosis. L5-S1: Central and right foraminal disc protrusions with moderate bilateral facet hypertrophy. No significant canal stenosis. Mild bilateral subarticular recess stenosis. Moderate right foraminal stenosis with far lateral disc contacting the exiting right L5 nerve. No significant left foraminal stenosis. IMPRESSION: 1. Moderate canal stenosis at L4-L5. 2. Moderate right foraminal stenosis at L5-S1. Multilevel mild foraminal stenosis is detailed above. Electronically Signed   By: Margaretha Sheffield MD   On: 03/23/2021 16:37    Assessment & Plan:    Walker Kehr,  MD

## 2021-07-25 ENCOUNTER — Other Ambulatory Visit: Payer: Medicare Other

## 2021-07-26 ENCOUNTER — Telehealth: Payer: Self-pay | Admitting: Internal Medicine

## 2021-07-26 NOTE — Telephone Encounter (Signed)
Scheduled for 08/14/2021

## 2021-07-26 NOTE — Telephone Encounter (Signed)
Called pt to schedule AWV with [facility]. But voice mail box was not set up to [facility]

## 2021-07-30 ENCOUNTER — Inpatient Hospital Stay: Payer: Medicare Other | Attending: Internal Medicine | Admitting: Internal Medicine

## 2021-07-30 ENCOUNTER — Other Ambulatory Visit: Payer: Self-pay

## 2021-07-30 ENCOUNTER — Inpatient Hospital Stay: Payer: Medicare Other

## 2021-07-30 VITALS — BP 128/58 | HR 64 | Temp 97.8°F | Resp 18 | Ht 68.0 in | Wt 140.1 lb

## 2021-07-30 DIAGNOSIS — G4733 Obstructive sleep apnea (adult) (pediatric): Secondary | ICD-10-CM | POA: Insufficient documentation

## 2021-07-30 DIAGNOSIS — Z79899 Other long term (current) drug therapy: Secondary | ICD-10-CM | POA: Insufficient documentation

## 2021-07-30 DIAGNOSIS — Z7982 Long term (current) use of aspirin: Secondary | ICD-10-CM | POA: Diagnosis not present

## 2021-07-30 DIAGNOSIS — E785 Hyperlipidemia, unspecified: Secondary | ICD-10-CM | POA: Insufficient documentation

## 2021-07-30 DIAGNOSIS — C9110 Chronic lymphocytic leukemia of B-cell type not having achieved remission: Secondary | ICD-10-CM

## 2021-07-30 DIAGNOSIS — I10 Essential (primary) hypertension: Secondary | ICD-10-CM | POA: Diagnosis not present

## 2021-07-30 LAB — CBC WITH DIFFERENTIAL (CANCER CENTER ONLY)
Abs Immature Granulocytes: 0.04 10*3/uL (ref 0.00–0.07)
Basophils Absolute: 0.1 10*3/uL (ref 0.0–0.1)
Basophils Relative: 0 %
Eosinophils Absolute: 0.1 10*3/uL (ref 0.0–0.5)
Eosinophils Relative: 0 %
HCT: 41.1 % (ref 39.0–52.0)
Hemoglobin: 13.2 g/dL (ref 13.0–17.0)
Immature Granulocytes: 0 %
Lymphocytes Relative: 88 %
Lymphs Abs: 30 10*3/uL — ABNORMAL HIGH (ref 0.7–4.0)
MCH: 31 pg (ref 26.0–34.0)
MCHC: 32.1 g/dL (ref 30.0–36.0)
MCV: 96.5 fL (ref 80.0–100.0)
Monocytes Absolute: 1.5 10*3/uL — ABNORMAL HIGH (ref 0.1–1.0)
Monocytes Relative: 5 %
Neutro Abs: 2.3 10*3/uL (ref 1.7–7.7)
Neutrophils Relative %: 7 %
Platelet Count: 150 10*3/uL (ref 150–400)
RBC: 4.26 MIL/uL (ref 4.22–5.81)
RDW: 13.2 % (ref 11.5–15.5)
WBC Count: 34 10*3/uL — ABNORMAL HIGH (ref 4.0–10.5)
nRBC: 0 % (ref 0.0–0.2)

## 2021-07-30 LAB — CMP (CANCER CENTER ONLY)
ALT: 13 U/L (ref 0–44)
AST: 18 U/L (ref 15–41)
Albumin: 4 g/dL (ref 3.5–5.0)
Alkaline Phosphatase: 99 U/L (ref 38–126)
Anion gap: 8 (ref 5–15)
BUN: 15 mg/dL (ref 8–23)
CO2: 30 mmol/L (ref 22–32)
Calcium: 9.5 mg/dL (ref 8.9–10.3)
Chloride: 104 mmol/L (ref 98–111)
Creatinine: 0.87 mg/dL (ref 0.61–1.24)
GFR, Estimated: >60 mL/min (ref >60)
Glucose, Bld: 103 mg/dL — ABNORMAL HIGH (ref 70–99)
Potassium: 5.1 mmol/L (ref 3.5–5.1)
Sodium: 142 mmol/L (ref 135–145)
Total Bilirubin: 1.5 mg/dL — ABNORMAL HIGH (ref 0.3–1.2)
Total Protein: 6.3 g/dL — ABNORMAL LOW (ref 6.5–8.1)

## 2021-07-30 LAB — LACTATE DEHYDROGENASE: LDH: 173 U/L (ref 98–192)

## 2021-07-30 NOTE — Progress Notes (Signed)
[facility] [phone]   [phone]  OFFICE PROGRESS NOTE  [name], [name], MD [place] [address]  DIAGNOSIS: Stage 0 Chronic lymphocytic leukemia   PRIOR THERAPY: None   CURRENT THERAPY: Observation  INTERVAL HISTORY: Jerry Gomez 87 y.o. male returns to the clinic today for 27-monthfollow-up visit.  The patient is feeling fine today with no concerning complaints.  The patient denied having any chest pain, shortness of breath, cough or hemoptysis.  He denied having any nausea, vomiting, diarrhea or constipation.  He has no headache or visual changes.  He denied having any fever or chills.  The patient denied having any significant weight loss or night sweats.  He is here today for evaluation and repeat blood work.  MEDICAL HISTORY: Past Medical History:  Diagnosis Date   Cancer ([facility]    CLL   Cataract    COLONIC POLYPS 03/02/2009   EYE SURGERY, HX OF 09/05/2007   HEMORRHOIDS, HX OF    HYPERLIPIDEMIA 09/05/2007   HYPERTENSION 07/25/2007   OSTEOARTHRITIS 09/05/2007   SLEEP APNEA, OBSTRUCTIVE 09/05/2007    ALLERGIES:  has No Known Allergies.  MEDICATIONS:  Current Outpatient Medications  Medication Sig Dispense Refill   Artificial Tear Ointment (ARTIFICIAL TEARS) ointment Place into both eyes nightly.     aspirin 81 MG EC tablet Take 81 mg by mouth daily. Swallow whole.     atorvastatin (LIPITOR) 20 MG tablet TAKE 1 TABLET BY MOUTH EVERY DAY 90 tablet 1   Cholecalciferol (VITAMIN D) 2000 units tablet Take 2,000 Units by mouth daily.     furosemide (LASIX) 20 MG tablet TAKE 1 TABLET BY MOUTH EVERY DAY 90 tablet 1   metoprolol succinate (TOPROL-XL) 50 MG 24 hr tablet TAKE 1 TABLET BY MOUTH DAILY. TAKE WITH OR IMMEDIATELY FOLLOWING A MEAL. 90 tablet 1   Multiple Vitamins-Minerals (CENTRUM SILVER ULTRA MENS PO) Take 1 tablet by mouth.     No current facility-administered medications for this visit.    SURGICAL  HISTORY:  Past Surgical History:  Procedure Laterality Date   EYE SURGERY  10/22/13   left eye, cataract    REVIEW OF SYSTEMS:  A comprehensive review of systems was negative except for: Constitutional: positive for fatigue   PHYSICAL EXAMINATION: General appearance: alert, cooperative, and no distress Head: Normocephalic, without obvious abnormality, atraumatic Neck: no adenopathy Lymph nodes: Cervical, supraclavicular, and axillary nodes normal. Resp: clear to auscultation bilaterally Back: symmetric, no curvature. ROM normal. No CVA tenderness. Cardio: regular rate and rhythm, S1, S2 normal, no murmur, click, rub or gallop GI: soft, non-tender; bowel sounds normal; no masses,  no organomegaly Extremities: extremities normal, atraumatic, no cyanosis or edema  ECOG PERFORMANCE STATUS: 1 - Symptomatic but completely ambulatory  Blood pressure (!) 128/58, pulse 64, temperature 97.8 F (36.6 C), temperature source Tympanic, resp. rate 18, height '5\' 8"'$  (1.727 m), weight 140 lb 1.6 oz (63.5 kg), SpO2 100 %.  LABORATORY DATA: Lab Results  Component Value Date   WBC 42.3 Repeated and verified X2. (HH) 01/29/2021   HGB 13.5 01/29/2021   HCT 41.2 01/29/2021   MCV 95.9 01/29/2021   PLT 138.0 (L) 01/29/2021      Chemistry      Component Value Date/Time   NA 143 01/29/2021 0957   NA 141 06/17/2017 1259   K 4.2 01/29/2021 0957   K 4.5 06/17/2017 1259   CL 104 01/29/2021 0957   CL 104  12/14/2012 1031   CO2 32 01/29/2021 0957   CO2 30 (H) 06/17/2017 1259   BUN 17 01/29/2021 0957   BUN 16.4 06/17/2017 1259   CREATININE 0.88 01/29/2021 0957   CREATININE 0.93 01/25/2021 1053   CREATININE 1.1 06/17/2017 1259      Component Value Date/Time   CALCIUM 9.1 01/29/2021 0957   CALCIUM 9.8 06/17/2017 1259   ALKPHOS 82 01/29/2021 0957   ALKPHOS 75 06/17/2017 1259   AST 20 01/29/2021 0957   AST 20 01/25/2021 1053   AST 29 06/17/2017 1259   ALT 14 01/29/2021 0957   ALT 17 01/25/2021  1053   ALT 27 06/17/2017 1259   BILITOT 1.5 (H) 01/29/2021 0957   BILITOT 1.6 (H) 01/25/2021 1053   BILITOT 2.19 (H) 06/17/2017 1259       RADIOGRAPHIC STUDIES: No results found.  ASSESSMENT AND PLAN:  This is a very pleasant 87 years old white male with a stage 0 chronic lymphocytic leukemia.  The patient has been on observation for several years with no concerning complaints. The patient is feeling fine today with no concerning complaints. Repeat CBC showed improvement of his total white blood count compared to 6 months ago. I recommended for him to continue on observation with repeat CBC, comprehensive metabolic panel and LDH in 6 months. The patient was advised to call immediately if he has any other concerning symptoms in the interval. All questions were answered. The patient knows to call the clinic with any problems, questions or concerns. We can certainly see the patient much sooner if necessary.  Disclaimer: This note was dictated with voice recognition software. Similar sounding words can inadvertently be transcribed and may not be corrected upon review.

## 2021-08-01 ENCOUNTER — Ambulatory Visit: Payer: Medicare Other | Admitting: Internal Medicine

## 2021-08-14 ENCOUNTER — Telehealth: Payer: Self-pay

## 2021-08-14 ENCOUNTER — Other Ambulatory Visit: Payer: Self-pay

## 2021-08-14 ENCOUNTER — Ambulatory Visit (INDEPENDENT_AMBULATORY_CARE_PROVIDER_SITE_OTHER): Payer: Medicare Other

## 2021-08-14 ENCOUNTER — Other Ambulatory Visit (INDEPENDENT_AMBULATORY_CARE_PROVIDER_SITE_OTHER): Payer: Medicare Other

## 2021-08-14 VITALS — BP 118/70 | HR 60 | Temp 98.8°F | Ht 68.0 in | Wt 138.8 lb

## 2021-08-14 DIAGNOSIS — R972 Elevated prostate specific antigen [PSA]: Secondary | ICD-10-CM | POA: Diagnosis not present

## 2021-08-14 DIAGNOSIS — E785 Hyperlipidemia, unspecified: Secondary | ICD-10-CM | POA: Diagnosis not present

## 2021-08-14 DIAGNOSIS — I10 Essential (primary) hypertension: Secondary | ICD-10-CM | POA: Diagnosis not present

## 2021-08-14 DIAGNOSIS — Z0000 Encounter for general adult medical examination without abnormal findings: Secondary | ICD-10-CM

## 2021-08-14 DIAGNOSIS — C9110 Chronic lymphocytic leukemia of B-cell type not having achieved remission: Secondary | ICD-10-CM | POA: Diagnosis not present

## 2021-08-14 LAB — URINALYSIS
Bilirubin Urine: NEGATIVE
Hgb urine dipstick: NEGATIVE
Ketones, ur: NEGATIVE
Leukocytes,Ua: NEGATIVE
Nitrite: NEGATIVE
Specific Gravity, Urine: 1.01 (ref 1.000–1.030)
Total Protein, Urine: NEGATIVE
Urine Glucose: NEGATIVE
Urobilinogen, UA: 0.2 (ref 0.0–1.0)
pH: 7.5 (ref 5.0–8.0)

## 2021-08-14 LAB — COMPREHENSIVE METABOLIC PANEL
ALT: 13 U/L (ref 0–53)
AST: 17 U/L (ref 0–37)
Albumin: 4 g/dL (ref 3.5–5.2)
Alkaline Phosphatase: 82 U/L (ref 39–117)
BUN: 14 mg/dL (ref 6–23)
CO2: 32 mEq/L (ref 19–32)
Calcium: 8.9 mg/dL (ref 8.4–10.5)
Chloride: 102 mEq/L (ref 96–112)
Creatinine, Ser: 0.78 mg/dL (ref 0.40–1.50)
GFR: 79.86 mL/min (ref >60.00)
Glucose, Bld: 89 mg/dL (ref 70–99)
Potassium: 4.1 mEq/L (ref 3.5–5.1)
Sodium: 140 mEq/L (ref 135–145)
Total Bilirubin: 1.5 mg/dL — ABNORMAL HIGH (ref 0.2–1.2)
Total Protein: 5.9 g/dL — ABNORMAL LOW (ref 6.0–8.3)

## 2021-08-14 LAB — PSA: PSA: 8.51 ng/mL — ABNORMAL HIGH (ref 0.10–4.00)

## 2021-08-14 LAB — LIPID PANEL
Cholesterol: 119 mg/dL (ref 0–200)
HDL: 53.3 mg/dL (ref >39.00)
LDL Cholesterol: 55 mg/dL (ref 0–99)
NonHDL: 66.1
Total CHOL/HDL Ratio: 2
Triglycerides: 57 mg/dL (ref 0.0–149.0)
VLDL: 11.4 mg/dL (ref 0.0–40.0)

## 2021-08-14 LAB — CBC WITH DIFFERENTIAL/PLATELET
Basophils Absolute: 0.1 10*3/uL (ref 0.0–0.1)
Basophils Relative: 0.2 % (ref 0.0–3.0)
Eosinophils Absolute: 0.1 10*3/uL (ref 0.0–0.7)
Eosinophils Relative: 0.2 % (ref 0.0–5.0)
HCT: 39.4 % (ref 39.0–52.0)
Hemoglobin: 13 g/dL (ref 13.0–17.0)
Lymphocytes Relative: 90.5 % — ABNORMAL HIGH (ref 12.0–46.0)
Lymphs Abs: 29.1 10*3/uL — ABNORMAL HIGH (ref 0.7–4.0)
MCHC: 32.9 g/dL (ref 30.0–36.0)
MCV: 94.3 fl (ref 78.0–100.0)
Monocytes Absolute: 0.4 10*3/uL (ref 0.1–1.0)
Monocytes Relative: 1.3 % — ABNORMAL LOW (ref 3.0–12.0)
Neutro Abs: 2.5 10*3/uL (ref 1.4–7.7)
Neutrophils Relative %: 7.8 % — ABNORMAL LOW (ref 43.0–77.0)
Platelets: 133 10*3/uL — ABNORMAL LOW (ref 150.0–400.0)
RBC: 4.18 Mil/uL — ABNORMAL LOW (ref 4.22–5.81)
RDW: 14 % (ref 11.5–15.5)
WBC: 32.2 10*3/uL (ref 4.0–10.5)

## 2021-08-14 LAB — TSH: TSH: 1.62 u[IU]/mL (ref 0.35–5.50)

## 2021-08-14 NOTE — Telephone Encounter (Signed)
CRITICAL VALUE STICKER  CRITICAL VALUE: WBC 32.2  RECEIVER (on-site recipient of call): Elza Rafter, [place] NOTIFIED: 08/14/21 at 1055  MESSENGER (representative from lab): Charolett Bumpers   MD NOTIFIED: Crawford/[name]  TIME OF NOTIFICATION: [id number]  RESPONSE: No additional orders at this time. Pt routinely runs high.

## 2021-08-14 NOTE — Patient Instructions (Signed)
Jerry Gomez , Thank you for taking time to come for your Medicare Wellness Visit. I appreciate your ongoing commitment to your health goals. Please review the following plan we discussed and let me know if I can assist you in the future.   Screening recommendations/referrals: Colonoscopy: Not a candidate for screening due to age Recommended yearly ophthalmology/optometry visit for glaucoma screening and checkup Recommended yearly dental visit for hygiene and checkup  Vaccinations: Influenza vaccine: 09/12/2020 Pneumococcal vaccine: 10/10/2014, 10/28/2018 Tdap vaccine: 01/15/2010; due every 10 years (overdue) Shingles vaccine: never done   Covid-19: 12/28/2019, 01/18/2020, 07/28/2020  Advanced directives: Please bring a copy of your health care power of attorney and living will to the office at your convenience.  Conditions/risks identified: Yes; Client understands the importance of follow-up with providers by attending scheduled visits and discussed goals to eat healthier, increase physical activity, exercise the brain, socialize more, get enough sleep and make time for laughter.  Next appointment: Please schedule your next Medicare Wellness Visit with your Nurse Health Advisor in 1 year by calling [phone].  Preventive Care 3 Years and Older, Male Preventive care refers to lifestyle choices and visits with your health care provider that can promote health and wellness. What does preventive care include? A yearly physical exam. This is also called an annual well check. Dental exams once or twice a year. Routine eye exams. Ask your health care provider how often you should have your eyes checked. Personal lifestyle choices, including: Daily care of your teeth and gums. Regular physical activity. Eating a healthy diet. Avoiding tobacco and drug use. Limiting alcohol use. Practicing safe sex. Taking low doses of aspirin every day. Taking vitamin and mineral supplements as recommended by  your health care provider. What happens during an annual well check? The services and screenings done by your health care provider during your annual well check will depend on your age, overall health, lifestyle risk factors, and family history of disease. Counseling  Your health care provider may ask you questions about your: Alcohol use. Tobacco use. Drug use. Emotional well-being. Home and relationship well-being. Sexual activity. Eating habits. History of falls. Memory and ability to understand (cognition). Work and work Statistician. Screening  You may have the following tests or measurements: Height, weight, and BMI. Blood pressure. Lipid and cholesterol levels. These may be checked every 5 years, or more frequently if you are over 76 years old. Skin check. Lung cancer screening. You may have this screening every year starting at age 55 if you have a 30-pack-year history of smoking and currently smoke or have quit within the past 15 years. Fecal occult blood test (FOBT) of the stool. You may have this test every year starting at age 76. Flexible sigmoidoscopy or colonoscopy. You may have a sigmoidoscopy every 5 years or a colonoscopy every 10 years starting at age 56. Prostate cancer screening. Recommendations will vary depending on your family history and other risks. Hepatitis C blood test. Hepatitis B blood test. Sexually transmitted disease (STD) testing. Diabetes screening. This is done by checking your blood sugar (glucose) after you have not eaten for a while (fasting). You may have this done every 1-3 years. Abdominal aortic aneurysm (AAA) screening. You may need this if you are a current or former smoker. Osteoporosis. You may be screened starting at age 80 if you are at high risk. Talk with your health care provider about your test results, treatment options, and if necessary, the need for more tests. Vaccines  Your health  care provider may recommend certain vaccines,  such as: Influenza vaccine. This is recommended every year. Tetanus, diphtheria, and acellular pertussis (Tdap, Td) vaccine. You may need a Td booster every 10 years. Zoster vaccine. You may need this after age 76. Pneumococcal 13-valent conjugate (PCV13) vaccine. One dose is recommended after age 35. Pneumococcal polysaccharide (PPSV23) vaccine. One dose is recommended after age 56. Talk to your health care provider about which screenings and vaccines you need and how often you need them. This information is not intended to replace advice given to you by your health care provider. Make sure you discuss any questions you have with your health care provider. Document Released: 12/22/2015 Document Revised: 08/14/2016 Document Reviewed: 09/26/2015 Elsevier Interactive Patient Education  2017 [employer] Prevention in the Home Falls can cause injuries. They can happen to people of all ages. There are many things you can do to make your home safe and to help prevent falls. What can I do on the outside of my home? Regularly fix the edges of walkways and driveways and fix any cracks. Remove anything that might make you trip as you walk through a door, such as a raised step or threshold. Trim any bushes or trees on the path to your home. Use bright outdoor lighting. Clear any walking paths of anything that might make someone trip, such as rocks or tools. Regularly check to see if handrails are loose or broken. Make sure that both sides of any steps have handrails. Any raised decks and porches should have guardrails on the edges. Have any leaves, snow, or ice cleared regularly. Use sand or salt on walking paths during winter. Clean up any spills in your garage right away. This includes oil or grease spills. What can I do in the bathroom? Use night lights. Install grab bars by the toilet and in the tub and shower. Do not use towel bars as grab bars. Use non-skid mats or decals in the tub or  shower. If you need to sit down in the shower, use a plastic, non-slip stool. Keep the floor dry. Clean up any water that spills on the floor as soon as it happens. Remove soap buildup in the tub or shower regularly. Attach bath mats securely with double-sided non-slip rug tape. Do not have throw rugs and other things on the floor that can make you trip. What can I do in the bedroom? Use night lights. Make sure that you have a light by your bed that is easy to reach. Do not use any sheets or blankets that are too big for your bed. They should not hang down onto the floor. Have a firm chair that has side arms. You can use this for support while you get dressed. Do not have throw rugs and other things on the floor that can make you trip. What can I do in the kitchen? Clean up any spills right away. Avoid walking on wet floors. Keep items that you use a lot in easy-to-reach places. If you need to reach something above you, use a strong step stool that has a grab bar. Keep electrical cords out of the way. Do not use floor polish or wax that makes floors slippery. If you must use wax, use non-skid floor wax. Do not have throw rugs and other things on the floor that can make you trip. What can I do with my stairs? Do not leave any items on the stairs. Make sure that there are handrails on  both sides of the stairs and use them. Fix handrails that are broken or loose. Make sure that handrails are as long as the stairways. Check any carpeting to make sure that it is firmly attached to the stairs. Fix any carpet that is loose or worn. Avoid having throw rugs at the top or bottom of the stairs. If you do have throw rugs, attach them to the floor with carpet tape. Make sure that you have a light switch at the top of the stairs and the bottom of the stairs. If you do not have them, ask someone to add them for you. What else can I do to help prevent falls? Wear shoes that: Do not have high heels. Have  rubber bottoms. Are comfortable and fit you well. Are closed at the toe. Do not wear sandals. If you use a stepladder: Make sure that it is fully opened. Do not climb a closed stepladder. Make sure that both sides of the stepladder are locked into place. Ask someone to hold it for you, if possible. Clearly mark and make sure that you can see: Any grab bars or handrails. First and last steps. Where the edge of each step is. Use tools that help you move around (mobility aids) if they are needed. These include: Canes. Walkers. Scooters. Crutches. Turn on the lights when you go into a dark area. Replace any light bulbs as soon as they burn out. Set up your furniture so you have a clear path. Avoid moving your furniture around. If any of your floors are uneven, fix them. If there are any pets around you, be aware of where they are. Review your medicines with your doctor. Some medicines can make you feel dizzy. This can increase your chance of falling. Ask your doctor what other things that you can do to help prevent falls. This information is not intended to replace advice given to you by your health care provider. Make sure you discuss any questions you have with your health care provider. Document Released: 09/21/2009 Document Revised: 05/02/2016 Document Reviewed: 12/30/2014 Elsevier Interactive Patient Education  2017 [employer].

## 2021-08-14 NOTE — Telephone Encounter (Signed)
Consistent with prior no action needed can discuss with PCP at visit 08/22/21

## 2021-08-14 NOTE — Progress Notes (Signed)
Subjective:   Jerry Gomez is a 87 y.o. male who presents for Medicare Annual/Subsequent preventive examination.  Review of Systems     Cardiac Risk Factors include: advanced age (>52mn, >>73women);dyslipidemia;hypertension;male gender     Objective:    Today's Vitals   08/14/21 0927  BP: 118/70  Pulse: 60  Temp: 98.8 F (37.1 C)  SpO2: 96%  Weight: 138 lb 12.8 oz (63 kg)  Height: '5\' 8"'$  (1.727 m)  PainSc: 0-No pain   Body mass index is 21.1 kg/m.  Advanced Directives 08/14/2021 04/09/2021 07/25/2020 05/10/2019 06/24/2018 04/20/2018 04/10/2017  Does Patient Have a Medical Advance Directive? Yes Yes Yes Yes No Yes Yes  Type of Advance Directive Living will;Healthcare Power of Attorney Living will;Healthcare Power of [place]  Does patient want to make changes to medical advance directive? No - Patient declined No - Patient declined - - - - -  Copy of [place] Chart? No - copy requested - - No - copy requested - No - copy requested No - copy requested  Would patient like information on creating a medical advance directive? - - - - - - -    Current Medications (verified) Outpatient Encounter Medications as of 08/14/2021  Medication Sig   Artificial Tear Ointment (ARTIFICIAL TEARS) ointment Place into both eyes nightly.   aspirin 81 MG EC tablet Take 81 mg by mouth daily. Swallow whole.   atorvastatin (LIPITOR) 20 MG tablet TAKE 1 TABLET BY MOUTH EVERY DAY   Cholecalciferol (VITAMIN D) 2000 units tablet Take 2,000 Units by mouth daily.   furosemide (LASIX) 20 MG tablet TAKE 1 TABLET BY MOUTH EVERY DAY   metoprolol succinate (TOPROL-XL) 50 MG 24 hr tablet TAKE 1 TABLET BY MOUTH DAILY. TAKE WITH OR IMMEDIATELY FOLLOWING A MEAL.   Multiple Vitamins-Minerals (CENTRUM SILVER ULTRA MENS PO) Take 1 tablet by  mouth.   No facility-administered encounter medications on file as of 08/14/2021.    Allergies (verified) Patient has no known allergies.   History: Past Medical History:  Diagnosis Date   Cancer ([place]    CLL   Cataract    COLONIC POLYPS 03/02/2009   EYE SURGERY, HX OF 09/05/2007   HEMORRHOIDS, HX OF    HYPERLIPIDEMIA 09/05/2007   HYPERTENSION 07/25/2007   OSTEOARTHRITIS 09/05/2007   SLEEP APNEA, OBSTRUCTIVE 09/05/2007   Past Surgical History:  Procedure Laterality Date   EYE SURGERY  10/22/13   left eye, cataract   Family History  Problem Relation Age of Onset   Kidney disease Father    Cancer Father        prostate-late on-set   Cancer Brother        non-hodgkins lymphoma   Colon cancer Neg Hx    Esophageal cancer Neg Hx    Rectal cancer Neg Hx    Stomach cancer Neg Hx    Social History   Socioeconomic History   Marital status: Married    Spouse name: Not on file   Number of children: 2   Years of education: 13   Highest education level: Not on file  Occupational History   Occupation: retired  Tobacco Use   Smoking status: Former    Types: Cigarettes    Quit date: 02/07/1963    Years since quitting: 58.5   Smokeless tobacco: Never  Vaping Use   Vaping Use:  Never used  Substance and Sexual Activity   Alcohol use: Yes    Alcohol/week: 14.0 standard drinks    Types: 14 Standard drinks or equivalent per week    Comment: wine   Drug use: No   Sexual activity: Yes    Partners: Female  Other Topics Concern   Not on file  Social History Narrative   HSG, [place]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 Married '54. 2 sons ' 63, '67. 2 grandchildren.   Work - retired '07. [employer] - son is second 33. CPR- yes, short-term mechanical ventilation, no prolonged heroic or futile measures.    Social Determinants of Health   Financial Resource Strain: Low Risk    Difficulty of Paying Living Expenses: Not hard at all  Food Insecurity: No Food Insecurity   Worried About  Charity fundraiser in the Last Year: Never true   [place] in the Last Year: Never true  Transportation Needs: No Transportation Needs   Lack of Transportation (Medical): No   Lack of Transportation (Non-Medical): No  Physical Activity: Sufficiently Active   Days of Exercise per Week: 5 days   Minutes of Exercise per Session: 30 min  Stress: No Stress Concern Present   Feeling of Stress : Not at all  Social Connections: Socially Integrated   Frequency of Communication with Friends and Family: More than three times a week   Frequency of Social Gatherings with Friends and Family: More than three times a week   Attends Religious Services: 1 to 4 times per year   Active Member of [employer] or Organizations: Yes   Attends Archivist Meetings: 1 to 4 times per year   Marital Status: Married    Tobacco Counseling Counseling given: Not Answered   Clinical Intake:  Pre-visit preparation completed: Yes  Pain : No/denies pain Pain Score: 0-No pain     BMI - recorded: 21.1 Nutritional Status: BMI of 19-24  Normal Nutritional Risks: None Diabetes: No  How often do you need to have someone help you when you read instructions, pamphlets, or other written materials from your doctor or pharmacy?: 1 - Never What is the last grade level you completed in school?: [employer] [facility])  Diabetic? no  Interpreter Needed?: No  Information entered by :: Lisette Abu, LPN   Activities of Daily Living In your present state of health, do you have any difficulty performing the following activities: 08/14/2021  Hearing? N  Vision? N  Difficulty concentrating or making decisions? N  Walking or climbing stairs? Y  Dressing or bathing? N  Doing errands, shopping? N  Preparing Food and eating ? N  Using the Toilet? N  In the past six months, have you accidently leaked urine? N  Do you have problems with loss of bowel control? N  Managing your Medications? N   Managing your Finances? N  Housekeeping or managing your Housekeeping? N  Some recent data might be hidden    Patient Care Team: [name], [name], MD as PCP - General (Internal Medicine) Griselda Miner, MD (Dermatology) Curt Bears, MD (Hematology and Oncology) Marilynne Halsted, MD as Referring Physician (Ophthalmology)  Indicate any recent Medical Services you may have received from other than [facility] providers in the past year (date may be approximate).     Assessment:   This is a routine wellness examination for Jerry Gomez.  Hearing/Vision screen Hearing Screening - Comments:: Patient denied any hearing difficulty. Vision Screening - Comments:: Patient wears eyeglasses.  Annual eye exam done by Marilynne Halsted, MD.  Dietary issues and exercise activities discussed: Current Exercise Habits: Home exercise routine, Type of exercise: walking, Time (Minutes): 30, Frequency (Times/Week): 5, Weekly Exercise (Minutes/Week): 150, Intensity: Mild, Exercise limited by: orthopedic condition(s)   Goals Addressed   None   Depression Screen [facility] 2/9 Scores 08/14/2021 02/01/2021 05/10/2019 04/20/2018 04/10/2017 12/03/2016 11/30/2016  PHQ - 2 Score 0 0 0 0 0 0 0  PHQ- 9 Score - 0 - 0 2 - -  Exception Documentation - - - - - - -    Fall Risk Fall Risk  08/14/2021 02/01/2021 05/10/2019 04/20/2018 04/10/2017  Falls in the past year? 0 0 0 No No  Number falls in past yr: 0 0 0 - -  Injury with Fall? 0 0 - - -  Risk for fall due to : No Fall Risks No Fall Risks - - -  Follow up Falls evaluation completed - - - -    FALL RISK PREVENTION PERTAINING TO THE HOME:  Any stairs in or around the home? Yes  If so, are there any without handrails? No  Home free of loose throw rugs in walkways, pet beds, electrical cords, etc? Yes  Adequate lighting in your home to reduce risk of falls? Yes   ASSISTIVE DEVICES UTILIZED TO PREVENT FALLS:  Life alert? No  Use of a cane, walker or w/c? Yes  Grab bars in  the bathroom? Yes  Shower chair or bench in shower? No  Elevated toilet seat or a handicapped toilet? No   TIMED UP AND GO:  Was the test performed? Yes .  Length of time to ambulate 10 feet: 11 sec.   Gait slow and steady with assistive device  Cognitive Function: Normal cognitive status assessed by direct observation by this Nurse Health Advisor. No abnormalities found.   MMSE - Mini Mental State Exam 04/20/2018  Orientation to time 5  Orientation to Place 5  Registration 3  Attention/ Calculation 4  Recall 1  Language- name 2 objects 2  Language- repeat 1  Language- follow 3 step command 3  Language- read & follow direction 1  Write a sentence 1  Copy design 1  Total score 27        Immunizations Immunization History  Administered Date(s) Administered   DTP 09/08/1998   Fluad Quad(high Dose 65+) 07/28/2019, 09/12/2020   Influenza Split 08/28/2011, 09/08/2012   Influenza Whole 09/07/2008, 09/02/2009, 09/06/2010   Influenza, High Dose Seasonal PF 08/29/2016, 09/09/2017, 08/24/2018   Influenza,inj,Quad PF,6+ Mos 08/04/2013, 09/08/2014, 09/22/2015   PFIZER(Purple Top)SARS-COV-2 Vaccination 12/28/2019, 01/18/2020, 07/28/2020   Pneumococcal Conjugate-13 10/10/2014   Pneumococcal Polysaccharide-23 09/08/2001, 01/15/2010, 10/28/2018   Td 01/15/2010   Zoster, Live 05/05/2011    TDAP status: Due, Education has been provided regarding the importance of this vaccine. Advised may receive this vaccine at local pharmacy or Health Dept. Aware to provide a copy of the vaccination record if obtained from local pharmacy or Health Dept. Verbalized acceptance and understanding.  Flu Vaccine status: Up to date  Pneumococcal vaccine status: Up to date  Covid-19 vaccine status: Completed vaccines  Qualifies for Shingles Vaccine? Yes   Zostavax completed Yes   Shingrix Completed?: No.    Education has been provided regarding the importance of this vaccine. Patient has been advised to  call insurance company to determine out of pocket expense if they have not yet received this vaccine. Advised may also receive vaccine at local pharmacy or Health Dept.  Verbalized acceptance and understanding.  Screening Tests Health Maintenance  Topic Date Due   Zoster Vaccines- Shingrix (1 of 2) Never done   TETANUS/TDAP  01/16/2020   COVID-19 Vaccine (4 - Booster for Pfizer series) 10/28/2020   INFLUENZA VACCINE  07/09/2021   PNA vac Low Risk Adult  Completed   HPV VACCINES  Aged Out    Health Maintenance  Health Maintenance Due  Topic Date Due   Zoster Vaccines- Shingrix (1 of 2) Never done   TETANUS/TDAP  01/16/2020   COVID-19 Vaccine (4 - Booster for Pfizer series) 10/28/2020   INFLUENZA VACCINE  07/09/2021    Colorectal cancer screening: No longer required.   Lung Cancer Screening: (Low Dose CT Chest recommended if Age 6-80 years, 30 pack-year currently smoking OR have quit w/in 15years.) does not qualify.   Lung Cancer Screening Referral: no  Additional Screening:  Hepatitis C Screening: does not qualify; Completed no  Vision Screening: Recommended annual ophthalmology exams for early detection of glaucoma and other disorders of the eye. Is the patient up to date with their annual eye exam?  Yes  Who is the provider or what is the name of the office in which the patient attends annual eye exams? Marilynne Halsted, MD. If pt is not established with a provider, would they like to be referred to a provider to establish care? No .   Dental Screening: Recommended annual dental exams for proper oral hygiene  Community Resource Referral / Chronic Care Management: CRR required this visit?  No   CCM required this visit?  No      Plan:     I have personally reviewed and noted the following in the patient's chart:   Medical and social history Use of alcohol, tobacco or illicit drugs  Current medications and supplements including opioid prescriptions. Patient is  not currently taking opioid prescriptions. Functional ability and status Nutritional status Physical activity Advanced directives List of other physicians Hospitalizations, surgeries, and ER visits in previous 12 months Vitals Screenings to include cognitive, depression, and falls Referrals and appointments  In addition, I have reviewed and discussed with patient certain preventive protocols, quality metrics, and best practice recommendations. A written personalized care plan for preventive services as well as general preventive health recommendations were provided to patient.     Sheral Flow, LPN   [id number]   Nurse Notes:  Hearing Screening - Comments:: Patient denied any hearing difficulty. Vision Screening - Comments:: Patient wears eyeglasses.  Annual eye exam done by Marilynne Halsted, MD.

## 2021-08-16 ENCOUNTER — Ambulatory Visit: Payer: Medicare Other | Admitting: Internal Medicine

## 2021-08-22 ENCOUNTER — Other Ambulatory Visit: Payer: Self-pay

## 2021-08-22 ENCOUNTER — Ambulatory Visit (INDEPENDENT_AMBULATORY_CARE_PROVIDER_SITE_OTHER): Payer: Medicare Other | Admitting: Internal Medicine

## 2021-08-22 ENCOUNTER — Encounter: Payer: Self-pay | Admitting: Internal Medicine

## 2021-08-22 VITALS — BP 128/60 | Temp 97.8°F | Ht 68.0 in | Wt 139.6 lb

## 2021-08-22 DIAGNOSIS — R29898 Other symptoms and signs involving the musculoskeletal system: Secondary | ICD-10-CM | POA: Diagnosis not present

## 2021-08-22 DIAGNOSIS — E785 Hyperlipidemia, unspecified: Secondary | ICD-10-CM | POA: Diagnosis not present

## 2021-08-22 DIAGNOSIS — M5416 Radiculopathy, lumbar region: Secondary | ICD-10-CM

## 2021-08-22 DIAGNOSIS — Z23 Encounter for immunization: Secondary | ICD-10-CM | POA: Diagnosis not present

## 2021-08-22 NOTE — Assessment & Plan Note (Addendum)
Worse Legs weakness - gradual worsening -likely due to spinal stenosis, possibly aggravated by statin therapy.  Stop Atorvastatin for 4-6 weeks Take CoQ10  Start Ensure plus to improve nutrition Use a Rollator walker

## 2021-08-22 NOTE — Patient Instructions (Signed)
Stop Atorvastatin for 4-6 weeks Take CoQ10  Ensure plus

## 2021-08-22 NOTE — Progress Notes (Signed)
Subjective:  Patient ID: Jerry Gomez, male    DOB: 03-22-1931  Age: 87 y.o. MRN: [id number]  CC: Follow-up (6 month f/u- Flu shot)   HPI Jerry Gomez presents with his wife for LBP, weak legs, gait disorder follow-up.  He has leg weakness is fairly new.  No falls.  Follow-up on dyslipidemia, CLL.   Outpatient Medications Prior to Visit  Medication Sig Dispense Refill   Artificial Tear Ointment (ARTIFICIAL TEARS) ointment Place into both eyes nightly.     aspirin 81 MG EC tablet Take 81 mg by mouth daily. Swallow whole.     atorvastatin (LIPITOR) 20 MG tablet TAKE 1 TABLET BY MOUTH EVERY DAY 90 tablet 1   Cholecalciferol (VITAMIN D) 2000 units tablet Take 2,000 Units by mouth daily.     furosemide (LASIX) 20 MG tablet TAKE 1 TABLET BY MOUTH EVERY DAY 90 tablet 1   metoprolol succinate (TOPROL-XL) 50 MG 24 hr tablet TAKE 1 TABLET BY MOUTH DAILY. TAKE WITH OR IMMEDIATELY FOLLOWING A MEAL. 90 tablet 1   Multiple Vitamins-Minerals (CENTRUM SILVER ULTRA MENS PO) Take 1 tablet by mouth.     No facility-administered medications prior to visit.    ROS: Review of Systems  Constitutional:  Negative for appetite change, fatigue and unexpected weight change.  HENT:  Negative for congestion, nosebleeds, sneezing, sore throat and trouble swallowing.   Eyes:  Negative for itching and visual disturbance.  Respiratory:  Negative for cough.   Cardiovascular:  Positive for leg swelling. Negative for chest pain and palpitations.  Gastrointestinal:  Negative for abdominal distention, blood in stool, diarrhea and nausea.  Genitourinary:  Negative for frequency and hematuria.  Musculoskeletal:  Positive for back pain and gait problem. Negative for joint swelling and neck pain.  Skin:  Negative for rash.  Neurological:  Positive for weakness. Negative for dizziness, tremors and speech difficulty.  Psychiatric/Behavioral:  Negative for agitation, dysphoric mood, sleep disturbance and suicidal ideas. The  patient is not nervous/anxious.    Objective:  BP 128/60 (BP Location: Left Arm)   Temp 97.8 F (36.6 C) (Oral)   Ht '5\' 8"'$  (1.727 m)   Wt 139 lb 9.6 oz (63.3 kg)   BMI 21.23 kg/m   BP Readings from Last 3 Encounters:  08/22/21 128/60  08/14/21 118/70  07/30/21 (!) 128/58    Wt Readings from Last 3 Encounters:  08/22/21 139 lb 9.6 oz (63.3 kg)  08/14/21 138 lb 12.8 oz (63 kg)  07/30/21 140 lb 1.6 oz (63.5 kg)    Physical Exam Constitutional:      General: He is not in acute distress.    Appearance: Normal appearance. He is well-developed.     Comments: NAD  Eyes:     Conjunctiva/sclera: Conjunctivae normal.     Pupils: Pupils are equal, round, and reactive to light.  Neck:     Thyroid: No thyromegaly.     Vascular: No JVD.  Cardiovascular:     Rate and Rhythm: Normal rate and regular rhythm.     Heart sounds: Normal heart sounds. No murmur heard.   No friction rub. No gallop.  Pulmonary:     Effort: Pulmonary effort is normal. No respiratory distress.     Breath sounds: Normal breath sounds. No wheezing or rales.  Chest:     Chest wall: No tenderness.  Abdominal:     General: Bowel sounds are normal. There is no distension.     Palpations: Abdomen is soft. There is  no mass.     Tenderness: There is no abdominal tenderness. There is no guarding or rebound.  Musculoskeletal:        General: Tenderness present. Normal range of motion.     Cervical back: Normal range of motion.  Lymphadenopathy:     Cervical: No cervical adenopathy.  Skin:    General: Skin is warm and dry.     Findings: No rash.  Neurological:     Mental Status: He is alert and oriented to person, place, and time.     Cranial Nerves: No cranial nerve deficit.     Sensory: Sensory deficit present.     Motor: Weakness present. No abnormal muscle tone.     Coordination: Coordination abnormal.     Gait: Gait abnormal.     Deep Tendon Reflexes: Reflexes are normal and symmetric.  Psychiatric:         Mood and Affect: Mood normal.        Behavior: Behavior normal.        Thought Content: Thought content normal.        Judgment: Judgment normal.  Using a cane, unsteady Weak legs --probably 5- out of 5 in hip flexors  Lab Results  Component Value Date   WBC 32.2 Repeated and verified X2. (HH) 08/14/2021   HGB 13.0 08/14/2021   HCT 39.4 08/14/2021   PLT 133.0 (L) 08/14/2021   GLUCOSE 89 08/14/2021   CHOL 119 08/14/2021   TRIG 57.0 08/14/2021   HDL 53.30 08/14/2021   LDLCALC 55 08/14/2021   ALT 13 08/14/2021   AST 17 08/14/2021   NA 140 08/14/2021   K 4.1 08/14/2021   CL 102 08/14/2021   CREATININE 0.78 08/14/2021   BUN 14 08/14/2021   CO2 32 08/14/2021   TSH 1.62 08/14/2021   PSA 8.51 (H) 08/14/2021    MR Lumbar Spine Wo Contrast  Result Date: 03/23/2021 CLINICAL DATA:  Low back pain, progressive neurologic deficit EXAM: MRI LUMBAR SPINE WITHOUT CONTRAST TECHNIQUE: Multiplanar, multisequence MR imaging of the lumbar spine was performed. No intravenous contrast was administered. COMPARISON:  Lumbar radiographs February 21, 2021. FINDINGS: Segmentation: Standard segmentation is seen. The inferior-most fully formed intervertebral disc is labeled L5-S1. Alignment: Slight retrolisthesis of L1 on L2, L2 on L3 and L5 on S1. Vertebrae: Vertebral body heights are maintained. No specific evidence of acute fracture, discitis/osteomyelitis, or suspicious bone lesion. Conus medullaris and cauda equina: Conus extends to the T12 level. Conus and cauda equina appear normal. Paraspinal and other soft tissues: Partially imaged right renal cyst. Disc levels: T12-L1: No significant disc protrusion, foraminal stenosis, or canal stenosis. L1-L2: Slight retrolisthesis of L1 on L2. Mild left subarticular/foraminal disc protrusion and mild left greater than right facet hypertrophy with mild left foraminal stenosis. No significant canal stenosis. L2-L3: Slight retrolisthesis of L2 on L3. Mild left greater  than right facet hypertrophy and left foraminal disc protrusion with mild left foraminal stenosis and mild left subarticular recess stenosis. No significant canal or right foraminal stenosis. L3-L4: Mild bilateral facet hypertrophy and ligamentum flavum thickening without significant canal or foraminal stenosis. L4-L5: Disc bulging, moderate bilateral facet hypertrophy, and ligamentum flavum thickening. Resulting moderate canal stenosis and mild right foraminal stenosis. L5-S1: Central and right foraminal disc protrusions with moderate bilateral facet hypertrophy. No significant canal stenosis. Mild bilateral subarticular recess stenosis. Moderate right foraminal stenosis with far lateral disc contacting the exiting right L5 nerve. No significant left foraminal stenosis. IMPRESSION: 1. Moderate canal stenosis at L4-L5.  2. Moderate right foraminal stenosis at L5-S1. Multilevel mild foraminal stenosis is detailed above. Electronically Signed   By: Margaretha Sheffield MD   On: 03/23/2021 16:37    Assessment & Plan:   Problem List Items Addressed This Visit   None Visit Diagnoses     Needs flu shot    -  Primary   Relevant Orders   Flu Vaccine QUAD High Dose(Fluad) (Completed)         Follow-up: No follow-ups on file.  Walker Kehr, MD

## 2021-08-22 NOTE — Assessment & Plan Note (Addendum)
Leg weakness due to spinal stenosis, possibly aggravated by statin therapy.  Stop Atorvastatin for 4-6 weeks Take CoQ10  Start Ensure plus to improve nutrition Use a Rollator walker

## 2021-08-22 NOTE — Assessment & Plan Note (Addendum)
  Legs weakness - gradual worsening -likely due to spinal stenosis, possibly aggravated by statin therapy.  Stop Atorvastatin for 4-6 weeks Take CoQ10

## 2021-09-20 DIAGNOSIS — Z23 Encounter for immunization: Secondary | ICD-10-CM | POA: Diagnosis not present

## 2021-10-04 ENCOUNTER — Other Ambulatory Visit: Payer: Self-pay | Admitting: Internal Medicine

## 2021-10-17 ENCOUNTER — Telehealth: Payer: Self-pay | Admitting: Internal Medicine

## 2021-10-17 MED ORDER — METOPROLOL SUCCINATE ER 50 MG PO TB24: ORAL_TABLET | ORAL | 1 refills | Status: DC

## 2021-10-17 MED ORDER — ATORVASTATIN CALCIUM 20 MG PO TABS: 20.0000 mg | ORAL_TABLET | Freq: Every day | ORAL | 1 refills | Status: DC

## 2021-10-17 MED ORDER — FUROSEMIDE 20 MG PO TABS: 20.0000 mg | ORAL_TABLET | Freq: Every day | ORAL | 1 refills | Status: DC

## 2021-10-17 NOTE — Telephone Encounter (Signed)
1.Medication Requested: metoprolol succinate (TOPROL-XL) 50 MG 24 hr tablet atorvastatin (LIPITOR) 20 MG tablet furosemide (LASIX) 20 MG tablet   2. Pharmacy (Name, [place]): [id number] - [place], [address] [place]  3. On Med List: yes  4. Last Visit with PCP: 08-22-2021  5. Next visit date with PCP: 10-24-2021   Agent: Please be advised that RX refills may take up to 3 business days. We ask that you follow-up with your pharmacy.

## 2021-10-17 NOTE — Telephone Encounter (Signed)
Refills was sent on 1027/22. Resent to pof.Marland KitchenJohny Chess

## 2021-10-24 ENCOUNTER — Ambulatory Visit (INDEPENDENT_AMBULATORY_CARE_PROVIDER_SITE_OTHER): Payer: Medicare Other | Admitting: Internal Medicine

## 2021-10-24 ENCOUNTER — Other Ambulatory Visit: Payer: Self-pay

## 2021-10-24 ENCOUNTER — Encounter: Payer: Self-pay | Admitting: Internal Medicine

## 2021-10-24 DIAGNOSIS — M5416 Radiculopathy, lumbar region: Secondary | ICD-10-CM | POA: Diagnosis not present

## 2021-10-24 DIAGNOSIS — R29898 Other symptoms and signs involving the musculoskeletal system: Secondary | ICD-10-CM

## 2021-10-24 DIAGNOSIS — E785 Hyperlipidemia, unspecified: Secondary | ICD-10-CM

## 2021-10-24 DIAGNOSIS — C9110 Chronic lymphocytic leukemia of B-cell type not having achieved remission: Secondary | ICD-10-CM

## 2021-10-24 DIAGNOSIS — R972 Elevated prostate specific antigen [PSA]: Secondary | ICD-10-CM | POA: Diagnosis not present

## 2021-10-24 DIAGNOSIS — I10 Essential (primary) hypertension: Secondary | ICD-10-CM

## 2021-10-24 NOTE — Assessment & Plan Note (Addendum)
Get a Rollator walker Drink Ensure

## 2021-10-24 NOTE — Assessment & Plan Note (Signed)
PSA q 6 months

## 2021-10-24 NOTE — Assessment & Plan Note (Signed)
Legs weakness - gradual worsening - likely due to spinal stenosis, possibly aggravated by statin therapy - can tolerate Lipitor once a week.

## 2021-10-24 NOTE — Assessment & Plan Note (Signed)
Monitoring CBC 

## 2021-10-24 NOTE — Progress Notes (Signed)
Subjective:  Patient ID: Jerry Gomez, male    DOB: 06/30/1931  Age: 87 y.o. MRN: [id number]  CC: Follow-up (2 month f/u)   HPI Jerry Gomez presents for weak legs, ankle swelling, gait disorder  Outpatient Medications Prior to Visit  Medication Sig Dispense Refill   Artificial Tear Ointment (ARTIFICIAL TEARS) ointment Place into both eyes nightly.     aspirin 81 MG EC tablet Take 81 mg by mouth daily. Swallow whole.     atorvastatin (LIPITOR) 20 MG tablet Take 1 tablet (20 mg total) by mouth daily. 90 tablet 1   Cholecalciferol (VITAMIN D) 2000 units tablet Take 2,000 Units by mouth daily.     furosemide (LASIX) 20 MG tablet Take 1 tablet (20 mg total) by mouth daily. 90 tablet 1   metoprolol succinate (TOPROL-XL) 50 MG 24 hr tablet TAKE 1 TABLET BY MOUTH DAILY WITH OR IMMEDIATELY FOLLOWING A MEAL. 90 tablet 1   Multiple Vitamins-Minerals (CENTRUM SILVER ULTRA MENS PO) Take 1 tablet by mouth.     No facility-administered medications prior to visit.    ROS: Review of Systems  Constitutional:  Positive for fatigue. Negative for appetite change and unexpected weight change.  HENT:  Negative for congestion, nosebleeds, sneezing, sore throat and trouble swallowing.   Eyes:  Negative for itching and visual disturbance.  Respiratory:  Negative for cough.   Cardiovascular:  Negative for chest pain, palpitations and leg swelling.  Gastrointestinal:  Negative for abdominal distention, blood in stool, diarrhea and nausea.  Genitourinary:  Negative for frequency and hematuria.  Musculoskeletal:  Positive for gait problem. Negative for back pain, joint swelling and neck pain.  Skin:  Negative for rash.  Neurological:  Positive for weakness. Negative for dizziness, tremors and speech difficulty.  Psychiatric/Behavioral:  Negative for agitation, dysphoric mood and sleep disturbance. The patient is not nervous/anxious.    Objective:  BP (!) 122/52 (BP Location: Left Arm)   Pulse 81   Temp  97.8 F (36.6 C) (Oral)   Ht 5\' 8"  (1.727 m)   Wt 138 lb (62.6 kg)   SpO2 96%   BMI 20.98 kg/m   BP Readings from Last 3 Encounters:  10/24/21 (!) 122/52  08/22/21 128/60  08/14/21 118/70    Wt Readings from Last 3 Encounters:  10/24/21 138 lb (62.6 kg)  08/22/21 139 lb 9.6 oz (63.3 kg)  08/14/21 138 lb 12.8 oz (63 kg)    Physical Exam Constitutional:      General: He is not in acute distress.    Appearance: He is well-developed.     Comments: NAD  Eyes:     Conjunctiva/sclera: Conjunctivae normal.     Pupils: Pupils are equal, round, and reactive to light.  Neck:     Thyroid: No thyromegaly.     Vascular: No JVD.  Cardiovascular:     Rate and Rhythm: Normal rate and regular rhythm.     Heart sounds: Normal heart sounds. No murmur heard.   No friction rub. No gallop.  Pulmonary:     Effort: Pulmonary effort is normal. No respiratory distress.     Breath sounds: Normal breath sounds. No wheezing or rales.  Chest:     Chest wall: No tenderness.  Abdominal:     General: Bowel sounds are normal. There is no distension.     Palpations: Abdomen is soft. There is no mass.     Tenderness: There is no abdominal tenderness. There is no guarding or rebound.  Musculoskeletal:        General: No tenderness. Normal range of motion.     Cervical back: Normal range of motion.     Right lower leg: Edema present.     Left lower leg: Edema present.  Lymphadenopathy:     Cervical: No cervical adenopathy.  Skin:    General: Skin is warm and dry.     Findings: No rash.  Neurological:     Mental Status: He is alert and oriented to person, place, and time.     Cranial Nerves: No cranial nerve deficit.     Motor: Weakness present. No abnormal muscle tone.     Coordination: Coordination normal.     Gait: Gait abnormal.     Deep Tendon Reflexes: Reflexes are normal and symmetric.  Psychiatric:        Behavior: Behavior normal.        Thought Content: Thought content normal.         Judgment: Judgment normal.  Weak legs Using a cane, ataxic Trace edema B ankles  Lab Results  Component Value Date   WBC 32.2 Repeated and verified X2. (HH) 08/14/2021   HGB 13.0 08/14/2021   HCT 39.4 08/14/2021   PLT 133.0 (L) 08/14/2021   GLUCOSE 89 08/14/2021   CHOL 119 08/14/2021   TRIG 57.0 08/14/2021   HDL 53.30 08/14/2021   LDLCALC 55 08/14/2021   ALT 13 08/14/2021   AST 17 08/14/2021   NA 140 08/14/2021   K 4.1 08/14/2021   CL 102 08/14/2021   CREATININE 0.78 08/14/2021   BUN 14 08/14/2021   CO2 32 08/14/2021   TSH 1.62 08/14/2021   PSA 8.51 (H) 08/14/2021    MR Lumbar Spine Wo Contrast  Result Date: 03/23/2021 CLINICAL DATA:  Low back pain, progressive neurologic deficit EXAM: MRI LUMBAR SPINE WITHOUT CONTRAST TECHNIQUE: Multiplanar, multisequence MR imaging of the lumbar spine was performed. No intravenous contrast was administered. COMPARISON:  Lumbar radiographs February 21, 2021. FINDINGS: Segmentation: Standard segmentation is seen. The inferior-most fully formed intervertebral disc is labeled L5-S1. Alignment: Slight retrolisthesis of L1 on L2, L2 on L3 and L5 on S1. Vertebrae: Vertebral body heights are maintained. No specific evidence of acute fracture, discitis/osteomyelitis, or suspicious bone lesion. Conus medullaris and cauda equina: Conus extends to the T12 level. Conus and cauda equina appear normal. Paraspinal and other soft tissues: Partially imaged right renal cyst. Disc levels: T12-L1: No significant disc protrusion, foraminal stenosis, or canal stenosis. L1-L2: Slight retrolisthesis of L1 on L2. Mild left subarticular/foraminal disc protrusion and mild left greater than right facet hypertrophy with mild left foraminal stenosis. No significant canal stenosis. L2-L3: Slight retrolisthesis of L2 on L3. Mild left greater than right facet hypertrophy and left foraminal disc protrusion with mild left foraminal stenosis and mild left subarticular recess stenosis. No  significant canal or right foraminal stenosis. L3-L4: Mild bilateral facet hypertrophy and ligamentum flavum thickening without significant canal or foraminal stenosis. L4-L5: Disc bulging, moderate bilateral facet hypertrophy, and ligamentum flavum thickening. Resulting moderate canal stenosis and mild right foraminal stenosis. L5-S1: Central and right foraminal disc protrusions with moderate bilateral facet hypertrophy. No significant canal stenosis. Mild bilateral subarticular recess stenosis. Moderate right foraminal stenosis with far lateral disc contacting the exiting right L5 nerve. No significant left foraminal stenosis. IMPRESSION: 1. Moderate canal stenosis at L4-L5. 2. Moderate right foraminal stenosis at L5-S1. Multilevel mild foraminal stenosis is detailed above. Electronically Signed   By: Margaretha Sheffield MD  On: 03/23/2021 16:37    Assessment & Plan:   Problem List Items Addressed This Visit     CLL (chronic lymphocytic leukemia) ([place])    Monitoring CBC      Dyslipidemia     Legs weakness - gradual worsening - likely due to spinal stenosis, possibly aggravated by statin therapy - can tolerate Lipitor once a week.        Elevated PSA    PSA q 6 months      Essential hypertension    Continue w/Metoprolol, furosemide      Leg weakness, bilateral    Get a Rollator walker Drink Ensure      Lumbar radiculopathy    Get a Rollator walker         No orders of the defined types were placed in this encounter.     Follow-up: Return in about 4 months (around 02/21/2022) for a follow-up visit.  Walker Kehr, MD

## 2021-10-24 NOTE — Assessment & Plan Note (Signed)
Get a Rollator walker

## 2021-10-24 NOTE — Assessment & Plan Note (Signed)
Continue w/Metoprolol, furosemide

## 2021-10-24 NOTE — Patient Instructions (Signed)
[facility] [address], [place], [place] [address]  [phone]  Office Hours: Monday - Friday: 8:00 AM - 5:00 PM Saturday - Sunday: Closed

## 2021-11-20 DIAGNOSIS — D1801 Hemangioma of skin and subcutaneous tissue: Secondary | ICD-10-CM | POA: Diagnosis not present

## 2021-11-20 DIAGNOSIS — L570 Actinic keratosis: Secondary | ICD-10-CM | POA: Diagnosis not present

## 2021-11-20 DIAGNOSIS — L720 Epidermal cyst: Secondary | ICD-10-CM | POA: Diagnosis not present

## 2021-11-20 DIAGNOSIS — D2261 Melanocytic nevi of right upper limb, including shoulder: Secondary | ICD-10-CM | POA: Diagnosis not present

## 2021-11-20 DIAGNOSIS — D225 Melanocytic nevi of trunk: Secondary | ICD-10-CM | POA: Diagnosis not present

## 2021-11-20 DIAGNOSIS — L853 Xerosis cutis: Secondary | ICD-10-CM | POA: Diagnosis not present

## 2021-11-20 DIAGNOSIS — Z85828 Personal history of other malignant neoplasm of skin: Secondary | ICD-10-CM | POA: Diagnosis not present

## 2021-11-20 DIAGNOSIS — L821 Other seborrheic keratosis: Secondary | ICD-10-CM | POA: Diagnosis not present

## 2021-11-20 DIAGNOSIS — D692 Other nonthrombocytopenic purpura: Secondary | ICD-10-CM | POA: Diagnosis not present

## 2021-12-17 DIAGNOSIS — H52203 Unspecified astigmatism, bilateral: Secondary | ICD-10-CM | POA: Diagnosis not present

## 2021-12-17 DIAGNOSIS — H353132 Nonexudative age-related macular degeneration, bilateral, intermediate dry stage: Secondary | ICD-10-CM | POA: Diagnosis not present

## 2021-12-17 DIAGNOSIS — H18421 Band keratopathy, right eye: Secondary | ICD-10-CM | POA: Diagnosis not present

## 2021-12-17 DIAGNOSIS — H0100A Unspecified blepharitis right eye, upper and lower eyelids: Secondary | ICD-10-CM | POA: Diagnosis not present

## 2022-01-01 NOTE — Progress Notes (Deleted)
Subjective:    Patient ID: Jerry Gomez, male    DOB: [birth year], 87 y.o.   MRN: [id number]  This visit occurred during the SARS-CoV-2 public health emergency.  Safety protocols were in place, including screening questions prior to the visit, additional usage of staff PPE, and extensive cleaning of exam room while observing appropriate contact time as indicated for disinfecting solutions.    HPI The patient is here for an acute visit.  Left foot and leg swelling -    Medications and allergies reviewed with patient and updated if appropriate.  Patient Active Problem List   Diagnosis Date Noted   Leg weakness, bilateral 08/22/2021   Spinal stenosis 03/27/2021   Lumbar radiculopathy 03/12/2021   Acute left-sided low back pain with left-sided sciatica 02/21/2021   Insomnia 10/28/2018   Bradycardia 10/09/2016   Sebaceous cyst 10/09/2016   Carotid bruit 04/08/2016   Acute sinusitis 11/01/2015   Elevated PSA 04/10/2015   Well adult exam 10/10/2014   Bruising 10/10/2014   CLL (chronic lymphocytic leukemia) ([place]) 06/09/2012   Pain of right lower leg 02/07/2012   Dyslipidemia 09/05/2007   SLEEP APNEA, OBSTRUCTIVE 09/05/2007   OSTEOARTHRITIS 09/05/2007   Essential hypertension 07/25/2007    Current Outpatient Medications on File Prior to Visit  Medication Sig Dispense Refill   Artificial Tear Ointment (ARTIFICIAL TEARS) ointment Place into both eyes nightly.     aspirin 81 MG EC tablet Take 81 mg by mouth daily. Swallow whole.     atorvastatin (LIPITOR) 20 MG tablet Take 1 tablet (20 mg total) by mouth daily. 90 tablet 1   Cholecalciferol (VITAMIN D) 2000 units tablet Take 2,000 Units by mouth daily.     furosemide (LASIX) 20 MG tablet Take 1 tablet (20 mg total) by mouth daily. 90 tablet 1   metoprolol succinate (TOPROL-XL) 50 MG 24 hr tablet TAKE 1 TABLET BY MOUTH DAILY WITH OR IMMEDIATELY FOLLOWING A MEAL. 90 tablet 1   Multiple Vitamins-Minerals (CENTRUM SILVER ULTRA MENS  PO) Take 1 tablet by mouth.     No current facility-administered medications on file prior to visit.    Past Medical History:  Diagnosis Date   Cancer ([place])    CLL   Cataract    COLONIC POLYPS 03/02/2009   EYE SURGERY, HX OF 09/05/2007   HEMORRHOIDS, HX OF    HYPERLIPIDEMIA 09/05/2007   HYPERTENSION 07/25/2007   OSTEOARTHRITIS 09/05/2007   SLEEP APNEA, OBSTRUCTIVE 09/05/2007    Past Surgical History:  Procedure Laterality Date   EYE SURGERY  10/22/13   left eye, cataract    Social History   Socioeconomic History   Marital status: Married    Spouse name: Not on file   Number of children: 2   Years of education: 13   Highest education level: Not on file  Occupational History   Occupation: retired  Tobacco Use   Smoking status: Former    Types: Cigarettes    Quit date: 02/07/1963    Years since quitting: 58.9   Smokeless tobacco: Never  Vaping Use   Vaping Use: Never used  Substance and Sexual Activity   Alcohol use: Yes    Alcohol/week: 14.0 standard drinks    Types: 14 Standard drinks or equivalent per week    Comment: wine   Drug use: No   Sexual activity: Yes    Partners: Female  Other Topics Concern   Not on file  Social History Narrative   [employer], [place]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 Married '54. 2 sons ' 63, '67. 2 grandchildren.   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 CPR- yes, short-term mechanical ventilation, no prolonged heroic or futile measures.    Social Determinants of Health   Financial Resource Strain: Low Risk    Difficulty of Paying Living Expenses: Not hard at all  Food Insecurity: No Food Insecurity   Worried About Charity fundraiser in the Last Year: Never true   [place] in the Last Year: Never true  Transportation Needs: No Transportation Needs   Lack of Transportation (Medical): No   Lack of Transportation (Non-Medical): No  Physical Activity: Sufficiently Active   Days of Exercise per Week: 5 days   Minutes  of Exercise per Session: 30 min  Stress: No Stress Concern Present   Feeling of Stress : Not at all  Social Connections: Socially Integrated   Frequency of Communication with Friends and Family: More than three times a week   Frequency of Social Gatherings with Friends and Family: More than three times a week   Attends Religious Services: 1 to 4 times per year   Active Member of [employer] or Organizations: Yes   Attends Archivist Meetings: 1 to 4 times per year   Marital Status: Married    Family History  Problem Relation Age of Onset   Kidney disease Father    Cancer Father        prostate-late on-set   Cancer Brother        non-hodgkins lymphoma   Colon cancer Neg Hx    Esophageal cancer Neg Hx    Rectal cancer Neg Hx    Stomach cancer Neg Hx     Review of Systems     Objective:  There were no vitals filed for this visit. BP Readings from Last 3 Encounters:  10/24/21 (!) 122/52  08/22/21 128/60  08/14/21 118/70   Wt Readings from Last 3 Encounters:  10/24/21 138 lb (62.6 kg)  08/22/21 139 lb 9.6 oz (63.3 kg)  08/14/21 138 lb 12.8 oz (63 kg)   There is no height or weight on file to calculate BMI.   Physical Exam         Assessment & Plan:    See Problem List for Assessment and Plan of chronic medical problems.

## 2022-01-02 ENCOUNTER — Emergency Department (HOSPITAL_COMMUNITY): Payer: Medicare Other

## 2022-01-02 ENCOUNTER — Encounter (HOSPITAL_COMMUNITY): Payer: Self-pay

## 2022-01-02 ENCOUNTER — Ambulatory Visit: Payer: Medicare Other | Admitting: Internal Medicine

## 2022-01-02 ENCOUNTER — Emergency Department (HOSPITAL_BASED_OUTPATIENT_CLINIC_OR_DEPARTMENT_OTHER)
Admit: 2022-01-02 | Discharge: 2022-01-02 | Disposition: A | Discharge status: Home / Self Care | Payer: Medicare Other | Attending: Emergency Medicine | Admitting: Emergency Medicine

## 2022-01-02 ENCOUNTER — Other Ambulatory Visit: Payer: Self-pay

## 2022-01-02 ENCOUNTER — Emergency Department (HOSPITAL_COMMUNITY)
Admission: EM | Admit: 2022-01-02 | Discharge: 2022-01-02 | Disposition: A | Discharge status: Home / Self Care | Payer: Medicare Other | Attending: Emergency Medicine | Admitting: Emergency Medicine

## 2022-01-02 DIAGNOSIS — R531 Weakness: Secondary | ICD-10-CM | POA: Diagnosis not present

## 2022-01-02 DIAGNOSIS — I10 Essential (primary) hypertension: Secondary | ICD-10-CM | POA: Diagnosis not present

## 2022-01-02 DIAGNOSIS — M4316 Spondylolisthesis, lumbar region: Secondary | ICD-10-CM | POA: Diagnosis not present

## 2022-01-02 DIAGNOSIS — Z20822 Contact with and (suspected) exposure to covid-19: Secondary | ICD-10-CM | POA: Insufficient documentation

## 2022-01-02 DIAGNOSIS — Z79899 Other long term (current) drug therapy: Secondary | ICD-10-CM | POA: Diagnosis not present

## 2022-01-02 DIAGNOSIS — R600 Localized edema: Secondary | ICD-10-CM | POA: Diagnosis not present

## 2022-01-02 DIAGNOSIS — M7989 Other specified soft tissue disorders: Secondary | ICD-10-CM | POA: Diagnosis not present

## 2022-01-02 DIAGNOSIS — Z743 Need for continuous supervision: Secondary | ICD-10-CM | POA: Diagnosis not present

## 2022-01-02 DIAGNOSIS — R29898 Other symptoms and signs involving the musculoskeletal system: Secondary | ICD-10-CM | POA: Diagnosis not present

## 2022-01-02 DIAGNOSIS — I959 Hypotension, unspecified: Secondary | ICD-10-CM | POA: Diagnosis not present

## 2022-01-02 DIAGNOSIS — G319 Degenerative disease of nervous system, unspecified: Secondary | ICD-10-CM | POA: Diagnosis not present

## 2022-01-02 DIAGNOSIS — Z7982 Long term (current) use of aspirin: Secondary | ICD-10-CM | POA: Diagnosis not present

## 2022-01-02 DIAGNOSIS — R000 Tachycardia, unspecified: Secondary | ICD-10-CM | POA: Diagnosis not present

## 2022-01-02 DIAGNOSIS — R2689 Other abnormalities of gait and mobility: Secondary | ICD-10-CM | POA: Diagnosis not present

## 2022-01-02 LAB — URINALYSIS, ROUTINE W REFLEX MICROSCOPIC
Bacteria, UA: NONE SEEN
Bilirubin Urine: NEGATIVE
Glucose, UA: NEGATIVE mg/dL
Hgb urine dipstick: NEGATIVE
Ketones, ur: 5 mg/dL — AB
Leukocytes,Ua: NEGATIVE
Nitrite: NEGATIVE
Protein, ur: 30 mg/dL — AB
Specific Gravity, Urine: 1.013 (ref 1.005–1.030)
pH: 7 (ref 5.0–8.0)

## 2022-01-02 LAB — COMPREHENSIVE METABOLIC PANEL
ALT: 14 U/L (ref 0–44)
AST: 23 U/L (ref 15–41)
Albumin: 3.6 g/dL (ref 3.5–5.0)
Alkaline Phosphatase: 82 U/L (ref 38–126)
Anion gap: 8 (ref 5–15)
BUN: 18 mg/dL (ref 8–23)
CO2: 28 mmol/L (ref 22–32)
Calcium: 8.8 mg/dL — ABNORMAL LOW (ref 8.9–10.3)
Chloride: 101 mmol/L (ref 98–111)
Creatinine, Ser: 0.59 mg/dL — ABNORMAL LOW (ref 0.61–1.24)
GFR, Estimated: >60 mL/min (ref >60)
Glucose, Bld: 92 mg/dL (ref 70–99)
Potassium: 4.4 mmol/L (ref 3.5–5.1)
Sodium: 137 mmol/L (ref 135–145)
Total Bilirubin: 1.2 mg/dL (ref 0.3–1.2)
Total Protein: 5.8 g/dL — ABNORMAL LOW (ref 6.5–8.1)

## 2022-01-02 LAB — CBC WITH DIFFERENTIAL/PLATELET
Abs Immature Granulocytes: 0.04 10*3/uL (ref 0.00–0.07)
Basophils Absolute: 0.1 10*3/uL (ref 0.0–0.1)
Basophils Relative: 0 %
Eosinophils Absolute: 0 10*3/uL (ref 0.0–0.5)
Eosinophils Relative: 0 %
HCT: 38.3 % — ABNORMAL LOW (ref 39.0–52.0)
Hemoglobin: 12.2 g/dL — ABNORMAL LOW (ref 13.0–17.0)
Immature Granulocytes: 0 %
Lymphocytes Relative: 83 %
Lymphs Abs: 27.2 10*3/uL — ABNORMAL HIGH (ref 0.7–4.0)
MCH: 31.1 pg (ref 26.0–34.0)
MCHC: 31.9 g/dL (ref 30.0–36.0)
MCV: 97.7 fL (ref 80.0–100.0)
Monocytes Absolute: 0.4 10*3/uL (ref 0.1–1.0)
Monocytes Relative: 1 %
Neutro Abs: 5.1 10*3/uL (ref 1.7–7.7)
Neutrophils Relative %: 16 %
Platelets: 176 10*3/uL (ref 150–400)
RBC: 3.92 MIL/uL — ABNORMAL LOW (ref 4.22–5.81)
RDW: 13.2 % (ref 11.5–15.5)
WBC: 32.8 10*3/uL — ABNORMAL HIGH (ref 4.0–10.5)
nRBC: 0 % (ref 0.0–0.2)

## 2022-01-02 LAB — MAGNESIUM: Magnesium: 2.3 mg/dL (ref 1.7–2.4)

## 2022-01-02 LAB — RESP PANEL BY RT-PCR (FLU A&B, COVID) ARPGX2
Influenza A by PCR: NEGATIVE
Influenza B by PCR: NEGATIVE
SARS Coronavirus 2 by RT PCR: NEGATIVE

## 2022-01-02 LAB — LACTATE DEHYDROGENASE: LDH: 191 U/L (ref 98–192)

## 2022-01-02 LAB — BRAIN NATRIURETIC PEPTIDE: B Natriuretic Peptide: 91.3 pg/mL (ref 0.0–100.0)

## 2022-01-02 MED ORDER — GADOBUTROL 1 MMOL/ML IV SOLN
6.0000 mL | Freq: Once | INTRAVENOUS | Status: AC | PRN
  Administered 2022-01-02: 15:00:00 6 mL via INTRAVENOUS

## 2022-01-02 NOTE — ED Notes (Signed)
Pt in mri 

## 2022-01-02 NOTE — ED Provider Notes (Signed)
Signout note  88 year old male presented to ER with concern for weakness.  Has had issues with weakness previously but seem to be worse today, worse in his right leg.  After getting off the commode this morning he felt completely unable to stand.  On physical exam initial concern for right-sided lower extremity weakness.  MRI L-spine obtained to further assess.  Plan to follow-up MRI and discussed with neurosurgery.  MRI L-spine shows arthropathy at L4/L5 resulting in moderate spinal canal stenosis, possible impingement of right L4 nerve root, unchanged from prior MRI in April 2022.  Discussed findings with Dr. Ronnald Ramp on-call for neurosurgery.  He states that he has reviewed the imaging and does not feel these findings would explain the symptoms or exam today.  Reassessed patient, query slight weakness in the right leg.  Will check MRI brain to rule out stroke.  DVT study was negative as well.  We will also check basic labs.  MRI brain negative for acute stroke.  Reviewed basic laboratory work, CBC, BMP grossly stable.  Patient subsequently was able to walk with walker without significant assistance.  Patient states that he currently feels well and does not have any acute complaints at present.  Feel he can be discharged and managed in the outpatient setting.  Regarding his mild lower lower leg swelling, recommend he take the furosemide that was previously prescribed by his primary doctor.  Recommend he also follow-up with primary doctor and physical therapy.  Reviewed return precautions, discharged home.   Lucrezia Starch, MD 01/02/22 1958

## 2022-01-02 NOTE — Discharge Instructions (Addendum)
Recommend taking the previously prescribed Lasix for your leg swelling.  Recommend following up with physical therapy.  Come back to ER if you develop worsening weakness, falls, or other new concerning symptom.

## 2022-01-02 NOTE — ED Notes (Signed)
Pt back from MRI, pt denies pain, sig other at bedside

## 2022-01-02 NOTE — ED Notes (Signed)
Pt in bed, monitor replaced, pt denies pain at this time, sig other at bedside.

## 2022-01-02 NOTE — ED Triage Notes (Signed)
Pt to er room number 8 via ems, per ems pt had a sudden onset of bilateral leg weakness.  Pt has no complaints at this time, pt denies pain, pt awake and oriented times three.

## 2022-01-02 NOTE — Progress Notes (Signed)
Right lower extremity venous duplex has been completed. Preliminary results can be found in CV Proc through chart review.  Results were given to Dr. Doren Custard.  01/02/22 1:48 PM Jerry Gomez RVT

## 2022-01-02 NOTE — ED Notes (Signed)
Pt ambulatory to bathroom with walker, pt has slow gait,

## 2022-01-02 NOTE — ED Provider Notes (Signed)
[facility] DEPT Provider Note   CSN: [id number] Arrival date & time: 01/02/22  1223     History  Chief Complaint  Patient presents with   Weakness    Jerry Gomez is a 88 y.o. male.   Weakness Associated symptoms: no abdominal pain, no arthralgias, no chest pain, no cough, no diarrhea, no dizziness, no dysuria, no fever, no headaches, no myalgias, no nausea, no shortness of breath and no vomiting   Patient presents for right leg weakness.  He has a history of lumbar radiculopathy, spinal stenosis, and bilateral leg weakness.  Additional medical history includes elevated PSA, CLL, HTN, HLD.  His CLL has been indolent for the past 20 years.  He does follow with Dr. Julien Nordmann for this.  He reports that he has a 16 step staircase at home that he is able to traverse up and down several times per day.  This morning, he was able to walk down the stairs and back up.  At around noon, he went to use the bathroom.  He was sitting on the commode.  When he went to stand up, he had total weakness of his right leg.  This caused him to fall to the floor.  He did get the attention of his wife, who was unable to help him back to his feet.  EMS was called and were able to get him onto a stretcher.  Patient has had persistent right leg weakness since this time.  He denies any areas of pain.  He has had ongoing swelling to his BLE that is worse on the right.    Home Medications Prior to Admission medications   Medication Sig Start Date End Date Taking? Authorizing Provider  Artificial Tear Ointment (ARTIFICIAL TEARS) ointment Place into both eyes nightly.    [provider]  aspirin 81 MG EC tablet Take 81 mg by mouth daily. Swallow whole.    [provider]  atorvastatin (LIPITOR) 20 MG tablet Take 1 tablet (20 mg total) by mouth daily. 10/17/21   [name], [name], MD  Cholecalciferol (VITAMIN D) 2000 units tablet Take 2,000 Units by mouth daily.     [provider]  furosemide (LASIX) 20 MG tablet Take 1 tablet (20 mg total) by mouth daily. 10/17/21   [name], [name], MD  metoprolol succinate (TOPROL-XL) 50 MG 24 hr tablet TAKE 1 TABLET BY MOUTH DAILY WITH OR IMMEDIATELY FOLLOWING A MEAL. 10/17/21   [name], [name], MD  Multiple Vitamins-Minerals (CENTRUM SILVER ULTRA MENS PO) Take 1 tablet by mouth.    [provider]      Allergies    Patient has no known allergies.    Review of Systems   Review of Systems  Constitutional:  Negative for activity change, appetite change, chills, fatigue and fever.  HENT:  Negative for congestion and trouble swallowing.   Eyes:  Negative for visual disturbance.  Respiratory:  Negative for cough, chest tightness, shortness of breath and wheezing.   Cardiovascular:  Positive for leg swelling. Negative for chest pain and palpitations.  Gastrointestinal:  Negative for abdominal pain, diarrhea, nausea and vomiting.  Genitourinary:  Negative for difficulty urinating, dysuria and hematuria.  Musculoskeletal:  Positive for gait problem. Negative for arthralgias, back pain, joint swelling, myalgias and neck pain.  Skin:  Negative for rash and wound.  Neurological:  Positive for weakness. Negative for dizziness, tremors, syncope, facial asymmetry, speech difficulty, light-headedness, numbness and headaches.  Hematological:  Does not bruise/bleed  easily.  Psychiatric/Behavioral:  Negative for confusion and decreased concentration.    Physical Exam Updated Vital Signs BP 135/74    Pulse 74    Temp (!) 97.5 F (36.4 C) (Oral)    Resp 12    Ht 5\' 6"  (1.676 m)    Wt 61.2 kg    SpO2 99%    BMI 21.79 kg/m  Physical Exam Vitals and nursing note reviewed.  Constitutional:      General: He is not in acute distress.    Appearance: Normal appearance. He is well-developed and normal weight. He is not ill-appearing, toxic-appearing or diaphoretic.  HENT:     Head: Normocephalic and  atraumatic.     Right Ear: External ear normal.     Left Ear: External ear normal.     Nose: Nose normal.     Mouth/Throat:     Mouth: Mucous membranes are moist.     Pharynx: Oropharynx is clear.  Eyes:     General: No scleral icterus.    Extraocular Movements: Extraocular movements intact.     Conjunctiva/sclera: Conjunctivae normal.  Cardiovascular:     Rate and Rhythm: Normal rate and regular rhythm.     Heart sounds: No murmur heard. Pulmonary:     Effort: Pulmonary effort is normal. No respiratory distress.     Breath sounds: Normal breath sounds. No wheezing or rales.  Chest:     Chest wall: No tenderness.  Abdominal:     General: Abdomen is flat.     Palpations: Abdomen is soft.     Tenderness: There is no abdominal tenderness.  Musculoskeletal:        General: No swelling or tenderness.     Cervical back: Normal range of motion and neck supple. No rigidity.     Right lower leg: Edema present.     Left lower leg: Edema present.     Comments: BLE pitting edema up to the knees that is greater on the right.  Patient reports that this has been ongoing for several months  Skin:    General: Skin is warm and dry.     Capillary Refill: Capillary refill takes less than 2 seconds.     Coloration: Skin is not jaundiced or pale.  Neurological:     Mental Status: He is alert and oriented to person, place, and time.     Cranial Nerves: Cranial nerves 2-12 are intact. No cranial nerve deficit, dysarthria or facial asymmetry.     Sensory: Sensation is intact. No sensory deficit.     Motor: Weakness (4/5 strength in bilateral knee extension, 4/5 strength in right hip flexion, 5/5 strength in left hip flexion, 5/5 strength in bilateral ankle plantar and dorsiflexion; 5/5 strength in bilateral upper extremities) present. No tremor, abnormal muscle tone or pronator drift.     Coordination: Coordination is intact.  Psychiatric:        Mood and Affect: Mood normal.    ED Results /  Procedures / Treatments   Labs (all labs ordered are listed, but only abnormal results are displayed) Labs Reviewed  URINALYSIS, ROUTINE W REFLEX MICROSCOPIC - Abnormal; Notable for the following components:      Result Value   Ketones, ur 5 (*)    Protein, ur 30 (*)    All other components within normal limits  RESP PANEL BY RT-PCR (FLU A&B, COVID) ARPGX2  CBC WITH DIFFERENTIAL/PLATELET  COMPREHENSIVE METABOLIC PANEL  MAGNESIUM  LACTATE DEHYDROGENASE    EKG EKG Interpretation  Date/Time:  Wednesday January 02 2022 12:37:40 EST Ventricular Rate:  76 PR Interval:  171 QRS Duration: 77 QT Interval:  374 QTC Calculation: 421 R Axis:   2 Text Interpretation: Sinus rhythm Atrial premature complexes in couplets Confirmed by Godfrey Pick [phone]) on 01/02/2022 1:53:38 PM  Radiology MR Lumbar Spine W Wo Contrast  Result Date: 01/02/2022 CLINICAL DATA:  Sudden onset bilateral leg weakness EXAM: MRI LUMBAR SPINE WITHOUT AND WITH CONTRAST TECHNIQUE: Multiplanar and multiecho pulse sequences of the lumbar spine were obtained without and with intravenous contrast. CONTRAST:  71mL GADAVIST GADOBUTROL 1 MMOL/ML IV SOLN COMPARISON:  Lumbar spine MRI 03/22/2021. FINDINGS: Segmentation: Standard; the lowest formed disc space is designated L5-S1. Alignment: There is grade 1 retrolisthesis of L2 on L3 and grade 1 anterolisthesis of L4 on L5, unchanged. There is slight dextrocurvature centered at L3-L4. Alignment is otherwise normal. Vertebrae: Vertebral body heights are preserved. Marrow signal is normal. There is no abnormal marrow edema or enhancement. Conus medullaris and cauda equina: Conus extends to the T12-L1 level. Conus and cauda equina appear normal. Paraspinal and other soft tissues: A lobulated T2 hyperintense lesion in the right kidney is unchanged, likely reflecting a cyst. The paraspinal soft tissues are unremarkable. Disc levels: There is multilevel disc desiccation and narrowing, most advanced  at L3-L4 and L5-S1. There is multilevel facet arthropathy, most advanced at L4-L5. There is an associated small effusion on the left. Findings are not significantly changed T12-L1: No significant spinal canal or neural foraminal stenosis L1-L2: There is a mild disc bulge and left worse than right facet arthropathy resulting in mild left and no significant right neural foraminal stenosis and no significant spinal canal stenosis, unchanged. L2-L3: There is grade 1 retrolisthesis with a mild bulge and mild bilateral facet arthropathy resulting in mild narrowing of the left subarticular zone and mild left and no significant right neural foraminal stenosis, not significantly changed. L3-L4: There is a mild disc bulge and mild bilateral facet arthropathy resulting in mild left and no significant right neural foraminal stenosis and no significant spinal canal stenosis, unchanged. L4-L5: There is grade 1 anterolisthesis with a mild bulge, ligamentum flavum thickening, and moderate bilateral facet arthropathy resulting in moderate spinal canal stenosis with crowding of the cauda equina nerve roots and subarticular zones with possible impingement of the traversing right L4 nerve root (7-32), and mild bilateral neural foraminal stenosis. Findings are unchanged. L5-S1: There is a mild disc bulge and left worse than right facet arthropathy resulting in narrowing of the bilateral subarticular zones, right more than left, and moderate right and no significant left neural foraminal stenosis. Extraforaminal disc material again contacts the exiting right L5 nerve root (9-5). Findings are unchanged. IMPRESSION: 1. Mild disc bulge, ligamentum flavum thickening, and bulky facet arthropathy at L4-L5 resulting in moderate spinal canal stenosis with crowding of the subarticular zones and cauda equina nerve roots with possible impingement of the traversing right L4 nerve root, and mild bilateral neural foraminal stenosis, unchanged since  03/22/2021. 2. Disc bulge and bilateral facet arthropathy at L5-S1 resulting in narrowing of the bilateral subarticular zones, right worse than left, and contact of the exiting right L5 nerve root by extraforaminal disc material, also not significantly changed. 3. Mild degenerative changes at the remaining levels as detailed above without other significant spinal canal or neural foraminal stenosis, and no other evidence of nerve root impingement. Electronically Signed   By: Valetta Mole M.D.   On: 01/02/2022 15:13   VAS [place] LOWER  EXTREMITY VENOUS (DVT) (ONLY [facility] & [facility])  Result Date: 01/02/2022  Lower Venous DVT Study Patient Name:  VINCENZO STAVE  Date of Exam:   01/02/2022 Medical Rec #: [id number]      Accession #:    [id number] Date of Birth: 1931/09/11       Patient Gender: M Patient Age:   36 years Exam Location:  [facility] Procedure:      VAS [place] LOWER EXTREMITY VENOUS (DVT) Referring Phys: Thurmond Butts Velicia Dejager --------------------------------------------------------------------------------  Indications: Swelling.  Risk Factors: None identified. Limitations: Poor ultrasound/tissue interface. Comparison Study: No prior studies. Performing Technologist: Oliver Hum RVT  Examination Guidelines: A complete evaluation includes B-mode imaging, spectral Doppler, color Doppler, and power Doppler as needed of all accessible portions of each vessel. Bilateral testing is considered an integral part of a complete examination. Limited examinations for reoccurring indications may be performed as noted. The reflux portion of the exam is performed with the patient in reverse Trendelenburg.  +---------+---------------+---------+-----------+----------+--------------+  RIGHT     Compressibility Phasicity Spontaneity Properties Thrombus Aging  +---------+---------------+---------+-----------+----------+--------------+  CFV       Full            Yes       Yes                                     +---------+---------------+---------+-----------+----------+--------------+  SFJ       Full                                                             +---------+---------------+---------+-----------+----------+--------------+  FV Prox   Full                                                             +---------+---------------+---------+-----------+----------+--------------+  FV Mid    Full                                                             +---------+---------------+---------+-----------+----------+--------------+  FV Distal Full                                                             +---------+---------------+---------+-----------+----------+--------------+  PFV       Full                                                             +---------+---------------+---------+-----------+----------+--------------+  POP       Full  Yes       Yes                                    +---------+---------------+---------+-----------+----------+--------------+  PTV       Full                                                             +---------+---------------+---------+-----------+----------+--------------+  PERO      Full                                                             +---------+---------------+---------+-----------+----------+--------------+   +----+---------------+---------+-----------+----------+--------------+  LEFT Compressibility Phasicity Spontaneity Properties Thrombus Aging  +----+---------------+---------+-----------+----------+--------------+  CFV  Full            Yes       Yes                                    +----+---------------+---------+-----------+----------+--------------+    Summary: RIGHT: - There is no evidence of deep vein thrombosis in the lower extremity.  - No cystic structure found in the popliteal fossa.  LEFT: - No evidence of common femoral vein obstruction.  *See table(s) above for measurements and observations.    Preliminary     Procedures Procedures     Medications Ordered in ED Medications  gadobutrol (GADAVIST) 1 MMOL/ML injection 6 mL (6 mLs Intravenous Contrast Given 01/02/22 1453)    ED Course/ Medical Decision Making/ A&P                           Medical Decision Making Amount and/or Complexity of Data Reviewed Labs: ordered. Radiology: ordered. ECG/medicine tests: ordered.  Risk Prescription drug management.   This patient presents to the ED for concern of BLE weakness (greater on the right), this involves an extensive number of treatment options, and is a complaint that carries with it a high risk of complications and morbidity.  The differential diagnosis includes myelopathy, myopathy, infection, electrolyte abnormalities, CVA   Co morbidities that complicate the patient evaluation  CLL, HTN, HLD, history of spinal stenosis, bilateral leg weakness, lumbar radiculopathy, and left-sided sciatica   Additional history obtained:  Additional history obtained from patient's wife External records from outside source obtained and reviewed including EMR   Lab Tests:  I Ordered, and personally interpreted labs.  The pertinent results include: (Lab results pending at time of signout)   Imaging Studies ordered:  I ordered imaging studies including DVT study of right lower extremity, MRI of lumbar spine I independently visualized and interpreted imaging which showed no evidence of DVT; MRI-similar findings when compared to MRI from 9 months ago.  There is an areas of possible impingement on right L4 and L5 nerve root, which does correlate with his symptoms.  Other areas of stenoses appear to be greater on the left, which does not correlate with his symptoms. I agree with the radiologist  interpretation   Cardiac Monitoring:  The patient was maintained on a cardiac monitor.  I personally viewed and interpreted the cardiac monitored which showed an underlying rhythm of: Sinus rhythm  Problem List / ED Course:  Very  pleasant 88 year old male presenting for acute onset of bilateral leg weakness that is greater on the right.  This occurred shortly prior to arrival when he attempted to stand up off of the toilet.  On arrival, patient is well-appearing.  He has pitting edema to his bilateral lower extremities that is greater on the right.  He states that this has not changed in the past several months.  He is not currently on any blood thinning medications.  DVT study of right lower extremity was ordered.  On neurologic exam, patient has no areas of diminished sensation.  He does have diminished strength in bilateral lower extremities with knee extension.  He has proximal leg  weakness that is greater on the right than the left.  Distal BLE strength is intact.  Patient does have a history of lumbar stenoses and documented bilateral leg weakness in the past.  It appears that the symptoms have acutely worsened today.  An MRI of his lumbar spine was ordered to assess for acute changes.  Currently, patient has no discomfort at rest.  No medications for analgesia are indicated.  Patient underwent MRI of the lumbar spine.  On his return to the ED, strength was reevaluated.  Patient was able to move to edge of bed unassisted.  He was able to stand without assistance.  When standing, he continued to endorse weakness in his right leg.  He does, however, state that his motor function has improved since the episode at home.  Given concern for possible small stroke, MRI brain was ordered.  This, along with laboratory work-up were pending at time of signout.  Care of patient was signed out to oncoming ED provider.   Reevaluation:  I reevaluated the patient and found that they have :improved   Social Determinants of Health:  Patient is highly functional and lives at home with his wife, who is also highly functional.   Dispostion:  After consideration of the diagnostic results and the patients response to treatment, I feel that the  patent would benefit from further work-up and reassessment.          Final Clinical Impression(s) / ED Diagnoses Final diagnoses:  Weakness of right lower extremity    Rx / DC Orders ED Discharge Orders     None         Godfrey Pick, MD 01/02/22 [phone]

## 2022-01-07 ENCOUNTER — Ambulatory Visit: Payer: Medicare Other | Admitting: Internal Medicine

## 2022-01-09 DIAGNOSIS — R531 Weakness: Secondary | ICD-10-CM | POA: Diagnosis not present

## 2022-01-09 DIAGNOSIS — I959 Hypotension, unspecified: Secondary | ICD-10-CM | POA: Diagnosis not present

## 2022-01-09 DIAGNOSIS — W19XXXA Unspecified fall, initial encounter: Secondary | ICD-10-CM | POA: Diagnosis not present

## 2022-01-10 ENCOUNTER — Encounter (HOSPITAL_BASED_OUTPATIENT_CLINIC_OR_DEPARTMENT_OTHER): Payer: Self-pay

## 2022-01-10 ENCOUNTER — Observation Stay (HOSPITAL_BASED_OUTPATIENT_CLINIC_OR_DEPARTMENT_OTHER)
Admission: EM | Admit: 2022-01-10 | Discharge: 2022-01-11 | Disposition: A | Discharge status: Home Health | Payer: Medicare Other | Attending: Internal Medicine | Admitting: Internal Medicine

## 2022-01-10 ENCOUNTER — Emergency Department (HOSPITAL_BASED_OUTPATIENT_CLINIC_OR_DEPARTMENT_OTHER): Payer: Medicare Other | Admitting: Radiology

## 2022-01-10 ENCOUNTER — Other Ambulatory Visit: Payer: Self-pay

## 2022-01-10 ENCOUNTER — Observation Stay (HOSPITAL_BASED_OUTPATIENT_CLINIC_OR_DEPARTMENT_OTHER): Payer: Medicare Other

## 2022-01-10 ENCOUNTER — Ambulatory Visit: Payer: Medicare Other | Admitting: Internal Medicine

## 2022-01-10 ENCOUNTER — Emergency Department (HOSPITAL_BASED_OUTPATIENT_CLINIC_OR_DEPARTMENT_OTHER): Payer: Medicare Other

## 2022-01-10 DIAGNOSIS — S51012A Laceration without foreign body of left elbow, initial encounter: Principal | ICD-10-CM | POA: Insufficient documentation

## 2022-01-10 DIAGNOSIS — C9110 Chronic lymphocytic leukemia of B-cell type not having achieved remission: Secondary | ICD-10-CM | POA: Diagnosis present

## 2022-01-10 DIAGNOSIS — M87059 Idiopathic aseptic necrosis of unspecified femur: Secondary | ICD-10-CM | POA: Diagnosis not present

## 2022-01-10 DIAGNOSIS — Z79899 Other long term (current) drug therapy: Secondary | ICD-10-CM | POA: Diagnosis not present

## 2022-01-10 DIAGNOSIS — R531 Weakness: Secondary | ICD-10-CM | POA: Insufficient documentation

## 2022-01-10 DIAGNOSIS — Z20822 Contact with and (suspected) exposure to covid-19: Secondary | ICD-10-CM | POA: Diagnosis not present

## 2022-01-10 DIAGNOSIS — Z7982 Long term (current) use of aspirin: Secondary | ICD-10-CM | POA: Diagnosis not present

## 2022-01-10 DIAGNOSIS — R601 Generalized edema: Secondary | ICD-10-CM | POA: Diagnosis not present

## 2022-01-10 DIAGNOSIS — S51802A Unspecified open wound of left forearm, initial encounter: Secondary | ICD-10-CM | POA: Diagnosis not present

## 2022-01-10 DIAGNOSIS — S81012A Laceration without foreign body, left knee, initial encounter: Secondary | ICD-10-CM | POA: Diagnosis not present

## 2022-01-10 DIAGNOSIS — S59902A Unspecified injury of left elbow, initial encounter: Secondary | ICD-10-CM | POA: Diagnosis present

## 2022-01-10 DIAGNOSIS — W1811XA Fall from or off toilet without subsequent striking against object, initial encounter: Secondary | ICD-10-CM | POA: Diagnosis not present

## 2022-01-10 DIAGNOSIS — I10 Essential (primary) hypertension: Secondary | ICD-10-CM | POA: Diagnosis not present

## 2022-01-10 DIAGNOSIS — M87052 Idiopathic aseptic necrosis of left femur: Secondary | ICD-10-CM | POA: Diagnosis present

## 2022-01-10 DIAGNOSIS — M87051 Idiopathic aseptic necrosis of right femur: Secondary | ICD-10-CM | POA: Diagnosis present

## 2022-01-10 DIAGNOSIS — R102 Pelvic and perineal pain: Secondary | ICD-10-CM | POA: Diagnosis not present

## 2022-01-10 DIAGNOSIS — R600 Localized edema: Secondary | ICD-10-CM | POA: Diagnosis present

## 2022-01-10 DIAGNOSIS — M879 Osteonecrosis, unspecified: Secondary | ICD-10-CM | POA: Diagnosis not present

## 2022-01-10 DIAGNOSIS — Z87891 Personal history of nicotine dependence: Secondary | ICD-10-CM | POA: Diagnosis not present

## 2022-01-10 DIAGNOSIS — R262 Difficulty in walking, not elsewhere classified: Secondary | ICD-10-CM | POA: Diagnosis present

## 2022-01-10 DIAGNOSIS — M19012 Primary osteoarthritis, left shoulder: Secondary | ICD-10-CM | POA: Diagnosis not present

## 2022-01-10 DIAGNOSIS — S41112A Laceration without foreign body of left upper arm, initial encounter: Secondary | ICD-10-CM

## 2022-01-10 LAB — CBC WITH DIFFERENTIAL/PLATELET
Abs Immature Granulocytes: 0.05 10*3/uL (ref 0.00–0.07)
Basophils Absolute: 0.1 10*3/uL (ref 0.0–0.1)
Basophils Relative: 0 %
Eosinophils Absolute: 0 10*3/uL (ref 0.0–0.5)
Eosinophils Relative: 0 %
HCT: 38.4 % — ABNORMAL LOW (ref 39.0–52.0)
Hemoglobin: 12.6 g/dL — ABNORMAL LOW (ref 13.0–17.0)
Immature Granulocytes: 0 %
Lymphocytes Relative: 86 %
Lymphs Abs: 30.3 10*3/uL — ABNORMAL HIGH (ref 0.7–4.0)
MCH: 30.9 pg (ref 26.0–34.0)
MCHC: 32.8 g/dL (ref 30.0–36.0)
MCV: 94.1 fL (ref 80.0–100.0)
Monocytes Absolute: 0.4 10*3/uL (ref 0.1–1.0)
Monocytes Relative: 1 %
Neutro Abs: 4.5 10*3/uL (ref 1.7–7.7)
Neutrophils Relative %: 13 %
Platelets: 156 10*3/uL (ref 150–400)
RBC: 4.08 MIL/uL — ABNORMAL LOW (ref 4.22–5.81)
RDW: 12.8 % (ref 11.5–15.5)
WBC: 35.3 10*3/uL — ABNORMAL HIGH (ref 4.0–10.5)
nRBC: 0.1 % (ref 0.0–0.2)

## 2022-01-10 LAB — URINALYSIS, ROUTINE W REFLEX MICROSCOPIC
Bilirubin Urine: NEGATIVE
Glucose, UA: NEGATIVE mg/dL
Hgb urine dipstick: NEGATIVE
Ketones, ur: 15 mg/dL — AB
Leukocytes,Ua: NEGATIVE
Nitrite: NEGATIVE
Protein, ur: 30 mg/dL — AB
Specific Gravity, Urine: 1.031 — ABNORMAL HIGH (ref 1.005–1.030)
pH: 5.5 (ref 5.0–8.0)

## 2022-01-10 LAB — BASIC METABOLIC PANEL
Anion gap: 8 (ref 5–15)
BUN: 20 mg/dL (ref 8–23)
CO2: 31 mmol/L (ref 22–32)
Calcium: 8.9 mg/dL (ref 8.9–10.3)
Chloride: 101 mmol/L (ref 98–111)
Creatinine, Ser: 0.62 mg/dL (ref 0.61–1.24)
GFR, Estimated: >60 mL/min (ref >60)
Glucose, Bld: 98 mg/dL (ref 70–99)
Potassium: 3.9 mmol/L (ref 3.5–5.1)
Sodium: 140 mmol/L (ref 135–145)

## 2022-01-10 LAB — HEPATIC FUNCTION PANEL
ALT: 27 U/L (ref 0–44)
AST: 41 U/L (ref 15–41)
Albumin: 2.9 g/dL — ABNORMAL LOW (ref 3.5–5.0)
Alkaline Phosphatase: 71 U/L (ref 38–126)
Bilirubin, Direct: 0.2 mg/dL (ref 0.0–0.2)
Indirect Bilirubin: 0.9 mg/dL (ref 0.3–0.9)
Total Bilirubin: 1.1 mg/dL (ref 0.3–1.2)
Total Protein: 4.7 g/dL — ABNORMAL LOW (ref 6.5–8.1)

## 2022-01-10 LAB — ECHOCARDIOGRAM COMPLETE
Area-P 1/2: 3.03 cm2
Height: 66 in
S' Lateral: 2.2 cm
Weight: 2160 oz

## 2022-01-10 LAB — VITAMIN B12: Vitamin B-12: 376 pg/mL (ref 180–914)

## 2022-01-10 LAB — RESP PANEL BY RT-PCR (FLU A&B, COVID) ARPGX2
Influenza A by PCR: NEGATIVE
Influenza B by PCR: NEGATIVE
SARS Coronavirus 2 by RT PCR: NEGATIVE

## 2022-01-10 MED ORDER — POLYVINYL ALCOHOL 1.4 % OP SOLN: 1.0000 [drp] | OPHTHALMIC | Status: DC | PRN

## 2022-01-10 MED ORDER — REFRESH P.M. OP OINT: TOPICAL_OINTMENT | OPHTHALMIC | Status: DC | PRN

## 2022-01-10 MED ORDER — SODIUM CHLORIDE 0.9 % IV BOLUS
500.0000 mL | Freq: Once | INTRAVENOUS | Status: AC
  Administered 2022-01-10: 500 mL via INTRAVENOUS

## 2022-01-10 MED ORDER — ENSURE ENLIVE PO LIQD
237.0000 mL | Freq: Two times a day (BID) | ORAL | Status: DC
  Administered 2022-01-10 – 2022-01-11 (×3): 237 mL via ORAL

## 2022-01-10 MED ORDER — ASPIRIN EC 81 MG PO TBEC
81.0000 mg | DELAYED_RELEASE_TABLET | Freq: Every day | ORAL | Status: DC
  Administered 2022-01-11: 81 mg via ORAL
  Filled 2022-01-10: qty 1

## 2022-01-10 MED ORDER — SODIUM CHLORIDE 0.9% FLUSH
3.0000 mL | Freq: Two times a day (BID) | INTRAVENOUS | Status: DC
  Administered 2022-01-10 – 2022-01-11 (×3): 3 mL via INTRAVENOUS

## 2022-01-10 MED ORDER — VITAMIN D3 25 MCG (1000 UNIT) PO TABS
2000.0000 [IU] | ORAL_TABLET | Freq: Every day | ORAL | Status: DC
  Administered 2022-01-11: 2000 [IU] via ORAL
  Filled 2022-01-10 (×2): qty 2

## 2022-01-10 MED ORDER — ACETAMINOPHEN 650 MG RE SUPP: 650.0000 mg | Freq: Four times a day (QID) | RECTAL | Status: DC | PRN

## 2022-01-10 MED ORDER — ENOXAPARIN SODIUM 40 MG/0.4ML IJ SOSY
40.0000 mg | PREFILLED_SYRINGE | INTRAMUSCULAR | Status: DC
  Administered 2022-01-10 – 2022-01-11 (×2): 40 mg via SUBCUTANEOUS
  Filled 2022-01-10 (×2): qty 0.4

## 2022-01-10 MED ORDER — SODIUM CHLORIDE 0.9% FLUSH: 3.0000 mL | INTRAVENOUS | Status: DC | PRN

## 2022-01-10 MED ORDER — ATORVASTATIN CALCIUM 10 MG PO TABS
20.0000 mg | ORAL_TABLET | Freq: Every day | ORAL | Status: DC
  Administered 2022-01-10: 20 mg via ORAL
  Filled 2022-01-10: qty 2

## 2022-01-10 MED ORDER — SODIUM CHLORIDE 0.9 % IV SOLN: 250.0000 mL | INTRAVENOUS | Status: DC | PRN

## 2022-01-10 MED ORDER — ACETAMINOPHEN 325 MG PO TABS
650.0000 mg | ORAL_TABLET | Freq: Four times a day (QID) | ORAL | Status: DC | PRN
  Administered 2022-01-11: 650 mg via ORAL
  Filled 2022-01-10: qty 2

## 2022-01-10 MED ORDER — HYDROCODONE-ACETAMINOPHEN 5-325 MG PO TABS
1.0000 | ORAL_TABLET | ORAL | Status: DC | PRN
  Administered 2022-01-10: 1 via ORAL
  Filled 2022-01-10: qty 1

## 2022-01-10 MED ORDER — ENSURE PO LIQD: 237.0000 mL | Freq: Two times a day (BID) | ORAL | Status: DC

## 2022-01-10 NOTE — Assessment & Plan Note (Signed)
Stage 0 on observation followed by oncology, Dr. Julien Nordmann Cbc at baseline, follow

## 2022-01-10 NOTE — Evaluation (Signed)
Physical Therapy Evaluation Patient Details Name: Jerry Gomez MRN: [id number] DOB: 25-Dec-1930 Today's Date: 01/10/2022  History of Present Illness  Patient is a 88 y/o male who presents on 01/09/22 after a mechanical fall in the bathroom. X-ray of the pelvis shows degenerative changes of the bilateral femoral heads with likely bilateral AVN right greater than left. Recent admission to [facility] for fall end of January. PMH includes CLL, HTN.  Clinical Impression  Patient presents with pain, decreased hip AROM RLE, impaired balance, generalized weakness and impaired mobility s/p above. Pt lives at home with wife and has had a recent decline in functional mobility with falls at home due to legs giving out in the last few weeks. Today, pt requires Min A-min guard assist for transfers and gait training. Pt with crepitus in bil hips with movement. Encouraged getting to chair 3 times daily and walking to bathroom with nursing. Pt has to negotiate 1 flight of stairs to get to bedroom and to enter home. Discussed making adjustments at home re: moving bedroom downstairs, getting chair lift etc.  Will follow acutely to maximize independence and mobility prior to return home. Pt did a stent of OPPT last fall and reports it made him worse and "they treated me like a 88 y/o;" hopefully he can get into an outpatient clinic with someone who specializes in geriatrics.     Recommendations for follow up therapy are one component of a multi-disciplinary discharge planning process, led by the attending physician.  Recommendations may be updated based on patient status, additional functional criteria and insurance authorization.  Follow Up Recommendations Outpatient PT    Assistance Recommended at Discharge Frequent or constant Supervision/Assistance  Patient can return home with the following  A little help with walking and/or transfers;A little help with bathing/dressing/bathroom;Assist for transportation;Help with stairs or  ramp for entrance;Assistance with cooking/housework    Equipment Recommendations None recommended by PT  Recommendations for Other Services       Functional Status Assessment Patient has had a recent decline in their functional status and demonstrates the ability to make significant improvements in function in a reasonable and predictable amount of time.     Precautions / Restrictions Precautions Precautions: Fall Restrictions Weight Bearing Restrictions: No      Mobility  Bed Mobility Overal bed mobility: Needs Assistance Bed Mobility: Sit to Supine       Sit to supine: Supervision, HOB elevated   General bed mobility comments: Able to bring LEs into bed to return to supine    Transfers Overall transfer level: Needs assistance Equipment used: Rolling walker (2 wheels) Transfers: Sit to/from Stand Sit to Stand: Min assist           General transfer comment: Min A to steady in standing with cues for hand placement/technique. Stood from Youth worker.    Ambulation/Gait Ambulation/Gait assistance: Min guard Gait Distance (Feet): 50 Feet Assistive device: Rolling walker (2 wheels) Gait Pattern/deviations: Step-to pattern, Decreased stance time - left, Decreased step length - right Gait velocity: decreased Gait velocity interpretation: <1.8 ft/sec, indicate of risk for recurrent falls   General Gait Details: Slow, guarded step gait with decreased heel strike on LLE during initial contact, crepitus noted bil hips. Very slow. Assist with RW navigation.  Stairs            Wheelchair Mobility    Modified Rankin (Stroke Patients Only)       Balance Overall balance assessment: Needs assistance Sitting-balance support: No upper extremity supported,  Feet supported Sitting balance-Leahy Scale: Fair     Standing balance support: During functional activity, Reliant on assistive device for balance Standing balance-Leahy Scale: Poor                                Pertinent Vitals/Pain Pain Assessment Pain Assessment: Faces Faces Pain Scale: Hurts little more Pain Location: BLEs, left>right, LUE Pain Descriptors / Indicators: Sore, Discomfort Pain Intervention(s): Monitored during session, Repositioned    Home Living Family/patient expects to be discharged to:: Private residence Living Arrangements: Spouse/significant other Available Help at Discharge: Family;Available 24 hours/day Type of Home: House Home Access: Stairs to enter Entrance Stairs-Rails: Left Entrance Stairs-Number of Steps: Back: 3 Alternate Level Stairs-Number of Steps: Flight Home Layout: Two level;Bed/bath upstairs Home Equipment: Conservation officer, nature (2 wheels);Rollator (4 wheels);Cane - single point;Adaptive equipment Additional Comments: Son is bringing them a shower seat and toilet elevated with handles    Prior Function Prior Level of Function : Independent/Modified Independent;Driving             Mobility Comments: Using the cane since last spring - maybe a year. Just recently been using walker at night. ADLs Comments: Performs BADLs. Wife performs cooking cleaning. Pt does medication management. Sinec last fall, wife has been doing all the driving     Hand Dominance   Dominant Hand: Right    Extremity/Trunk Assessment   Upper Extremity Assessment Upper Extremity Assessment: Defer to OT evaluation    Lower Extremity Assessment Lower Extremity Assessment: Generalized weakness;RLE deficits/detail;LLE deficits/detail RLE Deficits / Details: Limited hip flexion AROM secondary to pain, AVN, crepitus with movement LLE Deficits / Details: Crepitus with movement at hip joint    Cervical / Trunk Assessment Cervical / Trunk Assessment: Kyphotic  Communication   Communication: HOH  Cognition Arousal/Alertness: Awake/alert Behavior During Therapy: WFL for tasks assessed/performed Overall Cognitive Status: Within Functional Limits for tasks assessed                                           General Comments General comments (skin integrity, edema, etc.): Wife present.    Exercises     Assessment/Plan    PT Assessment Patient needs continued PT services  PT Problem List Decreased mobility;Decreased range of motion;Decreased strength;Pain;Decreased balance;Decreased knowledge of use of DME       PT Treatment Interventions Therapeutic activities;Gait training;Stair training;Balance training;Therapeutic exercise;Patient/family education;DME instruction;Modalities;Functional mobility training    PT Goals (Current goals can be found in the Care Plan section)  Acute Rehab PT Goals Patient Stated Goal: to stop falling, less pain PT Goal Formulation: With patient Time For Goal Achievement: 01/24/22 Potential to Achieve Goals: Good    Frequency Min 3X/week     Co-evaluation               AM-PAC PT "6 Clicks" Mobility  Outcome Measure Help needed turning from your back to your side while in a flat bed without using bedrails?: A Little Help needed moving from lying on your back to sitting on the side of a flat bed without using bedrails?: A Little Help needed moving to and from a bed to a chair (including a wheelchair)?: A Little Help needed standing up from a chair using your arms (e.g., wheelchair or bedside chair)?: A Little Help needed to walk in hospital room?: A Little  Help needed climbing 3-5 steps with a railing? : A Little 6 Click Score: 18    End of Session Equipment Utilized During Treatment: Gait belt Activity Tolerance: Patient tolerated treatment well;Patient limited by pain Patient left: in bed;with call bell/phone within reach;with bed alarm set;with family/visitor present Nurse Communication: Mobility status PT Visit Diagnosis: Pain;History of falling (Z91.81);Other abnormalities of gait and mobility (R26.89);Unsteadiness on feet (R26.81) Pain - Right/Left: Left (bil) Pain - part of body:  Hip;Arm    Time: 5621-3086 PT Time Calculation (min) (ACUTE ONLY): 17 min   Charges:   PT Evaluation $PT Eval Moderate Complexity: 1 Mod          Marisa Severin, PT, DPT Acute Rehabilitation Services Pager [phone] Office [phone]     Marguarite Arbour A Sabra Heck 01/10/2022, 4:14 PM

## 2022-01-10 NOTE — ED Notes (Signed)
Updated wife by phone

## 2022-01-10 NOTE — Assessment & Plan Note (Signed)
-  discussed with patient family his OA in his right hip is severe, bone on bone.  -could be a big factor in his falls -discussed with on call ortho, Dr. Sammuel Hines. Treat conservatively and they will see him in outpatient to discuss surgical options  -PT ordered for mobility/falls -norco prn for pain, although miraculously, he does not have much pain

## 2022-01-10 NOTE — Plan of Care (Signed)
Patient received from ED, A&O x4, VSS no acute distress noticed, denies SOB and CP, will continue to monitor.

## 2022-01-10 NOTE — Assessment & Plan Note (Addendum)
88 year old with hx of spinal stenosis and weakness presenting with worsening right LE weakness and 2 falls -admit to telemetry -pelvic xray shows severe OA in bilateral hips, R>L and bilateral avascular necrosis. Ortho consulted see above, could be big contribution to this -known spinal stenosis with possible impingement, not changed from April 2022 and neurosurgery did not feel contributing to symptoms. -checking orthostatics, holding lasix could be contributing to falls getting up -TSH checked recently and wnl, will check b12/ANA  -consider neuro eval  -PT to eval/tx

## 2022-01-10 NOTE — Assessment & Plan Note (Addendum)
Chronic issue over a year, but has been noticeably worse over past few weeks around ankles -renal function wnl, checking hepatic panel -checking echo -TED hose -no findings concerning for DVT. Had negative doppler of RLE one week ago  -likely venous stasis in setting of 88 year old if above work up wnl

## 2022-01-10 NOTE — Evaluation (Signed)
Occupational Therapy Evaluation Patient Details Name: Jerry Gomez MRN: [id number] DOB: Aug 04, 1931 Today's Date: 01/10/2022   History of Present Illness Patient is a 88 y/o male who presents on 01/09/22 after a mechanical fall in the bathroom. X-ray of the pelvis shows degenerative changes of the bilateral femoral heads with likely bilateral AVN right greater than left. Recent admission to [facility] for fall again. PMH includes CLL, HTN.   Clinical Impression   PTA, pt was living with his wife and was independent with ADLs and using SPC and RW for mobility. Currently, pt requires Min guard-Min A for ADLs and functional mobility using RW. Pt presenting with decreased balance and strength impacting his safety with functional mobility and standing ADLs. Pt would benefit from further acute OT to facilitate safe dc. Recommend dc to home once medically stable per physician.       Recommendations for follow up therapy are one component of a multi-disciplinary discharge planning process, led by the attending physician.  Recommendations may be updated based on patient status, additional functional criteria and insurance authorization.   Follow Up Recommendations  Other (comment) (Follow up with OP PT)    Assistance Recommended at Discharge Frequent or constant Supervision/Assistance  Patient can return home with the following A little help with walking and/or transfers;Assistance with cooking/housework;Assist for transportation    Functional Status Assessment  Patient has had a recent decline in their functional status and demonstrates the ability to make significant improvements in function in a reasonable and predictable amount of time.  Equipment Recommendations  None recommended by OT    Recommendations for Other Services PT consult     Precautions / Restrictions Precautions Precautions: Fall      Mobility Bed Mobility Overal bed mobility: Needs Assistance Bed Mobility: Supine to Sit      Supine to sit: Mod assist     General bed mobility comments: Mod A for elevating trunk    Transfers Overall transfer level: Needs assistance Equipment used: Rolling walker (2 wheels) Transfers: Sit to/from Stand Sit to Stand: Min assist           General transfer comment: Min A for slight posterior lean      Balance Overall balance assessment: Needs assistance Sitting-balance support: No upper extremity supported, Feet supported Sitting balance-Leahy Scale: Fair     Standing balance support: Bilateral upper extremity supported, During functional activity Standing balance-Leahy Scale: Poor                             ADL either performed or assessed with clinical judgement   ADL Overall ADL's : Needs assistance/impaired Eating/Feeding: Set up;Sitting   Grooming: Min guard;Wash/dry face;Standing   Upper Body Bathing: Minimal assistance;Sitting   Lower Body Bathing: Min guard;Sit to/from stand   Upper Body Dressing : Min guard;Standing Upper Body Dressing Details (indicate cue type and reason): doffed robe Lower Body Dressing: Min guard;Sit to/from stand Lower Body Dressing Details (indicate cue type and reason): Uses sock aide at home. Min guard A for safety. significant time Toilet Transfer: Min guard;Ambulation;Regular Toilet;Grab bars;Rolling walker (2 wheels) Toilet Transfer Details (indicate cue type and reason): Educating on safe sit<>stand with walker Toileting- Clothing Manipulation and Hygiene: Min guard;Sit to/from stand;Sitting/lateral lean       Functional mobility during ADLs: Min guard;Rolling walker (2 wheels) General ADL Comments: pt performing functional mobility in room, toileting, hand hgyiene, and UB dressing. demonstrating decreased balance, strength, and activity tolerance.  Vision Baseline Vision/History: 1 Wears glasses Patient Visual Report: Other (comment) (s/p cataract sx)       Perception     Praxis       Pertinent Vitals/Pain       Hand Dominance Right   Extremity/Trunk Assessment Upper Extremity Assessment Upper Extremity Assessment: Generalized weakness   Lower Extremity Assessment Lower Extremity Assessment: Defer to PT evaluation   Cervical / Trunk Assessment Cervical / Trunk Assessment: Kyphotic   Communication Communication Communication: HOH   Cognition Arousal/Alertness: Awake/alert Behavior During Therapy: WFL for tasks assessed/performed Overall Cognitive Status: Within Functional Limits for tasks assessed                                       General Comments  Wife present throughout session    Exercises     Shoulder Instructions      Home Living Family/patient expects to be discharged to:: Private residence Living Arrangements: Spouse/significant other Available Help at Discharge: Family;Available 24 hours/day Type of Home: House Home Access: Stairs to enter [employer] of Steps: Back: 3 Entrance Stairs-Rails: Left Home Layout: Two level;Bed/bath upstairs Alternate Level Stairs-Number of Steps: Flight Alternate Level Stairs-Rails: Left Bathroom Shower/Tub: Occupational psychologist: Standard     Home Equipment: Conservation officer, nature (2 wheels);Rollator (4 wheels);Cane - single point;Adaptive equipment Adaptive Equipment: Sock aid Additional Comments: Son is bringing them a shower seat and toilet elevated with handles      Prior Functioning/Environment Prior Level of Function : Independent/Modified Independent;Driving             Mobility Comments: Using the cane since last spring - maybe a year. Just recently been using walker at night. ADLs Comments: Performs BADLs. Wife performs cooking cleaning. Pt does medication management. Sinec last fall, wife has been doing all the driving        OT Problem List: Decreased strength;Decreased activity tolerance;Decreased range of motion;Impaired balance (sitting and/or  standing);Decreased knowledge of use of DME or AE;Decreased knowledge of precautions;Pain      OT Treatment/Interventions: Self-care/ADL training;Therapeutic exercise;Energy conservation;DME and/or AE instruction;Therapeutic activities;Visual/perceptual remediation/compensation    OT Goals(Current goals can be found in the care plan section) Acute Rehab OT Goals Patient Stated Goal: Go home OT Goal Formulation: With patient/family Time For Goal Achievement: 01/24/22 Potential to Achieve Goals: Good  OT Frequency: Min 2X/week    Co-evaluation              AM-PAC OT "6 Clicks" Daily Activity     Outcome Measure Help from another person eating meals?: A Little Help from another person taking care of personal grooming?: A Little Help from another person toileting, which includes using toliet, bedpan, or urinal?: A Little Help from another person bathing (including washing, rinsing, drying)?: A Little Help from another person to put on and taking off regular upper body clothing?: A Little Help from another person to put on and taking off regular lower body clothing?: A Little 6 Click Score: 18   End of Session Equipment Utilized During Treatment: Rolling walker (2 wheels) Nurse Communication: Mobility status  Activity Tolerance: Patient tolerated treatment well Patient left: in chair;with call bell/phone within reach;with family/visitor present;Other (comment) (with PT)  OT Visit Diagnosis: Unsteadiness on feet (R26.81);Other abnormalities of gait and mobility (R26.89);Muscle weakness (generalized) (M62.81);Pain Pain - Right/Left: Left Pain - part of body: Shoulder  Time: 4370-0525 OT Time Calculation (min): 22 min Charges:  OT General Charges $OT Visit: 1 Visit OT Evaluation $OT Eval Moderate Complexity: Sharon, OTR/L Acute Rehab Pager: [phone] Office: [place] 01/10/2022, 3:50 PM

## 2022-01-10 NOTE — ED Notes (Signed)
Pt able to stand at bedside x1 assist, appears to be very unsteady. Pt states that his legs feel weak and that this is why he has been falling recently. Pt unable to take steps independently, moves feet in a shuffling motion when assisted.

## 2022-01-10 NOTE — ED Provider Notes (Signed)
[place] EMERGENCY DEPT Provider Note   CSN: [id number] Arrival date & time: 01/10/22  0011     History  Chief Complaint  Patient presents with   Jerry Gomez is a 88 y.o. male.  HPI     This is an 88 year old male with a history of CLL, hypertension and hyperlipidemia who presents following a fall.  Patient reports that he stood up from the toilet and his legs "gave out on him."  He fell to the floor.  He denies any loss of consciousness.  He denies syncope.  He states that he had a similar incident 1 week ago and was seen and evaluated at [facility].  At that time he felt that his right leg was more weak than his left.  Patient denies hitting his head.  At this time he denies any pain.  He has not had any recent illnesses.  Patient states that he is healthy and lives independently with his wife at home.  Chart reviewed.  Patient had basic lab work and MRI head and lumbar spine.  MRI was negative for acute stroke.  Lumbar spine showed some right-sided nerve compression no frank cord edema or injury.  He has primary care office visit notes that indicate generalized weakness and difficulty ambulating with gait disturbance as far back as November. Home Medications Prior to Admission medications   Medication Sig Start Date End Date Taking? Authorizing Provider  Artificial Tear Ointment (ARTIFICIAL TEARS) ointment Place into both eyes nightly.    [provider]  aspirin 81 MG EC tablet Take 81 mg by mouth daily. Swallow whole.    [provider]  atorvastatin (LIPITOR) 20 MG tablet Take 1 tablet (20 mg total) by mouth daily. 10/17/21   [name], [name], MD  Cholecalciferol (VITAMIN D) 2000 units tablet Take 2,000 Units by mouth daily.    [provider]  furosemide (LASIX) 20 MG tablet Take 1 tablet (20 mg total) by mouth daily. 10/17/21   [name], [name], MD  metoprolol succinate (TOPROL-XL) 50 MG 24 hr tablet TAKE 1  TABLET BY MOUTH DAILY WITH OR IMMEDIATELY FOLLOWING A MEAL. 10/17/21   [name], [name], MD  Multiple Vitamins-Minerals (CENTRUM SILVER ULTRA MENS PO) Take 1 tablet by mouth.    [provider]      Allergies    Patient has no known allergies.    Review of Systems   Review of Systems  Respiratory:  Negative for shortness of breath.   Cardiovascular:  Negative for chest pain.  Neurological:  Positive for weakness.  All other systems reviewed and are negative.  Physical Exam Updated Vital Signs BP (!) 128/59    Pulse 80    Temp 98.1 F (36.7 C) (Oral)    Resp 16    Ht 1.676 m (5\' 6" )    Wt 61.2 kg    SpO2 100%    BMI 21.79 kg/m  Physical Exam Vitals and nursing note reviewed.  Constitutional:      Appearance: He is well-developed. He is not ill-appearing.  HENT:     Head: Normocephalic and atraumatic.     Nose: Nose normal.     Mouth/Throat:     Mouth: Mucous membranes are moist.  Eyes:     Pupils: Pupils are equal, round, and reactive to light.  Cardiovascular:     Rate and Rhythm: Normal rate and regular rhythm.     Heart sounds: Normal heart sounds.  No murmur heard. Pulmonary:     Effort: Pulmonary effort is normal. No respiratory distress.     Breath sounds: Normal breath sounds. No wheezing.  Abdominal:     Palpations: Abdomen is soft.     Tenderness: There is no abdominal tenderness. There is no rebound.  Musculoskeletal:     Cervical back: Neck supple.     Comments: Normal range of motion bilateral hips and knees, 2+ pitting edema bilateral lower extremities  Lymphadenopathy:     Cervical: No cervical adenopathy.  Skin:    General: Skin is warm and dry.     Comments: Skin tear left elbow, left knee  Neurological:     Mental Status: He is alert and oriented to person, place, and time.     Comments: Patient with 3 out of 5 proximal muscle strength with hip flexion on the right, knee extension, plantar and dorsiflexion of the foot appear intact this is  asymmetric compared to left  Psychiatric:        Mood and Affect: Mood normal.    ED Results / Procedures / Treatments   Labs (all labs ordered are listed, but only abnormal results are displayed) Labs Reviewed  CBC WITH DIFFERENTIAL/PLATELET - Abnormal; Notable for the following components:      Result Value   WBC 35.3 (*)    RBC 4.08 (*)    Hemoglobin 12.6 (*)    HCT 38.4 (*)    Lymphs Abs 30.3 (*)    All other components within normal limits  URINALYSIS, ROUTINE W REFLEX MICROSCOPIC - Abnormal; Notable for the following components:   Specific Gravity, Urine 1.031 (*)    Ketones, ur 15 (*)    Protein, ur 30 (*)    All other components within normal limits  RESP PANEL BY RT-PCR (FLU A&B, COVID) ARPGX2  BASIC METABOLIC PANEL  PATHOLOGIST SMEAR REVIEW    EKG None  Radiology DG Pelvis 1-2 Views  Result Date: 01/10/2022 CLINICAL DATA:  Fall, hip pain. EXAM: PELVIS - 1-2 VIEW COMPARISON:  None. FINDINGS: The bones are diffusely markedly osteopenic. There is no evidence for acute fracture or dislocation. There are changes of avascular necrosis in the bilateral femoral heads with subchondral cystic change and flattening. There is severe joint space narrowing of both hips, right greater than left. There is bone-on-bone configuration on the right. Soft tissues are within normal limits. IMPRESSION: 1. No acute fracture or dislocation. 2. 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 3. Severe bilateral hip degenerative change, right greater than left. 4. Diffuse osteopenia. Electronically Signed   By: Ronney Asters M.D.   On: 01/10/2022 01:33   DG Elbow Complete Left  Result Date: 01/10/2022 CLINICAL DATA:  Recent fall with skin tear, initial encounter EXAM: LEFT ELBOW - COMPLETE 3+ VIEW COMPARISON:  None. FINDINGS: There is no evidence of fracture, dislocation, or joint effusion. There is no evidence of arthropathy or other focal bone abnormality. Soft tissues are  unremarkable. IMPRESSION: No acute abnormality noted. Electronically Signed   By: Inez Catalina M.D.   On: 01/10/2022 03:12   DG Shoulder Left  Result Date: 01/10/2022 CLINICAL DATA:  Left shoulder pain. EXAM: LEFT SHOULDER - 2+ VIEW COMPARISON:  None. FINDINGS: There is no evidence of fracture or dislocation. There are mild degenerative changes of the acromioclavicular joint. Soft tissues are unremarkable. IMPRESSION: Negative. Electronically Signed   By: Ronney Asters M.D.   On: 01/10/2022 01:34    Procedures Procedures  Medications Ordered in ED Medications - No data to display  ED Course/ Medical Decision Making/ A&P Clinical Course as of 01/10/22 0346  Thu Jan 10, 2022  0209 Per nursing, patient very unsteady on his feet and high fall risk.  Shuffling gait.  Labs added.  Discussed admission with the patient and his wife for physical therapy evaluation. [CH]    Clinical Course User Index [CH] Adrena Nakamura, Barbette Hair, MD                           Medical Decision Making Amount and/or Complexity of Data Reviewed Labs: ordered. Radiology: ordered.  Risk Decision regarding hospitalization.   This patient presents to the ED for concern of weakness, fall, this involves an extensive number of treatment options, and is a complaint that carries with it a high risk of complications and morbidity.  The differential diagnosis includes acute stroke, radiculopathy, generalized weakness, systemic illness, metabolic derangements, generalized deconditioning  MDM:    This is an 88 year old male who lives independently with his wife who presents following a fall.  He stood up from the toilet and states that he felt weak and fell to the floor.  He is awake, alert, oriented.  He denies significant injury.  He does have skin tears to the left upper extremity.  He also has what appears to be mostly proximal weakness in the right lower extremity.  I reviewed his prior charts and recently he had an MRI  brain and lumbar spine.  X-ray of the pelvis shows degenerative changes of the bilateral femoral heads with likely bilateral avascular necrosis right greater than left.  Question whether his legs giving out may be related to this versus true weakness.  Upon ambulation, patient was unable to ambulate safely independently.  Because of this, labs were obtained.  He has persistent leukocytosis consistent with his prior CLL.  Otherwise labs are unremarkable.  X-rays of the left upper extremity without evidence of fracture.  Skin tears were dressed at bedside.  Plan for admission for PT evaluation as well as potential neurologic evaluation given weakness.  I suspect he will ultimately need orthopedic consultation given his x-ray findings although this may be deferred to outpatient setting. (Labs, imaging)  Labs: I Ordered, and personally interpreted labs.  The pertinent results include: Leukocytosis  Imaging Studies ordered: I ordered imaging studies including pelvis, left upper extremity x-rays I independently visualized and interpreted imaging. I agree with the radiologist interpretation  Additional history obtained from wife.  External records from outside source obtained and reviewed including prior primary visit  Critical Interventions: n/a  Consultations: I requested consultation with the none,  and discussed lab and imaging findings as well as pertinent plan - they recommend: None  Cardiac Monitoring: The patient was maintained on a cardiac monitor.  I personally viewed and interpreted the cardiac monitored which showed an underlying rhythm of: Normal sinus rhythm  Reevaluation: After the interventions noted above, I reevaluated the patient and found that they have :stayed the same   Considered admission for: Deconditioning, generalized weakness, gait disturbance  Social Determinants of Health: Lives independently with his wife at home  Disposition: Admit  Co morbidities that  complicate the patient evaluation  Past Medical History:  Diagnosis Date   Cancer ([place])    CLL   Cataract    COLONIC POLYPS 03/02/2009   EYE SURGERY, HX OF 09/05/2007   HEMORRHOIDS, HX OF    HYPERLIPIDEMIA 09/05/2007  HYPERTENSION 07/25/2007   OSTEOARTHRITIS 09/05/2007   SLEEP APNEA, OBSTRUCTIVE 09/05/2007     Medicines No orders of the defined types were placed in this encounter.   I have reviewed the patients home medicines and have made adjustments as needed  Problem List / ED Course: Problem List Items Addressed This Visit       Other   CLL (chronic lymphocytic leukemia) ([place])   Other Visit Diagnoses     General weakness    -  Primary   Avascular necrosis of bone of hip, unspecified laterality (HCC)       Skin tear of left upper arm without complication, initial encounter                       Final Clinical Impression(s) / ED Diagnoses Final diagnoses:  General weakness  CLL (chronic lymphocytic leukemia) ([place])  Avascular necrosis of bone of hip, unspecified laterality ([place])  Skin tear of left upper arm without complication, initial encounter    Rx / DC Orders ED Discharge Orders     None         Willadean Guyton, Barbette Hair, MD 01/10/22 [id number]

## 2022-01-10 NOTE — ED Triage Notes (Signed)
Mechanical fall tonight.  Stood up from toilet and fell to linoleum floor.  Denies any loss of consciousness, was incontinent. Does have skin tears to left arm elbow and some swelling to left knee.  Was at [facility] one week ago for a fall with complete work up.

## 2022-01-10 NOTE — H&P (Signed)
History and Physical    Patient: Jerry Gomez DPO:[id number] DOB: 20-Aug-1931 DOA: 01/10/2022 DOS: the patient was seen and examined on 01/10/2022 PCP: [name], [name], MD  Patient coming from:  [facility]  - lives with his wife at home. Uses walker to ambulate.   Chief Complaint: fall and right leg weakness   HPI: Jerry Gomez is a 88 y.o. male with medical history significant of HTN, HLD, spinal stenosis with difficulty walking, OSA who presented to ED with fall and leg weakness. Yesterday evening, around 9:00pm he was in his bathroom to take a shower.  When he got up off the commode he fell and he felt like his right leg gave out. He fell onto his left shoulder/arm. He did not hit his head. He did not lose conciousness, was not nauseated. His wife states he wore himself trying to stand up and he couldn't stand up so they called 911. He has mild lower back pain/hip pain, but nothing that is severe. He states its "minor." He has no numbness in his lower legs, states maybe some tingling at times. He has history of spinal stenosis with weakness, but weakness of right lower leg seems worse than baseline.   He fell a week ago and was seen in ED. He states it was very similar. He was getting off commode and his right leg gave way and he fell. He was seen in ED with imaging done with spinal MRI. He has no history of falls prior to this first fall a week ago. During this ED visit MRI spine discussed with neurosurgery, Dr. Ronnald Ramp with L4/l5 moderate spinal canal stenosis, possible impingement of right L4 nerve root, unchanged from April 2022. He states does not feel like these findings would explain his symptoms from this visit. DVT study of RLE also done and negative as well as brain MRI for stroke.   Overall has been feeling fine. Denies any fever/chills, headaches/vision changes, chest pain or palpitations, shortness of breath or cough, no orthopnea, no abdominal pain, no N/V/D, no dysuria. Bilateral LE  edema slightly worse than baseline.   ER Course:   Vitals: afebrile, bp: 130/50, HR: 77, RR: 17, oxygen: 100%RA Pertinent labs: WBC 35.3, hgb: 12.6, UA: 15 ketones.  Pelvic xray: changes of avascular necrosis in bilateral femoral heads with subchondral cystic change and flattening. Severe joint space narrowing of both hips, right greater than left. Bone on bone on right.   Review of Systems: As mentioned in the history of present illness. All other systems reviewed and are negative. Past Medical History:  Diagnosis Date   Cancer ([place])    CLL   Cataract    COLONIC POLYPS 03/02/2009   EYE SURGERY, HX OF 09/05/2007   HEMORRHOIDS, HX OF    HYPERLIPIDEMIA 09/05/2007   HYPERTENSION 07/25/2007   OSTEOARTHRITIS 09/05/2007   SLEEP APNEA, OBSTRUCTIVE 09/05/2007   Past Surgical History:  Procedure Laterality Date   EYE SURGERY  10/22/13   left eye, cataract   Social History:  reports that he quit smoking about 58 years ago. His smoking use included cigarettes. He has never used smokeless tobacco. He reports current alcohol use of about 14.0 standard drinks per week. He reports that he does not use drugs.  No Known Allergies  Family History  Problem Relation Age of Onset   Kidney disease Father    Cancer Father        prostate-late on-set   Cancer Brother  non-hodgkins lymphoma   Colon cancer Neg Hx    Esophageal cancer Neg Hx    Rectal cancer Neg Hx    Stomach cancer Neg Hx     Prior to Admission medications   Medication Sig Start Date End Date Taking? Authorizing Provider  aspirin 81 MG EC tablet Take 81 mg by mouth daily. Swallow whole.   Yes [provider]  atorvastatin (LIPITOR) 20 MG tablet Take 1 tablet (20 mg total) by mouth daily. 10/17/21  Yes [name], [name], MD  Cholecalciferol (VITAMIN D) 2000 units tablet Take 2,000 Units by mouth daily.   Yes [provider]  Ensure (ENSURE) Take 237 mLs by mouth.   Yes [provider]  furosemide  (LASIX) 20 MG tablet Take 1 tablet (20 mg total) by mouth daily. 10/17/21  Yes [name], [name], MD  metoprolol succinate (TOPROL-XL) 50 MG 24 hr tablet TAKE 1 TABLET BY MOUTH DAILY WITH OR IMMEDIATELY FOLLOWING A MEAL. 10/17/21  Yes [name], [name], MD  Multiple Vitamins-Minerals (CENTRUM SILVER ULTRA MENS PO) Take 1 tablet by mouth.   Yes [provider]  Multiple Vitamins-Minerals (ICAPS AREDS 2 PO) Take 1 tablet by mouth 2 (two) times daily.   Yes [provider]  Artificial Tear Ointment (ARTIFICIAL TEARS) ointment Place into both eyes nightly.    [provider]    Physical Exam: Vitals:   01/10/22 0715 01/10/22 1000 01/10/22 1152 01/10/22 1318  BP: (!) 116/52 (!) 124/56 (!) 104/47   Pulse: 78 69 74   Resp: 18 18    Temp: 98 F (36.7 C)  97.9 F (36.6 C)   TempSrc: Oral  Oral   SpO2: 99% 97% 100% 100%  Weight:      Height:       General:  Appears calm and comfortable and is in NAD Eyes:  PERRL, EOMI, normal lids, iris ENT:  grossly normal hearing, lips & tongue, mmm; appropriate dentition Neck:  no LAD, masses or thyromegaly; no carotid bruits Cardiovascular:  RRR, no m/r/g. 3+ pitting edema above ankles bilaterally  Respiratory:   CTA bilaterally with no wheezes/rales/rhonchi.  Normal respiratory effort. Abdomen:  soft, NT, ND, NABS Back:   normal alignment, no CVAT Skin:  no rash or induration seen on limited exam. Skin tear of left upper arm, wrapped with large ecchymosis.  Musculoskeletal:  grossly normal tone BUE/BLE, good ROM, no bony abnormality Lower extremity:   Limited foot exam with no ulcerations.  2+ distal pulses. Psychiatric:  grossly normal mood and affect, speech fluent and appropriate, AOx3 Neurologic:  CN 2-12 grossly intact, moves all extremities in coordinated fashion, sensation intact   Radiological Exams on Admission: Independently reviewed - see discussion in A/P where applicable  DG Pelvis 1-2 Views  Result  Date: 01/10/2022 CLINICAL DATA:  Fall, hip pain. EXAM: PELVIS - 1-2 VIEW COMPARISON:  None. FINDINGS: The bones are diffusely markedly osteopenic. There is no evidence for acute fracture or dislocation. There are changes of avascular necrosis in the bilateral femoral heads with subchondral cystic change and flattening. There is severe joint space narrowing of both hips, right greater than left. There is bone-on-bone configuration on the right. Soft tissues are within normal limits. IMPRESSION: 1. No acute fracture or dislocation. 2. 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 3. Severe bilateral hip degenerative change, right greater than left. 4. Diffuse osteopenia. Electronically Signed   By: Ronney Asters M.D.   On: 01/10/2022 01:33   DG  Elbow Complete Left  Result Date: 01/10/2022 CLINICAL DATA:  Recent fall with skin tear, initial encounter EXAM: LEFT ELBOW - COMPLETE 3+ VIEW COMPARISON:  None. FINDINGS: There is no evidence of fracture, dislocation, or joint effusion. There is no evidence of arthropathy or other focal bone abnormality. Soft tissues are unremarkable. IMPRESSION: No acute abnormality noted. Electronically Signed   By: Inez Catalina M.D.   On: 01/10/2022 03:12   DG Shoulder Left  Result Date: 01/10/2022 CLINICAL DATA:  Left shoulder pain. EXAM: LEFT SHOULDER - 2+ VIEW COMPARISON:  None. FINDINGS: There is no evidence of fracture or dislocation. There are mild degenerative changes of the acromioclavicular joint. Soft tissues are unremarkable. IMPRESSION: Negative. Electronically Signed   By: Ronney Asters M.D.   On: 01/10/2022 01:34    EKG: Independently reviewed.  NSR with rate 61; nonspecific ST changes with no evidence of acute ischemia   Labs on Admission: I have personally reviewed the available labs and imaging studies at the time of the admission.  Pertinent labs:   WBC 35.3,  hgb: 12.6,  UA: 15 ketones.   Assessment and Plan: * Difficulty walking with leg  weakness - (present on admission) 88 year old with hx of spinal stenosis and weakness presenting with worsening right LE weakness and 2 falls -admit to telemetry -pelvic xray shows severe OA in bilateral hips, R>L and bilateral avascular necrosis. Ortho consulted see above, could be big contribution to this -known spinal stenosis with possible impingement, not changed from April 2022 and neurosurgery did not feel contributing to symptoms. -checking orthostatics, holding lasix could be contributing to falls getting up -TSH checked recently and wnl, will check b12/ANA  -consider neuro eval  -PT to eval/tx    Avascular necrosis of bones of both hips with severe OA of bilateral hips - (present on admission) -discussed with patient family his OA in his right hip is severe, bone on bone.  -could be a big factor in his falls -discussed with on call ortho, Dr. Sammuel Hines. Treat conservatively and they will see him in outpatient to discuss surgical options  -PT ordered for mobility/falls -norco prn for pain, although miraculously, he does not have much pain   Essential hypertension- (present on admission) Soft to low blood pressures ? If orthostasis contributing to falls -check orthostatic vitals -UA with ketones, 500cc bolus and encouraged PO intake -hold lasix/metorpolol today -TED hose  Bilateral lower extremity edema- (present on admission) Chronic issue over a year, but has been noticeably worse over past few weeks around ankles -renal function wnl, checking hepatic panel -checking echo -TED hose -no findings concerning for DVT. Had negative doppler of RLE one week ago  -likely venous stasis in setting of 88 year old if above work up wnl   CLL (chronic lymphocytic leukemia) ([place])- (present on admission) Stage 0 on observation followed by oncology, Dr. Lethea Killings at baseline, follow      Advance Care Planning:   Code Status: DNR   Consults: ortho: dr. Sammuel Hines   DVT Prophylaxis:  lovenox  Family Communication: wife in room: Jim Like and spoke with his son, Legrand Como on the phone   Severity of Illness: The appropriate patient status for this patient is INPATIENT. Inpatient status is judged to be reasonable and necessary in order to provide the required intensity of service to ensure the patient's safety. The patient's presenting symptoms, physical exam findings, and initial radiographic and laboratory data in the context of their chronic comorbidities is felt to place  them at high risk for further clinical deterioration. Furthermore, it is not anticipated that the patient will be medically stable for discharge from the hospital within 2 midnights of admission.   * I certify that at the point of admission it is my clinical judgment that the patient will require inpatient hospital care spanning beyond 2 midnights from the point of admission due to high intensity of service, high risk for further deterioration and high frequency of surveillance required.*  Author: Orma Flaming, MD 01/10/2022 4:33 PM  For on call review [email or web address].

## 2022-01-10 NOTE — Progress Notes (Signed)
°  Echocardiogram 2D Echocardiogram has been performed.  Jerry Gomez 01/10/2022, 4:53 PM

## 2022-01-10 NOTE — Assessment & Plan Note (Signed)
Soft to low blood pressures ? If orthostasis contributing to falls -check orthostatic vitals -UA with ketones, 500cc bolus and encouraged PO intake -hold lasix/metorpolol today -TED hose

## 2022-01-11 DIAGNOSIS — R600 Localized edema: Secondary | ICD-10-CM

## 2022-01-11 DIAGNOSIS — C9110 Chronic lymphocytic leukemia of B-cell type not having achieved remission: Secondary | ICD-10-CM

## 2022-01-11 DIAGNOSIS — S51012A Laceration without foreign body of left elbow, initial encounter: Secondary | ICD-10-CM | POA: Diagnosis not present

## 2022-01-11 DIAGNOSIS — I10 Essential (primary) hypertension: Secondary | ICD-10-CM | POA: Diagnosis not present

## 2022-01-11 DIAGNOSIS — M87051 Idiopathic aseptic necrosis of right femur: Secondary | ICD-10-CM | POA: Diagnosis not present

## 2022-01-11 DIAGNOSIS — M87052 Idiopathic aseptic necrosis of left femur: Secondary | ICD-10-CM

## 2022-01-11 DIAGNOSIS — R262 Difficulty in walking, not elsewhere classified: Secondary | ICD-10-CM | POA: Diagnosis not present

## 2022-01-11 LAB — BASIC METABOLIC PANEL
Anion gap: 6 (ref 5–15)
BUN: 14 mg/dL (ref 8–23)
CO2: 28 mmol/L (ref 22–32)
Calcium: 8.3 mg/dL — ABNORMAL LOW (ref 8.9–10.3)
Chloride: 102 mmol/L (ref 98–111)
Creatinine, Ser: 0.66 mg/dL (ref 0.61–1.24)
GFR, Estimated: >60 mL/min (ref >60)
Glucose, Bld: 100 mg/dL — ABNORMAL HIGH (ref 70–99)
Potassium: 3.8 mmol/L (ref 3.5–5.1)
Sodium: 136 mmol/L (ref 135–145)

## 2022-01-11 LAB — CBC
HCT: 34 % — ABNORMAL LOW (ref 39.0–52.0)
Hemoglobin: 11.3 g/dL — ABNORMAL LOW (ref 13.0–17.0)
MCH: 31.7 pg (ref 26.0–34.0)
MCHC: 33.2 g/dL (ref 30.0–36.0)
MCV: 95.2 fL (ref 80.0–100.0)
Platelets: 130 10*3/uL — ABNORMAL LOW (ref 150–400)
RBC: 3.57 MIL/uL — ABNORMAL LOW (ref 4.22–5.81)
RDW: 12.8 % (ref 11.5–15.5)
WBC: 27 10*3/uL — ABNORMAL HIGH (ref 4.0–10.5)
nRBC: 0 % (ref 0.0–0.2)

## 2022-01-11 LAB — ANA W/REFLEX IF POSITIVE: Anti Nuclear Antibody (ANA): NEGATIVE

## 2022-01-11 MED ORDER — FUROSEMIDE 20 MG PO TABS: 20.0000 mg | ORAL_TABLET | Freq: Every day | ORAL | 0 refills | Status: DC

## 2022-01-11 NOTE — TOC Transition Note (Signed)
Transition of Care [facility]) - CM/SW Discharge Note   Patient Details  Name: Jerry Gomez MRN: [id number] Date of Birth: 03/07/1931  [facility] of [facility] CM/SW Contact:  Geralynn Ochs, LCSW Phone Number: 01/11/2022, 2:04 PM   Clinical Narrative:   CSW met with patient to discuss discharge home, patient agreeable to home health. Home health set up with [facility]. No DME needs, patient has all DME. No other needs identified at this time.     Final next level of care: [place] Barriers to Discharge: Barriers Resolved   Patient Goals and CMS Choice Patient states their goals for this hospitalization and ongoing recovery are:: to get home CMS Medicare.gov Compare Post Acute Care list provided to:: Patient Choice offered to / list presented to : Patient  Discharge Placement                Patient to be transferred to facility by: Family Name of family member notified: Self Patient and family notified of of transfer: 01/11/22  Discharge Plan and Services                                     Social Determinants of Health (SDOH) Interventions     Readmission Risk Interventions No flowsheet data found.

## 2022-01-11 NOTE — Discharge Summary (Signed)
Physician Discharge Summary   Patient: Jerry Gomez MRN: [id number] DOB: 1931-05-08  Admit date:     01/10/2022  Discharge date: 01/11/22  Discharge Physician: Jerry Gomez   PCP: Jerry Anger, MD   Recommendations at discharge:   Follow-up with PCP in 1 week Orthopedic office will call you to set up an outpatient follow-up appointment as previously discussed   Discharge Diagnoses: Principal Problem:   Difficulty walking with leg weakness  Active Problems:   Essential hypertension   CLL (chronic lymphocytic leukemia) (HCC)   Avascular necrosis of bones of both hips with severe OA of bilateral hips    Bilateral lower extremity edema    Hospital Course: Jerry Gomez is a 88 y.o. male with medical history significant of HTN, HLD, spinal stenosis with difficulty walking, OSA who presented to ED with fall and BLE weakness. Pt got up off the commode he fell as he felt like his right leg gave out, did not hit his head or lose consciousness. Of note, he had a similar complaints after he fell a week ago and was seen in ED of which an MRI spine was done and results were discussed with neurosurgery, Dr. Ronnald Ramp who noted L4/L5 moderate spinal canal stenosis, possible impingement of right L4 nerve root, unchanged from April 2022, and didn't feel like the findings explained his symptoms from that visit. MRI brain and DVT of RLE was also done and both negative. In the ED, VSS, labs with WBC 35 (chronic from CLL), pelvic xray: changes of avascular necrosis in bilateral femoral heads with subchondral cystic change and flattening. Severe joint space narrowing of both hips, right greater than left. Bone on bone on right.  Orthopedics consulted, recommend outpatient follow-up.  PT OT recommend home health PT/OT.     Today, pt denies any new complaints, denies any chest pain, shortness of breath, abdominal pain, nausea/vomiting, fever/chills.  Denies any worsening weakness or pain in BLE or  back.  Discussed extensively with patient's son Jerry Gomez about discharge and follow-up plans    Assessment and Plan:  Difficulty walking with leg weakness Avascular necrosis of bones of both hips with severe OA of bilateral hips, worse on R hip Pelvic xray shows severe OA in bilateral hips, R>L and bilateral avascular necrosis (Likely this is contributing to symptoms as per orthopedics) Known spinal stenosis with possible impingement, not changed from April 2022 and neurosurgery did not feel contributing to symptoms Orthopedics consulted, plan for outpatient evaluation of hip arthroplasty by Dr. Juliann Mule [facility] negative TSH, B12 WNL PT/OT- recommend [facility] PT/OT  Bilateral lower extremity edema Chronic issue over a year, but has been noticeably worse over past few weeks around ankles after patient stopped taking Lasix by himself almost a year ago Negative Doppler of RLE about a week ago ECHO: EF 70 to 75%, no regional wall motion abnormality, diastolic parameters were normal Restart Lasix 20 mg daily Continue TED hose Follow-up with PCP  Essential hypertension Continue home Lasix, metoprolol Follow-up with PCP  CLL (chronic lymphocytic leukemia) Noted chronically elevated WBC, at baseline Stage 0 on observation followed by oncology, Dr. Julien Nordmann Outpatient follow-up    Consultants: Orthopedics Procedures performed: None Disposition: Home Diet recommendation:  Regular diet    DISCHARGE MEDICATION: Allergies as of 01/11/2022   No Known Allergies      Medication List     TAKE these medications    artificial tears ointment Place into both eyes nightly.   aspirin 81  MG EC tablet Take 81 mg by mouth daily. Swallow whole.   atorvastatin 20 MG tablet Commonly known as: LIPITOR Take 1 tablet (20 mg total) by mouth daily.   CENTRUM SILVER ULTRA MENS PO Take 1 tablet by mouth.   ICAPS AREDS 2 PO Take 1 tablet by mouth 2 (two) times daily.    Ensure Take 237 mLs by mouth.   furosemide 20 MG tablet Commonly known as: LASIX Take 1 tablet (20 mg total) by mouth daily.   metoprolol succinate 50 MG 24 hr tablet Commonly known as: TOPROL-XL TAKE 1 TABLET BY MOUTH DAILY WITH OR IMMEDIATELY FOLLOWING A MEAL.   Vitamin D 50 MCG (2000 UT) tablet Take 2,000 Units by mouth daily.        Follow-up Information     Mcarthur Rossetti, MD Follow up.   Specialty: Orthopedic Surgery Why: Office will call you for a follow up appointment. If you dont hear from them in a weeks time, please call the above phone no Contact information: [place] [address] [phone]         [facility] [place] Follow up.   Specialty: Home Health Services Why: Representative from [facility] will call you to schedule start of home health services. Contact information: [address] [place] [address] [phone]         [name], [name], MD Follow up.   Specialty: Internal Medicine Why: As scheduled Contact information: [place] [address] [phone]                 Discharge Exam: Jerry Gomez   01/10/22 0015  Weight: 61.2 kg   General: NAD  Cardiovascular: S1, S2 present Respiratory: CTAB Abdomen: Soft, nontender, nondistended, bowel sounds present Musculoskeletal: No bilateral pedal edema noted Skin: Normal Psychiatry: Normal mood  Neurology: Noted subjective BLE weakness, but noted normal strength.  No obvious focal neurologic deficit noted   Condition at discharge: stable  The results of significant diagnostics from this hospitalization (including imaging, microbiology, ancillary and laboratory) are listed below for reference.   Imaging Studies: DG Pelvis 1-2 Views  Result Date: 01/10/2022 CLINICAL DATA:  Fall, hip pain. EXAM: PELVIS - 1-2 VIEW COMPARISON:  None. FINDINGS: The bones are diffusely markedly osteopenic. There is no evidence for  acute fracture or dislocation. There are changes of avascular necrosis in the bilateral femoral heads with subchondral cystic change and flattening. There is severe joint space narrowing of both hips, right greater than left. There is bone-on-bone configuration on the right. Soft tissues are within normal limits. IMPRESSION: 1. No acute fracture or dislocation. 2. 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 3. Severe bilateral hip degenerative change, right greater than left. 4. Diffuse osteopenia. Electronically Signed   By: Ronney Asters M.D.   On: 01/10/2022 01:33   DG Elbow Complete Left  Result Date: 01/10/2022 CLINICAL DATA:  Recent fall with skin tear, initial encounter EXAM: LEFT ELBOW - COMPLETE 3+ VIEW COMPARISON:  None. FINDINGS: There is no evidence of fracture, dislocation, or joint effusion. There is no evidence of arthropathy or other focal bone abnormality. Soft tissues are unremarkable. IMPRESSION: No acute abnormality noted. Electronically Signed   By: Inez Catalina M.D.   On: 01/10/2022 03:12   MR BRAIN WO CONTRAST  Result Date: 01/02/2022 CLINICAL DATA:  Neuro deficit, acute, stroke suspected. Right greater than leg weakness. EXAM: MRI HEAD WITHOUT CONTRAST TECHNIQUE: Multiplanar, multiecho pulse sequences of  the brain and surrounding structures were obtained without intravenous contrast. COMPARISON:  None. FINDINGS: There is residual contrast material from today's earlier lumbar spine MRI. Brain: There is no evidence of an acute infarct, intracranial hemorrhage, mass, midline shift, or extra-axial fluid collection. T2 hyperintensities in the cerebral white matter bilaterally are nonspecific but compatible with mild-to-moderate chronic small vessel ischemic disease. There is moderate cerebral atrophy. Vascular: Major intracranial vascular flow voids are preserved. Skull and upper cervical spine: Unremarkable bone marrow signal para Sinuses/Orbits: Bilateral cataract  extraction. Mild mucosal thickening in the paranasal sinuses. Clear mastoid air cells. Other: None. IMPRESSION: 1. No acute intracranial abnormality. 2. Mild-to-moderate chronic small vessel ischemic disease and cerebral atrophy. Electronically Signed   By: Logan Bores M.D.   On: 01/02/2022 18:07   MR Lumbar Spine W Wo Contrast  Result Date: 01/02/2022 CLINICAL DATA:  Sudden onset bilateral leg weakness EXAM: MRI LUMBAR SPINE WITHOUT AND WITH CONTRAST TECHNIQUE: Multiplanar and multiecho pulse sequences of the lumbar spine were obtained without and with intravenous contrast. CONTRAST:  40mL GADAVIST GADOBUTROL 1 MMOL/ML IV SOLN COMPARISON:  Lumbar spine MRI 03/22/2021. FINDINGS: Segmentation: Standard; the lowest formed disc space is designated L5-S1. Alignment: There is grade 1 retrolisthesis of L2 on L3 and grade 1 anterolisthesis of L4 on L5, unchanged. There is slight dextrocurvature centered at L3-L4. Alignment is otherwise normal. Vertebrae: Vertebral body heights are preserved. Marrow signal is normal. There is no abnormal marrow edema or enhancement. Conus medullaris and cauda equina: Conus extends to the T12-L1 level. Conus and cauda equina appear normal. Paraspinal and other soft tissues: A lobulated T2 hyperintense lesion in the right kidney is unchanged, likely reflecting a cyst. The paraspinal soft tissues are unremarkable. Disc levels: There is multilevel disc desiccation and narrowing, most advanced at L3-L4 and L5-S1. There is multilevel facet arthropathy, most advanced at L4-L5. There is an associated small effusion on the left. Findings are not significantly changed T12-L1: No significant spinal canal or neural foraminal stenosis L1-L2: There is a mild disc bulge and left worse than right facet arthropathy resulting in mild left and no significant right neural foraminal stenosis and no significant spinal canal stenosis, unchanged. L2-L3: There is grade 1 retrolisthesis with a mild bulge and  mild bilateral facet arthropathy resulting in mild narrowing of the left subarticular zone and mild left and no significant right neural foraminal stenosis, not significantly changed. L3-L4: There is a mild disc bulge and mild bilateral facet arthropathy resulting in mild left and no significant right neural foraminal stenosis and no significant spinal canal stenosis, unchanged. L4-L5: There is grade 1 anterolisthesis with a mild bulge, ligamentum flavum thickening, and moderate bilateral facet arthropathy resulting in moderate spinal canal stenosis with crowding of the cauda equina nerve roots and subarticular zones with possible impingement of the traversing right L4 nerve root (7-32), and mild bilateral neural foraminal stenosis. Findings are unchanged. L5-S1: There is a mild disc bulge and left worse than right facet arthropathy resulting in narrowing of the bilateral subarticular zones, right more than left, and moderate right and no significant left neural foraminal stenosis. Extraforaminal disc material again contacts the exiting right L5 nerve root (9-5). Findings are unchanged. IMPRESSION: 1. Mild disc bulge, ligamentum flavum thickening, and bulky facet arthropathy at L4-L5 resulting in moderate spinal canal stenosis with crowding of the subarticular zones and cauda equina nerve roots with possible impingement of the traversing right L4 nerve root, and mild bilateral neural foraminal stenosis, unchanged since 03/22/2021. 2. Disc  bulge and bilateral facet arthropathy at L5-S1 resulting in narrowing of the bilateral subarticular zones, right worse than left, and contact of the exiting right L5 nerve root by extraforaminal disc material, also not significantly changed. 3. Mild degenerative changes at the remaining levels as detailed above without other significant spinal canal or neural foraminal stenosis, and no other evidence of nerve root impingement. Electronically Signed   By: Valetta Mole M.D.   On:  01/02/2022 15:13   DG Shoulder Left  Result Date: 01/10/2022 CLINICAL DATA:  Left shoulder pain. EXAM: LEFT SHOULDER - 2+ VIEW COMPARISON:  None. FINDINGS: There is no evidence of fracture or dislocation. There are mild degenerative changes of the acromioclavicular joint. Soft tissues are unremarkable. IMPRESSION: Negative. Electronically Signed   By: Ronney Asters M.D.   On: 01/10/2022 01:34   ECHOCARDIOGRAM COMPLETE  Result Date: 01/10/2022    ECHOCARDIOGRAM REPORT   Patient Name:   Jerry Gomez Date of Exam: 01/10/2022 Medical Rec #:  [id number]     Height:       66.0 in Accession #:    [id number]    Weight:       135.0 lb Date of Birth:  Jul 18, 1931      BSA:          1.692 m Patient Age:    47 years      BP:           104/47 mmHg Patient Gender: M             HR:           83 bpm. Exam Location:  Inpatient Procedure: 2D Echo Indications:    lower extremity edema  History:        Patient has no prior history of Echocardiogram examinations.                 Risk Factors:Sleep Apnea, Hypertension and Dyslipidemia.  Sonographer:    Johny Chess RDCS Referring Phys: [id number] Orma Flaming  Sonographer Comments: Image acquisition challenging due to respiratory motion. IMPRESSIONS  1. Left ventricular ejection fraction, by estimation, is 70 to 75%. The left ventricle has hyperdynamic function. The left ventricle has no regional wall motion abnormalities. Left ventricular diastolic parameters were normal.  2. Right ventricular systolic function is normal. The right ventricular size is normal.  3. Trivial mitral valve regurgitation.  4. Difficult to see AV due to poor acoustic windows. . Aortic valve regurgitation is not visualized. Aortic valve sclerosis is present, with no evidence of aortic valve stenosis.  5. The inferior vena cava is normal in size with greater than 50% respiratory variability, suggesting right atrial pressure of 3 mmHg. FINDINGS  Left Ventricle: Left ventricular ejection fraction, by  estimation, is 70 to 75%. The left ventricle has hyperdynamic function. The left ventricle has no regional wall motion abnormalities. The left ventricular internal cavity size was normal in size. There is no left ventricular hypertrophy. Left ventricular diastolic parameters were normal. Right Ventricle: The right ventricular size is normal. Right vetricular wall thickness was not assessed. Right ventricular systolic function is normal. Left Atrium: Left atrial size was normal in size. Right Atrium: Right atrial size was normal in size. Pericardium: There is no evidence of pericardial effusion. Mitral Valve: There is mild thickening of the mitral valve leaflet(s). Trivial mitral valve regurgitation. Tricuspid Valve: The tricuspid valve is normal in structure. Tricuspid valve regurgitation is trivial. Aortic Valve: Difficult to see AV due to poor acoustic windows.  Aortic valve regurgitation is not visualized. Aortic valve sclerosis is present, with no evidence of aortic valve stenosis. Pulmonic Valve: The pulmonic valve was not well visualized. Pulmonic valve regurgitation is not visualized. Aorta: The aortic root and ascending aorta are structurally normal, with no evidence of dilitation. Venous: The inferior vena cava is normal in size with greater than 50% respiratory variability, suggesting right atrial pressure of 3 mmHg. IAS/Shunts: No atrial level shunt detected by color flow Doppler.  LEFT VENTRICLE PLAX 2D LVIDd:         3.80 cm   Diastology LVIDs:         2.20 cm   LV e' medial:    9.25 cm/s LV PW:         0.80 cm   LV E/e' medial:  9.2 LV IVS:        0.80 cm   LV e' lateral:   9.90 cm/s LVOT diam:     1.60 cm   LV E/e' lateral: 8.6 LV SV:         48 LV SV Index:   28 LVOT Area:     2.01 cm  RIGHT VENTRICLE             IVC RV S prime:     12.40 cm/s  IVC diam: 1.50 cm TAPSE (M-mode): 2.8 cm LEFT ATRIUM             Index        RIGHT ATRIUM           Index LA diam:        3.20 cm 1.89 cm/m   RA Area:      11.90 cm LA Vol (A2C):   40.9 ml 24.17 ml/m  RA Volume:   24.80 ml  14.66 ml/m LA Vol (A4C):   49.9 ml 29.49 ml/m LA Biplane Vol: 45.3 ml 26.77 ml/m  AORTIC VALVE LVOT Vmax:   114.00 cm/s LVOT Vmean:  80.300 cm/s LVOT VTI:    0.239 m  AORTA Ao Root diam: 3.20 cm Ao Asc diam:  3.00 cm MITRAL VALVE MV Area (PHT): 3.03 cm     SHUNTS MV Decel Time: 250 msec     Systemic VTI:  0.24 m MV E velocity: 85.30 cm/s   Systemic Diam: 1.60 cm MV A velocity: 120.00 cm/s MV E/A ratio:  0.71 Dorris Carnes MD Electronically signed by Dorris Carnes MD Signature Date/Time: 01/10/2022/8:45:13 PM    Final    VAS [place] LOWER EXTREMITY VENOUS (DVT) (ONLY [facility] & [facility])  Result Date: 01/02/2022  Lower Venous DVT Study Patient Name:  Jerry Gomez  Date of Exam:   01/02/2022 Medical Rec #: [id number]      Accession #:    [id number] Date of Birth: [birth year]       Patient Gender: M Patient Age:   89 years Exam Location:  [facility] Procedure:      VAS [place] LOWER EXTREMITY VENOUS (DVT) Referring Phys: Thurmond Butts DIXON --------------------------------------------------------------------------------  Indications: Swelling.  Risk Factors: None identified. Limitations: Poor ultrasound/tissue interface. Comparison Study: No prior studies. Performing Technologist: Oliver Hum RVT  Examination Guidelines: A complete evaluation includes B-mode imaging, spectral Doppler, color Doppler, and power Doppler as needed of all accessible portions of each vessel. Bilateral testing is considered an integral part of a complete examination. Limited examinations for reoccurring indications may be performed as noted. The reflux portion of the exam is performed with the patient in reverse Trendelenburg.  +---------+---------------+---------+-----------+----------+--------------+  RIGHT     Compressibility Phasicity Spontaneity Properties Thrombus Aging  +---------+---------------+---------+-----------+----------+--------------+  CFV       Full            Yes       Yes                                     +---------+---------------+---------+-----------+----------+--------------+  SFJ       Full                                                             +---------+---------------+---------+-----------+----------+--------------+  FV Prox   Full                                                             +---------+---------------+---------+-----------+----------+--------------+  FV Mid    Full                                                             +---------+---------------+---------+-----------+----------+--------------+  FV Distal Full                                                             +---------+---------------+---------+-----------+----------+--------------+  PFV       Full                                                             +---------+---------------+---------+-----------+----------+--------------+  POP       Full            Yes       Yes                                    +---------+---------------+---------+-----------+----------+--------------+  PTV       Full                                                             +---------+---------------+---------+-----------+----------+--------------+  PERO      Full                                                             +---------+---------------+---------+-----------+----------+--------------+   +----+---------------+---------+-----------+----------+--------------+  LEFT Compressibility Phasicity Spontaneity Properties Thrombus Aging  +----+---------------+---------+-----------+----------+--------------+  CFV  Full            Yes       Yes                                    +----+---------------+---------+-----------+----------+--------------+    Summary: RIGHT: - There is no evidence of deep vein thrombosis in the lower extremity.  - No cystic structure found in the popliteal fossa.  LEFT: - No evidence of common femoral vein obstruction.  *See table(s) above for measurements and observations.  Electronically signed by Harold Barban MD on 01/02/2022 at 9:17:50 PM.    Final     Microbiology: Results for orders placed or performed during the hospital encounter of 01/10/22  Resp Panel by RT-PCR (Flu A&B, Covid) Nasopharyngeal Swab     Status: None   Collection Time: 01/10/22  2:08 AM   Specimen: Nasopharyngeal Swab; Nasopharyngeal(NP) swabs in vial transport medium  Result Value Ref Range Status   SARS Coronavirus 2 by RT PCR NEGATIVE NEGATIVE Final    Comment: (NOTE) SARS-CoV-2 target nucleic acids are NOT DETECTED.  The SARS-CoV-2 RNA is generally detectable in upper respiratory specimens during the acute phase of infection. The lowest concentration of SARS-CoV-2 viral copies this assay can detect is 138 copies/mL. A negative result does not preclude SARS-Cov-2 infection and should not be used as the sole basis for treatment or other patient management decisions. A negative result may occur with  improper specimen collection/handling, submission of specimen other than nasopharyngeal swab, presence of viral mutation(s) within the areas targeted by this assay, and inadequate number of viral copies(<138 copies/mL). A negative result must be combined with clinical observations, patient history, and epidemiological information. The expected result is Negative.  Fact Sheet for Patients:  EntrepreneurPulse.com.au  Fact Sheet for Healthcare Providers:  IncredibleEmployment.be  This test is no t yet approved or cleared by the [place] FDA and  has been authorized for detection and/or diagnosis of SARS-CoV-2 by FDA under an Emergency Use Authorization (EUA). This EUA will remain  in effect (meaning this test can be used) for the duration of the COVID-19 declaration under Section 564(b)(1) of the Act, 21 U.S.C.section 360bbb-3(b)(1), unless the authorization is terminated  or revoked sooner.       Influenza A by PCR NEGATIVE NEGATIVE Final    Influenza B by PCR NEGATIVE NEGATIVE Final    Comment: (NOTE) The Xpert Xpress SARS-CoV-2/FLU/RSV plus assay is intended as an aid in the diagnosis of influenza from Nasopharyngeal swab specimens and should not be used as a sole basis for treatment. Nasal washings and aspirates are unacceptable for Xpert Xpress SARS-CoV-2/FLU/RSV testing.  Fact Sheet for Patients: EntrepreneurPulse.com.au  Fact Sheet for Healthcare Providers: IncredibleEmployment.be  This test is not yet approved or cleared by the [place] FDA and has been authorized for detection and/or diagnosis of SARS-CoV-2 by FDA under an Emergency Use Authorization (EUA). This EUA will remain in effect (meaning this test can be used) for the duration of the COVID-19 declaration under Section 564(b)(1) of the Act, 21 U.S.C. section 360bbb-3(b)(1), unless the authorization is terminated or revoked.  Performed at [facility], [address] [place], [place] [address]     Labs: CBC: Recent Labs  Lab 01/10/22 0210 01/11/22 0208  WBC 35.3* 27.0*  NEUTROABS 4.5  --   HGB 12.6* 11.3*  HCT 38.4* 34.0*  MCV 94.1 95.2  PLT 156 550*   Basic Metabolic Panel: Recent Labs  Lab 01/10/22 0210 01/11/22 0208  NA 140 136  K 3.9 3.8  CL 101 102  CO2 31 28  GLUCOSE 98 100*  BUN 20 14  CREATININE 0.62 0.66  CALCIUM 8.9 8.3*   Liver Function Tests: Recent Labs  Lab 01/10/22 1444  AST 41  ALT 27  ALKPHOS 71  BILITOT 1.1  PROT 4.7*  ALBUMIN 2.9*   CBG: No results for input(s): GLUCAP in the last 168 hours.  Discharge time spent: less than 30 minutes.  Signed: Alma Friendly, MD [facility] 01/11/2022

## 2022-01-11 NOTE — Plan of Care (Signed)
Pt is alert oriented x 4, pt did wake up to use bathroom and was confused upon waking up. Pt reoriented. Pt has voided twice. PRN norco given x 1 with effective results.    Problem: Education: Goal: Knowledge of General Education information will improve Description: Including pain rating scale, medication(s)/side effects and non-pharmacologic comfort measures Outcome: Progressing   Problem: Health Behavior/Discharge Planning: Goal: Ability to manage health-related needs will improve Outcome: Progressing   Problem: Clinical Measurements: Goal: Ability to maintain clinical measurements within normal limits will improve Outcome: Progressing Goal: Will remain free from infection Outcome: Progressing Goal: Diagnostic test results will improve Outcome: Progressing Goal: Respiratory complications will improve Outcome: Progressing Goal: Cardiovascular complication will be avoided Outcome: Progressing   Problem: Coping: Goal: Level of anxiety will decrease Outcome: Progressing   Problem: Elimination: Goal: Will not experience complications related to bowel motility Outcome: Progressing Goal: Will not experience complications related to urinary retention Outcome: Progressing   Problem: Pain Managment: Goal: General experience of comfort will improve Outcome: Progressing   Problem: Safety: Goal: Ability to remain free from injury will improve Outcome: Progressing   Problem: Skin Integrity: Goal: Risk for impaired skin integrity will decrease Outcome: Progressing

## 2022-01-11 NOTE — Care Management Obs Status (Signed)
[facility] NOTIFICATION   Patient Details  Name: Jerry Gomez MRN: [id number] Date of Birth: 1931-08-31   Medicare Observation Status Notification Given:  Yes    Geralynn Ochs, LCSW 01/11/2022, 3:36 PM

## 2022-01-11 NOTE — Progress Notes (Signed)
Physical Therapy Treatment Patient Details Name: Jerry Gomez MRN: [id number] DOB: 02-19-1931 Today's Date: 01/11/2022   History of Present Illness Patient is a 88 y/o male who presents on 01/09/22 after a mechanical fall in the bathroom. X-ray of the pelvis shows degenerative changes of the bilateral femoral heads with likely bilateral AVN right greater than left. Recent admission to [facility] for fall end of January. PMH includes CLL, HTN.    PT Comments    Pt progressing well towards physical therapy goals. Pt reports his 2 falls have been while trying to get up from the toilet. Pt's son to install toilet riser with handles on 2 of the 4 toilets in their home when the pt gets home. Feel the pt would benefit from RW use all the time, and has a rollator if he is going somewhere where he will need a seated rest break. Pt in agreement to use the RW as much as possible and only use the rollator if needed for breaks in the community. He practiced with both and was safe with both, however with better posture and overall stability with the RW. Pt and wife agreeable to HHPT services, which may be better initially than outpatient PT so the therapist can do a safety screen in the home and identify other potential fall risks. Will continue to follow and progress as able per POC.      Recommendations for follow up therapy are one component of a multi-disciplinary discharge planning process, led by the attending physician.  Recommendations may be updated based on patient status, additional functional criteria and insurance authorization.  Follow Up Recommendations  Home health PT     Assistance Recommended at Discharge Frequent or constant Supervision/Assistance  Patient can return home with the following A little help with walking and/or transfers;A little help with bathing/dressing/bathroom;Assist for transportation;Help with stairs or ramp for entrance;Assistance with cooking/housework   Equipment  Recommendations  None recommended by PT    Recommendations for Other Services       Precautions / Restrictions Precautions Precautions: Fall Precaution Comments: AVN bilaterally Restrictions Weight Bearing Restrictions: No     Mobility  Bed Mobility Overal bed mobility: Needs Assistance Bed Mobility: Supine to Sit     Supine to sit: Supervision     General bed mobility comments: HOB elevated and min use of rails required. No assist provided    Transfers Overall transfer level: Needs assistance Equipment used: Rolling walker (2 wheels) Transfers: Sit to/from Stand Sit to Stand: Min guard           General transfer comment: Close guard for safety as pt powered up to full standing position. VC's for hand placement on seated surface for safety.    Ambulation/Gait Ambulation/Gait assistance: Min guard Gait Distance (Feet): 200 Feet Assistive device: Rolling walker (2 wheels) Gait Pattern/deviations: Step-to pattern, Decreased stance time - left, Decreased step length - right Gait velocity: decreased Gait velocity interpretation: <1.31 ft/sec, indicative of household ambulator   General Gait Details: Slow, guarded step-to gait with decreased heel strike on LLE during initial contact, crepitus noted bil hips. Increased discomfort with attempts at step-through on the RLE.   Stairs Stairs: Yes Stairs assistance: Min guard Stair Management: One rail Left, Step to pattern, Forwards Number of Stairs: 5 General stair comments: VC's for safety awareness and sequencing. Pt was able to negotiate the practice steps in the rehab gym without difficulty.   Wheelchair Mobility    Modified Rankin (Stroke Patients Only)  Balance Overall balance assessment: Needs assistance Sitting-balance support: No upper extremity supported, Feet supported Sitting balance-Leahy Scale: Fair     Standing balance support: During functional activity, Reliant on assistive device for  balance Standing balance-Leahy Scale: Poor                              Cognition Arousal/Alertness: Awake/alert Behavior During Therapy: WFL for tasks assessed/performed Overall Cognitive Status: Within Functional Limits for tasks assessed                                          Exercises      General Comments        Pertinent Vitals/Pain Pain Assessment Pain Assessment: Faces Faces Pain Scale: Hurts little more Pain Location: BLEs, left>right, LUE Pain Descriptors / Indicators: Sore, Discomfort Pain Intervention(s): Limited activity within patient's tolerance, Monitored during session, Repositioned    Home Living                          Prior Function            PT Goals (current goals can now be found in the care plan section) Acute Rehab PT Goals Patient Stated Goal: to stop falling, less pain PT Goal Formulation: With patient/family Time For Goal Achievement: 01/24/22 Potential to Achieve Goals: Good Progress towards PT goals: Progressing toward goals    Frequency    Min 3X/week      PT Plan Discharge plan needs to be updated    Co-evaluation              AM-PAC PT "6 Clicks" Mobility   Outcome Measure  Help needed turning from your back to your side while in a flat bed without using bedrails?: A Little Help needed moving from lying on your back to sitting on the side of a flat bed without using bedrails?: A Little Help needed moving to and from a bed to a chair (including a wheelchair)?: A Little Help needed standing up from a chair using your arms (e.g., wheelchair or bedside chair)?: A Little Help needed to walk in hospital room?: A Little Help needed climbing 3-5 steps with a railing? : A Little 6 Click Score: 18    End of Session Equipment Utilized During Treatment: Gait belt Activity Tolerance: Patient tolerated treatment well;Patient limited by pain Patient left: in chair;with call bell/phone  within reach;with chair alarm set;with family/visitor present Nurse Communication: Mobility status PT Visit Diagnosis: Pain;History of falling (Z91.81);Other abnormalities of gait and mobility (R26.89);Unsteadiness on feet (R26.81) Pain - Right/Left: Left (bil) Pain - part of body: Hip;Arm     Time: 0076-2263 PT Time Calculation (min) (ACUTE ONLY): 39 min  Charges:  $Gait Training: 38-52 mins                     Rolinda Roan, PT, DPT Acute Rehabilitation Services Pager: [phone] Office: [phone]    Thelma Comp 01/11/2022, 11:22 AM

## 2022-01-11 NOTE — Consult Note (Signed)
ORTHOPAEDIC CONSULTATION  REQUESTING PHYSICIAN: Alma Friendly, MD  Chief Complaint: Gait changes with recent falls  HPI: Jerry Gomez is a 88 y.o. male who presents with 2 falls in the last 2 weeks with no obvious cause.  Currently admitted to the hospital for work-up of orthostasis.  Orthopedics was consulted as x-ray shows bilateral end-stage osteoarthritis although worse on the right.  He states that his mobility has been quite poor over the last several years.  He did not seek any additional evaluation because he really does not have very much pain.  His mobility has been quite limited.  He enjoyed golfing several rounds a week prior to [place] although this is recently stopped due to his mobility limitations  Past Medical History:  Diagnosis Date   Cancer ([place])    CLL   Cataract    COLONIC POLYPS 03/02/2009   EYE SURGERY, HX OF 09/05/2007   HEMORRHOIDS, HX OF    HYPERLIPIDEMIA 09/05/2007   HYPERTENSION 07/25/2007   OSTEOARTHRITIS 09/05/2007   SLEEP APNEA, OBSTRUCTIVE 09/05/2007   Past Surgical History:  Procedure Laterality Date   EYE SURGERY  10/22/13   left eye, cataract   Social History   Socioeconomic History   Marital status: Married    Spouse name: Not on file   Number of children: 2   Years of education: 13   Highest education level: Not on file  Occupational History   Occupation: retired  Tobacco Use   Smoking status: Former    Types: Cigarettes    Quit date: 02/07/1963    Years since quitting: 58.9   Smokeless tobacco: Never  Vaping Use   Vaping Use: Never used  Substance and Sexual Activity   Alcohol use: Yes    Alcohol/week: 14.0 standard drinks    Types: 14 Standard drinks or equivalent per week    Comment: wine   Drug use: No   Sexual activity: Yes    Partners: Female  Other Topics Concern   Not on file  Social History Narrative   HS[employer], [place]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 Married '54. 2 sons ' 63, '67. 2 grandchildren.   Work -  retired '07. [employer] - son is second 50. CPR- yes, short-term mechanical ventilation, no prolonged heroic or futile measures.    Social Determinants of Health   Financial Resource Strain: Low Risk    Difficulty of Paying Living Expenses: Not hard at all  Food Insecurity: No Food Insecurity   Worried About Charity fundraiser in the Last Year: Never true   [place] in the Last Year: Never true  Transportation Needs: No Transportation Needs   Lack of Transportation (Medical): No   Lack of Transportation (Non-Medical): No  Physical Activity: Sufficiently Active   Days of Exercise per Week: 5 days   Minutes of Exercise per Session: 30 min  Stress: No Stress Concern Present   Feeling of Stress : Not at all  Social Connections: Socially Integrated   Frequency of Communication with Friends and Family: More than three times a week   Frequency of Social Gatherings with Friends and Family: More than three times a week   Attends Religious Services: 1 to 4 times per year   Active Member of [employer] or Organizations: Yes   Attends Archivist Meetings: 1 to 4 times per year   Marital Status: Married   Family History  Problem Relation Age of Onset   Kidney disease Father  Cancer Father        prostate-late on-set   Cancer Brother        non-hodgkins lymphoma   Colon cancer Neg Hx    Esophageal cancer Neg Hx    Rectal cancer Neg Hx    Stomach cancer Neg Hx    - negative except otherwise stated in the family history section No Known Allergies Prior to Admission medications   Medication Sig Start Date End Date Taking? Authorizing Provider  aspirin 81 MG EC tablet Take 81 mg by mouth daily. Swallow whole.   Yes [provider]  atorvastatin (LIPITOR) 20 MG tablet Take 1 tablet (20 mg total) by mouth daily. 10/17/21  Yes [name], [name], MD  Cholecalciferol (VITAMIN D) 2000 units tablet Take 2,000 Units by mouth daily.   Yes [provider]  Ensure  (ENSURE) Take 237 mLs by mouth.   Yes [provider]  furosemide (LASIX) 20 MG tablet Take 1 tablet (20 mg total) by mouth daily. 10/17/21  Yes [name], [name], MD  metoprolol succinate (TOPROL-XL) 50 MG 24 hr tablet TAKE 1 TABLET BY MOUTH DAILY WITH OR IMMEDIATELY FOLLOWING A MEAL. 10/17/21  Yes [name], [name], MD  Multiple Vitamins-Minerals (CENTRUM SILVER ULTRA MENS PO) Take 1 tablet by mouth.   Yes [provider]  Multiple Vitamins-Minerals (ICAPS AREDS 2 PO) Take 1 tablet by mouth 2 (two) times daily.   Yes [provider]  Artificial Tear Ointment (ARTIFICIAL TEARS) ointment Place into both eyes nightly.    [provider]   DG Pelvis 1-2 Views  Result Date: 01/10/2022 CLINICAL DATA:  Fall, hip pain. EXAM: PELVIS - 1-2 VIEW COMPARISON:  None. FINDINGS: The bones are diffusely markedly osteopenic. There is no evidence for acute fracture or dislocation. There are changes of avascular necrosis in the bilateral femoral heads with subchondral cystic change and flattening. There is severe joint space narrowing of both hips, right greater than left. There is bone-on-bone configuration on the right. Soft tissues are within normal limits. IMPRESSION: 1. No acute fracture or dislocation. 2. 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 3. Severe bilateral hip degenerative change, right greater than left. 4. Diffuse osteopenia. Electronically Signed   By: Ronney Asters M.D.   On: 01/10/2022 01:33   DG Elbow Complete Left  Result Date: 01/10/2022 CLINICAL DATA:  Recent fall with skin tear, initial encounter EXAM: LEFT ELBOW - COMPLETE 3+ VIEW COMPARISON:  None. FINDINGS: There is no evidence of fracture, dislocation, or joint effusion. There is no evidence of arthropathy or other focal bone abnormality. Soft tissues are unremarkable. IMPRESSION: No acute abnormality noted. Electronically Signed   By: Inez Catalina M.D.   On: 01/10/2022 03:12   DG  Shoulder Left  Result Date: 01/10/2022 CLINICAL DATA:  Left shoulder pain. EXAM: LEFT SHOULDER - 2+ VIEW COMPARISON:  None. FINDINGS: There is no evidence of fracture or dislocation. There are mild degenerative changes of the acromioclavicular joint. Soft tissues are unremarkable. IMPRESSION: Negative. Electronically Signed   By: Ronney Asters M.D.   On: 01/10/2022 01:34   ECHOCARDIOGRAM COMPLETE  Result Date: 01/10/2022    ECHOCARDIOGRAM REPORT   Patient Name:   DEVARIUS NELLES Date of Exam: 01/10/2022 Medical Rec #:  [id number]     Height:       66.0 in Accession #:    [id number]    Weight:       135.0 lb Date of Birth:  [birth year]  BSA:          1.692 m Patient Age:    44 years      BP:           104/47 mmHg Patient Gender: M             HR:           83 bpm. Exam Location:  Inpatient Procedure: 2D Echo Indications:    lower extremity edema  History:        Patient has no prior history of Echocardiogram examinations.                 Risk Factors:Sleep Apnea, Hypertension and Dyslipidemia.  Sonographer:    Johny Chess RDCS Referring Phys: [id number] Orma Flaming  Sonographer Comments: Image acquisition challenging due to respiratory motion. IMPRESSIONS  1. Left ventricular ejection fraction, by estimation, is 70 to 75%. The left ventricle has hyperdynamic function. The left ventricle has no regional wall motion abnormalities. Left ventricular diastolic parameters were normal.  2. Right ventricular systolic function is normal. The right ventricular size is normal.  3. Trivial mitral valve regurgitation.  4. Difficult to see AV due to poor acoustic windows. . Aortic valve regurgitation is not visualized. Aortic valve sclerosis is present, with no evidence of aortic valve stenosis.  5. The inferior vena cava is normal in size with greater than 50% respiratory variability, suggesting right atrial pressure of 3 mmHg. FINDINGS  Left Ventricle: Left ventricular ejection fraction, by estimation, is 70 to 75%. The  left ventricle has hyperdynamic function. The left ventricle has no regional wall motion abnormalities. The left ventricular internal cavity size was normal in size. There is no left ventricular hypertrophy. Left ventricular diastolic parameters were normal. Right Ventricle: The right ventricular size is normal. Right vetricular wall thickness was not assessed. Right ventricular systolic function is normal. Left Atrium: Left atrial size was normal in size. Right Atrium: Right atrial size was normal in size. Pericardium: There is no evidence of pericardial effusion. Mitral Valve: There is mild thickening of the mitral valve leaflet(s). Trivial mitral valve regurgitation. Tricuspid Valve: The tricuspid valve is normal in structure. Tricuspid valve regurgitation is trivial. Aortic Valve: Difficult to see AV due to poor acoustic windows. Aortic valve regurgitation is not visualized. Aortic valve sclerosis is present, with no evidence of aortic valve stenosis. Pulmonic Valve: The pulmonic valve was not well visualized. Pulmonic valve regurgitation is not visualized. Aorta: The aortic root and ascending aorta are structurally normal, with no evidence of dilitation. Venous: The inferior vena cava is normal in size with greater than 50% respiratory variability, suggesting right atrial pressure of 3 mmHg. IAS/Shunts: No atrial level shunt detected by color flow Doppler.  LEFT VENTRICLE PLAX 2D LVIDd:         3.80 cm   Diastology LVIDs:         2.20 cm   LV e' medial:    9.25 cm/s LV PW:         0.80 cm   LV E/e' medial:  9.2 LV IVS:        0.80 cm   LV e' lateral:   9.90 cm/s LVOT diam:     1.60 cm   LV E/e' lateral: 8.6 LV SV:         48 LV SV Index:   28 LVOT Area:     2.01 cm  RIGHT VENTRICLE  IVC RV S prime:     12.40 cm/s  IVC diam: 1.50 cm TAPSE (M-mode): 2.8 cm LEFT ATRIUM             Index        RIGHT ATRIUM           Index LA diam:        3.20 cm 1.89 cm/m   RA Area:     11.90 cm LA Vol (A2C):   40.9  ml 24.17 ml/m  RA Volume:   24.80 ml  14.66 ml/m LA Vol (A4C):   49.9 ml 29.49 ml/m LA Biplane Vol: 45.3 ml 26.77 ml/m  AORTIC VALVE LVOT Vmax:   114.00 cm/s LVOT Vmean:  80.300 cm/s LVOT VTI:    0.239 m  AORTA Ao Root diam: 3.20 cm Ao Asc diam:  3.00 cm MITRAL VALVE MV Area (PHT): 3.03 cm     SHUNTS MV Decel Time: 250 msec     Systemic VTI:  0.24 m MV E velocity: 85.30 cm/s   Systemic Diam: 1.60 cm MV A velocity: 120.00 cm/s MV E/A ratio:  0.71 Dorris Carnes MD Electronically signed by Dorris Carnes MD Signature Date/Time: 01/10/2022/8:45:13 PM    Final      Positive ROS: All other systems have been reviewed and were otherwise negative with the exception of those mentioned in the HPI and as above.  Physical Exam: General: No acute distress Cardiovascular: No pedal edema Respiratory: No cyanosis, no use of accessory musculature GI: No organomegaly, abdomen is soft and non-tender Skin: No lesions in the area of chief complaint Neurologic: Sensation intact distally Psychiatric: Patient is at baseline mood and affect Lymphatic: No axillary or cervical lymphadenopathy  MUSCULOSKELETAL:  Leg lengths are equal.  Neurosensory exam is equal and intact in bilateral legs.  No pain with logroll about either hip.  Independent Imaging Review: X-ray AP pelvis and bilateral hips 3 views: End-stage osteoarthritis of bilateral hips worse on the right  Assessment: Very pleasant 88 year old male with bilateral severe osteoarthritis of the hips.  Overall I did describe that I believe that he would be a candidate for arthroplasty particularly on this right side as his mobility is quite limited I do believe that this is likely contributing to his falls.  Given the fact that he is overall relatively healthy and would like to stay active and activities such as golf, I will plan to refer him to my partner Dr. Zollie Beckers for outpatient evaluation of possible hip arthroplasty  Plan: Plan for outpatient evaluation of  hip arthroplasty  Thank you for the consult and the opportunity to see Mr. Tramond Slinker, MD [facility] 7:49 AM

## 2022-01-12 NOTE — Progress Notes (Signed)
Patient ready for discharge to home; discharge instructions given; no change to medications per AVS; patient assisted to dress and discharged out via wheelchair; accompanied home by wife and son.

## 2022-01-13 ENCOUNTER — Encounter (HOSPITAL_BASED_OUTPATIENT_CLINIC_OR_DEPARTMENT_OTHER): Payer: Self-pay | Admitting: Emergency Medicine

## 2022-01-13 ENCOUNTER — Emergency Department (HOSPITAL_BASED_OUTPATIENT_CLINIC_OR_DEPARTMENT_OTHER)
Admission: EM | Admit: 2022-01-13 | Discharge: 2022-01-13 | Disposition: A | Discharge status: Home / Self Care | Payer: Medicare Other | Attending: Emergency Medicine | Admitting: Emergency Medicine

## 2022-01-13 ENCOUNTER — Other Ambulatory Visit: Payer: Self-pay

## 2022-01-13 DIAGNOSIS — Z7982 Long term (current) use of aspirin: Secondary | ICD-10-CM | POA: Insufficient documentation

## 2022-01-13 DIAGNOSIS — S41102D Unspecified open wound of left upper arm, subsequent encounter: Secondary | ICD-10-CM | POA: Insufficient documentation

## 2022-01-13 DIAGNOSIS — Z4800 Encounter for change or removal of nonsurgical wound dressing: Secondary | ICD-10-CM | POA: Insufficient documentation

## 2022-01-13 DIAGNOSIS — Z4801 Encounter for change or removal of surgical wound dressing: Secondary | ICD-10-CM | POA: Diagnosis not present

## 2022-01-13 DIAGNOSIS — W19XXXD Unspecified fall, subsequent encounter: Secondary | ICD-10-CM | POA: Diagnosis not present

## 2022-01-13 DIAGNOSIS — Z79899 Other long term (current) drug therapy: Secondary | ICD-10-CM | POA: Insufficient documentation

## 2022-01-13 NOTE — ED Triage Notes (Signed)
Pt fell on 01/10/22 skin tears to left arm, seen here for same and transfer to [facility]. Pt discharged home with very little instruction for wound care and bandage has not been changed since. Here for recheck of wound and care instructions.

## 2022-01-13 NOTE — Discharge Instructions (Signed)
You are taking care of the wounds on your left arm quite nicely.  Given that these are skin tears/abrasions versus a simple cut, I recommend using ointment over the wounds for the next 5 to 7 days when you change your bandage to prevent the bandage from sticking in the wound and ripping off scabs during every bandage change.  You can simply clean the area with soap and water once or twice a day, pat dry and then apply Neosporin over the area.  Then apply a nonadherent dressing and wrap with tape.

## 2022-01-13 NOTE — ED Provider Notes (Signed)
[facility] DEPT Provider Note   CSN: [id number] Arrival date & time: 01/13/22  1334     History  Chief Complaint  Patient presents with   Wound Check   Dressing Change    Jerry Gomez is a 88 y.o. male accompanied by his wife for a wound recheck on his left arm following admission at the hospital for fall on 01/10/2022.  Patient had bandages placed then, but wife feels that she did have evident instruction to know how to change and care for the wounds on her own at home.  They come here for bandage change and 2 large open wound care.  Patient is otherwise doing fine, denies signs of infection fever.   Wound Check Pertinent negatives include no abdominal pain, no headaches and no shortness of breath.      Home Medications Prior to Admission medications   Medication Sig Start Date End Date Taking? Authorizing Provider  Artificial Tear Ointment (ARTIFICIAL TEARS) ointment Place into both eyes nightly.    [provider]  aspirin 81 MG EC tablet Take 81 mg by mouth daily. Swallow whole.    [provider]  atorvastatin (LIPITOR) 20 MG tablet Take 1 tablet (20 mg total) by mouth daily. 10/17/21   [name], [name], MD  Cholecalciferol (VITAMIN D) 2000 units tablet Take 2,000 Units by mouth daily.    [provider]  Ensure (ENSURE) Take 237 mLs by mouth.    [provider]  furosemide (LASIX) 20 MG tablet Take 1 tablet (20 mg total) by mouth daily. 01/11/22 02/10/22  Alma Friendly, MD  metoprolol succinate (TOPROL-XL) 50 MG 24 hr tablet TAKE 1 TABLET BY MOUTH DAILY WITH OR IMMEDIATELY FOLLOWING A MEAL. 10/17/21   [name], [name], MD  Multiple Vitamins-Minerals (CENTRUM SILVER ULTRA MENS PO) Take 1 tablet by mouth.    [provider]  Multiple Vitamins-Minerals (ICAPS AREDS 2 PO) Take 1 tablet by mouth 2 (two) times daily.    [provider]      Allergies    Patient has no known allergies.     Review of Systems   Review of Systems  Constitutional:  Negative for fever.  HENT: Negative.    Eyes: Negative.   Respiratory:  Negative for shortness of breath.   Cardiovascular: Negative.   Gastrointestinal:  Negative for abdominal pain and vomiting.  Endocrine: Negative.   Genitourinary: Negative.   Musculoskeletal: Negative.   Skin:  Positive for wound. Negative for rash.  Neurological:  Negative for headaches.  All other systems reviewed and are negative.  Physical Exam Updated Vital Signs BP (!) 110/57 (BP Location: Right Arm)    Pulse 64    Temp 98.1 F (36.7 C) (Oral)    Resp 16    Ht 5\' 6"  (1.676 m)    Wt 61.2 kg    SpO2 100%    BMI 21.78 kg/m  Physical Exam Vitals and nursing note reviewed.  Constitutional:      General: He is not in acute distress.    Appearance: He is not ill-appearing.  HENT:     Head: Atraumatic.  Eyes:     Conjunctiva/sclera: Conjunctivae normal.  Cardiovascular:     Rate and Rhythm: Normal rate and regular rhythm.     Pulses: Normal pulses.     Heart sounds: No murmur heard. Pulmonary:     Effort: Pulmonary effort is normal. No respiratory distress.     Breath sounds: Normal breath sounds.  Abdominal:     General: Abdomen is flat. There is no distension.     Palpations: Abdomen is soft.     Tenderness: There is no abdominal tenderness.  Musculoskeletal:        General: Normal range of motion.     Cervical back: Normal range of motion.  Skin:    General: Skin is warm and dry.     Capillary Refill: Capillary refill takes less than 2 seconds.     Comments: He has 3 areas on his left arm, 2 upper and lower than his forearm where his skin was avulsed.  No sutures were applied.  The area is nonerythematous, nonpurulent, healing well.  Neurological:     General: No focal deficit present.     Mental Status: He is alert.  Psychiatric:        Mood and Affect: Mood normal.    ED Results / Procedures / Treatments   Labs (all labs  ordered are listed, but only abnormal results are displayed) Labs Reviewed - No data to display  EKG None  Radiology No results found.  Procedures Procedures    Medications Ordered in ED Medications - No data to display  ED Course/ Medical Decision Making/ A&P                           Medical Decision Making  History:  Per HPI  Initial impression:  This patient presents to the ED for concern of wound recheck, this involves an extensive number of treatment o wound recheck ptions, and is a complaint that carries with it a high risk of complications and morbidity.     ED Course: This is an 88 year old male in no acute distress, vitals normal, nontoxic-appearing.  After removal of the bandages placed 2 days ago, the wound on his left upper arm and left forearm are healing well without signs of infection.  However, nonadherent dressing was placed on the large areas of open wound, so upon removal of the bandage, the scabs were ripped off to reinstated bleeding.  Bandages were were changed, wound was clean.  Instruction was provided to patient's wife and wound care.  We also discussed using an ointment such as Neosporin for the next 5 to 7 days to prevent the bandage from adhering to the wound.    Disposition:  After consideration of the diagnostic results, physical exam, history and the patients response to treatment feel that the patent would benefit from discharge with outpatient follow-up.   Wound recheck: All questions were asked and answered.  Patient discharged home in good condition.  He is to follow-up with his primary care doctor for further needs.   Final Clinical Impression(s) / ED Diagnoses Final diagnoses:  Dressing change or removal, nonsurgical wound    Rx / DC Orders ED Discharge Orders     None         Tonye Pearson, [place] 01/13/22 1630    Wyvonnia Dusky, MD 01/13/22 1744

## 2022-01-14 LAB — PATHOLOGIST SMEAR REVIEW

## 2022-01-15 DIAGNOSIS — M160 Bilateral primary osteoarthritis of hip: Secondary | ICD-10-CM | POA: Diagnosis not present

## 2022-01-15 DIAGNOSIS — G4733 Obstructive sleep apnea (adult) (pediatric): Secondary | ICD-10-CM | POA: Diagnosis not present

## 2022-01-15 DIAGNOSIS — C9110 Chronic lymphocytic leukemia of B-cell type not having achieved remission: Secondary | ICD-10-CM | POA: Diagnosis not present

## 2022-01-15 DIAGNOSIS — E785 Hyperlipidemia, unspecified: Secondary | ICD-10-CM | POA: Diagnosis not present

## 2022-01-15 DIAGNOSIS — Z9181 History of falling: Secondary | ICD-10-CM | POA: Diagnosis not present

## 2022-01-15 DIAGNOSIS — M87852 Other osteonecrosis, left femur: Secondary | ICD-10-CM | POA: Diagnosis not present

## 2022-01-15 DIAGNOSIS — I10 Essential (primary) hypertension: Secondary | ICD-10-CM | POA: Diagnosis not present

## 2022-01-15 DIAGNOSIS — Z87891 Personal history of nicotine dependence: Secondary | ICD-10-CM | POA: Diagnosis not present

## 2022-01-15 DIAGNOSIS — G3184 Mild cognitive impairment, so stated: Secondary | ICD-10-CM | POA: Diagnosis not present

## 2022-01-15 DIAGNOSIS — M4800 Spinal stenosis, site unspecified: Secondary | ICD-10-CM | POA: Diagnosis not present

## 2022-01-15 DIAGNOSIS — Z86010 Personal history of colonic polyps: Secondary | ICD-10-CM | POA: Diagnosis not present

## 2022-01-15 DIAGNOSIS — S41112D Laceration without foreign body of left upper arm, subsequent encounter: Secondary | ICD-10-CM | POA: Diagnosis not present

## 2022-01-15 DIAGNOSIS — K649 Unspecified hemorrhoids: Secondary | ICD-10-CM | POA: Diagnosis not present

## 2022-01-15 DIAGNOSIS — Z7982 Long term (current) use of aspirin: Secondary | ICD-10-CM | POA: Diagnosis not present

## 2022-01-15 DIAGNOSIS — M87851 Other osteonecrosis, right femur: Secondary | ICD-10-CM | POA: Diagnosis not present

## 2022-01-16 DIAGNOSIS — M4800 Spinal stenosis, site unspecified: Secondary | ICD-10-CM | POA: Diagnosis not present

## 2022-01-16 DIAGNOSIS — M87852 Other osteonecrosis, left femur: Secondary | ICD-10-CM | POA: Diagnosis not present

## 2022-01-16 DIAGNOSIS — M160 Bilateral primary osteoarthritis of hip: Secondary | ICD-10-CM | POA: Diagnosis not present

## 2022-01-16 DIAGNOSIS — M87851 Other osteonecrosis, right femur: Secondary | ICD-10-CM | POA: Diagnosis not present

## 2022-01-16 DIAGNOSIS — G3184 Mild cognitive impairment, so stated: Secondary | ICD-10-CM | POA: Diagnosis not present

## 2022-01-16 DIAGNOSIS — I10 Essential (primary) hypertension: Secondary | ICD-10-CM | POA: Diagnosis not present

## 2022-01-18 ENCOUNTER — Ambulatory Visit (INDEPENDENT_AMBULATORY_CARE_PROVIDER_SITE_OTHER): Payer: Medicare Other | Admitting: Internal Medicine

## 2022-01-18 ENCOUNTER — Other Ambulatory Visit: Payer: Self-pay

## 2022-01-18 ENCOUNTER — Encounter: Payer: Self-pay | Admitting: Internal Medicine

## 2022-01-18 DIAGNOSIS — W19XXXD Unspecified fall, subsequent encounter: Secondary | ICD-10-CM | POA: Diagnosis not present

## 2022-01-18 DIAGNOSIS — I951 Orthostatic hypotension: Secondary | ICD-10-CM

## 2022-01-18 DIAGNOSIS — I10 Essential (primary) hypertension: Secondary | ICD-10-CM

## 2022-01-18 MED ORDER — METOPROLOL SUCCINATE ER 50 MG PO TB24: 25.0000 mg | ORAL_TABLET | Freq: Every day | ORAL | 1 refills | Status: DC

## 2022-01-18 NOTE — Assessment & Plan Note (Signed)
Reduce Metoprolol dose in 1/2, furosemide prn

## 2022-01-18 NOTE — Assessment & Plan Note (Signed)
Reduce Metoprolol dose in 1/2, furosemide prn Stand up slow

## 2022-01-18 NOTE — Progress Notes (Signed)
Subjective:  Patient ID: Jerry Gomez, male    DOB: 03/10/1931  Age: 88 y.o. MRN: [id number]  CC: No chief complaint on file.   HPI TREVARIS PENNELLA presents for weakness, edema.  He is here with his son and with his wife.  They are helping with history.  On 01/02/22: "Very pleasant 88 year old male presenting for acute onset of bilateral leg weakness that is greater on the right.  This occurred shortly prior to arrival when he attempted to stand up off of the toilet.  On arrival, patient is well-appearing.  He has pitting edema to his bilateral lower extremities that is greater on the right.  He states that this has not changed in the past several months.  He is not currently on any blood thinning medications.  DVT study of right lower extremity was ordered.  On neurologic exam, patient has no areas of diminished sensation.  He does have diminished strength in bilateral lower extremities with knee extension.  He has proximal leg  weakness that is greater on the right than the left.  Distal BLE strength is intact.  Patient does have a history of lumbar stenoses and documented bilateral leg weakness in the past.  It appears that the symptoms have acutely worsened today.  An MRI of his lumbar spine was ordered to assess for acute changes.  Currently, patient has no discomfort at rest.  No medications for analgesia are indicated.  Patient underwent MRI of the lumbar spine.  On his return to the ED, strength was reevaluated.  Patient was able to move to edge of bed unassisted.  He was able to stand without assistance.  When standing, he continued to endorse weakness in his right leg.  He does, however, state that his motor function has improved since the episode at home.  Given concern for possible small stroke, MRI brain was ordered.  This, along with laboratory work-up were pending at time of signout.  Care of patient was signed out to oncoming ED provider."  01/02/22 "This is an 88 year old male with a  history of CLL, hypertension and hyperlipidemia who presents following a fall.  Patient reports that he stood up from the toilet and his legs "gave out on him."  He fell to the floor.  He denies any loss of consciousness.  He denies syncope.  He states that he had a similar incident 1 week ago and was seen and evaluated at [facility].  At that time he felt that his right leg was more weak than his left.  Patient denies hitting his head.  At this time he denies any pain.  He has not had any recent illnesses.  Patient states that he is healthy and lives independently with his wife at home.   Chart reviewed.  Patient had basic lab work and MRI head and lumbar spine.  MRI was negative for acute stroke.  Lumbar spine showed some right-sided nerve compression no frank cord edema or injury.  He has primary care office visit notes that indicate generalized weakness and difficulty ambulating with gait disturbance as far back as November."  Outpatient Medications Prior to Visit  Medication Sig Dispense Refill   Artificial Tear Ointment (ARTIFICIAL TEARS) ointment Place into both eyes nightly.     aspirin 81 MG EC tablet Take 81 mg by mouth daily. Swallow whole.     atorvastatin (LIPITOR) 20 MG tablet Take 1 tablet (20 mg total) by mouth daily. 90 tablet 1  Cholecalciferol (VITAMIN D) 2000 units tablet Take 2,000 Units by mouth daily.     Ensure (ENSURE) Take 237 mLs by mouth.     furosemide (LASIX) 20 MG tablet Take 1 tablet (20 mg total) by mouth daily. 30 tablet 0   metoprolol succinate (TOPROL-XL) 50 MG 24 hr tablet TAKE 1 TABLET BY MOUTH DAILY WITH OR IMMEDIATELY FOLLOWING A MEAL. 90 tablet 1   Multiple Vitamins-Minerals (CENTRUM SILVER ULTRA MENS PO) Take 1 tablet by mouth.     Multiple Vitamins-Minerals (ICAPS AREDS 2 PO) Take 1 tablet by mouth 2 (two) times daily.     No facility-administered medications prior to visit.    ROS: Review of Systems  Constitutional:  Negative for appetite  change, fatigue and unexpected weight change.  HENT:  Negative for congestion, nosebleeds, sneezing, sore throat and trouble swallowing.   Eyes:  Negative for itching and visual disturbance.  Respiratory:  Negative for cough.   Cardiovascular:  Negative for chest pain, palpitations and leg swelling.  Gastrointestinal:  Negative for abdominal distention, blood in stool, diarrhea and nausea.  Genitourinary:  Negative for frequency and hematuria.  Musculoskeletal:  Positive for gait problem. Negative for back pain, joint swelling and neck pain.  Skin:  Negative for rash.  Neurological:  Positive for weakness. Negative for dizziness, tremors and speech difficulty.  Hematological:  Does not bruise/bleed easily.  Psychiatric/Behavioral:  Negative for agitation, dysphoric mood, sleep disturbance and suicidal ideas. The patient is not nervous/anxious.    Objective:  BP 122/72 (BP Location: Right Arm, Patient Position: Sitting, Cuff Size: Large)    Pulse 82    Temp 98.2 F (36.8 C) (Oral)    Ht 5\' 6"  (1.676 m)    SpO2 97%    BMI 21.78 kg/m   BP Readings from Last 3 Encounters:  01/24/22 (!) 113/50  01/18/22 122/72  01/13/22 (!) 110/57    Wt Readings from Last 3 Encounters:  01/24/22 129 lb 11.2 oz (58.8 kg)  01/13/22 134 lb 14.7 oz (61.2 kg)  01/10/22 135 lb (61.2 kg)    Physical Exam Constitutional:      General: He is not in acute distress.    Appearance: Normal appearance. He is well-developed.     Comments: NAD  Eyes:     Conjunctiva/sclera: Conjunctivae normal.     Pupils: Pupils are equal, round, and reactive to light.  Neck:     Thyroid: No thyromegaly.     Vascular: No JVD.  Cardiovascular:     Rate and Rhythm: Normal rate and regular rhythm.     Heart sounds: Normal heart sounds. No murmur heard.   No friction rub. No gallop.  Pulmonary:     Effort: Pulmonary effort is normal. No respiratory distress.     Breath sounds: Normal breath sounds. No wheezing or rales.   Chest:     Chest wall: No tenderness.  Abdominal:     General: Bowel sounds are normal. There is no distension.     Palpations: Abdomen is soft. There is no mass.     Tenderness: There is no abdominal tenderness. There is no guarding or rebound.  Musculoskeletal:        General: Tenderness present. Normal range of motion.     Cervical back: Normal range of motion.  Lymphadenopathy:     Cervical: No cervical adenopathy.  Skin:    General: Skin is warm and dry.     Findings: No rash.  Neurological:     Mental  Status: He is alert and oriented to person, place, and time.     Cranial Nerves: No cranial nerve deficit.     Motor: No abnormal muscle tone.     Coordination: Coordination normal.     Gait: Gait normal.     Deep Tendon Reflexes: Reflexes are normal and symmetric.  Psychiatric:        Behavior: Behavior normal.        Thought Content: Thought content normal.        Judgment: Judgment normal.  Ataxic; in a wheelchair,    A total time of 45 minutes was spent preparing to see the patient, reviewing tests, x-rays, operative reports and hospital medical records.  Also, obtaining history and performing comprehensive physical exam.  Additionally, counseling the patient and his family regarding the above listed issues -fall prevention, walking aids.   Finally, documenting clinical information in the health records, coordination of care, educating the patient. It is a complex case.   Lab Results  Component Value Date   WBC 36.0 (H) 01/24/2022   HGB 12.9 (L) 01/24/2022   HCT 39.5 01/24/2022   PLT 171 01/24/2022   GLUCOSE 102 (H) 01/24/2022   CHOL 119 08/14/2021   TRIG 57.0 08/14/2021   HDL 53.30 08/14/2021   LDLCALC 55 08/14/2021   ALT 16 01/24/2022   AST 17 01/24/2022   NA 137 01/24/2022   K 4.0 01/24/2022   CL 98 01/24/2022   CREATININE 0.74 01/24/2022   BUN 27 (H) 01/24/2022   CO2 32 01/24/2022   TSH 1.62 08/14/2021   PSA 8.51 (H) 08/14/2021    No results  found.  Assessment & Plan:   Problem List Items Addressed This Visit     Essential hypertension    Reduce Metoprolol dose in 1/2, furosemide prn      Relevant Medications   metoprolol succinate (TOPROL-XL) 50 MG 24 hr tablet   Falls    Fall prevention and fall implications were discussed.  He will count to 5 when getting up from sitting position.  He will make sure that his leg did not go to sleep after sitting on the toilet for a long time.  Physical therapy      Orthostatic hypotension    Reduce Metoprolol dose in 1/2, furosemide prn Stand up slow      Relevant Medications   metoprolol succinate (TOPROL-XL) 50 MG 24 hr tablet      Meds ordered this encounter  Medications   metoprolol succinate (TOPROL-XL) 50 MG 24 hr tablet    Sig: Take 0.5 tablets (25 mg total) by mouth daily. TAKE 1 TABLET BY MOUTH DAILY WITH OR IMMEDIATELY FOLLOWING A MEAL.    Dispense:  90 tablet    Refill:  1      Follow-up: Return in about 4 weeks (around 02/15/2022) for a follow-up visit.  Walker Kehr, MD

## 2022-01-21 ENCOUNTER — Telehealth: Payer: Self-pay | Admitting: Internal Medicine

## 2022-01-21 NOTE — Telephone Encounter (Signed)
Rescheduled 02/22 appointment to 02/16 due to provider pal. Patient has been called and unitized.

## 2022-01-22 ENCOUNTER — Telehealth: Payer: Self-pay

## 2022-01-22 ENCOUNTER — Telehealth: Payer: Self-pay | Admitting: Internal Medicine

## 2022-01-22 DIAGNOSIS — R29898 Other symptoms and signs involving the musculoskeletal system: Secondary | ICD-10-CM

## 2022-01-22 DIAGNOSIS — M4807 Spinal stenosis, lumbosacral region: Secondary | ICD-10-CM

## 2022-01-22 DIAGNOSIS — I10 Essential (primary) hypertension: Secondary | ICD-10-CM

## 2022-01-22 DIAGNOSIS — R600 Localized edema: Secondary | ICD-10-CM

## 2022-01-22 DIAGNOSIS — M5442 Lumbago with sciatica, left side: Secondary | ICD-10-CM

## 2022-01-22 DIAGNOSIS — R262 Difficulty in walking, not elsewhere classified: Secondary | ICD-10-CM

## 2022-01-22 NOTE — Telephone Encounter (Signed)
Dorian from [facility] has called and is requesting orders for PT for 1-2x week for 8 weeks.

## 2022-01-22 NOTE — Telephone Encounter (Signed)
Clair Gulling ot w/ [facility] requesting a rx for a 3 in 1 potty chair faxed to [facility]  Fax  [phone]

## 2022-01-22 NOTE — Telephone Encounter (Signed)
Pt is states he was in the office on 2/10 and received am Rx for 3-1 High toilet seat.  [facility] told the pt Dr. Alain Marion will need to electronically submit the Rx (email or fax)  [phone] [facility]  Pt CB [phone]

## 2022-01-23 DIAGNOSIS — M4800 Spinal stenosis, site unspecified: Secondary | ICD-10-CM | POA: Diagnosis not present

## 2022-01-23 DIAGNOSIS — M87851 Other osteonecrosis, right femur: Secondary | ICD-10-CM | POA: Diagnosis not present

## 2022-01-23 DIAGNOSIS — M160 Bilateral primary osteoarthritis of hip: Secondary | ICD-10-CM | POA: Diagnosis not present

## 2022-01-23 DIAGNOSIS — I10 Essential (primary) hypertension: Secondary | ICD-10-CM | POA: Diagnosis not present

## 2022-01-23 DIAGNOSIS — G3184 Mild cognitive impairment, so stated: Secondary | ICD-10-CM | POA: Diagnosis not present

## 2022-01-23 DIAGNOSIS — M87852 Other osteonecrosis, left femur: Secondary | ICD-10-CM | POA: Diagnosis not present

## 2022-01-23 NOTE — Telephone Encounter (Signed)
Okay.  Prescription was given 2 doses son at the visit.  Please print another DME and fax.  Thanks

## 2022-01-24 ENCOUNTER — Inpatient Hospital Stay: Payer: Medicare Other | Attending: Internal Medicine

## 2022-01-24 ENCOUNTER — Encounter: Payer: Self-pay | Admitting: Internal Medicine

## 2022-01-24 ENCOUNTER — Other Ambulatory Visit: Payer: Self-pay

## 2022-01-24 ENCOUNTER — Inpatient Hospital Stay (HOSPITAL_BASED_OUTPATIENT_CLINIC_OR_DEPARTMENT_OTHER): Payer: Medicare Other | Admitting: Internal Medicine

## 2022-01-24 VITALS — BP 113/50 | HR 66 | Temp 97.1°F | Resp 18 | Ht 66.0 in | Wt 129.7 lb

## 2022-01-24 DIAGNOSIS — C9110 Chronic lymphocytic leukemia of B-cell type not having achieved remission: Secondary | ICD-10-CM

## 2022-01-24 DIAGNOSIS — D649 Anemia, unspecified: Secondary | ICD-10-CM | POA: Insufficient documentation

## 2022-01-24 DIAGNOSIS — I10 Essential (primary) hypertension: Secondary | ICD-10-CM | POA: Insufficient documentation

## 2022-01-24 DIAGNOSIS — Z79899 Other long term (current) drug therapy: Secondary | ICD-10-CM | POA: Insufficient documentation

## 2022-01-24 DIAGNOSIS — Z7982 Long term (current) use of aspirin: Secondary | ICD-10-CM | POA: Diagnosis not present

## 2022-01-24 LAB — CBC WITH DIFFERENTIAL (CANCER CENTER ONLY)
Abs Immature Granulocytes: 0.05 10*3/uL (ref 0.00–0.07)
Basophils Absolute: 0.1 10*3/uL (ref 0.0–0.1)
Basophils Relative: 0 %
Eosinophils Absolute: 0.1 10*3/uL (ref 0.0–0.5)
Eosinophils Relative: 0 %
HCT: 39.5 % (ref 39.0–52.0)
Hemoglobin: 12.9 g/dL — ABNORMAL LOW (ref 13.0–17.0)
Immature Granulocytes: 0 %
Lymphocytes Relative: 89 %
Lymphs Abs: 31.9 10*3/uL — ABNORMAL HIGH (ref 0.7–4.0)
MCH: 30.9 pg (ref 26.0–34.0)
MCHC: 32.7 g/dL (ref 30.0–36.0)
MCV: 94.5 fL (ref 80.0–100.0)
Monocytes Absolute: 0.4 10*3/uL (ref 0.1–1.0)
Monocytes Relative: 1 %
Neutro Abs: 3.5 10*3/uL (ref 1.7–7.7)
Neutrophils Relative %: 10 %
Platelet Count: 171 10*3/uL (ref 150–400)
RBC: 4.18 MIL/uL — ABNORMAL LOW (ref 4.22–5.81)
RDW: 13.2 % (ref 11.5–15.5)
Smear Review: NORMAL
WBC Count: 36 10*3/uL — ABNORMAL HIGH (ref 4.0–10.5)
nRBC: 0 % (ref 0.0–0.2)

## 2022-01-24 LAB — CMP (CANCER CENTER ONLY)
ALT: 16 U/L (ref 0–44)
AST: 17 U/L (ref 15–41)
Albumin: 4 g/dL (ref 3.5–5.0)
Alkaline Phosphatase: 86 U/L (ref 38–126)
Anion gap: 7 (ref 5–15)
BUN: 27 mg/dL — ABNORMAL HIGH (ref 8–23)
CO2: 32 mmol/L (ref 22–32)
Calcium: 8.9 mg/dL (ref 8.9–10.3)
Chloride: 98 mmol/L (ref 98–111)
Creatinine: 0.74 mg/dL (ref 0.61–1.24)
GFR, Estimated: >60 mL/min (ref >60)
Glucose, Bld: 102 mg/dL — ABNORMAL HIGH (ref 70–99)
Potassium: 4 mmol/L (ref 3.5–5.1)
Sodium: 137 mmol/L (ref 135–145)
Total Bilirubin: 1 mg/dL (ref 0.3–1.2)
Total Protein: 6.4 g/dL — ABNORMAL LOW (ref 6.5–8.1)

## 2022-01-24 LAB — LACTATE DEHYDROGENASE: LDH: 153 U/L (ref 98–192)

## 2022-01-24 NOTE — Telephone Encounter (Signed)
Okay.  Thanks.

## 2022-01-24 NOTE — Telephone Encounter (Signed)
Okay.  Please do.  Thank you

## 2022-01-24 NOTE — Progress Notes (Signed)
[facility] [phone]   [phone]  OFFICE PROGRESS NOTE  [name], [name], MD [place] [id number]  DIAGNOSIS: Stage 0 Chronic lymphocytic leukemia   PRIOR THERAPY: None   CURRENT THERAPY: Observation  INTERVAL HISTORY: Jerry Gomez 88 y.o. male returns to the clinic today for follow-up visit accompanied by his son.  The patient is feeling fine today with no concerning complaints except for generalized fatigue.  He had a fall at home recently and currently more careful using a walker for ambulation.  He denied having any current chest pain, shortness of breath, cough or hemoptysis.  He has no nausea, vomiting, diarrhea or constipation.  He has no headache or visual changes.  He is here today for evaluation and repeat blood work.  MEDICAL HISTORY: Past Medical History:  Diagnosis Date   Cancer ([place])    CLL   Cataract    COLONIC POLYPS 03/02/2009   EYE SURGERY, HX OF 09/05/2007   HEMORRHOIDS, HX OF    HYPERLIPIDEMIA 09/05/2007   HYPERTENSION 07/25/2007   OSTEOARTHRITIS 09/05/2007   SLEEP APNEA, OBSTRUCTIVE 09/05/2007    ALLERGIES:  has No Known Allergies.  MEDICATIONS:  Current Outpatient Medications  Medication Sig Dispense Refill   Artificial Tear Ointment (ARTIFICIAL TEARS) ointment Place into both eyes nightly.     aspirin 81 MG EC tablet Take 81 mg by mouth daily. Swallow whole.     atorvastatin (LIPITOR) 20 MG tablet Take 1 tablet (20 mg total) by mouth daily. 90 tablet 1   Cholecalciferol (VITAMIN D) 2000 units tablet Take 2,000 Units by mouth daily.     Ensure (ENSURE) Take 237 mLs by mouth.     furosemide (LASIX) 20 MG tablet Take 1 tablet (20 mg total) by mouth daily. 30 tablet 0   metoprolol succinate (TOPROL-XL) 50 MG 24 hr tablet Take 0.5 tablets (25 mg total) by mouth daily. TAKE 1 TABLET BY MOUTH DAILY WITH OR IMMEDIATELY FOLLOWING A MEAL. 90 tablet 1   Multiple Vitamins-Minerals (MULTIVITAMIN  ADULT) CHEW Chew by mouth.     No current facility-administered medications for this visit.    SURGICAL HISTORY:  Past Surgical History:  Procedure Laterality Date   EYE SURGERY  10/22/13   left eye, cataract    REVIEW OF SYSTEMS:  A comprehensive review of systems was negative except for: Constitutional: positive for fatigue Musculoskeletal: positive for muscle weakness   PHYSICAL EXAMINATION: General appearance: alert, cooperative, appears stated age, and fatigued Head: Normocephalic, without obvious abnormality, atraumatic Neck: no adenopathy, no JVD, supple, symmetrical, trachea midline, and thyroid not enlarged, symmetric, no tenderness/mass/nodules Lymph nodes: Cervical, supraclavicular, and axillary nodes normal. Resp: clear to auscultation bilaterally Back: symmetric, no curvature. ROM normal. No CVA tenderness. Cardio: regular rate and rhythm, S1, S2 normal, no murmur, click, rub or gallop GI: soft, non-tender; bowel sounds normal; no masses,  no organomegaly Extremities: extremities normal, atraumatic, no cyanosis or edema  ECOG PERFORMANCE STATUS: 1 - Symptomatic but completely ambulatory  Blood pressure (!) 113/50, pulse 66, temperature (!) 97.1 F (36.2 C), temperature source Tympanic, resp. rate 18, height 5\' 6"  (1.676 m), weight 129 lb 11.2 oz (58.8 kg), SpO2 100 %.  LABORATORY DATA: Lab Results  Component Value Date   WBC 36.0 (H) 01/24/2022   HGB 12.9 (L) 01/24/2022   HCT 39.5 01/24/2022   MCV 94.5 01/24/2022   PLT 171 01/24/2022      Chemistry  Component Value Date/Time   NA 136 01/11/2022 0208   NA 141 06/17/2017 1259   K 3.8 01/11/2022 0208   K 4.5 06/17/2017 1259   CL 102 01/11/2022 0208   CL 104 12/14/2012 1031   CO2 28 01/11/2022 0208   CO2 30 (H) 06/17/2017 1259   BUN 14 01/11/2022 0208   BUN 16.4 06/17/2017 1259   CREATININE 0.66 01/11/2022 0208   CREATININE 0.87 07/30/2021 1004   CREATININE 1.1 06/17/2017 1259      Component Value  Date/Time   CALCIUM 8.3 (L) 01/11/2022 0208   CALCIUM 9.8 06/17/2017 1259   ALKPHOS 71 01/10/2022 1444   ALKPHOS 75 06/17/2017 1259   AST 41 01/10/2022 1444   AST 18 07/30/2021 1004   AST 29 06/17/2017 1259   ALT 27 01/10/2022 1444   ALT 13 07/30/2021 1004   ALT 27 06/17/2017 1259   BILITOT 1.1 01/10/2022 1444   BILITOT 1.5 (H) 07/30/2021 1004   BILITOT 2.19 (H) 06/17/2017 1259       RADIOGRAPHIC STUDIES: No results found.  ASSESSMENT AND PLAN:  This is a very pleasant 88 years old white male with a stage 0 chronic lymphocytic leukemia.  The patient has been in observation for several years now and he is feeling fine with no concerning complaints except for the recent fatigue and weakness.  He has a recent fall but no injuries.  Repeat CBC today showed persistent leukocytosis but within the previous range with total white blood count of 36,000.  He has mild anemia with hemoglobin of 12.9 and hematocrit 39.5% and platelets count 171,000. I recommended for the patient to continue on observation with repeat CBC, comprehensive metabolic panel and LDH in 6 months. He was advised to call immediately if he has any other concerning symptoms in the interval.  All questions were answered. The patient knows to call the clinic with any problems, questions or concerns. We can certainly see the patient much sooner if necessary.  Disclaimer: This note was dictated with voice recognition software. Similar sounding words can inadvertently be transcribed and may not be corrected upon review.

## 2022-01-25 DIAGNOSIS — M87852 Other osteonecrosis, left femur: Secondary | ICD-10-CM | POA: Diagnosis not present

## 2022-01-25 DIAGNOSIS — M4800 Spinal stenosis, site unspecified: Secondary | ICD-10-CM | POA: Diagnosis not present

## 2022-01-25 DIAGNOSIS — M87851 Other osteonecrosis, right femur: Secondary | ICD-10-CM | POA: Diagnosis not present

## 2022-01-25 DIAGNOSIS — M160 Bilateral primary osteoarthritis of hip: Secondary | ICD-10-CM | POA: Diagnosis not present

## 2022-01-25 DIAGNOSIS — G3184 Mild cognitive impairment, so stated: Secondary | ICD-10-CM | POA: Diagnosis not present

## 2022-01-25 DIAGNOSIS — I10 Essential (primary) hypertension: Secondary | ICD-10-CM | POA: Diagnosis not present

## 2022-01-28 DIAGNOSIS — M4800 Spinal stenosis, site unspecified: Secondary | ICD-10-CM | POA: Diagnosis not present

## 2022-01-28 DIAGNOSIS — I10 Essential (primary) hypertension: Secondary | ICD-10-CM | POA: Diagnosis not present

## 2022-01-28 DIAGNOSIS — M87851 Other osteonecrosis, right femur: Secondary | ICD-10-CM | POA: Diagnosis not present

## 2022-01-28 DIAGNOSIS — M87852 Other osteonecrosis, left femur: Secondary | ICD-10-CM | POA: Diagnosis not present

## 2022-01-28 DIAGNOSIS — M160 Bilateral primary osteoarthritis of hip: Secondary | ICD-10-CM | POA: Diagnosis not present

## 2022-01-28 DIAGNOSIS — G3184 Mild cognitive impairment, so stated: Secondary | ICD-10-CM | POA: Diagnosis not present

## 2022-01-29 DIAGNOSIS — M87851 Other osteonecrosis, right femur: Secondary | ICD-10-CM | POA: Diagnosis not present

## 2022-01-29 DIAGNOSIS — G3184 Mild cognitive impairment, so stated: Secondary | ICD-10-CM | POA: Diagnosis not present

## 2022-01-29 DIAGNOSIS — M160 Bilateral primary osteoarthritis of hip: Secondary | ICD-10-CM | POA: Diagnosis not present

## 2022-01-29 DIAGNOSIS — I10 Essential (primary) hypertension: Secondary | ICD-10-CM | POA: Diagnosis not present

## 2022-01-29 DIAGNOSIS — M87852 Other osteonecrosis, left femur: Secondary | ICD-10-CM | POA: Diagnosis not present

## 2022-01-29 DIAGNOSIS — M4800 Spinal stenosis, site unspecified: Secondary | ICD-10-CM | POA: Diagnosis not present

## 2022-01-30 ENCOUNTER — Inpatient Hospital Stay: Payer: Medicare Other

## 2022-01-30 ENCOUNTER — Inpatient Hospital Stay: Payer: Medicare Other | Admitting: Internal Medicine

## 2022-01-30 NOTE — Telephone Encounter (Signed)
Fax was sent and confirmation was sent back with Fax going through.

## 2022-01-30 NOTE — Addendum Note (Signed)
Addended by: Marijean Heath R on: 01/30/2022 09:38 AM   Modules accepted: Orders

## 2022-01-31 DIAGNOSIS — M87851 Other osteonecrosis, right femur: Secondary | ICD-10-CM | POA: Diagnosis not present

## 2022-01-31 DIAGNOSIS — I10 Essential (primary) hypertension: Secondary | ICD-10-CM | POA: Diagnosis not present

## 2022-01-31 DIAGNOSIS — M4800 Spinal stenosis, site unspecified: Secondary | ICD-10-CM | POA: Diagnosis not present

## 2022-01-31 DIAGNOSIS — W19XXXA Unspecified fall, initial encounter: Secondary | ICD-10-CM | POA: Insufficient documentation

## 2022-01-31 DIAGNOSIS — M87852 Other osteonecrosis, left femur: Secondary | ICD-10-CM | POA: Diagnosis not present

## 2022-01-31 DIAGNOSIS — M160 Bilateral primary osteoarthritis of hip: Secondary | ICD-10-CM | POA: Diagnosis not present

## 2022-01-31 DIAGNOSIS — G3184 Mild cognitive impairment, so stated: Secondary | ICD-10-CM | POA: Diagnosis not present

## 2022-01-31 NOTE — Assessment & Plan Note (Addendum)
Fall prevention and fall implications were discussed.  He will count to 5 when getting up from sitting position.  He will make sure that his leg did not go to sleep after sitting on the toilet for a long time.  Physical therapy

## 2022-02-04 DIAGNOSIS — I10 Essential (primary) hypertension: Secondary | ICD-10-CM | POA: Diagnosis not present

## 2022-02-04 DIAGNOSIS — G3184 Mild cognitive impairment, so stated: Secondary | ICD-10-CM | POA: Diagnosis not present

## 2022-02-04 DIAGNOSIS — M160 Bilateral primary osteoarthritis of hip: Secondary | ICD-10-CM | POA: Diagnosis not present

## 2022-02-04 DIAGNOSIS — M87851 Other osteonecrosis, right femur: Secondary | ICD-10-CM | POA: Diagnosis not present

## 2022-02-04 DIAGNOSIS — M4800 Spinal stenosis, site unspecified: Secondary | ICD-10-CM | POA: Diagnosis not present

## 2022-02-04 DIAGNOSIS — M87852 Other osteonecrosis, left femur: Secondary | ICD-10-CM | POA: Diagnosis not present

## 2022-02-14 DIAGNOSIS — K649 Unspecified hemorrhoids: Secondary | ICD-10-CM | POA: Diagnosis not present

## 2022-02-14 DIAGNOSIS — M87851 Other osteonecrosis, right femur: Secondary | ICD-10-CM | POA: Diagnosis not present

## 2022-02-14 DIAGNOSIS — S41112D Laceration without foreign body of left upper arm, subsequent encounter: Secondary | ICD-10-CM | POA: Diagnosis not present

## 2022-02-14 DIAGNOSIS — C9110 Chronic lymphocytic leukemia of B-cell type not having achieved remission: Secondary | ICD-10-CM | POA: Diagnosis not present

## 2022-02-14 DIAGNOSIS — Z86010 Personal history of colonic polyps: Secondary | ICD-10-CM | POA: Diagnosis not present

## 2022-02-14 DIAGNOSIS — G4733 Obstructive sleep apnea (adult) (pediatric): Secondary | ICD-10-CM | POA: Diagnosis not present

## 2022-02-14 DIAGNOSIS — I10 Essential (primary) hypertension: Secondary | ICD-10-CM | POA: Diagnosis not present

## 2022-02-14 DIAGNOSIS — M160 Bilateral primary osteoarthritis of hip: Secondary | ICD-10-CM | POA: Diagnosis not present

## 2022-02-14 DIAGNOSIS — Z7982 Long term (current) use of aspirin: Secondary | ICD-10-CM | POA: Diagnosis not present

## 2022-02-14 DIAGNOSIS — G3184 Mild cognitive impairment, so stated: Secondary | ICD-10-CM | POA: Diagnosis not present

## 2022-02-14 DIAGNOSIS — E785 Hyperlipidemia, unspecified: Secondary | ICD-10-CM | POA: Diagnosis not present

## 2022-02-14 DIAGNOSIS — Z9181 History of falling: Secondary | ICD-10-CM | POA: Diagnosis not present

## 2022-02-14 DIAGNOSIS — M4800 Spinal stenosis, site unspecified: Secondary | ICD-10-CM | POA: Diagnosis not present

## 2022-02-14 DIAGNOSIS — Z87891 Personal history of nicotine dependence: Secondary | ICD-10-CM | POA: Diagnosis not present

## 2022-02-14 DIAGNOSIS — M87852 Other osteonecrosis, left femur: Secondary | ICD-10-CM | POA: Diagnosis not present

## 2022-02-15 DIAGNOSIS — G3184 Mild cognitive impairment, so stated: Secondary | ICD-10-CM | POA: Diagnosis not present

## 2022-02-15 DIAGNOSIS — M160 Bilateral primary osteoarthritis of hip: Secondary | ICD-10-CM | POA: Diagnosis not present

## 2022-02-15 DIAGNOSIS — M87852 Other osteonecrosis, left femur: Secondary | ICD-10-CM | POA: Diagnosis not present

## 2022-02-15 DIAGNOSIS — M4800 Spinal stenosis, site unspecified: Secondary | ICD-10-CM | POA: Diagnosis not present

## 2022-02-15 DIAGNOSIS — I10 Essential (primary) hypertension: Secondary | ICD-10-CM | POA: Diagnosis not present

## 2022-02-15 DIAGNOSIS — M87851 Other osteonecrosis, right femur: Secondary | ICD-10-CM | POA: Diagnosis not present

## 2022-02-20 ENCOUNTER — Ambulatory Visit: Payer: Medicare Other | Admitting: Internal Medicine

## 2022-02-20 DIAGNOSIS — M160 Bilateral primary osteoarthritis of hip: Secondary | ICD-10-CM | POA: Diagnosis not present

## 2022-02-20 DIAGNOSIS — M4800 Spinal stenosis, site unspecified: Secondary | ICD-10-CM | POA: Diagnosis not present

## 2022-02-20 DIAGNOSIS — M87851 Other osteonecrosis, right femur: Secondary | ICD-10-CM | POA: Diagnosis not present

## 2022-02-20 DIAGNOSIS — I10 Essential (primary) hypertension: Secondary | ICD-10-CM | POA: Diagnosis not present

## 2022-02-20 DIAGNOSIS — M87852 Other osteonecrosis, left femur: Secondary | ICD-10-CM | POA: Diagnosis not present

## 2022-02-20 DIAGNOSIS — G3184 Mild cognitive impairment, so stated: Secondary | ICD-10-CM | POA: Diagnosis not present

## 2022-02-21 ENCOUNTER — Ambulatory Visit (INDEPENDENT_AMBULATORY_CARE_PROVIDER_SITE_OTHER): Payer: Medicare Other | Admitting: Internal Medicine

## 2022-02-21 ENCOUNTER — Other Ambulatory Visit: Payer: Self-pay

## 2022-02-21 ENCOUNTER — Encounter: Payer: Self-pay | Admitting: Internal Medicine

## 2022-02-21 DIAGNOSIS — W19XXXD Unspecified fall, subsequent encounter: Secondary | ICD-10-CM

## 2022-02-21 DIAGNOSIS — M4807 Spinal stenosis, lumbosacral region: Secondary | ICD-10-CM

## 2022-02-21 DIAGNOSIS — I10 Essential (primary) hypertension: Secondary | ICD-10-CM | POA: Diagnosis not present

## 2022-02-21 DIAGNOSIS — R400 Somnolence: Secondary | ICD-10-CM

## 2022-02-21 MED ORDER — METOPROLOL SUCCINATE ER 25 MG PO TB24: 12.5000 mg | ORAL_TABLET | Freq: Every day | ORAL | 3 refills | Status: DC

## 2022-02-21 NOTE — Assessment & Plan Note (Signed)
Low BP ?Reduce Metoprolol dose in 1/2, furosemide prn. Low BP - reduce to 1/4 tab or stop Metoprolol ?

## 2022-02-21 NOTE — Assessment & Plan Note (Signed)
Tylenol prn 

## 2022-02-21 NOTE — Patient Instructions (Addendum)
Tylenol 500 mg as needed ?OK to stop Metoprolol ?

## 2022-02-24 DIAGNOSIS — R400 Somnolence: Secondary | ICD-10-CM | POA: Insufficient documentation

## 2022-02-24 NOTE — Progress Notes (Signed)
? ?Subjective:  ?Patient ID: Jerry Gomez, male    DOB: 27-Sep-1931  Age: 88 y.o. MRN: [id number] ? ?CC: No chief complaint on file. ? ? ?HPI ?Barnet Pall presents for falls - no relapse ?No change in his overall state ?SBP 100-120 in am ?In PT - just re-started at home ?  ?C/o somnolence x year; lately she started to fall asleep while eating.  She is here with his wife and his son who helps with history ? ?Outpatient Medications Prior to Visit  ?Medication Sig Dispense Refill  ? Artificial Tear Ointment (ARTIFICIAL TEARS) ointment Place into both eyes nightly.    ? aspirin 81 MG EC tablet Take 81 mg by mouth daily. Swallow whole.    ? atorvastatin (LIPITOR) 20 MG tablet Take 1 tablet (20 mg total) by mouth daily. 90 tablet 1  ? Cholecalciferol (VITAMIN D) 2000 units tablet Take 2,000 Units by mouth daily.    ? Ensure (ENSURE) Take 237 mLs by mouth.    ? Multiple Vitamins-Minerals (MULTIVITAMIN ADULT) CHEW Chew by mouth.    ? metoprolol succinate (TOPROL-XL) 50 MG 24 hr tablet Take 0.5 tablets (25 mg total) by mouth daily. TAKE 1 TABLET BY MOUTH DAILY WITH OR IMMEDIATELY FOLLOWING A MEAL. 90 tablet 1  ? furosemide (LASIX) 20 MG tablet Take 1 tablet (20 mg total) by mouth daily. 30 tablet 0  ? ?No facility-administered medications prior to visit.  ? ? ?ROS: ?Review of Systems  ?Constitutional:  Positive for fatigue. Negative for appetite change and unexpected weight change.  ?HENT:  Negative for congestion, nosebleeds, sneezing, sore throat and trouble swallowing.   ?Eyes:  Negative for itching and visual disturbance.  ?Respiratory:  Negative for cough.   ?Cardiovascular:  Negative for chest pain, palpitations and leg swelling.  ?Gastrointestinal:  Negative for abdominal distention, blood in stool, diarrhea and nausea.  ?Genitourinary:  Negative for frequency and hematuria.  ?Musculoskeletal:  Positive for gait problem. Negative for back pain, joint swelling and neck pain.  ?Skin:  Negative for rash.  ?Neurological:   Positive for weakness. Negative for dizziness, tremors and speech difficulty.  ?Psychiatric/Behavioral:  Negative for agitation, decreased concentration, dysphoric mood and sleep disturbance. The patient is not nervous/anxious.   ? ?Objective:  ?BP (!) 98/50 (BP Location: Left Arm, Patient Position: Sitting, Cuff Size: Large)   Pulse 76   Temp 98.3 ?F (36.8 ?C) (Oral)   Ht '5\' 6"'$  (1.676 m)   SpO2 96%   BMI 20.93 kg/m?  ? ?BP Readings from Last 3 Encounters:  ?02/21/22 (!) 98/50  ?01/24/22 (!) 113/50  ?01/18/22 122/72  ? ? ?Wt Readings from Last 3 Encounters:  ?01/24/22 129 lb 11.2 oz (58.8 kg)  ?01/13/22 134 lb 14.7 oz (61.2 kg)  ?01/10/22 135 lb (61.2 kg)  ? ? ?Physical Exam ?Constitutional:   ?   General: He is not in acute distress. ?   Appearance: He is well-developed.  ?   Comments: NAD  ?Eyes:  ?   Conjunctiva/sclera: Conjunctivae normal.  ?   Pupils: Pupils are equal, round, and reactive to light.  ?Neck:  ?   Thyroid: No thyromegaly.  ?   Vascular: No JVD.  ?Cardiovascular:  ?   Rate and Rhythm: Normal rate and regular rhythm.  ?   Heart sounds: Normal heart sounds. No murmur heard. ?  No friction rub. No gallop.  ?Pulmonary:  ?   Effort: Pulmonary effort is normal. No respiratory distress.  ?   Breath  sounds: Normal breath sounds. No wheezing or rales.  ?Chest:  ?   Chest wall: No tenderness.  ?Abdominal:  ?   General: Bowel sounds are normal. There is no distension.  ?   Palpations: Abdomen is soft. There is no mass.  ?   Tenderness: There is no abdominal tenderness. There is no guarding or rebound.  ?Musculoskeletal:     ?   General: No tenderness. Normal range of motion.  ?   Cervical back: Normal range of motion.  ?Lymphadenopathy:  ?   Cervical: No cervical adenopathy.  ?Skin: ?   General: Skin is warm and dry.  ?   Findings: No rash.  ?Neurological:  ?   Mental Status: He is alert and oriented to person, place, and time.  ?   Cranial Nerves: No cranial nerve deficit.  ?   Motor: Weakness present.  No abnormal muscle tone.  ?   Coordination: Coordination normal.  ?   Gait: Gait abnormal.  ?   Deep Tendon Reflexes: Reflexes are normal and symmetric.  ?Psychiatric:     ?   Behavior: Behavior normal.     ?   Thought Content: Thought content normal.     ?   Judgment: Judgment normal.  ?he is in a wheelchair ? ?Lab Results  ?Component Value Date  ? WBC 36.0 (H) 01/24/2022  ? HGB 12.9 (L) 01/24/2022  ? HCT 39.5 01/24/2022  ? PLT 171 01/24/2022  ? GLUCOSE 102 (H) 01/24/2022  ? CHOL 119 08/14/2021  ? TRIG 57.0 08/14/2021  ? HDL 53.30 08/14/2021  ? LDLCALC 55 08/14/2021  ? ALT 16 01/24/2022  ? AST 17 01/24/2022  ? NA 137 01/24/2022  ? K 4.0 01/24/2022  ? CL 98 01/24/2022  ? CREATININE 0.74 01/24/2022  ? BUN 27 (H) 01/24/2022  ? CO2 32 01/24/2022  ? TSH 1.62 08/14/2021  ? PSA 8.51 (H) 08/14/2021  ? ? ?No results found. ? ?Assessment & Plan:  ? ?Problem List Items Addressed This Visit   ? ? Essential hypertension  ?  Low BP ?Reduce Metoprolol dose in 1/2, furosemide prn. Low BP - reduce to 1/4 tab or stop Metoprolol ?  ?  ? Relevant Orders  ? Comprehensive metabolic panel  ? TSH  ? Falls  ?  Low BP ?Reduce Metoprolol dose in 1/2, furosemide prn. Low BP - reduce to 1/4 tab or stop Metoprolol ?  ?  ? Relevant Orders  ? Comprehensive metabolic panel  ? TSH  ? Somnolence, daytime  ?  Worse.  He would fall asleep while eating lately.  We discussed the risk of aspiration with Mrs. Brisendine.  She will watch her husband eat.  It did not seem to be a problem before.  We can consider low-dose Ritalin to help with somnolence. ?  ?  ? Spinal stenosis  ?  Tylenol prn ?  ?  ?  ? ? ?Meds ordered this encounter  ?Medications  ? DISCONTD: metoprolol succinate (TOPROL-XL) 25 MG 24 hr tablet  ?  Sig: Take 0.5 tablets (12.5 mg total) by mouth daily.  ?  Dispense:  45 tablet  ?  Refill:  3  ?  ? ? ?Follow-up: Return in about 2 months (around 04/23/2022) for a follow-up visit. ? ?Walker Kehr, MD ? ?

## 2022-02-24 NOTE — Assessment & Plan Note (Signed)
Worse.  He would fall asleep while eating lately.  We discussed the risk of aspiration with Mrs. Alegria.  She will watch her husband eat.  It did not seem to be a problem before.  We can consider low-dose Ritalin to help with somnolence. ?

## 2022-02-25 DIAGNOSIS — M4800 Spinal stenosis, site unspecified: Secondary | ICD-10-CM | POA: Diagnosis not present

## 2022-02-25 DIAGNOSIS — M87851 Other osteonecrosis, right femur: Secondary | ICD-10-CM | POA: Diagnosis not present

## 2022-02-25 DIAGNOSIS — M87852 Other osteonecrosis, left femur: Secondary | ICD-10-CM | POA: Diagnosis not present

## 2022-02-25 DIAGNOSIS — I10 Essential (primary) hypertension: Secondary | ICD-10-CM | POA: Diagnosis not present

## 2022-02-25 DIAGNOSIS — M160 Bilateral primary osteoarthritis of hip: Secondary | ICD-10-CM | POA: Diagnosis not present

## 2022-02-25 DIAGNOSIS — G3184 Mild cognitive impairment, so stated: Secondary | ICD-10-CM | POA: Diagnosis not present

## 2022-03-07 DIAGNOSIS — M87852 Other osteonecrosis, left femur: Secondary | ICD-10-CM | POA: Diagnosis not present

## 2022-03-07 DIAGNOSIS — I10 Essential (primary) hypertension: Secondary | ICD-10-CM | POA: Diagnosis not present

## 2022-03-07 DIAGNOSIS — G3184 Mild cognitive impairment, so stated: Secondary | ICD-10-CM | POA: Diagnosis not present

## 2022-03-07 DIAGNOSIS — M87851 Other osteonecrosis, right femur: Secondary | ICD-10-CM | POA: Diagnosis not present

## 2022-03-07 DIAGNOSIS — M160 Bilateral primary osteoarthritis of hip: Secondary | ICD-10-CM | POA: Diagnosis not present

## 2022-03-07 DIAGNOSIS — M4800 Spinal stenosis, site unspecified: Secondary | ICD-10-CM | POA: Diagnosis not present

## 2022-03-13 DIAGNOSIS — G3184 Mild cognitive impairment, so stated: Secondary | ICD-10-CM | POA: Diagnosis not present

## 2022-03-13 DIAGNOSIS — M87851 Other osteonecrosis, right femur: Secondary | ICD-10-CM | POA: Diagnosis not present

## 2022-03-13 DIAGNOSIS — M160 Bilateral primary osteoarthritis of hip: Secondary | ICD-10-CM | POA: Diagnosis not present

## 2022-03-13 DIAGNOSIS — M87852 Other osteonecrosis, left femur: Secondary | ICD-10-CM | POA: Diagnosis not present

## 2022-03-13 DIAGNOSIS — I10 Essential (primary) hypertension: Secondary | ICD-10-CM | POA: Diagnosis not present

## 2022-03-13 DIAGNOSIS — M4800 Spinal stenosis, site unspecified: Secondary | ICD-10-CM | POA: Diagnosis not present

## 2022-03-14 ENCOUNTER — Telehealth: Payer: Self-pay | Admitting: Internal Medicine

## 2022-03-14 NOTE — Telephone Encounter (Signed)
Jerry Gomez with [facility] calls today to extend physical therapy orders for PT. Physical therapy orders are ? ?1 week for 4 weeks ?CB: [phone] ? ?Jerry Gomez also adds in that physical therapy would probably be discontinued after these 4 weeks. PT has an orthopedic appointment set up for next week due to trouble with hip   ?

## 2022-03-14 NOTE — Telephone Encounter (Signed)
OK. Thx

## 2022-03-16 DIAGNOSIS — G3184 Mild cognitive impairment, so stated: Secondary | ICD-10-CM | POA: Diagnosis not present

## 2022-03-16 DIAGNOSIS — E785 Hyperlipidemia, unspecified: Secondary | ICD-10-CM | POA: Diagnosis not present

## 2022-03-16 DIAGNOSIS — C9110 Chronic lymphocytic leukemia of B-cell type not having achieved remission: Secondary | ICD-10-CM | POA: Diagnosis not present

## 2022-03-16 DIAGNOSIS — M87851 Other osteonecrosis, right femur: Secondary | ICD-10-CM | POA: Diagnosis not present

## 2022-03-16 DIAGNOSIS — S41112D Laceration without foreign body of left upper arm, subsequent encounter: Secondary | ICD-10-CM | POA: Diagnosis not present

## 2022-03-16 DIAGNOSIS — K649 Unspecified hemorrhoids: Secondary | ICD-10-CM | POA: Diagnosis not present

## 2022-03-16 DIAGNOSIS — M4800 Spinal stenosis, site unspecified: Secondary | ICD-10-CM | POA: Diagnosis not present

## 2022-03-16 DIAGNOSIS — G4733 Obstructive sleep apnea (adult) (pediatric): Secondary | ICD-10-CM | POA: Diagnosis not present

## 2022-03-16 DIAGNOSIS — Z86010 Personal history of colonic polyps: Secondary | ICD-10-CM | POA: Diagnosis not present

## 2022-03-16 DIAGNOSIS — I10 Essential (primary) hypertension: Secondary | ICD-10-CM | POA: Diagnosis not present

## 2022-03-16 DIAGNOSIS — Z87891 Personal history of nicotine dependence: Secondary | ICD-10-CM | POA: Diagnosis not present

## 2022-03-16 DIAGNOSIS — Z9181 History of falling: Secondary | ICD-10-CM | POA: Diagnosis not present

## 2022-03-16 DIAGNOSIS — M87852 Other osteonecrosis, left femur: Secondary | ICD-10-CM | POA: Diagnosis not present

## 2022-03-16 DIAGNOSIS — Z7982 Long term (current) use of aspirin: Secondary | ICD-10-CM | POA: Diagnosis not present

## 2022-03-16 DIAGNOSIS — M160 Bilateral primary osteoarthritis of hip: Secondary | ICD-10-CM | POA: Diagnosis not present

## 2022-03-18 ENCOUNTER — Encounter: Payer: Self-pay | Admitting: Orthopaedic Surgery

## 2022-03-18 ENCOUNTER — Ambulatory Visit (INDEPENDENT_AMBULATORY_CARE_PROVIDER_SITE_OTHER): Payer: Medicare Other | Admitting: Orthopaedic Surgery

## 2022-03-18 ENCOUNTER — Other Ambulatory Visit: Payer: Self-pay

## 2022-03-18 VITALS — Ht 66.0 in | Wt 129.2 lb

## 2022-03-18 DIAGNOSIS — M1612 Unilateral primary osteoarthritis, left hip: Secondary | ICD-10-CM

## 2022-03-18 DIAGNOSIS — M87051 Idiopathic aseptic necrosis of right femur: Secondary | ICD-10-CM

## 2022-03-18 DIAGNOSIS — M87052 Idiopathic aseptic necrosis of left femur: Secondary | ICD-10-CM

## 2022-03-18 DIAGNOSIS — M1611 Unilateral primary osteoarthritis, right hip: Secondary | ICD-10-CM | POA: Diagnosis not present

## 2022-03-18 NOTE — Progress Notes (Signed)
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  He is someone who has had progressive weakness over the last 2 years.  He does have a history of CLL which is stable.  He is followed by Dr. Alain Marion.  He has had home therapy and Dr. Alain Marion feels that he would benefit from more intensive outpatient therapy and I agree with this as well.  The patient is mainly in a wheelchair and he has been a fall risk quite a bit.  He denies any significant hip pain.  He does take Tylenol 3 times a day.  He is not on blood thinning medications. ? ?Examination of both hips shows significant stiffness with decreased rotation of both hips with some pain in the groin on both sides. ? ?X-rays of the pelvis and both hips were reviewed with the patient and his family.  He has complete loss of joint space on both sides and evidence of advanced osteoarthritis and likely component of osteonecrosis of both hips. ? ?I do feel that he would benefit from short-term aggressive outpatient physical therapy to work on just his balance and coordination in general he has a goal of increasing his mobility which he would need in order to get through hip replacement surgery.  I did talk about hip replacement surgery and showed him a hip replacement model.  I gave him a handout about hip replacement surgery as well.  This is something to consider but I would like to reevaluate him in 4 weeks after course of outpatient physical therapy.  They agree with this as well.  All questions and concerns were answered and addressed. ?

## 2022-03-26 DIAGNOSIS — Z961 Presence of intraocular lens: Secondary | ICD-10-CM | POA: Diagnosis not present

## 2022-03-26 DIAGNOSIS — H18421 Band keratopathy, right eye: Secondary | ICD-10-CM | POA: Diagnosis not present

## 2022-03-26 DIAGNOSIS — H35373 Puckering of macula, bilateral: Secondary | ICD-10-CM | POA: Diagnosis not present

## 2022-03-27 DIAGNOSIS — L821 Other seborrheic keratosis: Secondary | ICD-10-CM | POA: Diagnosis not present

## 2022-03-27 DIAGNOSIS — Z85828 Personal history of other malignant neoplasm of skin: Secondary | ICD-10-CM | POA: Diagnosis not present

## 2022-03-27 DIAGNOSIS — D485 Neoplasm of uncertain behavior of skin: Secondary | ICD-10-CM | POA: Diagnosis not present

## 2022-03-27 DIAGNOSIS — L853 Xerosis cutis: Secondary | ICD-10-CM | POA: Diagnosis not present

## 2022-03-27 DIAGNOSIS — D044 Carcinoma in situ of skin of scalp and neck: Secondary | ICD-10-CM | POA: Diagnosis not present

## 2022-04-02 NOTE — Therapy (Signed)
?OUTPATIENT PHYSICAL THERAPY LOWER EXTREMITY EVALUATION ? ? ?Patient Name: Jerry Gomez ?MRN: [id number] ?DOB:01-01-1931, 88 y.o., male ?Today's Date: 04/03/2022 ? ? PT End of Session - 04/03/22 1505   ? ? Visit Number 1   ? Date for PT Re-Evaluation 05/29/22   ? Authorization Type Medicare   ? Progress Note Due on Visit 10   ? PT Start Time 8413   late  ? PT Stop Time 1450   ? PT Time Calculation (min) 33 min   ? Activity Tolerance Patient tolerated treatment well   ? Behavior During Therapy [facility] for tasks assessed/performed   ? ?  ?  ? ?  ? ? ?Past Medical History:  ?Diagnosis Date  ? Cancer [facility])   ? CLL  ? Cataract   ? COLONIC POLYPS 03/02/2009  ? EYE SURGERY, HX OF 09/05/2007  ? HEMORRHOIDS, HX OF   ? HYPERLIPIDEMIA 09/05/2007  ? HYPERTENSION 07/25/2007  ? OSTEOARTHRITIS 09/05/2007  ? SLEEP APNEA, OBSTRUCTIVE 09/05/2007  ? ?Past Surgical History:  ?Procedure Laterality Date  ? EYE SURGERY  10/22/13  ? left eye, cataract  ? ?Patient Active Problem List  ? Diagnosis Date Noted  ? Somnolence, daytime 02/24/2022  ? Falls 01/31/2022  ? Orthostatic hypotension 01/18/2022  ? Difficulty walking with leg weakness  01/10/2022  ? Avascular necrosis of bones of both hips with severe OA of bilateral hips  01/10/2022  ? Bilateral lower extremity edema 01/10/2022  ? Leg weakness, bilateral 08/22/2021  ? Spinal stenosis 03/27/2021  ? Lumbar radiculopathy 03/12/2021  ? Acute left-sided low back pain with left-sided sciatica 02/21/2021  ? Insomnia 10/28/2018  ? Bradycardia 10/09/2016  ? Sebaceous cyst 10/09/2016  ? Band keratopathy of right eye 07/02/2016  ? Corneal dellen of right eye 07/02/2016  ? Carotid bruit 04/08/2016  ? Acute sinusitis 11/01/2015  ? Elevated PSA 04/10/2015  ? Well adult exam 10/10/2014  ? Bruising 10/10/2014  ? PCO (posterior capsular opacification) 10/27/2013  ? Nuclear cataract 07/29/2012  ? Pseudophakia 07/29/2012  ? CLL (chronic lymphocytic leukemia) ([place]) 06/09/2012  ? Cystoid macular edema 06/03/2012  ?  Epiretinal membrane 06/03/2012  ? Edema 02/07/2012  ? Pain of right lower leg 02/07/2012  ? Dyslipidemia 09/05/2007  ? SLEEP APNEA, OBSTRUCTIVE 09/05/2007  ? OSTEOARTHRITIS 09/05/2007  ? Essential hypertension 07/25/2007  ? ? ?PCP: [name], [name], MD ? ?REFERRING PROVIDER: Mcarthur Rossetti* ? ?REFERRING DIAG:  ?M16.12 (ICD-10-CM) - Unilateral primary osteoarthritis, left hip  ?M16.11 (ICD-10-CM) - Unilateral primary osteoarthritis, right hip  ? ? ?THERAPY DIAG:  ?Muscle weakness (generalized) - Plan: PT plan of care cert/re-cert ? ?Other abnormalities of gait and mobility - Plan: PT plan of care cert/re-cert ? ?ONSET DATE: chronic  ? ?SUBJECTIVE:  ? ?SUBJECTIVE STATEMENT: ?Pt presents to PT with bil hip pain, advanced OA and AVN and has had progressive weakness over the past 2 years.  He has had PT at home and is in need of more advanced therapy at this time. MD would like pt to have outpatient physical therapy to work on his balance and coordination in general he has a goal of increasing his mobility which he would need in order to get through hip replacement surgery. Pt reports Rt hip is the worst hip and will do this first if he does the surgery. Main complaint today is weakness.  ? ?PERTINENT HISTORY: ?Significant OA and AVN in bil hips. leukemia, HTN ? ?PAIN:  ?Are you having pain? Yes: NPRS scale: 1-2/10 ?Pain location:  bil hips  ?Pain description: sore/stiff ?Aggravating factors: standing and walking ?Relieving factors: Tylenol, rest ? ?PRECAUTIONS: Fall ? ?WEIGHT BEARING RESTRICTIONS No ? ?FALLS:  ?Has patient fallen in last 6 months? Yes. Number of falls 2 ?In the bathroom at home- PT will address falls  ? ?LIVING ENVIRONMENT: ?Lives with: lives with their family ?Lives in: House/apartment ?Stairs: Yes: External: 3 steps; on right going up  2 story home- bedroom upstairs.  Chair lift for steps. ?Has following equipment at home: Single point cane, Walker - 2 wheeled, shower chair, and Grab  bars ? ?OCCUPATION: retired  ? ?PLOF: Requires assistive device for independence ? ?PATIENT GOALS to improve mobility and have hip replacement surgery ?Walking with walker limited to 5-10 min, would like to walk longer ? ? ?OBJECTIVE: from evaluation on 4/256/23 ? ?DIAGNOSTIC FINDINGS: from MD note: He has complete loss of joint space on both sides and evidence of advanced osteoarthritis and likely component of osteonecrosis of both hips. ? ? ?COGNITION: ? Overall cognitive status: Within functional limits for tasks assessed   ?  ?SENSATION: ?WFL ? ?MUSCLE LENGTH: ?Bil hamstring length limited in sitting with reduced knee extension.  ? ?POSTURE:  ?Lt LE is flexed, forward head and flexed trunk.  ? ?PALPATION: ?NA ? ?LE ROM: ? ?Not formally tested.  Pt with reduced hamstring length and IR/ER in sitting- limited by 25-50%  ?LE MMT: ? ?MMT Right ?04/03/2022 Left ?04/03/2022  ?Hip flexion 4/5 4/5  ?Hip extension    ?Hip abduction 4-/5 4-/5  ?Hip adduction 4/5 4/5  ?Hip internal rotation    ?Hip external rotation    ?Knee flexion 4+/5 4+/5  ?Knee extension 4/5 4+/5  ?Ankle dorsiflexion 4+/5 4+/5  ?Ankle plantarflexion    ?Ankle inversion    ?Ankle eversion    ? (Blank rows = not tested) ? ? ?FUNCTIONAL TESTS:  ?5 times sit to stand: 31 seconds with use of arms ?Timed up and go (TUG): 1 min, 12 seconds with rolling walker  ? ?GAIT: ?Distance walked: 25 ?Assistive device utilized: Environmental consultant - 2 wheeled ?Level of assistance: CGA ?Comments: Reduced step length and toe touch weight bearing on the Lt LE, mod UE support on rolling walker ? ?TODAY'S TREATMENT: ?Treatment on date: 04/03/22 ?HEP established- see below ?Advised pt to walk frequently at home with walker  ? ? ?PATIENT EDUCATION:  ?Education details: Access Code: [email or web address] ?Person educated: Patient and Spouse ?Education method: Explanation, Demonstration, and Handouts ?Education comprehension: verbalized understanding and returned demonstration ? ? ?HOME EXERCISE  PROGRAM: ?Access Code: [email or web address] ?URL: [email or web address] ?Date: 04/03/2022 ?Prepared by: Claiborne Billings ? ?Exercises ?- Seated Long Arc Quad  - 3 x daily - 7 x weekly - 2 sets - 10 reps - 5 hold ?- Seated March   - 3 x daily - 7 x weekly - 3 sets - 10 reps ?- Seated Heel Raise  - 3 x daily - 7 x weekly - 2 sets - 10 reps ?- Sit to Stand with Armchair  - 2 x daily - 7 x weekly - 2 sets - 10 reps ? ?Patient Education ?- Walking with a Chartered certified accountant  ? ?ASSESSMENT: ? ?CLINICAL IMPRESSION: ?Patient is a 88 y.o. male who was seen today for physical therapy evaluation and treatment for bil hip OA and immobility/weakness. Pt uses a rolling walker and cane for household distances and wheelchair for community distances due to weakness and balance deficits.  Pt with reduced hamstring length bil, decreased hip and knee strength and slow  mobility with TUG and 5x sit to stand, indicating falls risk.  Pt with reduced step length bilaterally with rolling walker and toe touch weight bearing on the Lt with reduced knee extension in stance phase of gait.  Patient will benefit from skilled PT to address the below impairments and improve overall function.  ? ? ?OBJECTIVE IMPAIRMENTS Abnormal gait, decreased activity tolerance, decreased balance, decreased endurance, decreased mobility, difficulty walking, decreased ROM, decreased strength, hypomobility, impaired flexibility, and pain.  ? ?ACTIVITY LIMITATIONS community activity, driving, and meal prep.  ? ?PERSONAL FACTORS Age and 1-2 comorbidities: bil hip OA, weakness, 2 falls   are also affecting patient's functional outcome.  ? ? ?REHAB POTENTIAL: Good ? ?CLINICAL DECISION MAKING: Stable/uncomplicated ? ?EVALUATION COMPLEXITY: Low ? ? ?GOALS: ?Goals reviewed with patient? Yes ? ?SHORT TERM GOALS: Target date: 05/01/22 ? ?Be independent in initial HEP ?Baseline: ?Goal status: INITIAL ? ?2.  Perform 5x sit to stand in < or = to 21 seconds with use of arms to reduce falls  risk. ?Baseline: 31 ?Goal status: INITIAL ? ?3.  Demonstrate Lt foot flat in stance phase of gait > or = to 50% of the time with rolling walker  ?Baseline:  ?Goal status: INITIAL ? ?4.  Perform TUG in < or = to 55 seconds t

## 2022-04-03 ENCOUNTER — Ambulatory Visit: Payer: Medicare Other | Attending: Orthopaedic Surgery

## 2022-04-03 DIAGNOSIS — M1612 Unilateral primary osteoarthritis, left hip: Secondary | ICD-10-CM | POA: Insufficient documentation

## 2022-04-03 DIAGNOSIS — R2689 Other abnormalities of gait and mobility: Secondary | ICD-10-CM | POA: Insufficient documentation

## 2022-04-03 DIAGNOSIS — M6281 Muscle weakness (generalized): Secondary | ICD-10-CM | POA: Insufficient documentation

## 2022-04-03 DIAGNOSIS — M1611 Unilateral primary osteoarthritis, right hip: Secondary | ICD-10-CM | POA: Insufficient documentation

## 2022-04-09 DIAGNOSIS — I10 Essential (primary) hypertension: Secondary | ICD-10-CM | POA: Diagnosis not present

## 2022-04-09 DIAGNOSIS — M4800 Spinal stenosis, site unspecified: Secondary | ICD-10-CM | POA: Diagnosis not present

## 2022-04-09 DIAGNOSIS — G3184 Mild cognitive impairment, so stated: Secondary | ICD-10-CM | POA: Diagnosis not present

## 2022-04-09 DIAGNOSIS — M87852 Other osteonecrosis, left femur: Secondary | ICD-10-CM | POA: Diagnosis not present

## 2022-04-09 DIAGNOSIS — M87851 Other osteonecrosis, right femur: Secondary | ICD-10-CM | POA: Diagnosis not present

## 2022-04-09 DIAGNOSIS — M160 Bilateral primary osteoarthritis of hip: Secondary | ICD-10-CM | POA: Diagnosis not present

## 2022-04-11 ENCOUNTER — Ambulatory Visit: Payer: Medicare Other | Attending: Orthopaedic Surgery

## 2022-04-11 DIAGNOSIS — M5442 Lumbago with sciatica, left side: Secondary | ICD-10-CM | POA: Insufficient documentation

## 2022-04-11 DIAGNOSIS — R2689 Other abnormalities of gait and mobility: Secondary | ICD-10-CM | POA: Insufficient documentation

## 2022-04-11 DIAGNOSIS — R293 Abnormal posture: Secondary | ICD-10-CM | POA: Diagnosis not present

## 2022-04-11 DIAGNOSIS — M6281 Muscle weakness (generalized): Secondary | ICD-10-CM | POA: Insufficient documentation

## 2022-04-11 NOTE — Therapy (Signed)
?OUTPATIENT PHYSICAL THERAPY TREATMENT NOTE ? ? ?Patient Name: Jerry Gomez ?MRN: [id number] ?DOB:14-Aug-1931, 88 y.o., male ?Today's Date: 04/11/2022 ? ?PCP: [name], [name], MD ?REFERRING PROVIDER: Mcarthur Rossetti, MD ? ?END OF SESSION:  ? PT End of Session - 04/11/22 1541   ? ? Visit Number 2   ? Date for PT Re-Evaluation 05/29/22   ? Authorization Type Medicare   ? Progress Note Due on Visit 10   ? PT Start Time 1535   ? PT Stop Time 1620   ? PT Time Calculation (min) 45 min   ? Activity Tolerance Patient tolerated treatment well   ? Behavior During Therapy [facility] for tasks assessed/performed   ? ?  ?  ? ?  ? ? ?Past Medical History:  ?Diagnosis Date  ? Cancer [facility])   ? CLL  ? Cataract   ? COLONIC POLYPS 03/02/2009  ? EYE SURGERY, HX OF 09/05/2007  ? HEMORRHOIDS, HX OF   ? HYPERLIPIDEMIA 09/05/2007  ? HYPERTENSION 07/25/2007  ? OSTEOARTHRITIS 09/05/2007  ? SLEEP APNEA, OBSTRUCTIVE 09/05/2007  ? ?Past Surgical History:  ?Procedure Laterality Date  ? EYE SURGERY  10/22/13  ? left eye, cataract  ? ?Patient Active Problem List  ? Diagnosis Date Noted  ? Somnolence, daytime 02/24/2022  ? Falls 01/31/2022  ? Orthostatic hypotension 01/18/2022  ? Difficulty walking with leg weakness  01/10/2022  ? Avascular necrosis of bones of both hips with severe OA of bilateral hips  01/10/2022  ? Bilateral lower extremity edema 01/10/2022  ? Leg weakness, bilateral 08/22/2021  ? Spinal stenosis 03/27/2021  ? Lumbar radiculopathy 03/12/2021  ? Acute left-sided low back pain with left-sided sciatica 02/21/2021  ? Insomnia 10/28/2018  ? Bradycardia 10/09/2016  ? Sebaceous cyst 10/09/2016  ? Band keratopathy of right eye 07/02/2016  ? Corneal dellen of right eye 07/02/2016  ? Carotid bruit 04/08/2016  ? Acute sinusitis 11/01/2015  ? Elevated PSA 04/10/2015  ? Well adult exam 10/10/2014  ? Bruising 10/10/2014  ? PCO (posterior capsular opacification) 10/27/2013  ? Nuclear cataract 07/29/2012  ? Pseudophakia 07/29/2012  ? CLL (chronic  lymphocytic leukemia) ([place]) 06/09/2012  ? Cystoid macular edema 06/03/2012  ? Epiretinal membrane 06/03/2012  ? Edema 02/07/2012  ? Pain of right lower leg 02/07/2012  ? Dyslipidemia 09/05/2007  ? SLEEP APNEA, OBSTRUCTIVE 09/05/2007  ? OSTEOARTHRITIS 09/05/2007  ? Essential hypertension 07/25/2007  ? ? ?REFERRING DIAG:  ?M16.12 (ICD-10-CM) - Unilateral primary osteoarthritis, left hip  ?M16.11 (ICD-10-CM) - Unilateral primary osteoarthritis, right hip  ? ? ?THERAPY DIAG:  ?Muscle weakness (generalized) ? ?Other abnormalities of gait and mobility ? ?Abnormal posture ? ?Acute left-sided low back pain with left-sided sciatica ? ?PERTINENT HISTORY: See above ? ?PRECAUTIONS: fall ? ?SUBJECTIVE: Patient states he is doing ok.  He is ready to try some exercise.  Wife is present throughout session.   ? ?PAIN:  ?Are you having pain? No ? ? ?OBJECTIVE: (objective measures completed at initial evaluation unless otherwise dated) ? ? ?OBJECTIVE: from evaluation on 4/256/23 ? ?DIAGNOSTIC FINDINGS: from MD note: He has complete loss of joint space on both sides and evidence of advanced osteoarthritis and likely component of osteonecrosis of both hips. ? ? ?COGNITION: ? Overall cognitive status: Within functional limits for tasks assessed   ?  ?SENSATION: ?WFL ? ?MUSCLE LENGTH: ?Bil hamstring length limited in sitting with reduced knee extension.  ? ?POSTURE:  ?Lt LE is flexed, forward head and flexed trunk.  ? ?PALPATION: ?NA ? ?LE  ROM: ? ?Not formally tested.  Pt with reduced hamstring length and IR/ER in sitting- limited by 25-50%  ?LE MMT: ? ?MMT Right ?04/11/2022 Left ?04/11/2022  ?Hip flexion 4/5 4/5  ?Hip extension    ?Hip abduction 4-/5 4-/5  ?Hip adduction 4/5 4/5  ?Hip internal rotation    ?Hip external rotation    ?Knee flexion 4+/5 4+/5  ?Knee extension 4/5 4+/5  ?Ankle dorsiflexion 4+/5 4+/5  ?Ankle plantarflexion    ?Ankle inversion    ?Ankle eversion    ? (Blank rows = not tested) ? ? ?FUNCTIONAL TESTS:  ?5 times sit to  stand: 31 seconds with use of arms ?Timed up and go (TUG): 1 min, 12 seconds with rolling walker  ? ?GAIT: ?Distance walked: 25 ?Assistive device utilized: Environmental consultant - 2 wheeled ?Level of assistance: CGA ?Comments: Reduced step length and toe touch weight bearing on the Lt LE, mod UE support on rolling walker ? ?TODAY'S TREATMENT: ?Treatment on date: 04/11/22 ?Seated toe and heel raises x 20 ?Seated LAQ x 20 both ?Seated march x 20 both ?Seated hip ER  x 10 both  ?Seated hip IR with ball between knees and green loop around ankles ?Seated clam with green loop x 20 ?Sit to stand x 5 with sba ? ?Treatment on date: 04/03/22 ?HEP established- see below ?Advised pt to walk frequently at home with walker  ? ? ?PATIENT EDUCATION:  ?Education details: Access Code: [email or web address] ?Person educated: Patient and Spouse ?Education method: Explanation, Demonstration, and Handouts ?Education comprehension: verbalized understanding and returned demonstration ? ? ?HOME EXERCISE PROGRAM: ?Access Code: [email or web address] ?URL: [email or web address] ?Date: 04/03/2022 ?Prepared by: Claiborne Billings ? ?Exercises ?- Seated Long Arc Quad  - 3 x daily - 7 x weekly - 2 sets - 10 reps - 5 hold ?- Seated March   - 3 x daily - 7 x weekly - 3 sets - 10 reps ?- Seated Heel Raise  - 3 x daily - 7 x weekly - 2 sets - 10 reps ?- Sit to Stand with Armchair  - 2 x daily - 7 x weekly - 2 sets - 10 reps ? ?Patient Education ?- Walking with a Chartered certified accountant  ? ?ASSESSMENT: ? ?CLINICAL IMPRESSION: ?  Patient was able to do all above exercises with minimal complaints of pain but several exercises with severe bilateral hip crepitus.  Jerry Gomez is very determined and upper body strength is excellent.  He should respond well to bilateral LE strengthening along with functional training.  Patient will benefit from skilled PT to address these impairments and possibly become candidate for THA's.    ? ? ?OBJECTIVE IMPAIRMENTS Abnormal gait, decreased activity tolerance, decreased  balance, decreased endurance, decreased mobility, difficulty walking, decreased ROM, decreased strength, hypomobility, impaired flexibility, and pain.  ? ?ACTIVITY LIMITATIONS community activity, driving, and meal prep.  ? ?PERSONAL FACTORS Age and 1-2 comorbidities: bil hip OA, weakness, 2 falls  are also affecting patient's functional outcome.  ? ? ?REHAB POTENTIAL: Good ? ?CLINICAL DECISION MAKING: Stable/uncomplicated ? ?EVALUATION COMPLEXITY: Low ? ? ?GOALS: ?Goals reviewed with patient? Yes ? ?SHORT TERM GOALS: Target date: 05/01/22 ? ?Be independent in initial HEP ?Baseline: ?Goal status: INITIAL ? ?2.  Perform 5x sit to stand in < or = to 21 seconds with use of arms to reduce falls risk. ?Baseline: 31 ?Goal status: INITIAL ? ?3.  Demonstrate Lt foot flat in stance phase of gait > or = to 50% of the time with rolling walker  ?Baseline:  ?  Goal status: INITIAL ? ?4.  Perform TUG in < or = to 55 seconds to reduce falls risk ?Baseline:  ?Goal status: INITIAL ? ? ? ?LONG TERM GOALS: Target date: 05/29/22 ? ?Be independent in advanced HEP ?Baseline:  ?Goal status: INITIAL ? ?2.  Perform 5x sit to stand in < or = to 18 seconds with use of hands to reduce falls risk ?Baseline: 31 seconds  ?Goal status: INITIAL ? ?3.  Perform TUG in < or = to 45 seconds to reduce falls risk ?Baseline:  1 min, 15 seconds  ?Goal status: INITIAL ? ?4.  Improve LE strength to demonstrate symmetry with gait on level surface with use of rolling walker with min to mod UE support on rolling walker  ?Baseline:  ?Goal status: INITIAL ? ?5.  Improve strength and endurance to stand for home tasks x 10-20 minutes without significant fatigue ?Baseline:  ?Goal status: INITIAL ? ? ? ? ? ?PLAN: ?PT FREQUENCY: 2x/week ? ?PT DURATION: 8 weeks ? ?PLANNED INTERVENTIONS: Therapeutic exercises, Therapeutic activity, Neuromuscular re-education, Balance training, Gait training, Patient/Family education, Joint mobilization, Stair training, Aquatic Therapy, Dry  Needling, Electrical stimulation, Wheelchair mobility training, Cryotherapy, Moist heat, Taping, and Manual therapy ? ?PLAN FOR NEXT SESSION: continue to work on mobility and strength, work on symmetry a

## 2022-04-16 ENCOUNTER — Ambulatory Visit: Payer: Medicare Other

## 2022-04-16 DIAGNOSIS — M5442 Lumbago with sciatica, left side: Secondary | ICD-10-CM | POA: Diagnosis not present

## 2022-04-16 DIAGNOSIS — R293 Abnormal posture: Secondary | ICD-10-CM | POA: Diagnosis not present

## 2022-04-16 DIAGNOSIS — R2689 Other abnormalities of gait and mobility: Secondary | ICD-10-CM | POA: Diagnosis not present

## 2022-04-16 DIAGNOSIS — M6281 Muscle weakness (generalized): Secondary | ICD-10-CM

## 2022-04-16 NOTE — Therapy (Signed)
?OUTPATIENT PHYSICAL THERAPY TREATMENT NOTE ? ? ?Patient Name: Jerry Gomez ?MRN: [id number] ?DOB:June 29, 1931, 88 y.o., male ?Today's Date: 04/16/2022 ? ?PCP: [name], [name], MD ?REFERRING PROVIDER: Mcarthur Rossetti, MD ? ?END OF SESSION:  ? PT End of Session - 04/16/22 1501   ? ? Visit Number 3   ? Date for PT Re-Evaluation 05/29/22   ? Authorization Type Medicare   ? Progress Note Due on Visit 10   ? PT Start Time 1455   ? PT Stop Time 1530   ? PT Time Calculation (min) 35 min   ? Activity Tolerance Patient tolerated treatment well   ? Behavior During Therapy [facility] for tasks assessed/performed   ? ?  ?  ? ?  ? ? ?Past Medical History:  ?Diagnosis Date  ? Cancer [facility])   ? CLL  ? Cataract   ? COLONIC POLYPS 03/02/2009  ? EYE SURGERY, HX OF 09/05/2007  ? HEMORRHOIDS, HX OF   ? HYPERLIPIDEMIA 09/05/2007  ? HYPERTENSION 07/25/2007  ? OSTEOARTHRITIS 09/05/2007  ? SLEEP APNEA, OBSTRUCTIVE 09/05/2007  ? ?Past Surgical History:  ?Procedure Laterality Date  ? EYE SURGERY  10/22/13  ? left eye, cataract  ? ?Patient Active Problem List  ? Diagnosis Date Noted  ? Somnolence, daytime 02/24/2022  ? Falls 01/31/2022  ? Orthostatic hypotension 01/18/2022  ? Difficulty walking with leg weakness  01/10/2022  ? Avascular necrosis of bones of both hips with severe OA of bilateral hips  01/10/2022  ? Bilateral lower extremity edema 01/10/2022  ? Leg weakness, bilateral 08/22/2021  ? Spinal stenosis 03/27/2021  ? Lumbar radiculopathy 03/12/2021  ? Acute left-sided low back pain with left-sided sciatica 02/21/2021  ? Insomnia 10/28/2018  ? Bradycardia 10/09/2016  ? Sebaceous cyst 10/09/2016  ? Band keratopathy of right eye 07/02/2016  ? Corneal dellen of right eye 07/02/2016  ? Carotid bruit 04/08/2016  ? Acute sinusitis 11/01/2015  ? Elevated PSA 04/10/2015  ? Well adult exam 10/10/2014  ? Bruising 10/10/2014  ? PCO (posterior capsular opacification) 10/27/2013  ? Nuclear cataract 07/29/2012  ? Pseudophakia 07/29/2012  ? CLL (chronic  lymphocytic leukemia) ([place]) 06/09/2012  ? Cystoid macular edema 06/03/2012  ? Epiretinal membrane 06/03/2012  ? Edema 02/07/2012  ? Pain of right lower leg 02/07/2012  ? Dyslipidemia 09/05/2007  ? SLEEP APNEA, OBSTRUCTIVE 09/05/2007  ? OSTEOARTHRITIS 09/05/2007  ? Essential hypertension 07/25/2007  ? ? ?REFERRING DIAG:  ?M16.12 (ICD-10-CM) - Unilateral primary osteoarthritis, left hip  ?M16.11 (ICD-10-CM) - Unilateral primary osteoarthritis, right hip  ? ? ?THERAPY DIAG:  ?Muscle weakness (generalized) ? ?Other abnormalities of gait and mobility ? ?Abnormal posture ? ?Acute left-sided low back pain with left-sided sciatica ? ?PERTINENT HISTORY: See above ? ?PRECAUTIONS: fall ? ?SUBJECTIVE: Patient states he is doing ok.  He is ready to try some exercise.  Wife is present throughout session.   ? ?PAIN:  ?Are you having pain? No ? ? ?OBJECTIVE: (objective measures completed at initial evaluation unless otherwise dated) ? ? ?OBJECTIVE: from evaluation on 4/256/23 ? ?DIAGNOSTIC FINDINGS: from MD note: He has complete loss of joint space on both sides and evidence of advanced osteoarthritis and likely component of osteonecrosis of both hips. ? ? ?COGNITION: ? Overall cognitive status: Within functional limits for tasks assessed   ?  ?SENSATION: ?WFL ? ?MUSCLE LENGTH: ?Bil hamstring length limited in sitting with reduced knee extension.  ? ?POSTURE:  ?Lt LE is flexed, forward head and flexed trunk.  ? ?PALPATION: ?NA ? ?LE  ROM: ? ?Not formally tested.  Pt with reduced hamstring length and IR/ER in sitting- limited by 25-50%  ?LE MMT: ? ?MMT Right ?04/11/2022 Left ?04/11/2022  ?Hip flexion 4/5 4/5  ?Hip extension    ?Hip abduction 4-/5 4-/5  ?Hip adduction 4/5 4/5  ?Hip internal rotation    ?Hip external rotation    ?Knee flexion 4+/5 4+/5  ?Knee extension 4/5 4+/5  ?Ankle dorsiflexion 4+/5 4+/5  ?Ankle plantarflexion    ?Ankle inversion    ?Ankle eversion    ? (Blank rows = not tested) ? ? ?FUNCTIONAL TESTS:  ?5 times sit to  stand: 31 seconds with use of arms ?Timed up and go (TUG): 1 min, 12 seconds with rolling walker  ? ?GAIT: ?Distance walked: 25 ?Assistive device utilized: Environmental consultant - 2 wheeled ?Level of assistance: CGA ?Comments: Reduced step length and toe touch weight bearing on the Lt LE, mod UE support on rolling walker ? ?TODAY'S TREATMENT: ?Treatment on date: 04/16/22 ?NuStep x 5 min level 1 ?Seated toe and heel raises x 20 ?Seated LAQ x 20 both ?Seated march x 20 both ?Seated hip ER  x 10 both with ball between knees ?Seated clam with green loop x 20 ?Seated hip IR with ball between knees and green loop around ankles ?Seated hip ER stretch x 10 sec using strap 5 on each side ?Seated hip IR's stretch with foot on stool, pushing knee outward ?Sit to stand x 5 with walker in front  of patient using bilateral UE  ?Instructed patient and spouse on transferring in/out car ? ?Treatment on date: 04/11/22 ?Seated toe and heel raises x 20 ?Seated LAQ x 20 both ?Seated march x 20 both ?Seated hip ER  x 10 both  ?Seated hip IR with ball between knees and green loop around ankles ?Seated clam with green loop x 20 ?Sit to stand x 5 with sba ? ?Treatment on date: 04/03/22 ?HEP established- see below ?Advised pt to walk frequently at home with walker  ? ? ?PATIENT EDUCATION:  ?Education details: Access Code: [email or web address] ?Person educated: Patient and Spouse ?Education method: Explanation, Demonstration, and Handouts ?Education comprehension: verbalized understanding and returned demonstration ? ? ?HOME EXERCISE PROGRAM: ?Access Code: [email or web address] ?URL: [email or web address] ?Date: 04/03/2022 ?Prepared by: Claiborne Billings ? ?Exercises ?- Seated Long Arc Quad  - 3 x daily - 7 x weekly - 2 sets - 10 reps - 5 hold ?- Seated March   - 3 x daily - 7 x weekly - 3 sets - 10 reps ?- Seated Heel Raise  - 3 x daily - 7 x weekly - 2 sets - 10 reps ?- Sit to Stand with Armchair  - 2 x daily - 7 x weekly - 2 sets - 10 reps ? ?Patient Education ?- Walking with a  Chartered certified accountant  ? ?ASSESSMENT: ? ?CLINICAL IMPRESSION: ?  Patient was able to tolerate new exercises with min pain.  He had no increase in pain post treatment.    Patient will benefit from skilled PT to address these impairments and possibly become candidate for THA's.    ? ? ?OBJECTIVE IMPAIRMENTS Abnormal gait, decreased activity tolerance, decreased balance, decreased endurance, decreased mobility, difficulty walking, decreased ROM, decreased strength, hypomobility, impaired flexibility, and pain.  ? ?ACTIVITY LIMITATIONS community activity, driving, and meal prep.  ? ?PERSONAL FACTORS Age and 1-2 comorbidities: bil hip OA, weakness, 2 falls  are also affecting patient's functional outcome.  ? ? ?REHAB POTENTIAL: Good ? ?CLINICAL DECISION MAKING: Stable/uncomplicated ? ?EVALUATION  COMPLEXITY: Low ? ? ?GOALS: ?Goals reviewed with patient? Yes ? ?SHORT TERM GOALS: Target date: 05/01/22 ? ?Be independent in initial HEP ?Baseline: ?Goal status: INITIAL ? ?2.  Perform 5x sit to stand in < or = to 21 seconds with use of arms to reduce falls risk. ?Baseline: 31 ?Goal status: INITIAL ? ?3.  Demonstrate Lt foot flat in stance phase of gait > or = to 50% of the time with rolling walker  ?Baseline:  ?Goal status: INITIAL ? ?4.  Perform TUG in < or = to 55 seconds to reduce falls risk ?Baseline:  ?Goal status: INITIAL ? ? ? ?LONG TERM GOALS: Target date: 05/29/22 ? ?Be independent in advanced HEP ?Baseline:  ?Goal status: INITIAL ? ?2.  Perform 5x sit to stand in < or = to 18 seconds with use of hands to reduce falls risk ?Baseline: 31 seconds  ?Goal status: INITIAL ? ?3.  Perform TUG in < or = to 45 seconds to reduce falls risk ?Baseline:  1 min, 15 seconds  ?Goal status: INITIAL ? ?4.  Improve LE strength to demonstrate symmetry with gait on level surface with use of rolling walker with min to mod UE support on rolling walker  ?Baseline:  ?Goal status: INITIAL ? ?5.  Improve strength and endurance to stand for home tasks x  10-20 minutes without significant fatigue ?Baseline:  ?Goal status: INITIAL ? ? ? ? ? ?PLAN: ?PT FREQUENCY: 2x/week ? ?PT DURATION: 8 weeks ? ?PLANNED INTERVENTIONS: Therapeutic exercises, Therapeutic act

## 2022-04-18 ENCOUNTER — Ambulatory Visit: Payer: Medicare Other

## 2022-04-18 DIAGNOSIS — M6281 Muscle weakness (generalized): Secondary | ICD-10-CM | POA: Diagnosis not present

## 2022-04-18 DIAGNOSIS — R2689 Other abnormalities of gait and mobility: Secondary | ICD-10-CM

## 2022-04-18 DIAGNOSIS — M5442 Lumbago with sciatica, left side: Secondary | ICD-10-CM

## 2022-04-18 DIAGNOSIS — R293 Abnormal posture: Secondary | ICD-10-CM | POA: Diagnosis not present

## 2022-04-18 NOTE — Therapy (Signed)
?OUTPATIENT PHYSICAL THERAPY TREATMENT NOTE ? ? ?Patient Name: Jerry Gomez ?MRN: [id number] ?DOB:02-27-1931, 88 y.o., male ?Today's Date: 04/19/2022 ? ?PCP: [name], [name], MD ?REFERRING PROVIDER: Mcarthur Rossetti, MD ? ?END OF SESSION:  ? PT End of Session - 04/18/22 1545   ? ? Visit Number 4   ? Date for PT Re-Evaluation 05/29/22   ? Authorization Type Medicare   ? Progress Note Due on Visit 10   ? PT Start Time 7106   ? PT Stop Time 1620   ? PT Time Calculation (min) 42 min   ? Activity Tolerance Patient tolerated treatment well   ? Behavior During Therapy [facility] for tasks assessed/performed   ? ?  ?  ? ?  ? ? ?Past Medical History:  ?Diagnosis Date  ? Cancer [facility])   ? CLL  ? Cataract   ? COLONIC POLYPS 03/02/2009  ? EYE SURGERY, HX OF 09/05/2007  ? HEMORRHOIDS, HX OF   ? HYPERLIPIDEMIA 09/05/2007  ? HYPERTENSION 07/25/2007  ? OSTEOARTHRITIS 09/05/2007  ? SLEEP APNEA, OBSTRUCTIVE 09/05/2007  ? ?Past Surgical History:  ?Procedure Laterality Date  ? EYE SURGERY  10/22/13  ? left eye, cataract  ? ?Patient Active Problem List  ? Diagnosis Date Noted  ? Somnolence, daytime 02/24/2022  ? Falls 01/31/2022  ? Orthostatic hypotension 01/18/2022  ? Difficulty walking with leg weakness  01/10/2022  ? Avascular necrosis of bones of both hips with severe OA of bilateral hips  01/10/2022  ? Bilateral lower extremity edema 01/10/2022  ? Leg weakness, bilateral 08/22/2021  ? Spinal stenosis 03/27/2021  ? Lumbar radiculopathy 03/12/2021  ? Acute left-sided low back pain with left-sided sciatica 02/21/2021  ? Insomnia 10/28/2018  ? Bradycardia 10/09/2016  ? Sebaceous cyst 10/09/2016  ? Band keratopathy of right eye 07/02/2016  ? Corneal dellen of right eye 07/02/2016  ? Carotid bruit 04/08/2016  ? Acute sinusitis 11/01/2015  ? Elevated PSA 04/10/2015  ? Well adult exam 10/10/2014  ? Bruising 10/10/2014  ? PCO (posterior capsular opacification) 10/27/2013  ? Nuclear cataract 07/29/2012  ? Pseudophakia 07/29/2012  ? CLL (chronic  lymphocytic leukemia) ([place]) 06/09/2012  ? Cystoid macular edema 06/03/2012  ? Epiretinal membrane 06/03/2012  ? Edema 02/07/2012  ? Pain of right lower leg 02/07/2012  ? Dyslipidemia 09/05/2007  ? SLEEP APNEA, OBSTRUCTIVE 09/05/2007  ? OSTEOARTHRITIS 09/05/2007  ? Essential hypertension 07/25/2007  ? ? ?REFERRING DIAG:  ?M16.12 (ICD-10-CM) - Unilateral primary osteoarthritis, left hip  ?M16.11 (ICD-10-CM) - Unilateral primary osteoarthritis, right hip  ? ? ?THERAPY DIAG:  ?Muscle weakness (generalized) ? ?Other abnormalities of gait and mobility ? ?Abnormal posture ? ?Acute left-sided low back pain with left-sided sciatica ? ?PERTINENT HISTORY: See above ? ?PRECAUTIONS: fall ? ?SUBJECTIVE: Patient states he did well after last session.  Wife is present throughout session.   ? ?PAIN:  ?Are you having pain? No ? ? ?OBJECTIVE: (objective measures completed at initial evaluation unless otherwise dated) ? ? ?OBJECTIVE: from evaluation on 4/256/23 ? ?DIAGNOSTIC FINDINGS: from MD note: He has complete loss of joint space on both sides and evidence of advanced osteoarthritis and likely component of osteonecrosis of both hips. ? ? ?COGNITION: ? Overall cognitive status: Within functional limits for tasks assessed   ?  ?SENSATION: ?WFL ? ?MUSCLE LENGTH: ?Bil hamstring length limited in sitting with reduced knee extension.  ? ?POSTURE:  ?Lt LE is flexed, forward head and flexed trunk.  ? ?PALPATION: ?NA ? ?LE ROM: ? ?Not formally tested.  Pt with reduced hamstring length and IR/ER in sitting- limited by 25-50%  ?LE MMT: ? ?MMT Right ?04/11/2022 Left ?04/11/2022  ?Hip flexion 4/5 4/5  ?Hip extension    ?Hip abduction 4-/5 4-/5  ?Hip adduction 4/5 4/5  ?Hip internal rotation    ?Hip external rotation    ?Knee flexion 4+/5 4+/5  ?Knee extension 4/5 4+/5  ?Ankle dorsiflexion 4+/5 4+/5  ?Ankle plantarflexion    ?Ankle inversion    ?Ankle eversion    ? (Blank rows = not tested) ? ? ?FUNCTIONAL TESTS:  ?5 times sit to stand: 31 seconds  with use of arms ?Timed up and go (TUG): 1 min, 12 seconds with rolling walker  ? ?GAIT: ?Distance walked: 25 ?Assistive device utilized: Environmental consultant - 2 wheeled ?Level of assistance: CGA ?Comments: Reduced step length and toe touch weight bearing on the Lt LE, mod UE support on rolling walker ? ?TODAY'S TREATMENT: ?Treatment on date: 04/18/22 ?NuStep x 5 min level 2 ?Instructed in Sit to supine and suggested rigid leg lifter strap to self assist with transferring in/out of bed which will also avoid back injuries for wife.  ?Patient positioned on wedge with balance pad to secure and pillow dbld behind his head ?Supine hamstring stretch with strap x 5 each side holding 10 sec each ?Supine heel slides with strap x 10 each ?Supine hip adductor stretch x 5 each side with strap holding 10 sec each ?Hooklying butterfly stretch x 10 hold 10 sec each ?Supine hip rotation/modified piriformis stretch ?Assisted patient out to car and reviewed transfer wc to car. ?Treatment on date: 04/16/22 ?NuStep x 5 min level 1 ?Seated toe and heel raises x 20 ?Seated LAQ x 20 both ?Seated march x 20 both ?Seated hip ER  x 10 both with ball between knees ?Seated clam with green loop x 20 ?Seated hip IR with ball between knees and green loop around ankles ?Seated hip ER stretch x 10 sec using strap 5 on each side ?Seated hip IR's stretch with foot on stool, pushing knee outward ?Sit to stand x 5 with walker in front  of patient using bilateral UE  ?Instructed patient and spouse on transferring in/out car ? ?Treatment on date: 04/11/22 ?Seated toe and heel raises x 20 ?Seated LAQ x 20 both ?Seated march x 20 both ?Seated hip ER  x 10 both  ?Seated hip IR with ball between knees and green loop around ankles ?Seated clam with green loop x 20 ?Sit to stand x 5 with sba ? ? ? ?PATIENT EDUCATION:  ?Education details: Access Code: [email or web address] ?Person educated: Patient and Spouse ?Education method: Explanation, Demonstration, and Handouts ?Education  comprehension: verbalized understanding and returned demonstration ? ? ?HOME EXERCISE PROGRAM: ?Access Code: [email or web address] ?URL: [email or web address] ?Date: 04/03/2022 ?Prepared by: Claiborne Billings ? ?Exercises ?- Seated Long Arc Quad  - 3 x daily - 7 x weekly - 2 sets - 10 reps - 5 hold ?- Seated March   - 3 x daily - 7 x weekly - 3 sets - 10 reps ?- Seated Heel Raise  - 3 x daily - 7 x weekly - 2 sets - 10 reps ?- Sit to Stand with Armchair  - 2 x daily - 7 x weekly - 2 sets - 10 reps ? ?Patient Education ?- Walking with a Chartered certified accountant  ? ?ASSESSMENT: ? ?CLINICAL IMPRESSION: ?  Patient is progressing appropriately.  He was able to tolerate stretches with mod pain.  He is unable to do step through  gait on right LE due to severe right hip degenerative changes.      Patient will benefit from skilled PT to address these impairments and possibly become candidate for THA's.    ? ? ?OBJECTIVE IMPAIRMENTS Abnormal gait, decreased activity tolerance, decreased balance, decreased endurance, decreased mobility, difficulty walking, decreased ROM, decreased strength, hypomobility, impaired flexibility, and pain.  ? ?ACTIVITY LIMITATIONS community activity, driving, and meal prep.  ? ?PERSONAL FACTORS Age and 1-2 comorbidities: bil hip OA, weakness, 2 falls  are also affecting patient's functional outcome.  ? ? ?REHAB POTENTIAL: Good ? ?CLINICAL DECISION MAKING: Stable/uncomplicated ? ?EVALUATION COMPLEXITY: Low ? ? ?GOALS: ?Goals reviewed with patient? Yes ? ?SHORT TERM GOALS: Target date: 05/01/22 ? ?Be independent in initial HEP ?Baseline: ?Goal status: INITIAL ? ?2.  Perform 5x sit to stand in < or = to 21 seconds with use of arms to reduce falls risk. ?Baseline: 31 ?Goal status: INITIAL ? ?3.  Demonstrate Lt foot flat in stance phase of gait > or = to 50% of the time with rolling walker  ?Baseline:  ?Goal status: INITIAL ? ?4.  Perform TUG in < or = to 55 seconds to reduce falls risk ?Baseline:  ?Goal status:  INITIAL ? ? ? ?LONG TERM GOALS: Target date: 05/29/22 ? ?Be independent in advanced HEP ?Baseline:  ?Goal status: INITIAL ? ?2.  Perform 5x sit to stand in < or = to 18 seconds with use of hands to reduce falls risk ?Baseline

## 2022-04-23 ENCOUNTER — Ambulatory Visit: Payer: Medicare Other | Admitting: Internal Medicine

## 2022-04-23 ENCOUNTER — Ambulatory Visit: Payer: Medicare Other

## 2022-04-23 DIAGNOSIS — R293 Abnormal posture: Secondary | ICD-10-CM

## 2022-04-23 DIAGNOSIS — R2689 Other abnormalities of gait and mobility: Secondary | ICD-10-CM | POA: Diagnosis not present

## 2022-04-23 DIAGNOSIS — M6281 Muscle weakness (generalized): Secondary | ICD-10-CM

## 2022-04-23 DIAGNOSIS — M5442 Lumbago with sciatica, left side: Secondary | ICD-10-CM | POA: Diagnosis not present

## 2022-04-23 NOTE — Therapy (Signed)
?OUTPATIENT PHYSICAL THERAPY TREATMENT NOTE ? ? ?Patient Name: Jerry Gomez ?MRN: [id number] ?DOB:[birth year], 88 y.o., male ?Today's Date: 04/23/2022 ? ?PCP: [name], [name], MD ?REFERRING PROVIDER: Mcarthur Rossetti, MD ? ?END OF SESSION:  ? PT End of Session - 04/23/22 1422   ? ? Visit Number 5   ? Date for PT Re-Evaluation 05/29/22   ? Authorization Type Medicare   ? Progress Note Due on Visit 10   ? PT Start Time 1400   ? PT Stop Time 5397   ? PT Time Calculation (min) 45 min   ? Activity Tolerance Patient tolerated treatment well   ? Behavior During Therapy [facility] for tasks assessed/performed   ? ?  ?  ? ?  ? ? ?Past Medical History:  ?Diagnosis Date  ? Cancer [facility])   ? CLL  ? Cataract   ? COLONIC POLYPS 03/02/2009  ? EYE SURGERY, HX OF 09/05/2007  ? HEMORRHOIDS, HX OF   ? HYPERLIPIDEMIA 09/05/2007  ? HYPERTENSION 07/25/2007  ? OSTEOARTHRITIS 09/05/2007  ? SLEEP APNEA, OBSTRUCTIVE 09/05/2007  ? ?Past Surgical History:  ?Procedure Laterality Date  ? EYE SURGERY  10/22/13  ? left eye, cataract  ? ?Patient Active Problem List  ? Diagnosis Date Noted  ? Somnolence, daytime 02/24/2022  ? Falls 01/31/2022  ? Orthostatic hypotension 01/18/2022  ? Difficulty walking with leg weakness  01/10/2022  ? Avascular necrosis of bones of both hips with severe OA of bilateral hips  01/10/2022  ? Bilateral lower extremity edema 01/10/2022  ? Leg weakness, bilateral 08/22/2021  ? Spinal stenosis 03/27/2021  ? Lumbar radiculopathy 03/12/2021  ? Acute left-sided low back pain with left-sided sciatica 02/21/2021  ? Insomnia 10/28/2018  ? Bradycardia 10/09/2016  ? Sebaceous cyst 10/09/2016  ? Band keratopathy of right eye 07/02/2016  ? Corneal dellen of right eye 07/02/2016  ? Carotid bruit 04/08/2016  ? Acute sinusitis 11/01/2015  ? Elevated PSA 04/10/2015  ? Well adult exam 10/10/2014  ? Bruising 10/10/2014  ? PCO (posterior capsular opacification) 10/27/2013  ? Nuclear cataract 07/29/2012  ? Pseudophakia 07/29/2012  ? CLL (chronic  lymphocytic leukemia) ([place]) 06/09/2012  ? Cystoid macular edema 06/03/2012  ? Epiretinal membrane 06/03/2012  ? Edema 02/07/2012  ? Pain of right lower leg 02/07/2012  ? Dyslipidemia 09/05/2007  ? SLEEP APNEA, OBSTRUCTIVE 09/05/2007  ? OSTEOARTHRITIS 09/05/2007  ? Essential hypertension 07/25/2007  ? ? ?REFERRING DIAG:  ?M16.12 (ICD-10-CM) - Unilateral primary osteoarthritis, left hip  ?M16.11 (ICD-10-CM) - Unilateral primary osteoarthritis, right hip  ? ? ?THERAPY DIAG:  ?Muscle weakness (generalized) ? ?Other abnormalities of gait and mobility ? ?Abnormal posture ? ?PERTINENT HISTORY: See above ? ?PRECAUTIONS: fall ? ?SUBJECTIVE: Patient states he did well after last session.  Wife is present throughout session.   ? ?PAIN:  ?Are you having pain? No ? ? ?OBJECTIVE: (objective measures completed at initial evaluation unless otherwise dated) ? ? ?OBJECTIVE: from evaluation on 4/256/23 ? ?DIAGNOSTIC FINDINGS: from MD note: He has complete loss of joint space on both sides and evidence of advanced osteoarthritis and likely component of osteonecrosis of both hips. ? ? ?COGNITION: ? Overall cognitive status: Within functional limits for tasks assessed   ?  ?SENSATION: ?WFL ? ?MUSCLE LENGTH: ?Bil hamstring length limited in sitting with reduced knee extension.  ? ?POSTURE:  ?Lt LE is flexed, forward head and flexed trunk.  ? ?PALPATION: ?NA ? ?LE ROM: ? ?Not formally tested.  Pt with reduced hamstring length and IR/ER in sitting-  limited by 25-50%  ?LE MMT: ? ?MMT Right ?04/11/2022 Left ?04/11/2022  ?Hip flexion 4/5 4/5  ?Hip extension    ?Hip abduction 4-/5 4-/5  ?Hip adduction 4/5 4/5  ?Hip internal rotation    ?Hip external rotation    ?Knee flexion 4+/5 4+/5  ?Knee extension 4/5 4+/5  ?Ankle dorsiflexion 4+/5 4+/5  ?Ankle plantarflexion    ?Ankle inversion    ?Ankle eversion    ? (Blank rows = not tested) ? ? ?FUNCTIONAL TESTS:  ?5 times sit to stand: 31 seconds with use of arms ?Timed up and go (TUG): 1 min, 12 seconds  with rolling walker  ? ?GAIT: ?Distance walked: 25 ?Assistive device utilized: Environmental consultant - 2 wheeled ?Level of assistance: CGA ?Comments: Reduced step length and toe touch weight bearing on the Lt LE, mod UE support on rolling walker ? ?TODAY'S TREATMENT: ?Treatment on date: 04/23/22 ?NuStep x 5 min level 2 ?Seated LAQ x 20 with 2.5 lb ?Seated march x 20 with 2.5 lb ?Seated clam x 20 with green loop ?Instructed in Sit to supine: needed mod assist due to ankle weights on.  ?Patient positioned on wedge with balance pad to secure and pillow dbld behind his head ?Hooklying clam green loop x 20 ?Supine SLR x 10 each with assist to initiate then patient able to do independently ?Supine hamstring stretch with strap x 5 each side holding 10 sec each ?Supine heel slides with strap x 10 each ?Supine hip adductor stretch x 5 each side with strap holding 10 sec each ?Hooklying butterfly stretch x 10 hold 10 sec each ?Gait training with rolling walker 30 feet emphasizing heel strike on left and step through on right.  Patient also needed vc's for stepping too far into walker.  Instructed in transfer into car from walker.   ? ?Treatment on date: 04/18/22 ?NuStep x 5 min level 2 ?Instructed in Sit to supine and suggested rigid leg lifter strap to self assist with transferring in/out of bed which will also avoid back injuries for wife.  ?Patient positioned on wedge with balance pad to secure and pillow dbld behind his head ?Supine hamstring stretch with strap x 5 each side holding 10 sec each ?Supine heel slides with strap x 10 each ?Supine hip adductor stretch x 5 each side with strap holding 10 sec each ?Hooklying butterfly stretch x 10 hold 10 sec each ?Supine hip rotation/modified piriformis stretch ?Assisted patient out to car and reviewed transfer wc to car. ?Treatment on date: 04/16/22 ?NuStep x 5 min level 1 ?Seated toe and heel raises x 20 ?Seated LAQ x 20 both ?Seated march x 20 both ?Seated hip ER  x 10 both with ball between  knees ?Seated clam with green loop x 20 ?Seated hip IR with ball between knees and green loop around ankles ?Seated hip ER stretch x 10 sec using strap 5 on each side ?Seated hip IR's stretch with foot on stool, pushing knee outward ?Sit to stand x 5 with walker in front  of patient using bilateral UE  ?Instructed patient and spouse on transferring in/out car ? ? ? ?PATIENT EDUCATION:  ?Education details: Access Code: [email or web address] ?Person educated: Patient and Spouse ?Education method: Explanation, Demonstration, and Handouts ?Education comprehension: verbalized understanding and returned demonstration ? ? ?HOME EXERCISE PROGRAM: ?Access Code: [email or web address] ?URL: [email or web address] ?Date: 04/03/2022 ?Prepared by: Claiborne Billings ? ?Exercises ?- Seated Long Arc Quad  - 3 x daily - 7 x weekly - 2 sets - 10 reps - 5  hold ?- Seated March   - 3 x daily - 7 x weekly - 3 sets - 10 reps ?- Seated Heel Raise  - 3 x daily - 7 x weekly - 2 sets - 10 reps ?- Sit to Stand with Armchair  - 2 x daily - 7 x weekly - 2 sets - 10 reps ? ?Patient Education ?- Walking with a Chartered certified accountant  ? ?ASSESSMENT: ? ?CLINICAL IMPRESSION: ?Patient is tolerating increased activity each visit.  Right hip is extremely tight and limited.    He is unable to do step through gait on right consistently due to severe right hip degenerative changes.  Right hip with severe crepitus during several activities but patient denies pain.  Patient will benefit from skilled PT to address these impairments and possibly become candidate for THA's.    ? ? ?OBJECTIVE IMPAIRMENTS Abnormal gait, decreased activity tolerance, decreased balance, decreased endurance, decreased mobility, difficulty walking, decreased ROM, decreased strength, hypomobility, impaired flexibility, and pain.  ? ?ACTIVITY LIMITATIONS community activity, driving, and meal prep.  ? ?PERSONAL FACTORS Age and 1-2 comorbidities: bil hip OA, weakness, 2 falls  are also affecting patient's functional  outcome.  ? ? ?REHAB POTENTIAL: Good ? ?CLINICAL DECISION MAKING: Stable/uncomplicated ? ?EVALUATION COMPLEXITY: Low ? ? ?GOALS: ?Goals reviewed with patient? Yes ? ?SHORT TERM GOALS: Target date: 05/01/22

## 2022-04-24 ENCOUNTER — Ambulatory Visit: Payer: Medicare Other | Admitting: Orthopaedic Surgery

## 2022-04-29 ENCOUNTER — Ambulatory Visit: Payer: Medicare Other

## 2022-04-29 DIAGNOSIS — M6281 Muscle weakness (generalized): Secondary | ICD-10-CM

## 2022-04-29 DIAGNOSIS — R293 Abnormal posture: Secondary | ICD-10-CM

## 2022-04-29 DIAGNOSIS — R2689 Other abnormalities of gait and mobility: Secondary | ICD-10-CM

## 2022-04-29 DIAGNOSIS — M5442 Lumbago with sciatica, left side: Secondary | ICD-10-CM | POA: Diagnosis not present

## 2022-04-29 NOTE — Therapy (Signed)
OUTPATIENT PHYSICAL THERAPY TREATMENT NOTE   Patient Name: Jerry Gomez MRN: [id number] DOB:23-Apr-1931, 88 y.o., male Today's Date: 04/29/2022  PCP: Cassandria Anger, MD REFERRING PROVIDER: Mcarthur Rossetti, MD  END OF SESSION:   PT End of Session - 04/29/22 1610     Visit Number 6    Date for PT Re-Evaluation 05/29/22    Authorization Type Medicare    Progress Note Due on Visit 10    PT Start Time 1194    PT Stop Time 1611    PT Time Calculation (min) 40 min    Activity Tolerance Patient tolerated treatment well    Behavior During Therapy WFL for tasks assessed/performed              Past Medical History:  Diagnosis Date   Cancer ([place])    CLL   Cataract    COLONIC POLYPS 03/02/2009   EYE SURGERY, HX OF 09/05/2007   HEMORRHOIDS, HX OF    HYPERLIPIDEMIA 09/05/2007   HYPERTENSION 07/25/2007   OSTEOARTHRITIS 09/05/2007   SLEEP APNEA, OBSTRUCTIVE 09/05/2007   Past Surgical History:  Procedure Laterality Date   EYE SURGERY  10/22/13   left eye, cataract   Patient Active Problem List   Diagnosis Date Noted   Somnolence, daytime 02/24/2022   Falls 01/31/2022   Orthostatic hypotension 01/18/2022   Difficulty walking with leg weakness  01/10/2022   Avascular necrosis of bones of both hips with severe OA of bilateral hips  01/10/2022   Bilateral lower extremity edema 01/10/2022   Leg weakness, bilateral 08/22/2021   Spinal stenosis 03/27/2021   Lumbar radiculopathy 03/12/2021   Acute left-sided low back pain with left-sided sciatica 02/21/2021   Insomnia 10/28/2018   Bradycardia 10/09/2016   Sebaceous cyst 10/09/2016   Band keratopathy of right eye 07/02/2016   Corneal dellen of right eye 07/02/2016   Carotid bruit 04/08/2016   Acute sinusitis 11/01/2015   Elevated PSA 04/10/2015   Well adult exam 10/10/2014   Bruising 10/10/2014   PCO (posterior capsular opacification) 10/27/2013   Nuclear cataract 07/29/2012   Pseudophakia 07/29/2012   CLL  (chronic lymphocytic leukemia) ([place]) 06/09/2012   Cystoid macular edema 06/03/2012   Epiretinal membrane 06/03/2012   Edema 02/07/2012   Pain of right lower leg 02/07/2012   Dyslipidemia 09/05/2007   SLEEP APNEA, OBSTRUCTIVE 09/05/2007   OSTEOARTHRITIS 09/05/2007   Essential hypertension 07/25/2007    REFERRING DIAG:  M16.12 (ICD-10-CM) - Unilateral primary osteoarthritis, left hip  M16.11 (ICD-10-CM) - Unilateral primary osteoarthritis, right hip    THERAPY DIAG:  Muscle weakness (generalized)  Other abnormalities of gait and mobility  Abnormal posture  PERTINENT HISTORY: See above  PRECAUTIONS: fall  SUBJECTIVE: Patient states he did well after last session.  Wife is present throughout session.    PAIN:  Are you having pain? No   OBJECTIVE: (objective measures completed at initial evaluation unless otherwise dated)   OBJECTIVE: from evaluation on 4/256/23  DIAGNOSTIC FINDINGS: from MD note: He has complete loss of joint space on both sides and evidence of advanced osteoarthritis and likely component of osteonecrosis of both hips.   COGNITION:  Overall cognitive status: Within functional limits for tasks assessed     SENSATION: WFL  MUSCLE LENGTH: Bil hamstring length limited in sitting with reduced knee extension.   POSTURE:  Lt LE is flexed, forward head and flexed trunk.   PALPATION: NA  LE ROM:  Not formally tested.  Pt with reduced hamstring length and IR/ER in  sitting- limited by 25-50%  LE MMT:  MMT Right 04/11/2022 Left 04/11/2022  Hip flexion 4/5 4/5  Hip extension    Hip abduction 4-/5 4-/5  Hip adduction 4/5 4/5  Hip internal rotation    Hip external rotation    Knee flexion 4+/5 4+/5  Knee extension 4/5 4+/5  Ankle dorsiflexion 4+/5 4+/5  Ankle plantarflexion    Ankle inversion    Ankle eversion     (Blank rows = not tested)   FUNCTIONAL TESTS:  5 times sit to stand: 31 seconds with use of arms Timed up and go (TUG): 1 min, 12  seconds with rolling walker   04/29/22:  5 times sit to stand: 30 seconds with arms and verbal cues for full erect posture.  Timed up and go (TUG): 1 min, 13 seconds with rolling walker  GAIT: Distance walked: 25 Assistive device utilized: Environmental consultant - 2 wheeled Level of assistance: CGA Comments: Reduced step length and toe touch weight bearing on the Lt LE, mod UE support on rolling walker  TODAY'S TREATMENT: Treatment on date: 04/29/22 NuStep x 7 min level 2- PT present to discuss progress Seated LAQ x 20 with 2.5 lb- verbal cues for quad contraction and full knee extension Seated march x 20 with 2.5 lb Seated clam x 20 with green loop Sit to stand 2x5 with max UE support and verbal cues for glute activation Hamstring curls in sitting: yellow loop x20 each Seated hamstring stretches: 3x20 seconds  Seated ball squeeze: 5" hold x20  Treatment on date: 04/23/22 NuStep x 5 min level 2 Seated LAQ x 20 with 2.5 lb Seated march x 20 with 2.5 lb Seated clam x 20 with green loop Instructed in Sit to supine: needed mod assist due to ankle weights on.  Patient positioned on wedge with balance pad to secure and pillow dbld behind his head Hooklying clam green loop x 20 Supine SLR x 10 each with assist to initiate then patient able to do independently Supine hamstring stretch with strap x 5 each side holding 10 sec each Supine heel slides with strap x 10 each Supine hip adductor stretch x 5 each side with strap holding 10 sec each Hooklying butterfly stretch x 10 hold 10 sec each Gait training with rolling walker 30 feet emphasizing heel strike on left and step through on right.  Patient also needed vc's for stepping too far into walker.  Instructed in transfer into car from walker.    Treatment on date: 04/18/22 NuStep x 5 min level 2 Instructed in Sit to supine and suggested rigid leg lifter strap to self assist with transferring in/out of bed which will also avoid back injuries for wife.   Patient positioned on wedge with balance pad to secure and pillow dbld behind his head Supine hamstring stretch with strap x 5 each side holding 10 sec each Supine heel slides with strap x 10 each Supine hip adductor stretch x 5 each side with strap holding 10 sec each Hooklying butterfly stretch x 10 hold 10 sec each Supine hip rotation/modified piriformis stretch Assisted patient out to car and reviewed transfer wc to car.   PATIENT EDUCATION:  Education details: Access Code: [email or web address] Person educated: Patient and Spouse Education method: Explanation, Demonstration, and Handouts Education comprehension: verbalized understanding and returned demonstration   HOME EXERCISE PROGRAM: Access Code: [email or web address] URL: [email or web address] Date: 04/03/2022 Prepared by: Claiborne Billings  Exercises - Seated Long Arc Quad  - 3 x daily - 7 x weekly -  2 sets - 10 reps - 5 hold - Seated March   - 3 x daily - 7 x weekly - 3 sets - 10 reps - Seated Heel Raise  - 3 x daily - 7 x weekly - 2 sets - 10 reps - Sit to Stand with Armchair  - 2 x daily - 7 x weekly - 2 sets - 10 reps  Patient Education - Walking with a Standard Walker   ASSESSMENT:  CLINICAL IMPRESSION: Pt tolerated increased time on the NuStep  and resistance with hip abduction clams today.  Pt requires verbal cues throughout session to control speed of movement and to activate quads.  No change in TUG time and a min change in 5x sit to stand today.  Pt is challenged with gait and transitions and requires max UE support with this.  Patient will benefit from skilled PT to address these impairments and possibly become candidate for THA's.      OBJECTIVE IMPAIRMENTS Abnormal gait, decreased activity tolerance, decreased balance, decreased endurance, decreased mobility, difficulty walking, decreased ROM, decreased strength, hypomobility, impaired flexibility, and pain.   ACTIVITY LIMITATIONS community activity, driving, and meal prep.    PERSONAL FACTORS Age and 1-2 comorbidities: bil hip OA, weakness, 2 falls  are also affecting patient's functional outcome.    REHAB POTENTIAL: Good  CLINICAL DECISION MAKING: Stable/uncomplicated  EVALUATION COMPLEXITY: Low   GOALS: Goals reviewed with patient? Yes  SHORT TERM GOALS: Target date: 05/01/22  Be independent in initial HEP Baseline: Goal status: MET (04/29/22)  2.  Perform 5x sit to stand in < or = to 21 seconds with use of arms to reduce falls risk. Baseline: 30 (04/29/22) Goal status: In progress   3.  Demonstrate Lt foot flat in stance phase of gait > or = to 50% of the time with rolling walker  Baseline:  Goal status: In progress   4.  Perform TUG in < or = to 55 seconds to reduce falls risk Baseline:  1 min, 13 seconds (04/29/22) Goal status: In progress    LONG TERM GOALS: Target date: 05/29/22  Be independent in advanced HEP Baseline:  Goal status: INITIAL  2.  Perform 5x sit to stand in < or = to 18 seconds with use of hands to reduce falls risk Baseline: 31 seconds  Goal status: INITIAL  3.  Perform TUG in < or = to 45 seconds to reduce falls risk Baseline:  1 min, 15 seconds  Goal status: INITIAL  4.  Improve LE strength to demonstrate symmetry with gait on level surface with use of rolling walker with min to mod UE support on rolling walker  Baseline:  Goal status: INITIAL  5.  Improve strength and endurance to stand for home tasks x 10-20 minutes without significant fatigue Baseline:  Goal status: INITIAL      PLAN: PT FREQUENCY: 2x/week  PT DURATION: 8 weeks  PLANNED INTERVENTIONS: Therapeutic exercises, Therapeutic activity, Neuromuscular re-education, Balance training, Gait training, Patient/Family education, Joint mobilization, Stair training, Aquatic Therapy, Dry Needling, Electrical stimulation, Wheelchair mobility training, Cryotherapy, Moist heat, Taping, and Manual therapy  PLAN FOR NEXT SESSION: continue to work on  mobility and strength, work on Doctor, hospital and endurance, gait and sit to stand  [employer], PT 04/29/22 4:17 PM   [place] [address], [place], [place] [address] Phone # [phone] Fax [phone]

## 2022-04-30 ENCOUNTER — Encounter: Payer: Self-pay | Admitting: Internal Medicine

## 2022-04-30 ENCOUNTER — Telehealth: Payer: Self-pay

## 2022-04-30 ENCOUNTER — Ambulatory Visit (INDEPENDENT_AMBULATORY_CARE_PROVIDER_SITE_OTHER): Payer: Medicare Other | Admitting: Internal Medicine

## 2022-04-30 DIAGNOSIS — C9110 Chronic lymphocytic leukemia of B-cell type not having achieved remission: Secondary | ICD-10-CM | POA: Diagnosis not present

## 2022-04-30 DIAGNOSIS — M5416 Radiculopathy, lumbar region: Secondary | ICD-10-CM

## 2022-04-30 DIAGNOSIS — R400 Somnolence: Secondary | ICD-10-CM

## 2022-04-30 DIAGNOSIS — W19XXXD Unspecified fall, subsequent encounter: Secondary | ICD-10-CM

## 2022-04-30 DIAGNOSIS — R29898 Other symptoms and signs involving the musculoskeletal system: Secondary | ICD-10-CM | POA: Diagnosis not present

## 2022-04-30 LAB — COMPREHENSIVE METABOLIC PANEL
ALT: 14 U/L (ref 0–53)
AST: 19 U/L (ref 0–37)
Albumin: 4.2 g/dL (ref 3.5–5.2)
Alkaline Phosphatase: 81 U/L (ref 39–117)
BUN: 27 mg/dL — ABNORMAL HIGH (ref 6–23)
CO2: 35 mEq/L — ABNORMAL HIGH (ref 19–32)
Calcium: 9.2 mg/dL (ref 8.4–10.5)
Chloride: 101 mEq/L (ref 96–112)
Creatinine, Ser: 0.72 mg/dL (ref 0.40–1.50)
GFR: 81.4 mL/min (ref >60.00)
Glucose, Bld: 84 mg/dL (ref 70–99)
Potassium: 4.4 mEq/L (ref 3.5–5.1)
Sodium: 142 mEq/L (ref 135–145)
Total Bilirubin: 1 mg/dL (ref 0.2–1.2)
Total Protein: 6.3 g/dL (ref 6.0–8.3)

## 2022-04-30 LAB — CBC WITH DIFFERENTIAL/PLATELET
Basophils Absolute: 0.1 10*3/uL (ref 0.0–0.1)
Basophils Relative: 0.2 % (ref 0.0–3.0)
Eosinophils Absolute: 0 10*3/uL (ref 0.0–0.7)
Eosinophils Relative: 0.1 % (ref 0.0–5.0)
HCT: 38.1 % — ABNORMAL LOW (ref 39.0–52.0)
Hemoglobin: 12.6 g/dL — ABNORMAL LOW (ref 13.0–17.0)
Lymphocytes Relative: 90.1 % — ABNORMAL HIGH (ref 12.0–46.0)
Lymphs Abs: 28 10*3/uL — ABNORMAL HIGH (ref 0.7–4.0)
MCHC: 33.1 g/dL (ref 30.0–36.0)
MCV: 96 fl (ref 78.0–100.0)
Monocytes Absolute: 0.4 10*3/uL (ref 0.1–1.0)
Monocytes Relative: 1.3 % — ABNORMAL LOW (ref 3.0–12.0)
Neutro Abs: 2.6 10*3/uL (ref 1.4–7.7)
Neutrophils Relative %: 8.3 % — ABNORMAL LOW (ref 43.0–77.0)
Platelets: 132 10*3/uL — ABNORMAL LOW (ref 150.0–400.0)
RBC: 3.97 Mil/uL — ABNORMAL LOW (ref 4.22–5.81)
RDW: 14 % (ref 11.5–15.5)
WBC: 31 10*3/uL (ref 4.0–10.5)

## 2022-04-30 LAB — TSH: TSH: 1.33 u[IU]/mL (ref 0.35–5.50)

## 2022-04-30 NOTE — Telephone Encounter (Signed)
Critical white count of 31.0 Jerry Gomez from the lab reported. Provider informed.

## 2022-04-30 NOTE — Assessment & Plan Note (Signed)
Use a Rollator walker 

## 2022-04-30 NOTE — Assessment & Plan Note (Signed)
No relapse 

## 2022-04-30 NOTE — Assessment & Plan Note (Signed)
Monitor CBC/WBC 

## 2022-04-30 NOTE — Progress Notes (Signed)
Subjective:  Patient ID: Jerry Gomez, male    DOB: 1931-05-31  Age: 88 y.o. MRN: [id number]  CC: Follow-up   HPI AYO SMOAK presents for weak legs, B severe hip OA, but w/o pain! Seeing Dr Ninfa Linden - in PT at [facility]  - ??R THR  Outpatient Medications Prior to Visit  Medication Sig Dispense Refill   Artificial Tear Ointment (ARTIFICIAL TEARS) ointment Place into both eyes nightly.     aspirin 81 MG EC tablet Take 81 mg by mouth daily. Swallow whole.     atorvastatin (LIPITOR) 20 MG tablet Take 1 tablet (20 mg total) by mouth daily. 90 tablet 1   Cholecalciferol (VITAMIN D) 2000 units tablet Take 2,000 Units by mouth daily.     Ensure (ENSURE) Take 237 mLs by mouth.     Multiple Vitamins-Minerals (MULTIVITAMIN ADULT) CHEW Chew by mouth.     furosemide (LASIX) 20 MG tablet Take 1 tablet (20 mg total) by mouth daily. 30 tablet 0   No facility-administered medications prior to visit.    ROS: Review of Systems  Constitutional:  Negative for appetite change, fatigue and unexpected weight change.  HENT:  Negative for congestion, nosebleeds, sneezing, sore throat and trouble swallowing.   Eyes:  Negative for itching and visual disturbance.  Respiratory:  Negative for cough.   Cardiovascular:  Negative for chest pain, palpitations and leg swelling.  Gastrointestinal:  Negative for abdominal distention, blood in stool, diarrhea and nausea.  Genitourinary:  Negative for frequency and hematuria.  Musculoskeletal:  Positive for gait problem. Negative for back pain, joint swelling and neck pain.  Skin:  Negative for rash.  Neurological:  Negative for dizziness, tremors, speech difficulty and weakness.  Psychiatric/Behavioral:  Negative for agitation, dysphoric mood, sleep disturbance and suicidal ideas. The patient is not nervous/anxious.    Objective:  BP (!) 100/50 (BP Location: Left Arm, Patient Position: Sitting, Cuff Size: Normal)   Pulse 73   Temp 97.8 F (36.6 C) (Oral)    Ht '5\' 6"'$  (1.676 m)   Wt 128 lb (58.1 kg)   SpO2 97%   BMI 20.66 kg/m   BP Readings from Last 3 Encounters:  04/30/22 (!) 100/50  02/21/22 (!) 98/50  01/24/22 (!) 113/50    Wt Readings from Last 3 Encounters:  04/30/22 128 lb (58.1 kg)  03/18/22 129 lb 3.2 oz (58.6 kg)  01/24/22 129 lb 11.2 oz (58.8 kg)    Physical Exam Constitutional:      General: He is not in acute distress.    Appearance: He is well-developed. He is obese.     Comments: NAD  Eyes:     Conjunctiva/sclera: Conjunctivae normal.     Pupils: Pupils are equal, round, and reactive to light.  Neck:     Thyroid: No thyromegaly.     Vascular: No JVD.  Cardiovascular:     Rate and Rhythm: Normal rate and regular rhythm.     Heart sounds: Normal heart sounds. No murmur heard.   No friction rub. No gallop.  Pulmonary:     Effort: Pulmonary effort is normal. No respiratory distress.     Breath sounds: Normal breath sounds. No wheezing or rales.  Chest:     Chest wall: No tenderness.  Abdominal:     General: Bowel sounds are normal. There is no distension.     Palpations: Abdomen is soft. There is no mass.     Tenderness: There is no abdominal tenderness. There is no guarding  or rebound.  Musculoskeletal:        General: No tenderness. Normal range of motion.     Cervical back: Normal range of motion.  Lymphadenopathy:     Cervical: No cervical adenopathy.  Skin:    General: Skin is warm and dry.     Findings: No rash.  Neurological:     Mental Status: He is alert and oriented to person, place, and time.     Cranial Nerves: No cranial nerve deficit.     Motor: No abnormal muscle tone.     Coordination: Coordination normal.     Gait: Gait normal.     Deep Tendon Reflexes: Reflexes are normal and symmetric.  Psychiatric:        Behavior: Behavior normal.        Thought Content: Thought content normal.        Judgment: Judgment normal.  In a w/c A/o/c     Lab Results  Component Value Date   WBC  36.0 (H) 01/24/2022   HGB 12.9 (L) 01/24/2022   HCT 39.5 01/24/2022   PLT 171 01/24/2022   GLUCOSE 102 (H) 01/24/2022   CHOL 119 08/14/2021   TRIG 57.0 08/14/2021   HDL 53.30 08/14/2021   LDLCALC 55 08/14/2021   ALT 16 01/24/2022   AST 17 01/24/2022   NA 137 01/24/2022   K 4.0 01/24/2022   CL 98 01/24/2022   CREATININE 0.74 01/24/2022   BUN 27 (H) 01/24/2022   CO2 32 01/24/2022   TSH 1.62 08/14/2021   PSA 8.51 (H) 08/14/2021    No results found.  Assessment & Plan:   Problem List Items Addressed This Visit     CLL (chronic lymphocytic leukemia) ([place])    Monitor CBC/WBC       Relevant Orders   CBC with Differential/Platelet   Comprehensive metabolic panel   TSH   Lumbar radiculopathy    Use a Rollator walker      Leg weakness, bilateral    Use a Rollator walker      Relevant Orders   CBC with Differential/Platelet   Comprehensive metabolic panel   TSH   Falls    No relapse       Relevant Orders   TSH   Somnolence, daytime    Better. We can consider Ritalin to help with somnolence.      Relevant Orders   TSH      No orders of the defined types were placed in this encounter.     Follow-up: Return in about 3 months (around 07/31/2022) for a follow-up visit.  Walker Kehr, MD

## 2022-04-30 NOTE — Assessment & Plan Note (Signed)
Better. We can consider Ritalin to help with somnolence.

## 2022-05-01 DIAGNOSIS — D044 Carcinoma in situ of skin of scalp and neck: Secondary | ICD-10-CM | POA: Diagnosis not present

## 2022-05-01 NOTE — Telephone Encounter (Signed)
CLL - noted Thx

## 2022-05-07 ENCOUNTER — Ambulatory Visit: Payer: Medicare Other

## 2022-05-07 DIAGNOSIS — M5442 Lumbago with sciatica, left side: Secondary | ICD-10-CM

## 2022-05-07 DIAGNOSIS — M6281 Muscle weakness (generalized): Secondary | ICD-10-CM

## 2022-05-07 DIAGNOSIS — R2689 Other abnormalities of gait and mobility: Secondary | ICD-10-CM | POA: Diagnosis not present

## 2022-05-07 DIAGNOSIS — R293 Abnormal posture: Secondary | ICD-10-CM

## 2022-05-07 NOTE — Therapy (Signed)
OUTPATIENT PHYSICAL THERAPY TREATMENT NOTE   Patient Name: RONDALL RADIGAN MRN: [id number] DOB:Oct 30, 1931, 88 y.o., male Today's Date: 05/07/2022  PCP: Cassandria Anger, MD REFERRING PROVIDER: Mcarthur Rossetti, MD  END OF SESSION:   PT End of Session - 05/07/22 1537     Visit Number 7    Date for PT Re-Evaluation 05/29/22    Authorization Type Medicare    Progress Note Due on Visit 10    PT Start Time 0388    PT Stop Time 1615    PT Time Calculation (min) 45 min    Activity Tolerance Patient tolerated treatment well    Behavior During Therapy WFL for tasks assessed/performed              Past Medical History:  Diagnosis Date   Cancer ([place])    CLL   Cataract    COLONIC POLYPS 03/02/2009   EYE SURGERY, HX OF 09/05/2007   HEMORRHOIDS, HX OF    HYPERLIPIDEMIA 09/05/2007   HYPERTENSION 07/25/2007   OSTEOARTHRITIS 09/05/2007   SLEEP APNEA, OBSTRUCTIVE 09/05/2007   Past Surgical History:  Procedure Laterality Date   EYE SURGERY  10/22/13   left eye, cataract   Patient Active Problem List   Diagnosis Date Noted   Somnolence, daytime 02/24/2022   Falls 01/31/2022   Orthostatic hypotension 01/18/2022   Difficulty walking with leg weakness  01/10/2022   Avascular necrosis of bones of both hips with severe OA of bilateral hips  01/10/2022   Bilateral lower extremity edema 01/10/2022   Leg weakness, bilateral 08/22/2021   Spinal stenosis 03/27/2021   Lumbar radiculopathy 03/12/2021   Acute left-sided low back pain with left-sided sciatica 02/21/2021   Insomnia 10/28/2018   Bradycardia 10/09/2016   Sebaceous cyst 10/09/2016   Band keratopathy of right eye 07/02/2016   Corneal dellen of right eye 07/02/2016   Carotid bruit 04/08/2016   Acute sinusitis 11/01/2015   Elevated PSA 04/10/2015   Well adult exam 10/10/2014   Bruising 10/10/2014   PCO (posterior capsular opacification) 10/27/2013   Nuclear cataract 07/29/2012   Pseudophakia 07/29/2012   CLL  (chronic lymphocytic leukemia) ([place]) 06/09/2012   Cystoid macular edema 06/03/2012   Epiretinal membrane 06/03/2012   Edema 02/07/2012   Pain of right lower leg 02/07/2012   Dyslipidemia 09/05/2007   SLEEP APNEA, OBSTRUCTIVE 09/05/2007   OSTEOARTHRITIS 09/05/2007   Essential hypertension 07/25/2007    REFERRING DIAG:  M16.12 (ICD-10-CM) - Unilateral primary osteoarthritis, left hip  M16.11 (ICD-10-CM) - Unilateral primary osteoarthritis, right hip    THERAPY DIAG:  Muscle weakness (generalized)  Other abnormalities of gait and mobility  Abnormal posture  Acute left-sided low back pain with left-sided sciatica  PERTINENT HISTORY: See above  PRECAUTIONS: fall  SUBJECTIVE: Patient states he is doing fine.  Not getting sore from therapy sessions.   Wife is present throughout session.    PAIN:  Are you having pain? No   OBJECTIVE: (objective measures completed at initial evaluation unless otherwise dated)   OBJECTIVE: from evaluation on 4/256/23  DIAGNOSTIC FINDINGS: from MD note: He has complete loss of joint space on both sides and evidence of advanced osteoarthritis and likely component of osteonecrosis of both hips.   COGNITION:  Overall cognitive status: Within functional limits for tasks assessed     SENSATION: WFL  MUSCLE LENGTH: Bil hamstring length limited in sitting with reduced knee extension.   POSTURE:  Lt LE is flexed, forward head and flexed trunk.   PALPATION: NA  LE ROM:  Not formally tested.  Pt with reduced hamstring length and IR/ER in sitting- limited by 25-50%  LE MMT:  MMT Right 04/11/2022 Left 04/11/2022  Hip flexion 4/5 4/5  Hip extension    Hip abduction 4-/5 4-/5  Hip adduction 4/5 4/5  Hip internal rotation    Hip external rotation    Knee flexion 4+/5 4+/5  Knee extension 4/5 4+/5  Ankle dorsiflexion 4+/5 4+/5  Ankle plantarflexion    Ankle inversion    Ankle eversion     (Blank rows = not tested)   FUNCTIONAL TESTS:   5 times sit to stand: 31 seconds with use of arms Timed up and go (TUG): 1 min, 12 seconds with rolling walker   04/29/22:  5 times sit to stand: 30 seconds with arms and verbal cues for full erect posture.  Timed up and go (TUG): 1 min, 13 seconds with rolling walker  GAIT: Distance walked: 25 Assistive device utilized: Environmental consultant - 2 wheeled Level of assistance: CGA Comments: Reduced step length and toe touch weight bearing on the Lt LE, mod UE support on rolling walker  TODAY'S TREATMENT: Treatment on date: 05/07/22 NuStep x 7 min level 2- PT present to discuss progress Gait training with rolling walker: heavy emphasis, verbal and visual cues for left heel strike and knee extension along with step through with right foot ; worked on continuous stepping Heel slides x 20 each LE Supine hip abduction 2 x 10 with assist Hooklying butterfly stretch x 5 hold 10 sec Bridging 2 x 10 Seated hip ER using strap 5 x 10 sec each LE Gait training with rolling walker exiting building , descending ramp and to car with emphasis on left heel strike, knee extension on stance phase and step through with right foot. Treatment on date: 04/29/22 NuStep x 7 min level 2- PT present to discuss progress Seated LAQ x 20 with 2.5 lb- verbal cues for quad contraction and full knee extension Seated march x 20 with 2.5 lb Seated clam x 20 with green loop Sit to stand 2x5 with max UE support and verbal cues for glute activation Hamstring curls in sitting: yellow loop x20 each Seated hamstring stretches: 3x20 seconds  Seated ball squeeze: 5" hold x20  Treatment on date: 04/23/22 NuStep x 5 min level 2 Seated LAQ x 20 with 2.5 lb Seated march x 20 with 2.5 lb Seated clam x 20 with green loop Instructed in Sit to supine: needed mod assist due to ankle weights on.  Patient positioned on wedge with balance pad to secure and pillow dbld behind his head Hooklying clam green loop x 20 Supine SLR x 10 each with assist to  initiate then patient able to do independently Supine hamstring stretch with strap x 5 each side holding 10 sec each Supine heel slides with strap x 10 each Supine hip adductor stretch x 5 each side with strap holding 10 sec each Hooklying butterfly stretch x 10 hold 10 sec each Gait training with rolling walker 30 feet emphasizing heel strike on left and step through on right.  Patient also needed vc's for stepping too far into walker.  Instructed in transfer into car from walker.      PATIENT EDUCATION:  Education details: Access Code: [email or web address] Person educated: Patient and Spouse Education method: Explanation, Demonstration, and Handouts Education comprehension: verbalized understanding and returned demonstration   HOME EXERCISE PROGRAM: Access Code: [email or web address] URL: [email or web address] Date: 04/03/2022 Prepared by: Claiborne Billings  Exercises -  Seated Long Arc Quad  - 3 x daily - 7 x weekly - 2 sets - 10 reps - 5 hold - Seated March   - 3 x daily - 7 x weekly - 3 sets - 10 reps - Seated Heel Raise  - 3 x daily - 7 x weekly - 2 sets - 10 reps - Sit to Stand with Armchair  - 2 x daily - 7 x weekly - 2 sets - 10 reps  Patient Education - Walking with a Standard Walker   ASSESSMENT:  CLINICAL IMPRESSION: Patient was able to walk 90 feet with rolling walker today.  He needed heavy verbal cues for correct gait pattern. He continues to have severe crepitus in bilateral hips R > L.  Patient will benefit from skilled PT to address these impairments and possibly become candidate for THA's.      OBJECTIVE IMPAIRMENTS Abnormal gait, decreased activity tolerance, decreased balance, decreased endurance, decreased mobility, difficulty walking, decreased ROM, decreased strength, hypomobility, impaired flexibility, and pain.   ACTIVITY LIMITATIONS community activity, driving, and meal prep.   PERSONAL FACTORS Age and 1-2 comorbidities: bil hip OA, weakness, 2 falls  are also affecting  patient's functional outcome.    REHAB POTENTIAL: Good  CLINICAL DECISION MAKING: Stable/uncomplicated  EVALUATION COMPLEXITY: Low   GOALS: Goals reviewed with patient? Yes  SHORT TERM GOALS: Target date: 05/01/22  Be independent in initial HEP Baseline: Goal status: MET (04/29/22)  2.  Perform 5x sit to stand in < or = to 21 seconds with use of arms to reduce falls risk. Baseline: 30 (04/29/22) Goal status: In progress   3.  Demonstrate Lt foot flat in stance phase of gait > or = to 50% of the time with rolling walker  Baseline:  Goal status: In progress   4.  Perform TUG in < or = to 55 seconds to reduce falls risk Baseline:  1 min, 13 seconds (04/29/22) Goal status: In progress    LONG TERM GOALS: Target date: 05/29/22  Be independent in advanced HEP Baseline:  Goal status: INITIAL  2.  Perform 5x sit to stand in < or = to 18 seconds with use of hands to reduce falls risk Baseline: 31 seconds  Goal status: INITIAL  3.  Perform TUG in < or = to 45 seconds to reduce falls risk Baseline:  1 min, 15 seconds  Goal status: INITIAL  4.  Improve LE strength to demonstrate symmetry with gait on level surface with use of rolling walker with min to mod UE support on rolling walker  Baseline:  Goal status: INITIAL  5.  Improve strength and endurance to stand for home tasks x 10-20 minutes without significant fatigue Baseline:  Goal status: INITIAL      PLAN: PT FREQUENCY: 2x/week  PT DURATION: 8 weeks  PLANNED INTERVENTIONS: Therapeutic exercises, Therapeutic activity, Neuromuscular re-education, Balance training, Gait training, Patient/Family education, Joint mobilization, Stair training, Aquatic Therapy, Dry Needling, Electrical stimulation, Wheelchair mobility training, Cryotherapy, Moist heat, Taping, and Manual therapy  PLAN FOR NEXT SESSION: continue to work on mobility and strength, work on Doctor, hospital and endurance, gait and sit to [place] B. Anahit Klumb,  PT 05/07/22 9:56 PM   [facility] [address] [place], [place] [address] Phone # [phone] Fax [phone]

## 2022-05-09 ENCOUNTER — Ambulatory Visit: Payer: Medicare Other | Attending: Orthopaedic Surgery

## 2022-05-09 DIAGNOSIS — R2689 Other abnormalities of gait and mobility: Secondary | ICD-10-CM | POA: Insufficient documentation

## 2022-05-09 DIAGNOSIS — M6281 Muscle weakness (generalized): Secondary | ICD-10-CM | POA: Diagnosis not present

## 2022-05-09 DIAGNOSIS — M5442 Lumbago with sciatica, left side: Secondary | ICD-10-CM | POA: Insufficient documentation

## 2022-05-09 DIAGNOSIS — R293 Abnormal posture: Secondary | ICD-10-CM | POA: Insufficient documentation

## 2022-05-09 DIAGNOSIS — M25552 Pain in left hip: Secondary | ICD-10-CM | POA: Insufficient documentation

## 2022-05-09 DIAGNOSIS — M25551 Pain in right hip: Secondary | ICD-10-CM | POA: Diagnosis not present

## 2022-05-09 NOTE — Therapy (Signed)
OUTPATIENT PHYSICAL THERAPY TREATMENT NOTE   Patient Name: Jerry Gomez MRN: [id number] DOB:Apr 30, 1931, 88 y.o., male Today's Date: 05/09/2022  PCP: Cassandria Anger, MD REFERRING PROVIDER: Mcarthur Rossetti, MD  END OF SESSION:   PT End of Session - 05/09/22 1410     Visit Number 8    Date for PT Re-Evaluation 05/29/22    Authorization Type Medicare    PT Start Time 0947    PT Stop Time 0962    PT Time Calculation (min) 43 min    Activity Tolerance Patient tolerated treatment well    Behavior During Therapy WFL for tasks assessed/performed              Past Medical History:  Diagnosis Date   Cancer ([place])    CLL   Cataract    COLONIC POLYPS 03/02/2009   EYE SURGERY, HX OF 09/05/2007   HEMORRHOIDS, HX OF    HYPERLIPIDEMIA 09/05/2007   HYPERTENSION 07/25/2007   OSTEOARTHRITIS 09/05/2007   SLEEP APNEA, OBSTRUCTIVE 09/05/2007   Past Surgical History:  Procedure Laterality Date   EYE SURGERY  10/22/13   left eye, cataract   Patient Active Problem List   Diagnosis Date Noted   Somnolence, daytime 02/24/2022   Falls 01/31/2022   Orthostatic hypotension 01/18/2022   Difficulty walking with leg weakness  01/10/2022   Avascular necrosis of bones of both hips with severe OA of bilateral hips  01/10/2022   Bilateral lower extremity edema 01/10/2022   Leg weakness, bilateral 08/22/2021   Spinal stenosis 03/27/2021   Lumbar radiculopathy 03/12/2021   Acute left-sided low back pain with left-sided sciatica 02/21/2021   Insomnia 10/28/2018   Bradycardia 10/09/2016   Sebaceous cyst 10/09/2016   Band keratopathy of right eye 07/02/2016   Corneal dellen of right eye 07/02/2016   Carotid bruit 04/08/2016   Acute sinusitis 11/01/2015   Elevated PSA 04/10/2015   Well adult exam 10/10/2014   Bruising 10/10/2014   PCO (posterior capsular opacification) 10/27/2013   Nuclear cataract 07/29/2012   Pseudophakia 07/29/2012   CLL (chronic lymphocytic leukemia) ([place])  06/09/2012   Cystoid macular edema 06/03/2012   Epiretinal membrane 06/03/2012   Edema 02/07/2012   Pain of right lower leg 02/07/2012   Dyslipidemia 09/05/2007   SLEEP APNEA, OBSTRUCTIVE 09/05/2007   OSTEOARTHRITIS 09/05/2007   Essential hypertension 07/25/2007    REFERRING DIAG:  M16.12 (ICD-10-CM) - Unilateral primary osteoarthritis, left hip  M16.11 (ICD-10-CM) - Unilateral primary osteoarthritis, right hip    THERAPY DIAG:  Muscle weakness (generalized)  Other abnormalities of gait and mobility  Abnormal posture  Pain in left hip  Pain in right hip  PERTINENT HISTORY: See above  PRECAUTIONS: fall  SUBJECTIVE: Patient states he was very sore since last session.  He denies any limitations in his normal functional level, however.    Wife is present throughout session.    PAIN:  Are you having pain? No   OBJECTIVE: (objective measures completed at initial evaluation unless otherwise dated)   OBJECTIVE: from evaluation on 4/256/23  DIAGNOSTIC FINDINGS: from MD note: He has complete loss of joint space on both sides and evidence of advanced osteoarthritis and likely component of osteonecrosis of both hips.   COGNITION:  Overall cognitive status: Within functional limits for tasks assessed     SENSATION: WFL  MUSCLE LENGTH: Bil hamstring length limited in sitting with reduced knee extension.   POSTURE:  Lt LE is flexed, forward head and flexed trunk.   PALPATION: NA  LE ROM:  Not formally tested.  Pt with reduced hamstring length and IR/ER in sitting- limited by 25-50%  LE MMT:  MMT Right 04/11/2022 Left 04/11/2022  Hip flexion 4/5 4/5  Hip extension    Hip abduction 4-/5 4-/5  Hip adduction 4/5 4/5  Hip internal rotation    Hip external rotation    Knee flexion 4+/5 4+/5  Knee extension 4/5 4+/5  Ankle dorsiflexion 4+/5 4+/5  Ankle plantarflexion    Ankle inversion    Ankle eversion     (Blank rows = not tested)   FUNCTIONAL TESTS:  5  times sit to stand: 31 seconds with use of arms Timed up and go (TUG): 1 min, 12 seconds with rolling walker   04/29/22:  5 times sit to stand: 30 seconds with arms and verbal cues for full erect posture.  Timed up and go (TUG): 1 min, 13 seconds with rolling walker  GAIT: Distance walked: 50 Assistive device utilized: Environmental consultant - 2 wheeled Level of assistance: CGA Comments: VC's for inceased step length and heel strike on the Lt LE, mod UE support on rolling walker  TODAY'S TREATMENT: Treatment on date: 05/09/22 NuStep x 10 min level 1- PT present to discuss progress Gait training with rolling walker: heavy emphasis, verbal and visual cues for left heel strike and knee extension along with step through with right foot ; worked on continuous stepping x 10 feet then after session approx 50 to 60 feet exiting clinic to car with RW. Seated LAQ x 20 each with 2.5 lb Seated march x 20 with 2.5 lb Toe and heel raises x 20 each Seated hip ER strengthening with 2.5 lb (bottom of foot to opposite ankle) Seated hip ER using strap 5 x 10 sec each LE Heel slides x 10 each LE using strap Manual supine hip abduction x 10 with assist  Treatment on date: 05/07/22 NuStep x 7 min level 2- PT present to discuss progress Gait training with rolling walker: heavy emphasis, verbal and visual cues for left heel strike and knee extension along with step through with right foot ; worked on continuous stepping Heel slides x 20 each LE Supine hip abduction 2 x 10 with assist Hooklying butterfly stretch x 5 hold 10 sec Bridging 2 x 10 Seated hip ER using strap 5 x 10 sec each LE Gait training with rolling walker exiting building , descending ramp and to car with emphasis on left heel strike, knee extension on stance phase and step through with right foot. Treatment on date: 04/29/22 NuStep x 7 min level 2- PT present to discuss progress Seated LAQ x 20 with 2.5 lb- verbal cues for quad contraction and full knee  extension Seated march x 20 with 2.5 lb Seated clam x 20 with green loop Sit to stand 2x5 with max UE support and verbal cues for glute activation Hamstring curls in sitting: yellow loop x20 each Seated hamstring stretches: 3x20 seconds  Seated ball squeeze: 5" hold x20  Treatment on date: 04/23/22 NuStep x 5 min level 2 Seated LAQ x 20 with 2.5 lb Seated march x 20 with 2.5 lb Seated clam x 20 with green loop Instructed in Sit to supine: needed mod assist due to ankle weights on.  Patient positioned on wedge with balance pad to secure and pillow dbld behind his head Hooklying clam green loop x 20 Supine SLR x 10 each with assist to initiate then patient able to do independently Supine hamstring stretch with  strap x 5 each side holding 10 sec each Supine heel slides with strap x 10 each Supine hip adductor stretch x 5 each side with strap holding 10 sec each Hooklying butterfly stretch x 10 hold 10 sec each Gait training with rolling walker 30 feet emphasizing heel strike on left and step through on right.  Patient also needed vc's for stepping too far into walker.  Instructed in transfer into car from walker.      PATIENT EDUCATION:  Education details: Access Code: [email or web address] Person educated: Patient and Spouse Education method: Explanation, Demonstration, and Handouts Education comprehension: verbalized understanding and returned demonstration   HOME EXERCISE PROGRAM: Access Code: [email or web address] URL: [email or web address] Date: 04/03/2022 Prepared by: Claiborne Billings  Exercises - Seated Long Arc Quad  - 3 x daily - 7 x weekly - 2 sets - 10 reps - 5 hold - Seated March   - 3 x daily - 7 x weekly - 3 sets - 10 reps - Seated Heel Raise  - 3 x daily - 7 x weekly - 2 sets - 10 reps - Sit to Stand with Armchair  - 2 x daily - 7 x weekly - 2 sets - 10 reps  Patient Education - Walking with a Standard Walker   ASSESSMENT:  CLINICAL IMPRESSION: Daishaun is progressing appropriately  and was able to complete all tasks today with minimal pain.  He continues to need heavy verbal cues for correct gait pattern. He continues to have severe crepitus in bilateral hips R > L.  He is actually quite functional despite his severe end stage OA. Patient will benefit from skilled PT to address these impairments and possibly become candidate for THA's.      OBJECTIVE IMPAIRMENTS Abnormal gait, decreased activity tolerance, decreased balance, decreased endurance, decreased mobility, difficulty walking, decreased ROM, decreased strength, hypomobility, impaired flexibility, and pain.   ACTIVITY LIMITATIONS community activity, driving, and meal prep.   PERSONAL FACTORS Age and 1-2 comorbidities: bil hip OA, weakness, 2 falls  are also affecting patient's functional outcome.    REHAB POTENTIAL: Good  CLINICAL DECISION MAKING: Stable/uncomplicated  EVALUATION COMPLEXITY: Low   GOALS: Goals reviewed with patient? Yes  SHORT TERM GOALS: Target date: 05/01/22  Be independent in initial HEP Baseline: Goal status: MET (04/29/22)  2.  Perform 5x sit to stand in < or = to 21 seconds with use of arms to reduce falls risk. Baseline: 30 (04/29/22) Goal status: In progress   3.  Demonstrate Lt foot flat in stance phase of gait > or = to 50% of the time with rolling walker  Baseline:  Goal status: In progress   4.  Perform TUG in < or = to 55 seconds to reduce falls risk Baseline:  1 min, 13 seconds (04/29/22) Goal status: In progress    LONG TERM GOALS: Target date: 05/29/22  Be independent in advanced HEP Baseline:  Goal status: INITIAL  2.  Perform 5x sit to stand in < or = to 18 seconds with use of hands to reduce falls risk Baseline: 31 seconds  Goal status: INITIAL  3.  Perform TUG in < or = to 45 seconds to reduce falls risk Baseline:  1 min, 15 seconds  Goal status: INITIAL  4.  Improve LE strength to demonstrate symmetry with gait on level surface with use of rolling  walker with min to mod UE support on rolling walker  Baseline:  Goal status: INITIAL  5.  Improve strength and endurance to stand for  home tasks x 10-20 minutes without significant fatigue Baseline:  Goal status: INITIAL      PLAN: PT FREQUENCY: 2x/week  PT DURATION: 8 weeks  PLANNED INTERVENTIONS: Therapeutic exercises, Therapeutic activity, Neuromuscular re-education, Balance training, Gait training, Patient/Family education, Joint mobilization, Stair training, Aquatic Therapy, Dry Needling, Electrical stimulation, Wheelchair mobility training, Cryotherapy, Moist heat, Taping, and Manual therapy  PLAN FOR NEXT SESSION: continue to work on mobility and strength, work on Doctor, hospital and endurance, gait and sit to [place] B. Shardea Cwynar, PT 05/09/22 4:36 PM   [facility] [address] [place], [place] [address] Phone # [phone] Fax [phone]

## 2022-05-13 ENCOUNTER — Ambulatory Visit: Payer: Medicare Other | Admitting: Physical Therapy

## 2022-05-13 ENCOUNTER — Encounter: Payer: Self-pay | Admitting: Physical Therapy

## 2022-05-13 DIAGNOSIS — M5442 Lumbago with sciatica, left side: Secondary | ICD-10-CM

## 2022-05-13 DIAGNOSIS — M25551 Pain in right hip: Secondary | ICD-10-CM

## 2022-05-13 DIAGNOSIS — R2689 Other abnormalities of gait and mobility: Secondary | ICD-10-CM | POA: Diagnosis not present

## 2022-05-13 DIAGNOSIS — M25552 Pain in left hip: Secondary | ICD-10-CM

## 2022-05-13 DIAGNOSIS — R293 Abnormal posture: Secondary | ICD-10-CM | POA: Diagnosis not present

## 2022-05-13 DIAGNOSIS — M6281 Muscle weakness (generalized): Secondary | ICD-10-CM | POA: Diagnosis not present

## 2022-05-13 NOTE — Therapy (Signed)
OUTPATIENT PHYSICAL THERAPY TREATMENT NOTE   Patient Name: Jerry Gomez MRN: [id number] DOB:11/21/1931, 88 y.o., male Today's Date: 05/13/2022  PCP: Cassandria Anger, MD REFERRING PROVIDER: Mcarthur Rossetti, MD  END OF SESSION:   PT End of Session - 05/13/22 1358     Visit Number 9    Date for PT Re-Evaluation 05/29/22    Authorization Type Medicare    Progress Note Due on Visit 10    PT Start Time 1359    PT Stop Time 1440    PT Time Calculation (min) 41 min    Activity Tolerance Patient tolerated treatment well    Behavior During Therapy WFL for tasks assessed/performed              Past Medical History:  Diagnosis Date   Cancer ([place])    CLL   Cataract    COLONIC POLYPS 03/02/2009   EYE SURGERY, HX OF 09/05/2007   HEMORRHOIDS, HX OF    HYPERLIPIDEMIA 09/05/2007   HYPERTENSION 07/25/2007   OSTEOARTHRITIS 09/05/2007   SLEEP APNEA, OBSTRUCTIVE 09/05/2007   Past Surgical History:  Procedure Laterality Date   EYE SURGERY  10/22/13   left eye, cataract   Patient Active Problem List   Diagnosis Date Noted   Somnolence, daytime 02/24/2022   Falls 01/31/2022   Orthostatic hypotension 01/18/2022   Difficulty walking with leg weakness  01/10/2022   Avascular necrosis of bones of both hips with severe OA of bilateral hips  01/10/2022   Bilateral lower extremity edema 01/10/2022   Leg weakness, bilateral 08/22/2021   Spinal stenosis 03/27/2021   Lumbar radiculopathy 03/12/2021   Acute left-sided low back pain with left-sided sciatica 02/21/2021   Insomnia 10/28/2018   Bradycardia 10/09/2016   Sebaceous cyst 10/09/2016   Band keratopathy of right eye 07/02/2016   Corneal dellen of right eye 07/02/2016   Carotid bruit 04/08/2016   Acute sinusitis 11/01/2015   Elevated PSA 04/10/2015   Well adult exam 10/10/2014   Bruising 10/10/2014   PCO (posterior capsular opacification) 10/27/2013   Nuclear cataract 07/29/2012   Pseudophakia 07/29/2012   CLL (chronic  lymphocytic leukemia) ([place]) 06/09/2012   Cystoid macular edema 06/03/2012   Epiretinal membrane 06/03/2012   Edema 02/07/2012   Pain of right lower leg 02/07/2012   Dyslipidemia 09/05/2007   SLEEP APNEA, OBSTRUCTIVE 09/05/2007   OSTEOARTHRITIS 09/05/2007   Essential hypertension 07/25/2007    REFERRING DIAG:  M16.12 (ICD-10-CM) - Unilateral primary osteoarthritis, left hip  M16.11 (ICD-10-CM) - Unilateral primary osteoarthritis, right hip    THERAPY DIAG:  Muscle weakness (generalized)  Other abnormalities of gait and mobility  Abnormal posture  Pain in left hip  Pain in right hip  Acute left-sided low back pain with left-sided sciatica  PERTINENT HISTORY: See above  PRECAUTIONS: fall  SUBJECTIVE: No functional change, denies current pain. Very audible clunking/grinding of RT hip > LT.   PAIN:  Are you having pain? No   OBJECTIVE: (objective measures completed at initial evaluation unless otherwise dated)   OBJECTIVE: from evaluation on 4/256/23  DIAGNOSTIC FINDINGS: from MD note: He has complete loss of joint space on both sides and evidence of advanced osteoarthritis and likely component of osteonecrosis of both hips.   COGNITION:  Overall cognitive status: Within functional limits for tasks assessed     SENSATION: WFL  MUSCLE LENGTH: Bil hamstring length limited in sitting with reduced knee extension.   POSTURE:  Lt LE is flexed, forward head and flexed trunk.  PALPATION: NA  LE ROM:  Not formally tested.  Pt with reduced hamstring length and IR/ER in sitting- limited by 25-50%  LE MMT:  MMT Right 04/11/2022 Left 04/11/2022  Hip flexion 4/5 4/5  Hip extension    Hip abduction 4-/5 4-/5  Hip adduction 4/5 4/5  Hip internal rotation    Hip external rotation    Knee flexion 4+/5 4+/5  Knee extension 4/5 4+/5  Ankle dorsiflexion 4+/5 4+/5  Ankle plantarflexion    Ankle inversion    Ankle eversion     (Blank rows = not  tested)   FUNCTIONAL TESTS:  5 times sit to stand: 31 seconds with use of arms Timed up and go (TUG): 1 min, 12 seconds with rolling walker   04/29/22:  5 times sit to stand: 30 seconds with arms and verbal cues for full erect posture.  Timed up and go (TUG): 1 min, 13 seconds with rolling walker  GAIT: Distance walked: 50 Assistive device utilized: Environmental consultant - 2 wheeled Level of assistance: CGA Comments: VC's for inceased step length and heel strike on the Lt LE, mod UE support on rolling walker  TODAY'S TREATMENT:  Treatment date 05/13/22: Nustep L2 10 min with PTA present to discuss current status Seated LAQ x 10x Rt 2x10 LT with 3lb: pt sitting on blue balance pad Seated march x 20 with 3 lb Bil Toe and heel raises x 20 each Seated hip ER strengthening with 3 lb (bottom of foot to opposite ankle) 10x  Seated hip ER using strap 5 x 10 sec each LE Heel slides x 10 each LE using strap Adductor squeeze 10x 2  Sit to stand 5x to walker:  40 feet with RW 2x, 2 min sitting rest break in between then to car at end of session. Small hip abd into red loop hold 3 sec 10x  Treatment on date: 05/09/22 NuStep x 10 min level 1- PT present to discuss progress Gait training with rolling walker: heavy emphasis, verbal and visual cues for left heel strike and knee extension along with step through with right foot ; worked on continuous stepping x 10 feet then after session approx 50 to 60 feet exiting clinic to car with RW. Seated LAQ x 20 each with 2.5 lb Seated march x 20 with 2.5 lb Toe and heel raises x 20 each Seated hip ER strengthening with 2.5 lb (bottom of foot to opposite ankle) Seated hip ER using strap 5 x 10 sec each LE Heel slides x 10 each LE using strap Manual supine hip abduction x 10 with assist  Treatment on date: 05/07/22 NuStep x 7 min level 2- PT present to discuss progress Gait training with rolling walker: heavy emphasis, verbal and visual cues for left heel strike and  knee extension along with step through with right foot ; worked on continuous stepping Heel slides x 20 each LE Supine hip abduction 2 x 10 with assist Hooklying butterfly stretch x 5 hold 10 sec Bridging 2 x 10 Seated hip ER using strap 5 x 10 sec each LE Gait training with rolling walker exiting building , descending ramp and to car with emphasis on left heel strike, knee extension on stance phase and step through with right foot. Treatment on date: 04/29/22 NuStep x 7 min level 2- PT present to discuss progress Seated LAQ x 20 with 2.5 lb- verbal cues for quad contraction and full knee extension Seated march x 20 with 2.5 lb Seated clam x  20 with green loop Sit to stand 2x5 with max UE support and verbal cues for glute activation Hamstring curls in sitting: yellow loop x20 each Seated hamstring stretches: 3x20 seconds  Seated ball squeeze: 5" hold x20  .   PATIENT EDUCATION:  Education details: Access Code: [email or web address] Person educated: Patient and Spouse Education method: Explanation, Demonstration, and Handouts Education comprehension: verbalized understanding and returned demonstration   HOME EXERCISE PROGRAM: Access Code: [email or web address] URL: [email or web address] Date: 04/03/2022 Prepared by: Claiborne Billings  Exercises - Seated Long Arc Quad  - 3 x daily - 7 x weekly - 2 sets - 10 reps - 5 hold - Seated March   - 3 x daily - 7 x weekly - 3 sets - 10 reps - Seated Heel Raise  - 3 x daily - 7 x weekly - 2 sets - 10 reps - Sit to Stand with Armchair  - 2 x daily - 7 x weekly - 2 sets - 10 reps  Patient Education - Walking with a Standard Walker   ASSESSMENT:  CLINICAL IMPRESSION: Blayn is progressing appropriately and was able to complete all tasks today no pain.  He continues to need heavy verbal cues for correct gait pattern. He continues to have severe crepitus in bilateral hips R > L.  He is actually quite functional despite his severe end stage OA. Patient will benefit  from skilled PT to address these impairments and possibly become candidate for THA's.      OBJECTIVE IMPAIRMENTS Abnormal gait, decreased activity tolerance, decreased balance, decreased endurance, decreased mobility, difficulty walking, decreased ROM, decreased strength, hypomobility, impaired flexibility, and pain.   ACTIVITY LIMITATIONS community activity, driving, and meal prep.   PERSONAL FACTORS Age and 1-2 comorbidities: bil hip OA, weakness, 2 falls  are also affecting patient's functional outcome.    REHAB POTENTIAL: Good  CLINICAL DECISION MAKING: Stable/uncomplicated  EVALUATION COMPLEXITY: Low   GOALS: Goals reviewed with patient? Yes  SHORT TERM GOALS: Target date: 05/01/22  Be independent in initial HEP Baseline: Goal status: MET (04/29/22)  2.  Perform 5x sit to stand in < or = to 21 seconds with use of arms to reduce falls risk. Baseline: 30 (04/29/22) Goal status: In progress   3.  Demonstrate Lt foot flat in stance phase of gait > or = to 50% of the time with rolling walker  Baseline:  Goal status: In progress   4.  Perform TUG in < or = to 55 seconds to reduce falls risk Baseline:  1 min, 13 seconds (04/29/22) Goal status: In progress    LONG TERM GOALS: Target date: 05/29/22  Be independent in advanced HEP Baseline:  Goal status: INITIAL  2.  Perform 5x sit to stand in < or = to 18 seconds with use of hands to reduce falls risk Baseline: 31 seconds  Goal status: INITIAL  3.  Perform TUG in < or = to 45 seconds to reduce falls risk Baseline:  1 min, 15 seconds  Goal status: INITIAL  4.  Improve LE strength to demonstrate symmetry with gait on level surface with use of rolling walker with min to mod UE support on rolling walker  Baseline:  Goal status: INITIAL  5.  Improve strength and endurance to stand for home tasks x 10-20 minutes without significant fatigue Baseline:  Goal status: INITIAL      PLAN: PT FREQUENCY: 2x/week  PT  DURATION: 8 weeks  PLANNED INTERVENTIONS: Therapeutic exercises, Therapeutic activity, Neuromuscular re-education, Balance training, Gait training,  Patient/Family education, Joint mobilization, Stair training, Aquatic Therapy, Dry Needling, Electrical stimulation, Wheelchair mobility training, Cryotherapy, Moist heat, Taping, and Manual therapy  PLAN FOR NEXT SESSION: continue to work on mobility and strength, work on symmetry and endurance, gait and sit to stand  Myrene Galas, PTA 05/13/22 2:47 PM   [facility] [address], [address] [place], [place] [address] Phone # [phone] Fax [phone]

## 2022-05-15 ENCOUNTER — Ambulatory Visit: Payer: Medicare Other

## 2022-05-15 DIAGNOSIS — M6281 Muscle weakness (generalized): Secondary | ICD-10-CM

## 2022-05-15 DIAGNOSIS — M5442 Lumbago with sciatica, left side: Secondary | ICD-10-CM | POA: Diagnosis not present

## 2022-05-15 DIAGNOSIS — R2689 Other abnormalities of gait and mobility: Secondary | ICD-10-CM | POA: Diagnosis not present

## 2022-05-15 DIAGNOSIS — R293 Abnormal posture: Secondary | ICD-10-CM | POA: Diagnosis not present

## 2022-05-15 DIAGNOSIS — M25552 Pain in left hip: Secondary | ICD-10-CM | POA: Diagnosis not present

## 2022-05-15 DIAGNOSIS — M25551 Pain in right hip: Secondary | ICD-10-CM

## 2022-05-15 NOTE — Therapy (Signed)
OUTPATIENT PHYSICAL THERAPY TREATMENT NOTE   Patient Name: Jerry Gomez MRN: [id number] DOB:[birth year], 88 y.o., male Today's Date: 05/15/2022  PCP: Cassandria Anger, MD REFERRING PROVIDER: Mcarthur Rossetti, MD  END OF SESSION:   PT End of Session - 05/15/22 1500     Visit Number 10    Date for PT Re-Evaluation 05/29/22    Authorization Type Medicare    PT Start Time 5361    PT Stop Time 4431    PT Time Calculation (min) 45 min    Activity Tolerance Patient tolerated treatment well    Behavior During Therapy WFL for tasks assessed/performed              Past Medical History:  Diagnosis Date   Cancer ([place])    CLL   Cataract    COLONIC POLYPS 03/02/2009   EYE SURGERY, HX OF 09/05/2007   HEMORRHOIDS, HX OF    HYPERLIPIDEMIA 09/05/2007   HYPERTENSION 07/25/2007   OSTEOARTHRITIS 09/05/2007   SLEEP APNEA, OBSTRUCTIVE 09/05/2007   Past Surgical History:  Procedure Laterality Date   EYE SURGERY  10/22/13   left eye, cataract   Patient Active Problem List   Diagnosis Date Noted   Somnolence, daytime 02/24/2022   Falls 01/31/2022   Orthostatic hypotension 01/18/2022   Difficulty walking with leg weakness  01/10/2022   Avascular necrosis of bones of both hips with severe OA of bilateral hips  01/10/2022   Bilateral lower extremity edema 01/10/2022   Leg weakness, bilateral 08/22/2021   Spinal stenosis 03/27/2021   Lumbar radiculopathy 03/12/2021   Acute left-sided low back pain with left-sided sciatica 02/21/2021   Insomnia 10/28/2018   Bradycardia 10/09/2016   Sebaceous cyst 10/09/2016   Band keratopathy of right eye 07/02/2016   Corneal dellen of right eye 07/02/2016   Carotid bruit 04/08/2016   Acute sinusitis 11/01/2015   Elevated PSA 04/10/2015   Well adult exam 10/10/2014   Bruising 10/10/2014   PCO (posterior capsular opacification) 10/27/2013   Nuclear cataract 07/29/2012   Pseudophakia 07/29/2012   CLL (chronic lymphocytic leukemia) ([place])  06/09/2012   Cystoid macular edema 06/03/2012   Epiretinal membrane 06/03/2012   Edema 02/07/2012   Pain of right lower leg 02/07/2012   Dyslipidemia 09/05/2007   SLEEP APNEA, OBSTRUCTIVE 09/05/2007   OSTEOARTHRITIS 09/05/2007   Essential hypertension 07/25/2007    REFERRING DIAG:  M16.12 (ICD-10-CM) - Unilateral primary osteoarthritis, left hip  M16.11 (ICD-10-CM) - Unilateral primary osteoarthritis, right hip    THERAPY DIAG:  Muscle weakness (generalized)  Other abnormalities of gait and mobility  Abnormal posture  Pain in left hip  Pain in right hip  Acute left-sided low back pain with left-sided sciatica  PERTINENT HISTORY: See above  PRECAUTIONS: fall  SUBJECTIVE: Patient reports he continues to experience soreness.  "Not in my hips, just in my muscles"  PAIN:  Are you having pain? No   OBJECTIVE: (objective measures completed at initial evaluation unless otherwise dated)   OBJECTIVE: from evaluation on 4/256/23  DIAGNOSTIC FINDINGS: from MD note: He has complete loss of joint space on both sides and evidence of advanced osteoarthritis and likely component of osteonecrosis of both hips.   COGNITION:  Overall cognitive status: Within functional limits for tasks assessed     SENSATION: WFL  MUSCLE LENGTH: Bil hamstring length limited in sitting with reduced knee extension.   POSTURE:  Lt LE is flexed, forward head and flexed trunk.   PALPATION: NA  LE ROM:  Not formally  tested.  Pt with reduced hamstring length and IR/ER in sitting- limited by 25-50%  LE MMT:  MMT Right 04/11/2022 Left 04/11/2022  Hip flexion 4/5 4/5  Hip extension    Hip abduction 4-/5 4-/5  Hip adduction 4/5 4/5  Hip internal rotation    Hip external rotation    Knee flexion 4+/5 4+/5  Knee extension 4/5 4+/5  Ankle dorsiflexion 4+/5 4+/5  Ankle plantarflexion    Ankle inversion    Ankle eversion     (Blank rows = not tested)   FUNCTIONAL TESTS:  5 times sit to  stand: 31 seconds with use of arms Timed up and go (TUG): 1 min, 12 seconds with rolling walker   04/29/22:  5 times sit to stand: 30 seconds with arms and verbal cues for full erect posture.  Timed up and go (TUG): 1 min, 13 seconds with rolling walker  GAIT: Distance walked: 50 Assistive device utilized: Environmental consultant - 2 wheeled Level of assistance: CGA Comments: VC's for inceased step length and heel strike on the Lt LE, mod UE support on rolling walker  TODAY'S TREATMENT:  Treatment date 05/15/22: Nustep L2 10 min with PTA present to discuss current status Standing at bar with left UE support: standing on left working on keeping left heel down and left knee extended and stepping fwd and back with right foot  2 x 10 Standing at bar facing bar with bilateral UE support: side stepping with right foot again working on keeping left foot flat and left knee extended 2 x 10 Standing at bar: wide stance mini squats x 10 Standing wide stance weight shifting x 20 Gait training with rw: 100 feet x 2 ; emphasis on proper heel strike and knee extension on left along with step through on right Educated wife on verbal cues to give patient during ambulation to promote proper gait pattern.    Treatment date 05/13/22: Nustep L2 10 min with PTA present to discuss current status Seated LAQ x 10x Rt 2x10 LT with 3lb: pt sitting on blue balance pad Seated march x 20 with 3 lb Bil Toe and heel raises x 20 each Seated hip ER strengthening with 3 lb (bottom of foot to opposite ankle) 10x  Seated hip ER using strap 5 x 10 sec each LE Heel slides x 10 each LE using strap Adductor squeeze 10x 2  Sit to stand 5x to walker:  40 feet with RW 2x, 2 min sitting rest break in between then to car at end of session. Small hip abd into red loop hold 3 sec 10x  Treatment on date: 05/09/22 NuStep x 10 min level 1- PT present to discuss progress Gait training with rolling walker: heavy emphasis, verbal and visual cues for left  heel strike and knee extension along with step through with right foot ; worked on continuous stepping x 10 feet then after session approx 50 to 60 feet exiting clinic to car with RW. Seated LAQ x 20 each with 2.5 lb Seated march x 20 with 2.5 lb Toe and heel raises x 20 each Seated hip ER strengthening with 2.5 lb (bottom of foot to opposite ankle) Seated hip ER using strap 5 x 10 sec each LE Heel slides x 10 each LE using strap Manual supine hip abduction x 10 with assist  .   PATIENT EDUCATION:  Education details: Proper gait pattern  Person educated: Patient and Spouse Education method: Explanation, Demonstration Education comprehension: verbalized understanding and returned demonstration  HOME EXERCISE PROGRAM: Access Code: [email or web address] URL: [email or web address] Date: 04/03/2022 Prepared by: Claiborne Billings  Exercises - Seated Long Arc Quad  - 3 x daily - 7 x weekly - 2 sets - 10 reps - 5 hold - Seated March   - 3 x daily - 7 x weekly - 3 sets - 10 reps - Seated Heel Raise  - 3 x daily - 7 x weekly - 2 sets - 10 reps - Sit to Stand with Armchair  - 2 x daily - 7 x weekly - 2 sets - 10 reps  Patient Education - Walking with a Standard Walker   ASSESSMENT:  CLINICAL IMPRESSION: Mcdonald appears to be gaining strength but his mobility is still quite limited due to contractures developing due to severe end stage OA.  He is able to demonstrate proper step through gait with right and heel strike with knee extension on left with heavy verbal cues but becomes distracted and reverts back to his default pattern if not given constant verbal cues.  He denies any hip pain when walking and is able to walk approx 100 feet with minimal fatigue.  He would benefit from continued skilled PT for hip mobility and ROM along with functional strengthening to reduce fall risk and be a possible candidate for hip replacement.     OBJECTIVE IMPAIRMENTS Abnormal gait, decreased activity tolerance,  decreased balance, decreased endurance, decreased mobility, difficulty walking, decreased ROM, decreased strength, hypomobility, impaired flexibility, and pain.   ACTIVITY LIMITATIONS community activity, driving, and meal prep.   PERSONAL FACTORS Age and 1-2 comorbidities: bil hip OA, weakness, 2 falls  are also affecting patient's functional outcome.    REHAB POTENTIAL: Good  CLINICAL DECISION MAKING: Stable/uncomplicated  EVALUATION COMPLEXITY: Low   GOALS: Goals reviewed with patient? Yes  SHORT TERM GOALS: Target date: 05/01/22  Be independent in initial HEP Baseline: Goal status: MET (04/29/22)  2.  Perform 5x sit to stand in < or = to 21 seconds with use of arms to reduce falls risk. Baseline: 30 (04/29/22) Goal status: In progress   3.  Demonstrate Lt foot flat in stance phase of gait > or = to 50% of the time with rolling walker  Baseline:  Goal status: In progress   4.  Perform TUG in < or = to 55 seconds to reduce falls risk Baseline:  1 min, 13 seconds (04/29/22) Goal status: In progress    LONG TERM GOALS: Target date: 05/29/22  Be independent in advanced HEP Baseline:  Goal status: INITIAL  2.  Perform 5x sit to stand in < or = to 18 seconds with use of hands to reduce falls risk Baseline: 31 seconds  Goal status: INITIAL  3.  Perform TUG in < or = to 45 seconds to reduce falls risk Baseline:  1 min, 15 seconds  Goal status: INITIAL  4.  Improve LE strength to demonstrate symmetry with gait on level surface with use of rolling walker with min to mod UE support on rolling walker  Baseline:  Goal status: INITIAL  5.  Improve strength and endurance to stand for home tasks x 10-20 minutes without significant fatigue Baseline:  Goal status: INITIAL      PLAN: PT FREQUENCY: 2x/week  PT DURATION: 8 weeks  PLANNED INTERVENTIONS: Therapeutic exercises, Therapeutic activity, Neuromuscular re-education, Balance training, Gait training, Patient/Family  education, Joint mobilization, Stair training, Aquatic Therapy, Dry Needling, Electrical stimulation, Wheelchair mobility training, Cryotherapy, Moist heat, Taping, and Manual therapy  PLAN FOR NEXT  SESSION: continue to work on mobility and strength, work on Doctor, hospital and endurance, gait and sit to [place] B. Kenneth Cuaresma, PT 05/15/22 5:34 PM    [facility] [address] [place], [place] [address] Phone # [phone] Fax [phone]

## 2022-05-21 ENCOUNTER — Ambulatory Visit: Payer: Medicare Other

## 2022-05-21 DIAGNOSIS — M25552 Pain in left hip: Secondary | ICD-10-CM

## 2022-05-21 DIAGNOSIS — M5442 Lumbago with sciatica, left side: Secondary | ICD-10-CM

## 2022-05-21 DIAGNOSIS — M25551 Pain in right hip: Secondary | ICD-10-CM

## 2022-05-21 DIAGNOSIS — R293 Abnormal posture: Secondary | ICD-10-CM | POA: Diagnosis not present

## 2022-05-21 DIAGNOSIS — M6281 Muscle weakness (generalized): Secondary | ICD-10-CM

## 2022-05-21 DIAGNOSIS — R2689 Other abnormalities of gait and mobility: Secondary | ICD-10-CM | POA: Diagnosis not present

## 2022-05-21 NOTE — Therapy (Signed)
OUTPATIENT PHYSICAL THERAPY TREATMENT NOTE   Patient Name: Jerry Gomez MRN: [id number] DOB:Feb 15, 1931, 88 y.o., male Today's Date: 05/21/2022  PCP: Cassandria Anger, MD REFERRING PROVIDER: Mcarthur Rossetti, MD  END OF SESSION:   PT End of Session - 05/21/22 1517     Visit Number 11    Date for PT Re-Evaluation 07/17/22    Authorization Type Medicare    Progress Note Due on Visit 20    PT Start Time 1517    PT Stop Time 1530    PT Time Calculation (min) 45 min    Activity Tolerance Patient tolerated treatment well    Behavior During Therapy WFL for tasks assessed/performed              Past Medical History:  Diagnosis Date   Cancer ([place])    CLL   Cataract    COLONIC POLYPS 03/02/2009   EYE SURGERY, HX OF 09/05/2007   HEMORRHOIDS, HX OF    HYPERLIPIDEMIA 09/05/2007   HYPERTENSION 07/25/2007   OSTEOARTHRITIS 09/05/2007   SLEEP APNEA, OBSTRUCTIVE 09/05/2007   Past Surgical History:  Procedure Laterality Date   EYE SURGERY  10/22/13   left eye, cataract   Patient Active Problem List   Diagnosis Date Noted   Somnolence, daytime 02/24/2022   Falls 01/31/2022   Orthostatic hypotension 01/18/2022   Difficulty walking with leg weakness  01/10/2022   Avascular necrosis of bones of both hips with severe OA of bilateral hips  01/10/2022   Bilateral lower extremity edema 01/10/2022   Leg weakness, bilateral 08/22/2021   Spinal stenosis 03/27/2021   Lumbar radiculopathy 03/12/2021   Acute left-sided low back pain with left-sided sciatica 02/21/2021   Insomnia 10/28/2018   Bradycardia 10/09/2016   Sebaceous cyst 10/09/2016   Band keratopathy of right eye 07/02/2016   Corneal dellen of right eye 07/02/2016   Carotid bruit 04/08/2016   Acute sinusitis 11/01/2015   Elevated PSA 04/10/2015   Well adult exam 10/10/2014   Bruising 10/10/2014   PCO (posterior capsular opacification) 10/27/2013   Nuclear cataract 07/29/2012   Pseudophakia 07/29/2012   CLL  (chronic lymphocytic leukemia) ([place]) 06/09/2012   Cystoid macular edema 06/03/2012   Epiretinal membrane 06/03/2012   Edema 02/07/2012   Pain of right lower leg 02/07/2012   Dyslipidemia 09/05/2007   SLEEP APNEA, OBSTRUCTIVE 09/05/2007   OSTEOARTHRITIS 09/05/2007   Essential hypertension 07/25/2007    REFERRING DIAG:  M16.12 (ICD-10-CM) - Unilateral primary osteoarthritis, left hip  M16.11 (ICD-10-CM) - Unilateral primary osteoarthritis, right hip    THERAPY DIAG:  Muscle weakness (generalized)  Other abnormalities of gait and mobility  Abnormal posture  Pain in left hip  Pain in right hip  Acute left-sided low back pain with left-sided sciatica  PERTINENT HISTORY: See above  PRECAUTIONS: fall  SUBJECTIVE: Patient reports he continues to experience soreness. He reports that he feels like he is getting too sore and this results in him feeling less stable when he walks.  We spend a lengthy amount of time discussing possible reasons for his weakness and soreness.  We discussed options for therapy and introduced idea of aquatics program.   PAIN:  Are you having pain? No   OBJECTIVE: (objective measures completed at initial evaluation unless otherwise dated)   OBJECTIVE: from evaluation on 4/256/23  DIAGNOSTIC FINDINGS: from MD note: He has complete loss of joint space on both sides and evidence of advanced osteoarthritis and likely component of osteonecrosis of both hips.   COGNITION:  Overall cognitive status: Within functional limits for tasks assessed     SENSATION: WFL  MUSCLE LENGTH: Bil hamstring length limited in sitting with reduced knee extension.   POSTURE:  Lt LE is flexed, forward head and flexed trunk.   PALPATION: NA  LE ROM:  Not formally tested.  Pt with reduced hamstring length and IR/ER in sitting- limited by 25-50%  LE MMT:  MMT Right 04/11/2022 Left 04/11/2022  Hip flexion 4/5 4/5  Hip extension    Hip abduction 4-/5 4-/5  Hip  adduction 4/5 4/5  Hip internal rotation    Hip external rotation    Knee flexion 4+/5 4+/5  Knee extension 4/5 4+/5  Ankle dorsiflexion 4+/5 4+/5  Ankle plantarflexion    Ankle inversion    Ankle eversion     (Blank rows = not tested)   FUNCTIONAL TESTS:  5 times sit to stand: 31 seconds with use of arms Timed up and go (TUG): 1 min, 12 seconds with rolling walker   04/29/22:  5 times sit to stand: 30 seconds with arms and verbal cues for full erect posture.  Timed up and go (TUG): 1 min, 13 seconds with rolling walker  GAIT: Distance walked: 50 Assistive device utilized: Walker - 2 wheeled Level of assistance: CGA Comments: VC's for inceased step length and heel strike on the Lt LE, mod UE support on rolling walker  TODAY'S TREATMENT:  Treatment date 05/21/22: Lengthy discussion about patient having increased soreness and pain from therapy sessions despite trying to modify to avoid this.  Discussed options and decided to try aquatics program.   NuStep x 10 min Level 1 Wheelchair exercises: seated toe raises, heel raises, marching, and LAQ x 20 each Treatment date 05/15/22: Nustep L2 10 min with PTA present to discuss current status Standing at bar with left UE support: standing on left working on keeping left heel down and left knee extended and stepping fwd and back with right foot  2 x 10 Standing at bar facing bar with bilateral UE support: side stepping with right foot again working on keeping left foot flat and left knee extended 2 x 10 Standing at bar: wide stance mini squats x 10 Standing wide stance weight shifting x 20 Gait training with rw: 100 feet x 2 ; emphasis on proper heel strike and knee extension on left along with step through on right Educated wife on verbal cues to give patient during ambulation to promote proper gait pattern.    Treatment date 05/13/22: Nustep L2 10 min with PTA present to discuss current status Seated LAQ x 10x Rt 2x10 LT with 3lb: pt  sitting on blue balance pad Seated march x 20 with 3 lb Bil Toe and heel raises x 20 each Seated hip ER strengthening with 3 lb (bottom of foot to opposite ankle) 10x  Seated hip ER using strap 5 x 10 sec each LE Heel slides x 10 each LE using strap Adductor squeeze 10x 2  Sit to stand 5x to walker:  40 feet with RW 2x, 2 min sitting rest break in between then to car at end of session. Small hip abd into red loop hold 3 sec 10x     PATIENT EDUCATION:  Education details: Proper gait pattern  Person educated: Patient and Spouse Education method: Explanation, Demonstration Education comprehension: verbalized understanding and returned demonstration   HOME EXERCISE PROGRAM: Access Code: [email or web address] URL: [email or web address] Date: 04/03/2022 Prepared by: [place]  -  3 x daily - 7 x weekly - 2 sets - 10 reps - 5 hold - Seated March   - 3 x daily - 7 x weekly - 3 sets - 10 reps - Seated Heel Raise  - 3 x daily - 7 x weekly - 2 sets - 10 reps - Sit to Stand with Armchair  - 2 x daily - 7 x weekly - 2 sets - 10 reps  Patient Education - Walking with a Standard Walker   ASSESSMENT:  CLINICAL IMPRESSION: Oak seems to be experiencing increasing soreness and decreasing function with land exercise.  He has been working diligently on step length while walking and he feels this may be causing more pain.  We discussed aquatics program to [facility] the hip joints and allow for possible more freedom of movement.    He would benefit from continued skilled PT in pool for hip mobility and ROM along with functional strengthening to reduce fall risk and be a possible candidate for hip replacement.     OBJECTIVE IMPAIRMENTS Abnormal gait, decreased activity tolerance, decreased balance, decreased endurance, decreased mobility, difficulty walking, decreased ROM, decreased strength, hypomobility, impaired flexibility, and pain.   ACTIVITY LIMITATIONS  community activity, driving, and meal prep.   PERSONAL FACTORS Age and 1-2 comorbidities: bil hip OA, weakness, 2 falls  are also affecting patient's functional outcome.    REHAB POTENTIAL: Good  CLINICAL DECISION MAKING: Stable/uncomplicated  EVALUATION COMPLEXITY: Low   GOALS: Goals reviewed with patient? Yes  SHORT TERM GOALS: Target date: 05/01/22  Be independent in initial HEP Baseline: Goal status: MET (04/29/22)  2.  Perform 5x sit to stand in < or = to 21 seconds with use of arms to reduce falls risk. Baseline: 30 (04/29/22) Goal status: In progress   3.  Demonstrate Lt foot flat in stance phase of gait > or = to 50% of the time with rolling walker  Baseline:  Goal status: In progress   4.  Perform TUG in < or = to 55 seconds to reduce falls risk Baseline:  1 min, 13 seconds (04/29/22) Goal status: In progress    LONG TERM GOALS: Target date: 05/29/22  Be independent in advanced HEP Baseline:  Goal status: INITIAL  2.  Perform 5x sit to stand in < or = to 18 seconds with use of hands to reduce falls risk Baseline: 31 seconds  Goal status: INITIAL  3.  Perform TUG in < or = to 45 seconds to reduce falls risk Baseline:  1 min, 15 seconds  Goal status: INITIAL  4.  Improve LE strength to demonstrate symmetry with gait on level surface with use of rolling walker with min to mod UE support on rolling walker  Baseline:  Goal status: INITIAL  5.  Improve strength and endurance to stand for home tasks x 10-20 minutes without significant fatigue Baseline:  Goal status: INITIAL      PLAN: PT FREQUENCY: 2x/week  PT DURATION: 8 weeks  PLANNED INTERVENTIONS: Therapeutic exercises, Therapeutic activity, Neuromuscular re-education, Balance training, Gait training, Patient/Family education, Joint mobilization, Stair training, Aquatic Therapy, Dry Needling, Electrical stimulation, Wheelchair mobility training, Cryotherapy, Moist heat, Taping, and Manual  therapy  PLAN FOR NEXT SESSION: continue to work on mobility and strength, work on Doctor, hospital and endurance, gait and sit to [place] B. Syble Picco, PT 05/21/22 3:38 PM   [facility] [address] [place],  [address] Phone # [phone] Fax [phone]

## 2022-05-23 ENCOUNTER — Ambulatory Visit: Payer: Medicare Other

## 2022-05-23 DIAGNOSIS — M5442 Lumbago with sciatica, left side: Secondary | ICD-10-CM | POA: Diagnosis not present

## 2022-05-23 DIAGNOSIS — M25551 Pain in right hip: Secondary | ICD-10-CM | POA: Diagnosis not present

## 2022-05-23 DIAGNOSIS — R293 Abnormal posture: Secondary | ICD-10-CM

## 2022-05-23 DIAGNOSIS — R2689 Other abnormalities of gait and mobility: Secondary | ICD-10-CM | POA: Diagnosis not present

## 2022-05-23 DIAGNOSIS — M25552 Pain in left hip: Secondary | ICD-10-CM | POA: Diagnosis not present

## 2022-05-23 DIAGNOSIS — M6281 Muscle weakness (generalized): Secondary | ICD-10-CM | POA: Diagnosis not present

## 2022-05-23 NOTE — Therapy (Signed)
OUTPATIENT PHYSICAL THERAPY TREATMENT NOTE   Patient Name: Jerry Gomez MRN: [id number] DOB:Mar 01, 1931, 88 y.o., male Today's Date: 05/23/2022  PCP: Cassandria Anger, MD REFERRING PROVIDER: Mcarthur Rossetti, MD  END OF SESSION:   PT End of Session - 05/23/22 1442     Visit Number 12    Date for PT Re-Evaluation 07/17/22    Authorization Type Medicare    Progress Note Due on Visit 20    PT Start Time 1443    PT Stop Time 1525    PT Time Calculation (min) 42 min    Activity Tolerance Patient tolerated treatment well    Behavior During Therapy WFL for tasks assessed/performed              Past Medical History:  Diagnosis Date   Cancer ([place])    CLL   Cataract    COLONIC POLYPS 03/02/2009   EYE SURGERY, HX OF 09/05/2007   HEMORRHOIDS, HX OF    HYPERLIPIDEMIA 09/05/2007   HYPERTENSION 07/25/2007   OSTEOARTHRITIS 09/05/2007   SLEEP APNEA, OBSTRUCTIVE 09/05/2007   Past Surgical History:  Procedure Laterality Date   EYE SURGERY  10/22/13   left eye, cataract   Patient Active Problem List   Diagnosis Date Noted   Somnolence, daytime 02/24/2022   Falls 01/31/2022   Orthostatic hypotension 01/18/2022   Difficulty walking with leg weakness  01/10/2022   Avascular necrosis of bones of both hips with severe OA of bilateral hips  01/10/2022   Bilateral lower extremity edema 01/10/2022   Leg weakness, bilateral 08/22/2021   Spinal stenosis 03/27/2021   Lumbar radiculopathy 03/12/2021   Acute left-sided low back pain with left-sided sciatica 02/21/2021   Insomnia 10/28/2018   Bradycardia 10/09/2016   Sebaceous cyst 10/09/2016   Band keratopathy of right eye 07/02/2016   Corneal dellen of right eye 07/02/2016   Carotid bruit 04/08/2016   Acute sinusitis 11/01/2015   Elevated PSA 04/10/2015   Well adult exam 10/10/2014   Bruising 10/10/2014   PCO (posterior capsular opacification) 10/27/2013   Nuclear cataract 07/29/2012   Pseudophakia 07/29/2012   CLL  (chronic lymphocytic leukemia) ([place]) 06/09/2012   Cystoid macular edema 06/03/2012   Epiretinal membrane 06/03/2012   Edema 02/07/2012   Pain of right lower leg 02/07/2012   Dyslipidemia 09/05/2007   SLEEP APNEA, OBSTRUCTIVE 09/05/2007   OSTEOARTHRITIS 09/05/2007   Essential hypertension 07/25/2007    REFERRING DIAG:  M16.12 (ICD-10-CM) - Unilateral primary osteoarthritis, left hip  M16.11 (ICD-10-CM) - Unilateral primary osteoarthritis, right hip    THERAPY DIAG:  Muscle weakness (generalized)  Other abnormalities of gait and mobility  Abnormal posture  Pain in left hip  Pain in right hip  Acute left-sided low back pain with left-sided sciatica  PERTINENT HISTORY: See above  PRECAUTIONS: fall  SUBJECTIVE: Patient reports he continues to experience soreness. He reports that he feels like he is getting too sore and this results in him feeling less stable when he walks.  We spend a lengthy amount of time discussing possible reasons for his weakness and soreness.  We discussed options for therapy and introduced idea of aquatics program.   PAIN:  Are you having pain? No   OBJECTIVE: (objective measures completed at initial evaluation unless otherwise dated)   OBJECTIVE: from evaluation on 4/256/23  DIAGNOSTIC FINDINGS: from MD note: He has complete loss of joint space on both sides and evidence of advanced osteoarthritis and likely component of osteonecrosis of both hips.   COGNITION:  Overall cognitive status: Within functional limits for tasks assessed     SENSATION: WFL  MUSCLE LENGTH: Bil hamstring length limited in sitting with reduced knee extension.   POSTURE:  Lt LE is flexed, forward head and flexed trunk.   PALPATION: NA  LE ROM:  Not formally tested.  Pt with reduced hamstring length and IR/ER in sitting- limited by 25-50%  LE MMT:  MMT Right 04/11/2022 Left 04/11/2022  Hip flexion 4/5 4/5  Hip extension    Hip abduction 4-/5 4-/5  Hip  adduction 4/5 4/5  Hip internal rotation    Hip external rotation    Knee flexion 4+/5 4+/5  Knee extension 4/5 4+/5  Ankle dorsiflexion 4+/5 4+/5  Ankle plantarflexion    Ankle inversion    Ankle eversion     (Blank rows = not tested)   FUNCTIONAL TESTS:  5 times sit to stand: 31 seconds with use of arms Timed up and go (TUG): 1 min, 12 seconds with rolling walker   04/29/22:  5 times sit to stand: 30 seconds with arms and verbal cues for full erect posture.  Timed up and go (TUG): 1 min, 13 seconds with rolling walker  GAIT: Distance walked: 50 Assistive device utilized: Environmental consultant - 2 wheeled Level of assistance: CGA Comments: VC's for inceased step length and heel strike on the Lt LE, mod UE support on rolling walker  TODAY'S TREATMENT:  Treatment date 05/23/22: NuStep x 10 min Level 1 Seated:  toe raises, heel raises, marching, and LAQ x 20 each, hip abduction x 20, ball squeeze x 20 Seated hip adduction (purple ball) x 20 Seated hip ER x 20 both Seated bicep curls x 20 with 2 lb Seated overhead press x 20 with 2 lb Sit to stand x 5 educated on fwd weight shift and proper hand placement Gait training exiting clinic, negotiating ramp and transfer into car using rw 50 feet Treatment date 05/21/22: Lengthy discussion about patient having increased soreness and pain from therapy sessions despite trying to modify to avoid this.  Discussed options and decided to try aquatics program.   NuStep x 10 min Level 1 Wheelchair exercises: seated toe raises, heel raises, marching, and LAQ x 20 each Treatment date 05/15/22: Nustep L2 10 min with PTA present to discuss current status Standing at bar with left UE support: standing on left working on keeping left heel down and left knee extended and stepping fwd and back with right foot  2 x 10 Standing at bar facing bar with bilateral UE support: side stepping with right foot again working on keeping left foot flat and left knee extended 2 x  10 Standing at bar: wide stance mini squats x 10 Standing wide stance weight shifting x 20 Gait training with rw: 100 feet x 2 ; emphasis on proper heel strike and knee extension on left along with step through on right Educated wife on verbal cues to give patient during ambulation to promote proper gait pattern.        PATIENT EDUCATION:  Education details: Proper gait pattern  Person educated: Patient and Spouse Education method: Explanation, Demonstration Education comprehension: verbalized understanding and returned demonstration   HOME EXERCISE PROGRAM: Access Code: [email or web address] URL: [email or web address] Date: 04/03/2022 Prepared by: Claiborne Billings  Exercises - Seated Long Arc Quad  - 3 x daily - 7 x weekly - 2 sets - 10 reps - 5 hold - Seated March   - 3 x daily - 7 x weekly - 3  sets - 10 reps - Seated Heel Raise  - 3 x daily - 7 x weekly - 2 sets - 10 reps - Sit to Stand with Armchair  - 2 x daily - 7 x weekly - 2 sets - 10 reps  Patient Education - Walking with a Standard Walker   ASSESSMENT:  CLINICAL IMPRESSION: Zayon was able to complete all tasks today with minimal pain but with severe crepitus.  He and wife needed reminders and moderate verbal cues regarding car transfer.    He would benefit from continued skilled PT in pool for hip mobility and ROM along with functional strengthening to reduce fall risk and be a possible candidate for hip replacement.     OBJECTIVE IMPAIRMENTS Abnormal gait, decreased activity tolerance, decreased balance, decreased endurance, decreased mobility, difficulty walking, decreased ROM, decreased strength, hypomobility, impaired flexibility, and pain.   ACTIVITY LIMITATIONS community activity, driving, and meal prep.   PERSONAL FACTORS Age and 1-2 comorbidities: bil hip OA, weakness, 2 falls  are also affecting patient's functional outcome.    REHAB POTENTIAL: Good  CLINICAL DECISION MAKING: Stable/uncomplicated  EVALUATION  COMPLEXITY: Low   GOALS: Goals reviewed with patient? Yes  SHORT TERM GOALS: Target date: 05/01/22  Be independent in initial HEP Baseline: Goal status: MET (04/29/22)  2.  Perform 5x sit to stand in < or = to 21 seconds with use of arms to reduce falls risk. Baseline: 30 (04/29/22) Goal status: In progress   3.  Demonstrate Lt foot flat in stance phase of gait > or = to 50% of the time with rolling walker  Baseline:  Goal status: In progress   4.  Perform TUG in < or = to 55 seconds to reduce falls risk Baseline:  1 min, 13 seconds (04/29/22) Goal status: In progress    LONG TERM GOALS: Target date: 05/29/22  Be independent in advanced HEP Baseline:  Goal status: INITIAL  2.  Perform 5x sit to stand in < or = to 18 seconds with use of hands to reduce falls risk Baseline: 31 seconds  Goal status: in progress  3.  Perform TUG in < or = to 45 seconds to reduce falls risk Baseline:  1 min, 15 seconds  Goal status: INITIAL  4.  Improve LE strength to demonstrate symmetry with gait on level surface with use of rolling walker with min to mod UE support on rolling walker  Baseline:  Goal status: INITIAL  5.  Improve strength and endurance to stand for home tasks x 10-20 minutes without significant fatigue Baseline:  Goal status: INITIAL      PLAN: PT FREQUENCY: 2x/week  PT DURATION: 8 weeks  PLANNED INTERVENTIONS: Therapeutic exercises, Therapeutic activity, Neuromuscular re-education, Balance training, Gait training, Patient/Family education, Joint mobilization, Stair training, Aquatic Therapy, Dry Needling, Electrical stimulation, Wheelchair mobility training, Cryotherapy, Moist heat, Taping, and Manual therapy  PLAN FOR NEXT SESSION: continue to work on mobility and strength, work on symmetry and endurance, gait and sit to stand.  Patient will be transitioning to pool next visit.   [place] B. Ashani Pumphrey, PT 05/23/22 3:31 PM   [facility]  [facility] [address] [place] [address] [place], [place] [address] Phone # [phone] Fax [phone]

## 2022-05-29 ENCOUNTER — Ambulatory Visit: Payer: Medicare Other | Admitting: Orthopaedic Surgery

## 2022-05-29 ENCOUNTER — Ambulatory Visit (HOSPITAL_BASED_OUTPATIENT_CLINIC_OR_DEPARTMENT_OTHER): Payer: Medicare Other | Attending: Orthopaedic Surgery | Admitting: Physical Therapy

## 2022-05-29 ENCOUNTER — Encounter (HOSPITAL_BASED_OUTPATIENT_CLINIC_OR_DEPARTMENT_OTHER): Payer: Self-pay | Admitting: Physical Therapy

## 2022-05-29 DIAGNOSIS — M6281 Muscle weakness (generalized): Secondary | ICD-10-CM | POA: Diagnosis not present

## 2022-05-29 DIAGNOSIS — M25551 Pain in right hip: Secondary | ICD-10-CM | POA: Insufficient documentation

## 2022-05-29 DIAGNOSIS — R293 Abnormal posture: Secondary | ICD-10-CM | POA: Insufficient documentation

## 2022-05-29 DIAGNOSIS — M25552 Pain in left hip: Secondary | ICD-10-CM | POA: Insufficient documentation

## 2022-05-29 DIAGNOSIS — R2689 Other abnormalities of gait and mobility: Secondary | ICD-10-CM | POA: Diagnosis not present

## 2022-05-29 NOTE — Therapy (Signed)
OUTPATIENT PHYSICAL THERAPY TREATMENT NOTE   Patient Name: Jerry Gomez MRN: [id number] DOB:[birth year], 88 y.o., male Today's Date: 05/29/2022  PCP: Cassandria Anger, MD REFERRING PROVIDER: Mcarthur Rossetti, MD  END OF SESSION:   PT End of Session - 05/29/22 1836     Visit Number 13    Date for PT Re-Evaluation 07/17/22    Authorization Type Medicare    Progress Note Due on Visit 20    PT Start Time 9892    PT Stop Time 1630    PT Time Calculation (min) 40 min    Activity Tolerance Patient tolerated treatment well    Behavior During Therapy WFL for tasks assessed/performed               Past Medical History:  Diagnosis Date   Cancer ([place])    CLL   Cataract    COLONIC POLYPS 03/02/2009   EYE SURGERY, HX OF 09/05/2007   HEMORRHOIDS, HX OF    HYPERLIPIDEMIA 09/05/2007   HYPERTENSION 07/25/2007   OSTEOARTHRITIS 09/05/2007   SLEEP APNEA, OBSTRUCTIVE 09/05/2007   Past Surgical History:  Procedure Laterality Date   EYE SURGERY  10/22/13   left eye, cataract   Patient Active Problem List   Diagnosis Date Noted   Somnolence, daytime 02/24/2022   Falls 01/31/2022   Orthostatic hypotension 01/18/2022   Difficulty walking with leg weakness  01/10/2022   Avascular necrosis of bones of both hips with severe OA of bilateral hips  01/10/2022   Bilateral lower extremity edema 01/10/2022   Leg weakness, bilateral 08/22/2021   Spinal stenosis 03/27/2021   Lumbar radiculopathy 03/12/2021   Acute left-sided low back pain with left-sided sciatica 02/21/2021   Insomnia 10/28/2018   Bradycardia 10/09/2016   Sebaceous cyst 10/09/2016   Band keratopathy of right eye 07/02/2016   Corneal dellen of right eye 07/02/2016   Carotid bruit 04/08/2016   Acute sinusitis 11/01/2015   Elevated PSA 04/10/2015   Well adult exam 10/10/2014   Bruising 10/10/2014   PCO (posterior capsular opacification) 10/27/2013   Nuclear cataract 07/29/2012   Pseudophakia 07/29/2012   CLL  (chronic lymphocytic leukemia) ([place]) 06/09/2012   Cystoid macular edema 06/03/2012   Epiretinal membrane 06/03/2012   Edema 02/07/2012   Pain of right lower leg 02/07/2012   Dyslipidemia 09/05/2007   SLEEP APNEA, OBSTRUCTIVE 09/05/2007   OSTEOARTHRITIS 09/05/2007   Essential hypertension 07/25/2007    REFERRING DIAG:  M16.12 (ICD-10-CM) - Unilateral primary osteoarthritis, left hip  M16.11 (ICD-10-CM) - Unilateral primary osteoarthritis, right hip    THERAPY DIAG:  Muscle weakness (generalized)  Other abnormalities of gait and mobility  Abnormal posture  Pain in left hip  Pain in right hip  PERTINENT HISTORY: See above  PRECAUTIONS: fall  SUBJECTIVE: Pt reports no hesitation with getting in pool.  PAIN:  Are you having pain? No   OBJECTIVE: (objective measures completed at initial evaluation unless otherwise dated)   OBJECTIVE: from evaluation on 4/256/23  DIAGNOSTIC FINDINGS: from MD note: He has complete loss of joint space on both sides and evidence of advanced osteoarthritis and likely component of osteonecrosis of both hips.   COGNITION:  Overall cognitive status: Within functional limits for tasks assessed     SENSATION: WFL  MUSCLE LENGTH: Bil hamstring length limited in sitting with reduced knee extension.   POSTURE:  Lt LE is flexed, forward head and flexed trunk.   PALPATION: NA  LE ROM:  Not formally tested.  Pt with reduced hamstring length  and IR/ER in sitting- limited by 25-50%  LE MMT:  MMT Right 04/11/2022 Left 04/11/2022  Hip flexion 4/5 4/5  Hip extension    Hip abduction 4-/5 4-/5  Hip adduction 4/5 4/5  Hip internal rotation    Hip external rotation    Knee flexion 4+/5 4+/5  Knee extension 4/5 4+/5  Ankle dorsiflexion 4+/5 4+/5  Ankle plantarflexion    Ankle inversion    Ankle eversion     (Blank rows = not tested)   FUNCTIONAL TESTS:  5 times sit to stand: 31 seconds with use of arms Timed up and go (TUG): 1 min,  12 seconds with rolling walker   04/29/22:  5 times sit to stand: 30 seconds with arms and verbal cues for full erect posture.  Timed up and go (TUG): 1 min, 13 seconds with rolling walker  GAIT: Distance walked: 50 Assistive device utilized: Walker - 2 wheeled Level of assistance: CGA Comments: VC's for inceased step length and heel strike on the Lt LE, mod UE support on rolling walker  TODAY'S TREATMENT: Treatment date 05/23/22: Pt seen for aquatic therapy today.  Treatment took place in water 3.25-4.8 ft in depth at the [facility] pool. Temp of water was 92.  Pt entered/exited the pool via lift.  STS from submerged lift chair with min assist.  Cues for weight shift Walking forward, backward and side stepping using water walker. Cues for weight shifting, hip/knee flex for advancement of LE Standing holding to wall: df; pf; marching x 5 Seated LAQ 2 x10 R/L; add/abd 2x10 (difficulty tolerating and ROM ~ 25% of normal); SLR (flutter kicking) x10   Pt requires buoyancy for support and to offload joints with strengthening exercises. Viscosity of the water is needed for resistance of strengthening; water current perturbations provides challenge to standing balance unsupported, requiring increased core activation.   Treatment date 05/23/22: NuStep x 10 min Level 1 Seated:  toe raises, heel raises, marching, and LAQ x 20 each, hip abduction x 20, ball squeeze x 20 Seated hip adduction (purple ball) x 20 Seated hip ER x 20 both Seated bicep curls x 20 with 2 lb Seated overhead press x 20 with 2 lb Sit to stand x 5 educated on fwd weight shift and proper hand placement Gait training exiting clinic, negotiating ramp and transfer into car using rw 50 feet Treatment date 05/21/22: Lengthy discussion about patient having increased soreness and pain from therapy sessions despite trying to modify to avoid this.  Discussed options and decided to try aquatics program.   NuStep x 10 min  Level 1 Wheelchair exercises: seated toe raises, heel raises, marching, and LAQ x 20 each Treatment date 05/15/22: Nustep L2 10 min with PTA present to discuss current status Standing at bar with left UE support: standing on left working on keeping left heel down and left knee extended and stepping fwd and back with right foot  2 x 10 Standing at bar facing bar with bilateral UE support: side stepping with right foot again working on keeping left foot flat and left knee extended 2 x 10 Standing at bar: wide stance mini squats x 10 Standing wide stance weight shifting x 20 Gait training with rw: 100 feet x 2 ; emphasis on proper heel strike and knee extension on left along with step through on right Educated wife on verbal cues to give patient during ambulation to promote proper gait pattern.        PATIENT EDUCATION:  Education details: Proper gait pattern  Person educated: Patient and Spouse Education method: Explanation, Demonstration Education comprehension: verbalized understanding and returned demonstration   HOME EXERCISE PROGRAM: Access Code: [email or web address] URL: [email or web address] Date: 04/03/2022 Prepared by: Claiborne Billings  Exercises - Seated Long Arc Quad  - 3 x daily - 7 x weekly - 2 sets - 10 reps - 5 hold - Seated March   - 3 x daily - 7 x weekly - 3 sets - 10 reps - Seated Heel Raise  - 3 x daily - 7 x weekly - 2 sets - 10 reps - Sit to Stand with Armchair  - 2 x daily - 7 x weekly - 2 sets - 10 reps  Patient Education - Walking with a Standard Walker   ASSESSMENT:  CLINICAL IMPRESSION: Pt introduced to setting.  EDU on properties of water and benefits of aquatic therapy. He initiates walking with a water walker demonstrating some apprehensiveness which decreases completely as session continues.  He has difficulty with advancing RLE in all walking directions. VC does not improve. He does spend ~ 15-20 mins standing walking and exercising prior to rest period.  Cadence if very slowed. Cues given throughout for weight shifting to improve standing balance.  Seated rest period given prior to seated exercise.  Pt and wife pleasantly surprised at his improved ability to complete movement submerged.  He is an excellent candidate for aquatic therapy using properties of water to facilitate and hasten progression towards goals.    OBJECTIVE IMPAIRMENTS Abnormal gait, decreased activity tolerance, decreased balance, decreased endurance, decreased mobility, difficulty walking, decreased ROM, decreased strength, hypomobility, impaired flexibility, and pain.   ACTIVITY LIMITATIONS community activity, driving, and meal prep.   PERSONAL FACTORS Age and 1-2 comorbidities: bil hip OA, weakness, 2 falls  are also affecting patient's functional outcome.    REHAB POTENTIAL: Good  CLINICAL DECISION MAKING: Stable/uncomplicated  EVALUATION COMPLEXITY: Low   GOALS: Goals reviewed with patient? Yes  SHORT TERM GOALS: Target date: 05/01/22  Be independent in initial HEP Baseline: Goal status: MET (04/29/22)  2.  Perform 5x sit to stand in < or = to 21 seconds with use of arms to reduce falls risk. Baseline: 30 (04/29/22) Goal status: In progress   3.  Demonstrate Lt foot flat in stance phase of gait > or = to 50% of the time with rolling walker  Baseline:  Goal status: In progress   4.  Perform TUG in < or = to 55 seconds to reduce falls risk Baseline:  1 min, 13 seconds (04/29/22) Goal status: In progress    LONG TERM GOALS: Target date: 05/29/22  Be independent in advanced HEP Baseline:  Goal status: INITIAL  2.  Perform 5x sit to stand in < or = to 18 seconds with use of hands to reduce falls risk Baseline: 31 seconds  Goal status: in progress  3.  Perform TUG in < or = to 45 seconds to reduce falls risk Baseline:  1 min, 15 seconds  Goal status: INITIAL  4.  Improve LE strength to demonstrate symmetry with gait on level surface with use of  rolling walker with min to mod UE support on rolling walker  Baseline:  Goal status: INITIAL  5.  Improve strength and endurance to stand for home tasks x 10-20 minutes without significant fatigue Baseline:  Goal status: INITIAL      PLAN: PT FREQUENCY: 2x/week  PT DURATION: 8 weeks  PLANNED INTERVENTIONS: Therapeutic exercises, Therapeutic activity, Neuromuscular re-education,  Balance training, Gait training, Patient/Family education, Joint mobilization, Stair training, Aquatic Therapy, Dry Needling, Electrical stimulation, Wheelchair mobility training, Cryotherapy, Moist heat, Taping, and Manual therapy  PLAN FOR NEXT SESSION: continue to work on mobility and strength, work on symmetry and endurance, gait and sit to stand.  Patient will be transitioning to pool next visit.   Stanton Kidney Weston) Autumm Hattery MPT 05/29/22 6:37 PM   [facility] [address], [place], [place] [address] Phone # [phone] Fax [phone]

## 2022-06-19 ENCOUNTER — Encounter (HOSPITAL_BASED_OUTPATIENT_CLINIC_OR_DEPARTMENT_OTHER): Payer: Self-pay | Admitting: Physical Therapy

## 2022-06-19 ENCOUNTER — Ambulatory Visit (HOSPITAL_BASED_OUTPATIENT_CLINIC_OR_DEPARTMENT_OTHER): Payer: Medicare Other | Attending: Orthopaedic Surgery | Admitting: Physical Therapy

## 2022-06-19 DIAGNOSIS — M6281 Muscle weakness (generalized): Secondary | ICD-10-CM | POA: Insufficient documentation

## 2022-06-19 DIAGNOSIS — M5442 Lumbago with sciatica, left side: Secondary | ICD-10-CM | POA: Diagnosis not present

## 2022-06-19 DIAGNOSIS — R293 Abnormal posture: Secondary | ICD-10-CM | POA: Diagnosis not present

## 2022-06-19 DIAGNOSIS — M25552 Pain in left hip: Secondary | ICD-10-CM | POA: Diagnosis not present

## 2022-06-19 DIAGNOSIS — M25551 Pain in right hip: Secondary | ICD-10-CM | POA: Insufficient documentation

## 2022-06-19 DIAGNOSIS — R2689 Other abnormalities of gait and mobility: Secondary | ICD-10-CM | POA: Insufficient documentation

## 2022-06-19 NOTE — Therapy (Signed)
OUTPATIENT PHYSICAL THERAPY TREATMENT NOTE   Patient Name: Jerry Gomez MRN: [id number] DOB:December 07, 1931, 88 y.o., male Today's Date: 06/19/2022  PCP: Cassandria Anger, MD REFERRING PROVIDER: Mcarthur Rossetti, MD  END OF SESSION:   PT End of Session - 06/19/22 1500     Visit Number 14    Date for PT Re-Evaluation 07/17/22    Authorization Type Medicare    Progress Note Due on Visit 20    PT Start Time 1500    PT Stop Time 1545    PT Time Calculation (min) 45 min    Activity Tolerance Patient tolerated treatment well    Behavior During Therapy WFL for tasks assessed/performed                Past Medical History:  Diagnosis Date   Cancer ([place])    CLL   Cataract    COLONIC POLYPS 03/02/2009   EYE SURGERY, HX OF 09/05/2007   HEMORRHOIDS, HX OF    HYPERLIPIDEMIA 09/05/2007   HYPERTENSION 07/25/2007   OSTEOARTHRITIS 09/05/2007   SLEEP APNEA, OBSTRUCTIVE 09/05/2007   Past Surgical History:  Procedure Laterality Date   EYE SURGERY  10/22/13   left eye, cataract   Patient Active Problem List   Diagnosis Date Noted   Somnolence, daytime 02/24/2022   Falls 01/31/2022   Orthostatic hypotension 01/18/2022   Difficulty walking with leg weakness  01/10/2022   Avascular necrosis of bones of both hips with severe OA of bilateral hips  01/10/2022   Bilateral lower extremity edema 01/10/2022   Leg weakness, bilateral 08/22/2021   Spinal stenosis 03/27/2021   Lumbar radiculopathy 03/12/2021   Acute left-sided low back pain with left-sided sciatica 02/21/2021   Insomnia 10/28/2018   Bradycardia 10/09/2016   Sebaceous cyst 10/09/2016   Band keratopathy of right eye 07/02/2016   Corneal dellen of right eye 07/02/2016   Carotid bruit 04/08/2016   Acute sinusitis 11/01/2015   Elevated PSA 04/10/2015   Well adult exam 10/10/2014   Bruising 10/10/2014   PCO (posterior capsular opacification) 10/27/2013   Nuclear cataract 07/29/2012   Pseudophakia 07/29/2012   CLL  (chronic lymphocytic leukemia) ([place]) 06/09/2012   Cystoid macular edema 06/03/2012   Epiretinal membrane 06/03/2012   Edema 02/07/2012   Pain of right lower leg 02/07/2012   Dyslipidemia 09/05/2007   SLEEP APNEA, OBSTRUCTIVE 09/05/2007   OSTEOARTHRITIS 09/05/2007   Essential hypertension 07/25/2007    REFERRING DIAG:  M16.12 (ICD-10-CM) - Unilateral primary osteoarthritis, left hip  M16.11 (ICD-10-CM) - Unilateral primary osteoarthritis, right hip    THERAPY DIAG:  Muscle weakness (generalized)  Other abnormalities of gait and mobility  Abnormal posture  Pain in left hip  Pain in right hip  Acute left-sided low back pain with left-sided sciatica  PERTINENT HISTORY: See above  PRECAUTIONS: fall  SUBJECTIVE: no changes, took a while to get another appointment"  PAIN:  Are you having pain? No   OBJECTIVE: (objective measures completed at initial evaluation unless otherwise dated)   OBJECTIVE: from evaluation on 4/256/23  DIAGNOSTIC FINDINGS: from MD note: He has complete loss of joint space on both sides and evidence of advanced osteoarthritis and likely component of osteonecrosis of both hips.   COGNITION:  Overall cognitive status: Within functional limits for tasks assessed     SENSATION: WFL  MUSCLE LENGTH: Bil hamstring length limited in sitting with reduced knee extension.   POSTURE:  Lt LE is flexed, forward head and flexed trunk.   PALPATION: NA  LE  ROM:  Not formally tested.  Pt with reduced hamstring length and IR/ER in sitting- limited by 25-50%  LE MMT:  MMT Right 04/11/2022 Left 04/11/2022  Hip flexion 4/5 4/5  Hip extension    Hip abduction 4-/5 4-/5  Hip adduction 4/5 4/5  Hip internal rotation    Hip external rotation    Knee flexion 4+/5 4+/5  Knee extension 4/5 4+/5  Ankle dorsiflexion 4+/5 4+/5  Ankle plantarflexion    Ankle inversion    Ankle eversion     (Blank rows = not tested)   FUNCTIONAL TESTS:  5 times sit to  stand: 31 seconds with use of arms Timed up and go (TUG): 1 min, 12 seconds with rolling walker   04/29/22:  5 times sit to stand: 30 seconds with arms and verbal cues for full erect posture.  Timed up and go (TUG): 1 min, 13 seconds with rolling walker  GAIT: Distance walked: 50 Assistive device utilized: Walker - 2 wheeled Level of assistance: CGA Comments: VC's for inceased step length and heel strike on the Lt LE, mod UE support on rolling walker  TODAY'S TREATMENT:  Treatment date 06/19/22: Pt seen for aquatic therapy today.  Water 3.25-4.8 ft in depth. Temp of water was 92.  Pt entered/exited the pool via lift.  STS from submerged lift chair with min-cg assist.   Walking forward, backward and side stepping using water walker.  Standing holding to wall: df; pf; marching; hip extension x 5-10 Seated LAQ 2 x10 R/L; add/abd 2x10 (difficulty tolerating and ROM ~ 25% of normal); SLR (flutter kicking) x10 STS from bench onto pool floor 2x5 ue supported water walker then hha.  Cues for weight shifting forward   Pt requires buoyancy for support and to offload joints with strengthening exercises. Viscosity of the water is needed for resistance of strengthening; water current perturbations provides challenge to standing balance unsupported, requiring increased core activation.        PATIENT EDUCATION:  Education details: Proper gait pattern  Person educated: Patient and Spouse Education method: Explanation, Demonstration Education comprehension: verbalized understanding and returned demonstration   HOME EXERCISE PROGRAM: Access Code: [email or web address] URL: [email or web address] Date: 04/03/2022 Prepared by: Claiborne Billings  Exercises - Seated Long Arc Quad  - 3 x daily - 7 x weekly - 2 sets - 10 reps - 5 hold - Seated March   - 3 x daily - 7 x weekly - 3 sets - 10 reps - Seated Heel Raise  - 3 x daily - 7 x weekly - 2 sets - 10 reps - Sit to Stand with Armchair  - 2 x daily - 7 x  weekly - 2 sets - 10 reps  Patient Education - Walking with a Standard Walker   ASSESSMENT:  CLINICAL IMPRESSION: Difficulty advancing rle with amb.  Able to complete step to pattern at best, 1/2 step. Highly difficult for pt to side step as he is very limited in abduction. He does not complain of any discomfort.  He is engaged for entire session, given 2 seated rest periods.  Improved ability to find center of buoyancy/ improved balance submerged.  Min-cga for forward amb, moderate for retro (greatly improved from last visit.). Crepitus appreciated in bilat hips with all hip movements.  He enjoys session. Goals ongoing.      OBJECTIVE IMPAIRMENTS Abnormal gait, decreased activity tolerance, decreased balance, decreased endurance, decreased mobility, difficulty walking, decreased ROM, decreased strength, hypomobility, impaired flexibility, and pain.   ACTIVITY LIMITATIONS community  activity, driving, and meal prep.   PERSONAL FACTORS Age and 1-2 comorbidities: bil hip OA, weakness, 2 falls  are also affecting patient's functional outcome.    REHAB POTENTIAL: Good  CLINICAL DECISION MAKING: Stable/uncomplicated  EVALUATION COMPLEXITY: Low   GOALS: Goals reviewed with patient? Yes  SHORT TERM GOALS: Target date: 05/01/22  Be independent in initial HEP Baseline: Goal status: MET (04/29/22)  2.  Perform 5x sit to stand in < or = to 21 seconds with use of arms to reduce falls risk. Baseline: 30 (04/29/22) Goal status: In progress   3.  Demonstrate Lt foot flat in stance phase of gait > or = to 50% of the time with rolling walker  Baseline:  Goal status: In progress   4.  Perform TUG in < or = to 55 seconds to reduce falls risk Baseline:  1 min, 13 seconds (04/29/22) Goal status: In progress    LONG TERM GOALS: Target date: 05/29/22  Be independent in advanced HEP Baseline:  Goal status: INITIAL  2.  Perform 5x sit to stand in < or = to 18 seconds with use of hands to  reduce falls risk Baseline: 31 seconds  Goal status: in progress  3.  Perform TUG in < or = to 45 seconds to reduce falls risk Baseline:  1 min, 15 seconds  Goal status: INITIAL  4.  Improve LE strength to demonstrate symmetry with gait on level surface with use of rolling walker with min to mod UE support on rolling walker  Baseline:  Goal status: INITIAL  5.  Improve strength and endurance to stand for home tasks x 10-20 minutes without significant fatigue Baseline:  Goal status: INITIAL      PLAN: PT FREQUENCY: 2x/week  PT DURATION: 8 weeks  PLANNED INTERVENTIONS: Therapeutic exercises, Therapeutic activity, Neuromuscular re-education, Balance training, Gait training, Patient/Family education, Joint mobilization, Stair training, Aquatic Therapy, Dry Needling, Electrical stimulation, Wheelchair mobility training, Cryotherapy, Moist heat, Taping, and Manual therapy  PLAN FOR NEXT SESSION: continue to work on mobility and strength, work on symmetry and endurance, gait and sit to stand.  Patient will be transitioning to pool next visit.   Stanton Kidney Tharon Aquas) Sadia Belfiore MPT 06/19/22 6:23 PM

## 2022-06-26 ENCOUNTER — Ambulatory Visit (HOSPITAL_BASED_OUTPATIENT_CLINIC_OR_DEPARTMENT_OTHER): Payer: Medicare Other | Admitting: Physical Therapy

## 2022-06-26 ENCOUNTER — Encounter (HOSPITAL_BASED_OUTPATIENT_CLINIC_OR_DEPARTMENT_OTHER): Payer: Self-pay | Admitting: Physical Therapy

## 2022-06-26 DIAGNOSIS — M5442 Lumbago with sciatica, left side: Secondary | ICD-10-CM | POA: Diagnosis not present

## 2022-06-26 DIAGNOSIS — R293 Abnormal posture: Secondary | ICD-10-CM

## 2022-06-26 DIAGNOSIS — R2689 Other abnormalities of gait and mobility: Secondary | ICD-10-CM | POA: Diagnosis not present

## 2022-06-26 DIAGNOSIS — M6281 Muscle weakness (generalized): Secondary | ICD-10-CM

## 2022-06-26 DIAGNOSIS — M25551 Pain in right hip: Secondary | ICD-10-CM | POA: Diagnosis not present

## 2022-06-26 DIAGNOSIS — M25552 Pain in left hip: Secondary | ICD-10-CM

## 2022-06-26 NOTE — Therapy (Signed)
OUTPATIENT PHYSICAL THERAPY TREATMENT NOTE   Patient Name: Jerry Gomez MRN: [id number] DOB:1931/03/11, 88 y.o., male Today's Date: 06/26/2022  PCP: Cassandria Anger, MD REFERRING PROVIDER: Mcarthur Rossetti, MD  END OF SESSION:   PT End of Session - 06/26/22 1506     Visit Number 15    Date for PT Re-Evaluation 07/17/22    Authorization Type Medicare    Progress Note Due on Visit 20    PT Start Time 1501    PT Stop Time 1545    PT Time Calculation (min) 44 min    Activity Tolerance Patient tolerated treatment well    Behavior During Therapy WFL for tasks assessed/performed                 Past Medical History:  Diagnosis Date   Cancer ([place])    CLL   Cataract    COLONIC POLYPS 03/02/2009   EYE SURGERY, HX OF 09/05/2007   HEMORRHOIDS, HX OF    HYPERLIPIDEMIA 09/05/2007   HYPERTENSION 07/25/2007   OSTEOARTHRITIS 09/05/2007   SLEEP APNEA, OBSTRUCTIVE 09/05/2007   Past Surgical History:  Procedure Laterality Date   EYE SURGERY  10/22/13   left eye, cataract   Patient Active Problem List   Diagnosis Date Noted   Somnolence, daytime 02/24/2022   Falls 01/31/2022   Orthostatic hypotension 01/18/2022   Difficulty walking with leg weakness  01/10/2022   Avascular necrosis of bones of both hips with severe OA of bilateral hips  01/10/2022   Bilateral lower extremity edema 01/10/2022   Leg weakness, bilateral 08/22/2021   Spinal stenosis 03/27/2021   Lumbar radiculopathy 03/12/2021   Acute left-sided low back pain with left-sided sciatica 02/21/2021   Insomnia 10/28/2018   Bradycardia 10/09/2016   Sebaceous cyst 10/09/2016   Band keratopathy of right eye 07/02/2016   Corneal dellen of right eye 07/02/2016   Carotid bruit 04/08/2016   Acute sinusitis 11/01/2015   Elevated PSA 04/10/2015   Well adult exam 10/10/2014   Bruising 10/10/2014   PCO (posterior capsular opacification) 10/27/2013   Nuclear cataract 07/29/2012   Pseudophakia 07/29/2012   CLL  (chronic lymphocytic leukemia) ([place]) 06/09/2012   Cystoid macular edema 06/03/2012   Epiretinal membrane 06/03/2012   Edema 02/07/2012   Pain of right lower leg 02/07/2012   Dyslipidemia 09/05/2007   SLEEP APNEA, OBSTRUCTIVE 09/05/2007   OSTEOARTHRITIS 09/05/2007   Essential hypertension 07/25/2007    REFERRING DIAG:  M16.12 (ICD-10-CM) - Unilateral primary osteoarthritis, left hip  M16.11 (ICD-10-CM) - Unilateral primary osteoarthritis, right hip    THERAPY DIAG:  Muscle weakness (generalized)  Other abnormalities of gait and mobility  Abnormal posture  Pain in left hip  Pain in right hip  PERTINENT HISTORY: See above  PRECAUTIONS: fall  SUBJECTIVE: "felt pretty good after last session"  PAIN:  Are you having pain? No   OBJECTIVE: (objective measures completed at initial evaluation unless otherwise dated)   OBJECTIVE: from evaluation on 4/256/23  DIAGNOSTIC FINDINGS: from MD note: He has complete loss of joint space on both sides and evidence of advanced osteoarthritis and likely component of osteonecrosis of both hips.   COGNITION:  Overall cognitive status: Within functional limits for tasks assessed     SENSATION: WFL  MUSCLE LENGTH: Bil hamstring length limited in sitting with reduced knee extension.   POSTURE:  Lt LE is flexed, forward head and flexed trunk.   PALPATION: NA  LE ROM:  Not formally tested.  Pt with reduced hamstring length  and IR/ER in sitting- limited by 25-50%  LE MMT:  MMT Right 04/11/2022 Left 04/11/2022  Hip flexion 4/5 4/5  Hip extension    Hip abduction 4-/5 4-/5  Hip adduction 4/5 4/5  Hip internal rotation    Hip external rotation    Knee flexion 4+/5 4+/5  Knee extension 4/5 4+/5  Ankle dorsiflexion 4+/5 4+/5  Ankle plantarflexion    Ankle inversion    Ankle eversion     (Blank rows = not tested)   FUNCTIONAL TESTS:  5 times sit to stand: 31 seconds with use of arms Timed up and go (TUG): 1 min, 12 seconds  with rolling walker   04/29/22:  5 times sit to stand: 30 seconds with arms and verbal cues for full erect posture.  Timed up and go (TUG): 1 min, 13 seconds with rolling walker  GAIT: Distance walked: 50 Assistive device utilized: Walker - 2 wheeled Level of assistance: CGA Comments: VC's for inceased step length and heel strike on the Lt LE, mod UE support on rolling walker  TODAY'S TREATMENT:  Treatment date 06/26/22: Pt seen for aquatic therapy today.  Water 3.25-4.8 ft in depth. Temp of water was 92.  Pt entered/exited the pool via lift. Mod to min asst wc<>lift  STS from submerged lift chair with cg assist.   Walking forward, backward and side stepping using water walker. Cga with forward, min assist with back Seated on bench: cycling x 20; add/abd in available range x20; flutter x 20 STS from bench onto pool floor x10 ue supported water walker with decreasing support width gaining immediate standing balance 6/10. STS onto water step x 5.  Completes 2/5 rising gaining immediate standing balance indep Cues for weight shifting forward Standing holding to wall: df; pf; marching; hip extension x 5-10 Supine suspension briefly supported by therapist: flutter kicking; add/abd   Pt requires buoyancy for support and to offload joints with strengthening exercises. Viscosity of the water is needed for resistance of strengthening; water current perturbations provides challenge to standing balance unsupported, requiring increased core activation.        PATIENT EDUCATION:  Education details: Proper gait pattern  Person educated: Patient and Spouse Education method: Explanation, Demonstration Education comprehension: verbalized understanding and returned demonstration   HOME EXERCISE PROGRAM: Access Code: [email or web address] URL: [email or web address] Date: 04/03/2022 Prepared by: Claiborne Billings  Exercises - Seated Long Arc Quad  - 3 x daily - 7 x weekly - 2 sets - 10 reps - 5 hold -  Seated March   - 3 x daily - 7 x weekly - 3 sets - 10 reps - Seated Heel Raise  - 3 x daily - 7 x weekly - 2 sets - 10 reps - Sit to Stand with Armchair  - 2 x daily - 7 x weekly - 2 sets - 10 reps  Patient Education - Walking with a Standard Walker   ASSESSMENT:  CLINICAL IMPRESSION: Progressed strengthening with decreased submersion with STS onto water step. Improvment with gaining immediate standing balance with STS.  Decreased assistance needed with weight shifting and getting in and out of lift chair. Significant bilat hip crepitus with STS movement. Very limited in abd. Good progress   OBJECTIVE IMPAIRMENTS Abnormal gait, decreased activity tolerance, decreased balance, decreased endurance, decreased mobility, difficulty walking, decreased ROM, decreased strength, hypomobility, impaired flexibility, and pain.   ACTIVITY LIMITATIONS community activity, driving, and meal prep.   PERSONAL FACTORS Age and 1-2 comorbidities: bil hip OA, weakness, 2 falls  are also affecting patient's functional outcome.    REHAB POTENTIAL: Good  CLINICAL DECISION MAKING: Stable/uncomplicated  EVALUATION COMPLEXITY: Low   GOALS: Goals reviewed with patient? Yes  SHORT TERM GOALS: Target date: 05/01/22  Be independent in initial HEP Baseline: Goal status: MET (04/29/22)  2.  Perform 5x sit to stand in < or = to 21 seconds with use of arms to reduce falls risk. Baseline: 30 (04/29/22) Goal status: In progress   3.  Demonstrate Lt foot flat in stance phase of gait > or = to 50% of the time with rolling walker  Baseline:  Goal status: In progress   4.  Perform TUG in < or = to 55 seconds to reduce falls risk Baseline:  1 min, 13 seconds (04/29/22) Goal status: In progress    LONG TERM GOALS: Target date: 07/17/22  Be independent in advanced HEP Baseline:  Goal status: INITIAL  2.  Perform 5x sit to stand in < or = to 18 seconds with use of hands to reduce falls risk Baseline: 31 seconds   Goal status: in progress  3.  Perform TUG in < or = to 45 seconds to reduce falls risk Baseline:  1 min, 15 seconds  Goal status: INITIAL  4.  Improve LE strength to demonstrate symmetry with gait on level surface with use of rolling walker with min to mod UE support on rolling walker  Baseline:  Goal status: INITIAL  5.  Improve strength and endurance to stand for home tasks x 10-20 minutes without significant fatigue Baseline:  Goal status: INITIAL      PLAN: PT FREQUENCY: 2x/week  PT DURATION: 8 weeks  PLANNED INTERVENTIONS: Therapeutic exercises, Therapeutic activity, Neuromuscular re-education, Balance training, Gait training, Patient/Family education, Joint mobilization, Stair training, Aquatic Therapy, Dry Needling, Electrical stimulation, Wheelchair mobility training, Cryotherapy, Moist heat, Taping, and Manual therapy  PLAN FOR NEXT SESSION: continue to work on mobility and strength, work on symmetry and endurance, gait and sit to stand.  Patient will be transitioning to pool next visit.   Stanton Kidney Tharon Aquas) Kamerin Grumbine MPT 06/26/22 6:16 PM

## 2022-07-03 ENCOUNTER — Ambulatory Visit (HOSPITAL_BASED_OUTPATIENT_CLINIC_OR_DEPARTMENT_OTHER): Payer: Medicare Other | Admitting: Physical Therapy

## 2022-07-03 ENCOUNTER — Encounter (HOSPITAL_BASED_OUTPATIENT_CLINIC_OR_DEPARTMENT_OTHER): Payer: Self-pay | Admitting: Physical Therapy

## 2022-07-03 DIAGNOSIS — M6281 Muscle weakness (generalized): Secondary | ICD-10-CM

## 2022-07-03 DIAGNOSIS — M25551 Pain in right hip: Secondary | ICD-10-CM

## 2022-07-03 DIAGNOSIS — M25552 Pain in left hip: Secondary | ICD-10-CM

## 2022-07-03 DIAGNOSIS — R2689 Other abnormalities of gait and mobility: Secondary | ICD-10-CM

## 2022-07-03 DIAGNOSIS — R293 Abnormal posture: Secondary | ICD-10-CM

## 2022-07-03 DIAGNOSIS — M5442 Lumbago with sciatica, left side: Secondary | ICD-10-CM | POA: Diagnosis not present

## 2022-07-03 NOTE — Therapy (Signed)
OUTPATIENT PHYSICAL THERAPY TREATMENT NOTE   Patient Name: Jerry Gomez MRN: [id number] DOB:[birth year], 88 y.o., male Today's Date: 07/03/2022  PCP: Cassandria Anger, MD REFERRING PROVIDER: Mcarthur Rossetti, MD  END OF SESSION:   PT End of Session - 07/03/22 1453     Visit Number 16    Date for PT Re-Evaluation 07/17/22    Authorization Type Medicare    Progress Note Due on Visit 20    PT Start Time 1500    PT Stop Time 1545    PT Time Calculation (min) 45 min    Activity Tolerance Patient tolerated treatment well    Behavior During Therapy WFL for tasks assessed/performed                 Past Medical History:  Diagnosis Date   Cancer ([place])    CLL   Cataract    COLONIC POLYPS 03/02/2009   EYE SURGERY, HX OF 09/05/2007   HEMORRHOIDS, HX OF    HYPERLIPIDEMIA 09/05/2007   HYPERTENSION 07/25/2007   OSTEOARTHRITIS 09/05/2007   SLEEP APNEA, OBSTRUCTIVE 09/05/2007   Past Surgical History:  Procedure Laterality Date   EYE SURGERY  10/22/13   left eye, cataract   Patient Active Problem List   Diagnosis Date Noted   Somnolence, daytime 02/24/2022   Falls 01/31/2022   Orthostatic hypotension 01/18/2022   Difficulty walking with leg weakness  01/10/2022   Avascular necrosis of bones of both hips with severe OA of bilateral hips  01/10/2022   Bilateral lower extremity edema 01/10/2022   Leg weakness, bilateral 08/22/2021   Spinal stenosis 03/27/2021   Lumbar radiculopathy 03/12/2021   Acute left-sided low back pain with left-sided sciatica 02/21/2021   Insomnia 10/28/2018   Bradycardia 10/09/2016   Sebaceous cyst 10/09/2016   Band keratopathy of right eye 07/02/2016   Corneal dellen of right eye 07/02/2016   Carotid bruit 04/08/2016   Acute sinusitis 11/01/2015   Elevated PSA 04/10/2015   Well adult exam 10/10/2014   Bruising 10/10/2014   PCO (posterior capsular opacification) 10/27/2013   Nuclear cataract 07/29/2012   Pseudophakia 07/29/2012   CLL  (chronic lymphocytic leukemia) ([place]) 06/09/2012   Cystoid macular edema 06/03/2012   Epiretinal membrane 06/03/2012   Edema 02/07/2012   Pain of right lower leg 02/07/2012   Dyslipidemia 09/05/2007   SLEEP APNEA, OBSTRUCTIVE 09/05/2007   OSTEOARTHRITIS 09/05/2007   Essential hypertension 07/25/2007    REFERRING DIAG:  M16.12 (ICD-10-CM) - Unilateral primary osteoarthritis, left hip  M16.11 (ICD-10-CM) - Unilateral primary osteoarthritis, right hip    THERAPY DIAG:  Muscle weakness (generalized)  Other abnormalities of gait and mobility  Abnormal posture  Pain in left hip  Pain in right hip  PERTINENT HISTORY: See above  PRECAUTIONS: fall  SUBJECTIVE: "felt pretty good after last session"  PAIN:  Are you having pain? No   OBJECTIVE: (objective measures completed at initial evaluation unless otherwise dated)   OBJECTIVE: from evaluation on 4/256/23  DIAGNOSTIC FINDINGS: from MD note: He has complete loss of joint space on both sides and evidence of advanced osteoarthritis and likely component of osteonecrosis of both hips.   COGNITION:  Overall cognitive status: Within functional limits for tasks assessed     SENSATION: WFL  MUSCLE LENGTH: Bil hamstring length limited in sitting with reduced knee extension.   POSTURE:  Lt LE is flexed, forward head and flexed trunk.   PALPATION: NA  LE ROM:  Not formally tested.  Pt with reduced hamstring length  and IR/ER in sitting- limited by 25-50%  LE MMT:  MMT Right 04/11/2022 Left 04/11/2022  Hip flexion 4/5 4/5  Hip extension    Hip abduction 4-/5 4-/5  Hip adduction 4/5 4/5  Hip internal rotation    Hip external rotation    Knee flexion 4+/5 4+/5  Knee extension 4/5 4+/5  Ankle dorsiflexion 4+/5 4+/5  Ankle plantarflexion    Ankle inversion    Ankle eversion     (Blank rows = not tested)   FUNCTIONAL TESTS:  5 times sit to stand: 31 seconds with use of arms Timed up and go (TUG): 1 min, 12 seconds  with rolling walker   04/29/22:  5 times sit to stand: 30 seconds with arms and verbal cues for full erect posture.  Timed up and go (TUG): 1 min, 13 seconds with rolling walker  GAIT: Distance walked: 50 Assistive device utilized: Walker - 2 wheeled Level of assistance: CGA Comments: VC's for inceased step length and heel strike on the Lt LE, mod UE support on rolling walker  TODAY'S TREATMENT:  Treatment date 07/03/22: Pt seen for aquatic therapy today.  Water 3.25-4.8 ft in depth. Temp of water was 92.  Pt entered/exited the pool via lift. Mod to min asst wc<>lift  Seated on bench: cycling x 20; add/abd in available range x20; flutter x 20; LAQ x20 Walking forward, backward and side stepping using yellow noodle. Cga with forward, min assist with back Dynamic balance challenges with amb, therapist releasing ue support, cuing pt on controlling gait speed, standing erect Standing holding to wall: df; pf; x20: marching; hip extension; squats x 10. Hip circle CW and CCW x 10 Supine suspension  supported by therapist: flutter kicking; add/abd; cycling Gentle snaking and "dolphin"  through water in above position for core elongation/stretching  Pt requires buoyancy for support and to offload joints with strengthening exercises. Viscosity of the water is needed for resistance of strengthening; water current perturbations provides challenge to standing balance unsupported, requiring increased core activation.        PATIENT EDUCATION:  Education details: Proper gait pattern  Person educated: Patient and Spouse Education method: Explanation, Demonstration Education comprehension: verbalized understanding and returned demonstration   HOME EXERCISE PROGRAM: Access Code: [email or web address] URL: [email or web address] Date: 04/03/2022 Prepared by: Claiborne Billings  Exercises - Seated Long Arc Quad  - 3 x daily - 7 x weekly - 2 sets - 10 reps - 5 hold - Seated March   - 3 x daily - 7 x weekly  - 3 sets - 10 reps - Seated Heel Raise  - 3 x daily - 7 x weekly - 2 sets - 10 reps - Sit to Stand with Armchair  - 2 x daily - 7 x weekly - 2 sets - 10 reps  Patient Education - Walking with a Standard Walker   ASSESSMENT:  CLINICAL IMPRESSION: Added multiplanar unloaded hip movements in standing which he tolerates well.  Dynamic standing balance progressing as pt able to amb with decrease ue support to yellow noodle as well as decreased assistance with forward walking. Left side bending more limited than right with some discomfort in right hip with snaking.  He was able to gain increased core elongation with suspended supine maneuvering. He continues to be very limited in R and L hip abd    OBJECTIVE IMPAIRMENTS Abnormal gait, decreased activity tolerance, decreased balance, decreased endurance, decreased mobility, difficulty walking, decreased ROM, decreased strength, hypomobility, impaired flexibility, and pain.   ACTIVITY  LIMITATIONS community activity, driving, and meal prep.   PERSONAL FACTORS Age and 1-2 comorbidities: bil hip OA, weakness, 2 falls  are also affecting patient's functional outcome.    REHAB POTENTIAL: Good  CLINICAL DECISION MAKING: Stable/uncomplicated  EVALUATION COMPLEXITY: Low   GOALS: Goals reviewed with patient? Yes  SHORT TERM GOALS: Target date: 05/01/22  Be independent in initial HEP Baseline: Goal status: MET (04/29/22)  2.  Perform 5x sit to stand in < or = to 21 seconds with use of arms to reduce falls risk. Baseline: 30 (04/29/22) Goal status: In progress   3.  Demonstrate Lt foot flat in stance phase of gait > or = to 50% of the time with rolling walker  Baseline:  Goal status: In progress   4.  Perform TUG in < or = to 55 seconds to reduce falls risk Baseline:  1 min, 13 seconds (04/29/22) Goal status: In progress    LONG TERM GOALS: Target date: 07/17/22  Be independent in advanced HEP Baseline:  Goal status: INITIAL  2.   Perform 5x sit to stand in < or = to 18 seconds with use of hands to reduce falls risk Baseline: 31 seconds  Goal status: in progress  3.  Perform TUG in < or = to 45 seconds to reduce falls risk Baseline:  1 min, 15 seconds  Goal status: INITIAL  4.  Improve LE strength to demonstrate symmetry with gait on level surface with use of rolling walker with min to mod UE support on rolling walker  Baseline:  Goal status: INITIAL  5.  Improve strength and endurance to stand for home tasks x 10-20 minutes without significant fatigue Baseline:  Goal status: INITIAL      PLAN: PT FREQUENCY: 2x/week  PT DURATION: 8 weeks  PLANNED INTERVENTIONS: Therapeutic exercises, Therapeutic activity, Neuromuscular re-education, Balance training, Gait training, Patient/Family education, Joint mobilization, Stair training, Aquatic Therapy, Dry Needling, Electrical stimulation, Wheelchair mobility training, Cryotherapy, Moist heat, Taping, and Manual therapy  PLAN FOR NEXT SESSION: continue to work on mobility and strength, work on symmetry and endurance, gait and sit to stand.  Patient will be transitioning to pool next visit.   Annamarie Major) Geffrey Michaelsen MPT 07/03/22 6:43 PM

## 2022-07-10 ENCOUNTER — Encounter (HOSPITAL_BASED_OUTPATIENT_CLINIC_OR_DEPARTMENT_OTHER): Payer: Self-pay | Admitting: Physical Therapy

## 2022-07-10 ENCOUNTER — Ambulatory Visit (HOSPITAL_BASED_OUTPATIENT_CLINIC_OR_DEPARTMENT_OTHER): Payer: Medicare Other | Attending: Orthopaedic Surgery | Admitting: Physical Therapy

## 2022-07-10 DIAGNOSIS — R293 Abnormal posture: Secondary | ICD-10-CM | POA: Insufficient documentation

## 2022-07-10 DIAGNOSIS — M25551 Pain in right hip: Secondary | ICD-10-CM | POA: Diagnosis not present

## 2022-07-10 DIAGNOSIS — R2689 Other abnormalities of gait and mobility: Secondary | ICD-10-CM | POA: Insufficient documentation

## 2022-07-10 DIAGNOSIS — M25552 Pain in left hip: Secondary | ICD-10-CM | POA: Insufficient documentation

## 2022-07-10 DIAGNOSIS — M6281 Muscle weakness (generalized): Secondary | ICD-10-CM | POA: Diagnosis not present

## 2022-07-10 NOTE — Therapy (Signed)
OUTPATIENT PHYSICAL THERAPY TREATMENT NOTE   Patient Name: Jerry Gomez MRN: [id number] DOB:Oct 21, 1931, 88 y.o., male Today's Date: 07/10/2022  PCP: Cassandria Anger, MD REFERRING PROVIDER: Mcarthur Rossetti, MD  END OF SESSION:   PT End of Session - 07/10/22 1535     Visit Number 17    Date for PT Re-Evaluation 07/17/22    Authorization Type Medicare    Progress Note Due on Visit 20    PT Start Time 3875    PT Stop Time 1545    PT Time Calculation (min) 40 min    Activity Tolerance Patient tolerated treatment well    Behavior During Therapy WFL for tasks assessed/performed                  Past Medical History:  Diagnosis Date   Cancer ([place])    CLL   Cataract    COLONIC POLYPS 03/02/2009   EYE SURGERY, HX OF 09/05/2007   HEMORRHOIDS, HX OF    HYPERLIPIDEMIA 09/05/2007   HYPERTENSION 07/25/2007   OSTEOARTHRITIS 09/05/2007   SLEEP APNEA, OBSTRUCTIVE 09/05/2007   Past Surgical History:  Procedure Laterality Date   EYE SURGERY  10/22/13   left eye, cataract   Patient Active Problem List   Diagnosis Date Noted   Somnolence, daytime 02/24/2022   Falls 01/31/2022   Orthostatic hypotension 01/18/2022   Difficulty walking with leg weakness  01/10/2022   Avascular necrosis of bones of both hips with severe OA of bilateral hips  01/10/2022   Bilateral lower extremity edema 01/10/2022   Leg weakness, bilateral 08/22/2021   Spinal stenosis 03/27/2021   Lumbar radiculopathy 03/12/2021   Acute left-sided low back pain with left-sided sciatica 02/21/2021   Insomnia 10/28/2018   Bradycardia 10/09/2016   Sebaceous cyst 10/09/2016   Band keratopathy of right eye 07/02/2016   Corneal dellen of right eye 07/02/2016   Carotid bruit 04/08/2016   Acute sinusitis 11/01/2015   Elevated PSA 04/10/2015   Well adult exam 10/10/2014   Bruising 10/10/2014   PCO (posterior capsular opacification) 10/27/2013   Nuclear cataract 07/29/2012   Pseudophakia 07/29/2012   CLL  (chronic lymphocytic leukemia) ([place]) 06/09/2012   Cystoid macular edema 06/03/2012   Epiretinal membrane 06/03/2012   Edema 02/07/2012   Pain of right lower leg 02/07/2012   Dyslipidemia 09/05/2007   SLEEP APNEA, OBSTRUCTIVE 09/05/2007   OSTEOARTHRITIS 09/05/2007   Essential hypertension 07/25/2007    REFERRING DIAG:  M16.12 (ICD-10-CM) - Unilateral primary osteoarthritis, left hip  M16.11 (ICD-10-CM) - Unilateral primary osteoarthritis, right hip    THERAPY DIAG:  Muscle weakness (generalized)  Other abnormalities of gait and mobility  Abnormal posture  Pain in left hip  Pain in right hip  PERTINENT HISTORY: See above  PRECAUTIONS: fall  SUBJECTIVE: "think I'm moving around a little better""  PAIN:  Are you having pain? No   OBJECTIVE: (objective measures completed at initial evaluation unless otherwise dated)   OBJECTIVE: from evaluation on 4/256/23  DIAGNOSTIC FINDINGS: from MD note: He has complete loss of joint space on both sides and evidence of advanced osteoarthritis and likely component of osteonecrosis of both hips.   COGNITION:  Overall cognitive status: Within functional limits for tasks assessed     SENSATION: WFL  MUSCLE LENGTH: Bil hamstring length limited in sitting with reduced knee extension.   POSTURE:  Lt LE is flexed, forward head and flexed trunk.   PALPATION: NA  LE ROM:  Not formally tested.  Pt with reduced  hamstring length and IR/ER in sitting- limited by 25-50%  LE MMT:  MMT Right 04/11/2022 Left 04/11/2022  Hip flexion 4/5 4/5  Hip extension    Hip abduction 4-/5 4-/5  Hip adduction 4/5 4/5  Hip internal rotation    Hip external rotation    Knee flexion 4+/5 4+/5  Knee extension 4/5 4+/5  Ankle dorsiflexion 4+/5 4+/5  Ankle plantarflexion    Ankle inversion    Ankle eversion     (Blank rows = not tested)   FUNCTIONAL TESTS:  5 times sit to stand: 31 seconds with use of arms Timed up and go (TUG): 1 min, 12  seconds with rolling walker   04/29/22:  5 times sit to stand: 30 seconds with arms and verbal cues for full erect posture.  Timed up and go (TUG): 1 min, 13 seconds with rolling walker  GAIT: Distance walked: 50 Assistive device utilized: Walker - 2 wheeled Level of assistance: CGA Comments: VC's for inceased step length and heel strike on the Lt LE, mod UE support on rolling walker  TODAY'S TREATMENT:  Treatment date 07/03/22: Pt seen for aquatic therapy today.  Water 3.25-4.8 ft in depth. Temp of water was 92.  Pt entered/exited the pool via lift. Min -cgasst wc<>lift   Walking forward, backward and side stepping using yellow noodle. Cga with forward, min-cg assist with back, mod side stepping multiple widths encouraging indep balance (core strengthening). Dynamic balance challenges with amb, decreasing therapist support, cuing pt on controlling gait speed, standing erect. Particular difficulty with backward walking. Standing holding to wall: df; pf; x20: marching; hip extension; squats x 10. Hip circle CW and CCW x 10 Seated cycling; LAQ; add/abd and flutter  x 10-20 as tolerated STS from bench onto pool floor yellow noodle for support x 5; Onto water step x 5.  Cues for immediate standing balance/weight shift/execution  Pt requires buoyancy for support and to offload joints with strengthening exercises. Viscosity of the water is needed for resistance of strengthening; water current perturbations provides challenge to standing balance unsupported, requiring increased core activation.        PATIENT EDUCATION:  Education details: Proper gait pattern  Person educated: Patient and Spouse Education method: Explanation, Demonstration Education comprehension: verbalized understanding and returned demonstration   HOME EXERCISE PROGRAM: Access Code: [email or web address] URL: [email or web address] Date: 04/03/2022 Prepared by: Claiborne Billings  Exercises - Seated Long Arc Quad  - 3 x  daily - 7 x weekly - 2 sets - 10 reps - 5 hold - Seated March   - 3 x daily - 7 x weekly - 3 sets - 10 reps - Seated Heel Raise  - 3 x daily - 7 x weekly - 2 sets - 10 reps - Sit to Stand with Armchair  - 2 x daily - 7 x weekly - 2 sets - 10 reps  Patient Education - Walking with a Standard Walker   ASSESSMENT:  CLINICAL IMPRESSION: Pt with improving mobility in hips submerged. Crepitus continues L>R.  He is not limited to pain although I am not aggressive with ROM particularly abd bilat. Decreased assistance with transfer wc to and from lift min-cga. Slight improvements with gaining immediate stand balance rising from bench to pool floor.  Unable to gain indep rising onto step. Pt remains in motion x 35-40 minutes without increase in pain or excessive fatigue. He tolerates very well and enjoys session.  Added multiplanar unloaded hip movements in standing which he tolerates well.  Dynamic standing balance  progressing as pt able to amb with decrease ue support to yellow noodle as well as decreased assistance with forward walking. Left side bending more limited than right with some discomfort in right hip with snaking.  He was able to gain increased core elongation with suspended supine maneuvering. He continues to be very limited in R and L hip abd    OBJECTIVE IMPAIRMENTS Abnormal gait, decreased activity tolerance, decreased balance, decreased endurance, decreased mobility, difficulty walking, decreased ROM, decreased strength, hypomobility, impaired flexibility, and pain.   ACTIVITY LIMITATIONS community activity, driving, and meal prep.   PERSONAL FACTORS Age and 1-2 comorbidities: bil hip OA, weakness, 2 falls  are also affecting patient's functional outcome.    REHAB POTENTIAL: Good  CLINICAL DECISION MAKING: Stable/uncomplicated  EVALUATION COMPLEXITY: Low   GOALS: Goals reviewed with patient? Yes  SHORT TERM GOALS: Target date: 05/01/22  Be independent in initial  HEP Baseline: Goal status: MET (04/29/22)  2.  Perform 5x sit to stand in < or = to 21 seconds with use of arms to reduce falls risk. Baseline: 30 (04/29/22) Goal status: In progress   3.  Demonstrate Lt foot flat in stance phase of gait > or = to 50% of the time with rolling walker  Baseline:  Goal status: In progress   4.  Perform TUG in < or = to 55 seconds to reduce falls risk Baseline:  1 min, 13 seconds (04/29/22) Goal status: In progress    LONG TERM GOALS: Target date: 07/17/22  Be independent in advanced HEP Baseline:  Goal status: INITIAL  2.  Perform 5x sit to stand in < or = to 18 seconds with use of hands to reduce falls risk Baseline: 31 seconds  Goal status: in progress  3.  Perform TUG in < or = to 45 seconds to reduce falls risk Baseline:  1 min, 15 seconds  Goal status: INITIAL  4.  Improve LE strength to demonstrate symmetry with gait on level surface with use of rolling walker with min to mod UE support on rolling walker  Baseline:  Goal status: INITIAL  5.  Improve strength and endurance to stand for home tasks x 10-20 minutes without significant fatigue Baseline:  Goal status: INITIAL      PLAN: PT FREQUENCY: 2x/week  PT DURATION: 8 weeks  PLANNED INTERVENTIONS: Therapeutic exercises, Therapeutic activity, Neuromuscular re-education, Balance training, Gait training, Patient/Family education, Joint mobilization, Stair training, Aquatic Therapy, Dry Needling, Electrical stimulation, Wheelchair mobility training, Cryotherapy, Moist heat, Taping, and Manual therapy  PLAN FOR NEXT SESSION: continue to work on mobility and strength, work on symmetry and endurance, gait and sit to stand.  Patient will be transitioning to pool next visit.   Stanton Kidney Tharon Aquas) Caidence Kaseman MPT 07/10/22 3:36 PM

## 2022-07-17 ENCOUNTER — Encounter (HOSPITAL_BASED_OUTPATIENT_CLINIC_OR_DEPARTMENT_OTHER): Payer: Self-pay | Admitting: Physical Therapy

## 2022-07-17 ENCOUNTER — Ambulatory Visit (HOSPITAL_BASED_OUTPATIENT_CLINIC_OR_DEPARTMENT_OTHER): Payer: Medicare Other | Admitting: Physical Therapy

## 2022-07-17 DIAGNOSIS — M25551 Pain in right hip: Secondary | ICD-10-CM

## 2022-07-17 DIAGNOSIS — M25552 Pain in left hip: Secondary | ICD-10-CM | POA: Diagnosis not present

## 2022-07-17 DIAGNOSIS — R293 Abnormal posture: Secondary | ICD-10-CM | POA: Diagnosis not present

## 2022-07-17 DIAGNOSIS — M6281 Muscle weakness (generalized): Secondary | ICD-10-CM | POA: Diagnosis not present

## 2022-07-17 DIAGNOSIS — R2689 Other abnormalities of gait and mobility: Secondary | ICD-10-CM

## 2022-07-17 NOTE — Therapy (Signed)
OUTPATIENT PHYSICAL THERAPY TREATMENT NOTE   Patient Name: Jerry Gomez MRN: [id number] DOB:11-May-1931, 88 y.o., male Today's Date: 07/17/2022  PCP: Cassandria Anger, MD REFERRING PROVIDER: Mcarthur Rossetti, MD  END OF SESSION:   PT End of Session - 07/17/22 1520     Visit Number 18    Date for PT Re-Evaluation 07/17/22    Authorization Type Medicare    Progress Note Due on Visit 20    PT Start Time 0240    PT Stop Time 1545    PT Time Calculation (min) 39 min    Activity Tolerance Patient tolerated treatment well    Behavior During Therapy WFL for tasks assessed/performed                   Past Medical History:  Diagnosis Date   Cancer ([place])    CLL   Cataract    COLONIC POLYPS 03/02/2009   EYE SURGERY, HX OF 09/05/2007   HEMORRHOIDS, HX OF    HYPERLIPIDEMIA 09/05/2007   HYPERTENSION 07/25/2007   OSTEOARTHRITIS 09/05/2007   SLEEP APNEA, OBSTRUCTIVE 09/05/2007   Past Surgical History:  Procedure Laterality Date   EYE SURGERY  10/22/13   left eye, cataract   Patient Active Problem List   Diagnosis Date Noted   Somnolence, daytime 02/24/2022   Falls 01/31/2022   Orthostatic hypotension 01/18/2022   Difficulty walking with leg weakness  01/10/2022   Avascular necrosis of bones of both hips with severe OA of bilateral hips  01/10/2022   Bilateral lower extremity edema 01/10/2022   Leg weakness, bilateral 08/22/2021   Spinal stenosis 03/27/2021   Lumbar radiculopathy 03/12/2021   Acute left-sided low back pain with left-sided sciatica 02/21/2021   Insomnia 10/28/2018   Bradycardia 10/09/2016   Sebaceous cyst 10/09/2016   Band keratopathy of right eye 07/02/2016   Corneal dellen of right eye 07/02/2016   Carotid bruit 04/08/2016   Acute sinusitis 11/01/2015   Elevated PSA 04/10/2015   Well adult exam 10/10/2014   Bruising 10/10/2014   PCO (posterior capsular opacification) 10/27/2013   Nuclear cataract 07/29/2012   Pseudophakia 07/29/2012    CLL (chronic lymphocytic leukemia) ([place]) 06/09/2012   Cystoid macular edema 06/03/2012   Epiretinal membrane 06/03/2012   Edema 02/07/2012   Pain of right lower leg 02/07/2012   Dyslipidemia 09/05/2007   SLEEP APNEA, OBSTRUCTIVE 09/05/2007   OSTEOARTHRITIS 09/05/2007   Essential hypertension 07/25/2007    REFERRING DIAG:  M16.12 (ICD-10-CM) - Unilateral primary osteoarthritis, left hip  M16.11 (ICD-10-CM) - Unilateral primary osteoarthritis, right hip    THERAPY DIAG:  Muscle weakness (generalized)  Other abnormalities of gait and mobility  Abnormal posture  Pain in left hip  Pain in right hip  PERTINENT HISTORY: See above  PRECAUTIONS: fall  SUBJECTIVE: "tHips don't really hurt but I think my right is worse than my left"  PAIN:  Are you having pain? No   OBJECTIVE: (objective measures completed at initial evaluation unless otherwise dated)   OBJECTIVE: from evaluation on 4/256/23  DIAGNOSTIC FINDINGS: from MD note: He has complete loss of joint space on both sides and evidence of advanced osteoarthritis and likely component of osteonecrosis of both hips.   COGNITION:  Overall cognitive status: Within functional limits for tasks assessed     SENSATION: WFL  MUSCLE LENGTH: Bil hamstring length limited in sitting with reduced knee extension.   POSTURE:  Lt LE is flexed, forward head and flexed trunk.   PALPATION: NA  LE ROM:  Not formally tested.  Pt with reduced hamstring length and IR/ER in sitting- limited by 25-50%  LE MMT:  MMT Right 04/11/2022 Left 04/11/2022  Hip flexion 4/5 4/5  Hip extension    Hip abduction 4-/5 4-/5  Hip adduction 4/5 4/5  Hip internal rotation    Hip external rotation    Knee flexion 4+/5 4+/5  Knee extension 4/5 4+/5  Ankle dorsiflexion 4+/5 4+/5  Ankle plantarflexion    Ankle inversion    Ankle eversion     (Blank rows = not tested)   FUNCTIONAL TESTS:  5 times sit to stand: 31 seconds with use of arms Timed  up and go (TUG): 1 min, 12 seconds with rolling walker   04/29/22:  5 times sit to stand: 30 seconds with arms and verbal cues for full erect posture.  Timed up and go (TUG): 1 min, 13 seconds with rolling walker  GAIT: Distance walked: 50 Assistive device utilized: Walker - 2 wheeled Level of assistance: CGA Comments: VC's for inceased step length and heel strike on the Lt LE, mod UE support on rolling walker  TODAY'S TREATMENT:  Treatment date 07/17/22: Pt seen for aquatic therapy today.  Water 3.25-4.8 ft in depth. Temp of water was 92.  Pt entered/exited the pool via lift. Min -cgasst wc<>lift   Walking forward, backward and side stepping using yellow noodle. Cga with forward, min-cg assist with back, mod side stepping multiple widths encouraging indep balance (core strengthening). Dynamic balance challenges with amb, decreasing therapist support, cuing pt on controlling gait speed, standing erect. Particular difficulty with backward walking. Standing holding to wall: df; pf; x20: marching; hip extension; squats x 10. Hip circle CW and CCW x 10 Suspended supine: supported by therapist: cycling; add/abd; flutter kicking STS from bench onto pool floor unsupported x 3;   Cues for immediate standing balance/weight shift/execution  Pt requires buoyancy for support and to offload joints with strengthening exercises. Viscosity of the water is needed for resistance of strengthening; water current perturbations provides challenge to standing balance unsupported, requiring increased core activation.        PATIENT EDUCATION:  Education details: Proper gait pattern  Person educated: Patient and Spouse Education method: Explanation, Demonstration Education comprehension: verbalized understanding and returned demonstration   HOME EXERCISE PROGRAM: Access Code: [email or web address] URL: [email or web address] Date: 04/03/2022 Prepared by: Claiborne Billings  Exercises - Seated Long Arc Quad  - 3 x  daily - 7 x weekly - 2 sets - 10 reps - 5 hold - Seated March   - 3 x daily - 7 x weekly - 3 sets - 10 reps - Seated Heel Raise  - 3 x daily - 7 x weekly - 2 sets - 10 reps - Sit to Stand with Armchair  - 2 x daily - 7 x weekly - 2 sets - 10 reps  Patient Education - Walking with a Standard Walker   ASSESSMENT:  CLINICAL IMPRESSION: Pt due for re-assessment .  He is instructed to make appointment with therapist at [facility] to complete. Pt with continued improvement with standing balance in pool as core strength improves, amb using noodle multiple widths with cga-sba.  He is able to recover from 3-4 LOB indep, requiring min assists with a few others.  He demonstrates a more erect posture submerged and is able to rise from bench in 3 ft gaining immediate standing balance with sup x3 unsupported.  Hip crepitus continues. Abd greatly limited.  He reports little to no pain. He continues  to benefit from aquatic therapy using properties of water to progress towards goals.       OBJECTIVE IMPAIRMENTS Abnormal gait, decreased activity tolerance, decreased balance, decreased endurance, decreased mobility, difficulty walking, decreased ROM, decreased strength, hypomobility, impaired flexibility, and pain.   ACTIVITY LIMITATIONS community activity, driving, and meal prep.   PERSONAL FACTORS Age and 1-2 comorbidities: bil hip OA, weakness, 2 falls  are also affecting patient's functional outcome.    REHAB POTENTIAL: Good  CLINICAL DECISION MAKING: Stable/uncomplicated  EVALUATION COMPLEXITY: Low   GOALS: Goals reviewed with patient? Yes  SHORT TERM GOALS: Target date: 05/01/22  Be independent in initial HEP Baseline: Goal status: MET (04/29/22)  2.  Perform 5x sit to stand in < or = to 21 seconds with use of arms to reduce falls risk. Baseline: 30 (04/29/22) Goal status: In progress   3.  Demonstrate Lt foot flat in stance phase of gait > or = to 50% of the time with rolling walker   Baseline:  Goal status: In progress   4.  Perform TUG in < or = to 55 seconds to reduce falls risk Baseline:  1 min, 13 seconds (04/29/22) Goal status: In progress    LONG TERM GOALS: Target date: 07/17/22  Be independent in advanced HEP Baseline:  Goal status: INITIAL  2.  Perform 5x sit to stand in < or = to 18 seconds with use of hands to reduce falls risk Baseline: 31 seconds  Goal status: in progress  3.  Perform TUG in < or = to 45 seconds to reduce falls risk Baseline:  1 min, 15 seconds  Goal status: INITIAL  4.  Improve LE strength to demonstrate symmetry with gait on level surface with use of rolling walker with min to mod UE support on rolling walker  Baseline:  Goal status: INITIAL  5.  Improve strength and endurance to stand for home tasks x 10-20 minutes without significant fatigue Baseline:  Goal status: INITIAL      PLAN: PT FREQUENCY: 2x/week  PT DURATION: 8 weeks  PLANNED INTERVENTIONS: Therapeutic exercises, Therapeutic activity, Neuromuscular re-education, Balance training, Gait training, Patient/Family education, Joint mobilization, Stair training, Aquatic Therapy, Dry Needling, Electrical stimulation, Wheelchair mobility training, Cryotherapy, Moist heat, Taping, and Manual therapy  PLAN FOR NEXT SESSION: continue to work on mobility and strength, work on symmetry and endurance, gait and sit to stand.  Aquatics: strengthening, balance, ROM hips/core  Stanton Kidney (Frankie) Nyko Gell MPT 07/17/22 3:32 PM

## 2022-07-18 ENCOUNTER — Ambulatory Visit: Payer: Medicare Other | Attending: Orthopaedic Surgery

## 2022-07-18 ENCOUNTER — Other Ambulatory Visit: Payer: Self-pay | Admitting: Internal Medicine

## 2022-07-18 DIAGNOSIS — R293 Abnormal posture: Secondary | ICD-10-CM

## 2022-07-18 DIAGNOSIS — M25552 Pain in left hip: Secondary | ICD-10-CM | POA: Diagnosis not present

## 2022-07-18 DIAGNOSIS — M25551 Pain in right hip: Secondary | ICD-10-CM | POA: Diagnosis not present

## 2022-07-18 DIAGNOSIS — M5442 Lumbago with sciatica, left side: Secondary | ICD-10-CM

## 2022-07-18 DIAGNOSIS — R2689 Other abnormalities of gait and mobility: Secondary | ICD-10-CM

## 2022-07-18 DIAGNOSIS — M6281 Muscle weakness (generalized): Secondary | ICD-10-CM | POA: Diagnosis not present

## 2022-07-18 NOTE — Therapy (Signed)
OUTPATIENT PHYSICAL THERAPY TREATMENT NOTE   Patient Name: Jerry Gomez MRN: [id number] DOB:[birth year], 88 y.o., male Today's Date: 07/18/2022  PCP: Cassandria Anger, MD REFERRING PROVIDER: Mcarthur Rossetti, MD  END OF SESSION:   PT End of Session - 07/18/22 1627     Visit Number 19    Date for PT Re-Evaluation 08/16/22    Authorization Type Medicare    Progress Note Due on Visit 54    PT Start Time 1612    PT Stop Time 3546    PT Time Calculation (min) 33 min    Activity Tolerance Patient tolerated treatment well    Behavior During Therapy WFL for tasks assessed/performed                   Past Medical History:  Diagnosis Date   Cancer ([place])    CLL   Cataract    COLONIC POLYPS 03/02/2009   EYE SURGERY, HX OF 09/05/2007   HEMORRHOIDS, HX OF    HYPERLIPIDEMIA 09/05/2007   HYPERTENSION 07/25/2007   OSTEOARTHRITIS 09/05/2007   SLEEP APNEA, OBSTRUCTIVE 09/05/2007   Past Surgical History:  Procedure Laterality Date   EYE SURGERY  10/22/13   left eye, cataract   Patient Active Problem List   Diagnosis Date Noted   Somnolence, daytime 02/24/2022   Falls 01/31/2022   Orthostatic hypotension 01/18/2022   Difficulty walking with leg weakness  01/10/2022   Avascular necrosis of bones of both hips with severe OA of bilateral hips  01/10/2022   Bilateral lower extremity edema 01/10/2022   Leg weakness, bilateral 08/22/2021   Spinal stenosis 03/27/2021   Lumbar radiculopathy 03/12/2021   Acute left-sided low back pain with left-sided sciatica 02/21/2021   Insomnia 10/28/2018   Bradycardia 10/09/2016   Sebaceous cyst 10/09/2016   Band keratopathy of right eye 07/02/2016   Corneal dellen of right eye 07/02/2016   Carotid bruit 04/08/2016   Acute sinusitis 11/01/2015   Elevated PSA 04/10/2015   Well adult exam 10/10/2014   Bruising 10/10/2014   PCO (posterior capsular opacification) 10/27/2013   Nuclear cataract 07/29/2012   Pseudophakia 07/29/2012    CLL (chronic lymphocytic leukemia) ([place]) 06/09/2012   Cystoid macular edema 06/03/2012   Epiretinal membrane 06/03/2012   Edema 02/07/2012   Pain of right lower leg 02/07/2012   Dyslipidemia 09/05/2007   SLEEP APNEA, OBSTRUCTIVE 09/05/2007   OSTEOARTHRITIS 09/05/2007   Essential hypertension 07/25/2007    REFERRING DIAG:  M16.12 (ICD-10-CM) - Unilateral primary osteoarthritis, left hip  M16.11 (ICD-10-CM) - Unilateral primary osteoarthritis, right hip    THERAPY DIAG:  Muscle weakness (generalized)  Other abnormalities of gait and mobility  Pain in left hip  Abnormal posture  Pain in right hip  Acute left-sided low back pain with left-sided sciatica  PERTINENT HISTORY: See above  PRECAUTIONS: fall  SUBJECTIVE: Patient arrives in wheelchair accompanied by wife.  He reports he feels he does very well in the water.  He is using walker any time they go out to dinner.   PAIN:  Are you having pain? No   OBJECTIVE: (objective measures completed at initial evaluation unless otherwise dated)   OBJECTIVE: from evaluation on 4/256/23  DIAGNOSTIC FINDINGS: from MD note: He has complete loss of joint space on both sides and evidence of advanced osteoarthritis and likely component of osteonecrosis of both hips.   COGNITION:  Overall cognitive status: Within functional limits for tasks assessed     SENSATION: WFL  MUSCLE LENGTH: Bil hamstring length  limited in sitting with reduced knee extension.   POSTURE:  Lt LE is flexed, forward head and flexed trunk.   PALPATION: NA  LE ROM:  Not formally tested.  Pt with reduced hamstring length and IR/ER in sitting- limited by 25-50%  LE MMT:  MMT Right 04/11/2022 Right 07/18/22 Left 04/11/2022 Left  07/18/22  Hip flexion 4/5 4+/5 4/5 4+/5  Hip extension      Hip abduction 4-/5 4/5 4-/5 4/5  Hip adduction 4/5 4+/5 4/5 4+/5  Hip internal rotation      Hip external rotation      Knee flexion 4+/5 4+/5 4+/5 4+/5  Knee  extension 4/5 4+/5 4+/5 4+/5  Ankle dorsiflexion 4+/5 4+/5 4+/5 4+/5  Ankle plantarflexion      Ankle inversion      Ankle eversion       (Blank rows = not tested)   FUNCTIONAL TESTS:  5 times sit to stand: 31 seconds with use of arms Timed up and go (TUG): 1 min, 12 seconds with rolling walker   04/29/22:  5 times sit to stand: 30 seconds with arms and verbal cues for full erect posture.  Timed up and go (TUG): 1 min 13 seconds with rolling walke 07/18/22: 5 times sit to stand: 26.65 seconds with arms and verbal cues for full erect posture.  Timed up and go (TUG): 54.12 sec with rolling walker    GAIT: Distance walked: 100 ft Assistive device utilized: Walker - 2 wheeled Level of assistance: CGA Comments: VC's for inceased step length and heel strike on the Lt LE, mod UE support on rolling walker  TODAY'S TREATMENT: Treatment date 07/18/22: Re-assessment visit   Treatment date 07/17/22: Pt seen for aquatic therapy today.  Water 3.25-4.8 ft in depth. Temp of water was 92.  Pt entered/exited the pool via lift. Min -cgasst wc<>lift   Walking forward, backward and side stepping using yellow noodle. Cga with forward, min-cg assist with back, mod side stepping multiple widths encouraging indep balance (core strengthening). Dynamic balance challenges with amb, decreasing therapist support, cuing pt on controlling gait speed, standing erect. Particular difficulty with backward walking. Standing holding to wall: df; pf; x20: marching; hip extension; squats x 10. Hip circle CW and CCW x 10 Suspended supine: supported by therapist: cycling; add/abd; flutter kicking STS from bench onto pool floor unsupported x 3;   Cues for immediate standing balance/weight shift/execution  Pt requires buoyancy for support and to offload joints with strengthening exercises. Viscosity of the water is needed for resistance of strengthening; water current perturbations provides challenge to standing balance  unsupported, requiring increased core activation.        PATIENT EDUCATION:  Education details: Proper gait pattern  Person educated: Patient and Spouse Education method: Explanation, Demonstration Education comprehension: verbalized understanding and returned demonstration   HOME EXERCISE PROGRAM: Access Code: [email or web address] URL: [email or web address] Date: 04/03/2022 Prepared by: Claiborne Billings  Exercises - Seated Long Arc Quad  - 3 x daily - 7 x weekly - 2 sets - 10 reps - 5 hold - Seated March   - 3 x daily - 7 x weekly - 3 sets - 10 reps - Seated Heel Raise  - 3 x daily - 7 x weekly - 2 sets - 10 reps - Sit to Stand with Armchair  - 2 x daily - 7 x weekly - 2 sets - 10 reps  Patient Education - Walking with a Standard Walker   ASSESSMENT:  CLINICAL IMPRESSION: Camar demonstrates improvement  on functional scores and strength.  He has slightly improved upright posture during ambulation.  He typically ambulates with rollator walker now when he and his wife go out to dinner vs. using wheelchair.  He continues to benefit from aquatic therapy using properties of water to progress towards goals.    OBJECTIVE IMPAIRMENTS Abnormal gait, decreased activity tolerance, decreased balance, decreased endurance, decreased mobility, difficulty walking, decreased ROM, decreased strength, hypomobility, impaired flexibility, and pain.   ACTIVITY LIMITATIONS community activity, driving, and meal prep.   PERSONAL FACTORS Age and 1-2 comorbidities: bil hip OA, weakness, 2 falls  are also affecting patient's functional outcome.    REHAB POTENTIAL: Good  CLINICAL DECISION MAKING: Stable/uncomplicated  EVALUATION COMPLEXITY: Low   GOALS: Goals reviewed with patient? Yes  SHORT TERM GOALS: Target date: 05/01/22  Be independent in initial HEP Baseline: Goal status: MET (04/29/22)  2.  Perform 5x sit to stand in < or = to 21 seconds with use of arms to reduce falls risk. Baseline: 30  (04/29/22) Goal status: In progress   3.  Demonstrate Lt foot flat in stance phase of gait > or = to 50% of the time with rolling walker  Baseline:  Goal status: In progress   4.  Perform TUG in < or = to 55 seconds to reduce falls risk Baseline:  1 min, 13 seconds (04/29/22) Goal status: Met (07/18/22) 54 sec    LONG TERM GOALS: Target date: 08/16/22  Be independent in advanced HEP Baseline:  Goal status: In Progress  2.  Perform 5x sit to stand in < or = to 18 seconds with use of hands to reduce falls risk Baseline: 31 seconds  Goal status: in progress  3.  Perform TUG in < or = to 45 seconds to reduce falls risk Baseline:  1 min, 15 seconds  Goal status: In progress  4.  Improve LE strength to demonstrate symmetry with gait on level surface with use of rolling walker with min to mod UE support on rolling walker  Baseline:  Goal status: In progress  5.  Improve strength and endurance to stand for home tasks x 10-20 minutes without significant fatigue Baseline:  Goal status: In progress      PLAN: PT FREQUENCY: 2x/week  PT DURATION: 8 weeks  PLANNED INTERVENTIONS: Therapeutic exercises, Therapeutic activity, Neuromuscular re-education, Balance training, Gait training, Patient/Family education, Joint mobilization, Stair training, Aquatic Therapy, Dry Needling, Electrical stimulation, Wheelchair mobility training, Cryotherapy, Moist heat, Taping, and Manual therapy  PLAN FOR NEXT SESSION: continue to work on mobility and strength, work on symmetry and endurance, gait and sit to stand.  Aquatics: strengthening, balance, ROM hips/core  Glessie Eustice B. Ryna Beckstrom, PT 07/19/22 12:31 AM  [facility] [address] [place], [place] [address] Phone # [phone] Fax [phone]

## 2022-07-24 ENCOUNTER — Other Ambulatory Visit: Payer: Self-pay

## 2022-07-24 ENCOUNTER — Inpatient Hospital Stay (HOSPITAL_BASED_OUTPATIENT_CLINIC_OR_DEPARTMENT_OTHER): Payer: Medicare Other | Admitting: Internal Medicine

## 2022-07-24 ENCOUNTER — Inpatient Hospital Stay: Payer: Medicare Other | Attending: Internal Medicine

## 2022-07-24 VITALS — BP 122/47 | HR 76 | Temp 97.2°F | Resp 17 | Wt 130.4 lb

## 2022-07-24 DIAGNOSIS — D649 Anemia, unspecified: Secondary | ICD-10-CM | POA: Insufficient documentation

## 2022-07-24 DIAGNOSIS — Z7982 Long term (current) use of aspirin: Secondary | ICD-10-CM | POA: Diagnosis not present

## 2022-07-24 DIAGNOSIS — C9110 Chronic lymphocytic leukemia of B-cell type not having achieved remission: Secondary | ICD-10-CM | POA: Insufficient documentation

## 2022-07-24 DIAGNOSIS — Z79899 Other long term (current) drug therapy: Secondary | ICD-10-CM | POA: Insufficient documentation

## 2022-07-24 LAB — CBC WITH DIFFERENTIAL (CANCER CENTER ONLY)
Abs Immature Granulocytes: 0.04 10*3/uL (ref 0.00–0.07)
Basophils Absolute: 0.1 10*3/uL (ref 0.0–0.1)
Basophils Relative: 0 %
Eosinophils Absolute: 0.1 10*3/uL (ref 0.0–0.5)
Eosinophils Relative: 0 %
HCT: 36.2 % — ABNORMAL LOW (ref 39.0–52.0)
Hemoglobin: 12.3 g/dL — ABNORMAL LOW (ref 13.0–17.0)
Immature Granulocytes: 0 %
Lymphocytes Relative: 88 %
Lymphs Abs: 31.9 10*3/uL — ABNORMAL HIGH (ref 0.7–4.0)
MCH: 32.9 pg (ref 26.0–34.0)
MCHC: 34 g/dL (ref 30.0–36.0)
MCV: 96.8 fL (ref 80.0–100.0)
Monocytes Absolute: 0.9 10*3/uL (ref 0.1–1.0)
Monocytes Relative: 3 %
Neutro Abs: 3.2 10*3/uL (ref 1.7–7.7)
Neutrophils Relative %: 9 %
Platelet Count: 129 10*3/uL — ABNORMAL LOW (ref 150–400)
RBC: 3.74 MIL/uL — ABNORMAL LOW (ref 4.22–5.81)
RDW: 13.2 % (ref 11.5–15.5)
Smear Review: NORMAL
WBC Count: 36.2 10*3/uL — ABNORMAL HIGH (ref 4.0–10.5)
nRBC: 0.1 % (ref 0.0–0.2)

## 2022-07-24 LAB — CMP (CANCER CENTER ONLY)
ALT: 16 U/L (ref 0–44)
AST: 20 U/L (ref 15–41)
Albumin: 3.9 g/dL (ref 3.5–5.0)
Alkaline Phosphatase: 80 U/L (ref 38–126)
Anion gap: 6 (ref 5–15)
BUN: 25 mg/dL — ABNORMAL HIGH (ref 8–23)
CO2: 29 mmol/L (ref 22–32)
Calcium: 9 mg/dL (ref 8.9–10.3)
Chloride: 103 mmol/L (ref 98–111)
Creatinine: 0.82 mg/dL (ref 0.61–1.24)
GFR, Estimated: >60 mL/min (ref >60)
Glucose, Bld: 118 mg/dL — ABNORMAL HIGH (ref 70–99)
Potassium: 4.1 mmol/L (ref 3.5–5.1)
Sodium: 138 mmol/L (ref 135–145)
Total Bilirubin: 0.9 mg/dL (ref 0.3–1.2)
Total Protein: 6.4 g/dL — ABNORMAL LOW (ref 6.5–8.1)

## 2022-07-24 LAB — LACTATE DEHYDROGENASE: LDH: 141 U/L (ref 98–192)

## 2022-07-24 NOTE — Progress Notes (Signed)
[facility] [phone]) [phone]   Fax:([phone]  OFFICE PROGRESS NOTE  [name], [name], MD [place] [address]  DIAGNOSIS: Stage 0 Chronic lymphocytic leukemia   PRIOR THERAPY: None   CURRENT THERAPY: Observation  INTERVAL HISTORY: Jerry Gomez 88 y.o. male returns to the clinic today for follow-up visit accompanied by his wife.  The patient is feeling fine today with no concerning complaints except for the baseline fatigue and arthralgia.  He denied having any current chest pain, shortness of breath, cough or hemoptysis.  He has no nausea, vomiting, diarrhea or constipation.  He has no headache or visual changes.  He denied having any significant weight loss or night sweats.  He is here today for evaluation with repeat blood work for evaluation of his condition.  MEDICAL HISTORY: Past Medical History:  Diagnosis Date   Cancer ([place])    CLL   Cataract    COLONIC POLYPS 03/02/2009   EYE SURGERY, HX OF 09/05/2007   HEMORRHOIDS, HX OF    HYPERLIPIDEMIA 09/05/2007   HYPERTENSION 07/25/2007   OSTEOARTHRITIS 09/05/2007   SLEEP APNEA, OBSTRUCTIVE 09/05/2007    ALLERGIES:  has No Known Allergies.  MEDICATIONS:  Current Outpatient Medications  Medication Sig Dispense Refill   Artificial Tear Ointment (ARTIFICIAL TEARS) ointment Place into both eyes nightly.     aspirin 81 MG EC tablet Take 81 mg by mouth daily. Swallow whole.     atorvastatin (LIPITOR) 20 MG tablet Take 1 tablet (20 mg total) by mouth daily. 90 tablet 1   Cholecalciferol (VITAMIN D) 2000 units tablet Take 2,000 Units by mouth daily.     Ensure (ENSURE) Take 237 mLs by mouth.     furosemide (LASIX) 20 MG tablet TAKE 1 TABLET BY MOUTH EVERY DAY 90 tablet 1   Multiple Vitamins-Minerals (MULTIVITAMIN ADULT) CHEW Chew by mouth.     No current facility-administered medications for this visit.    SURGICAL HISTORY:  Past Surgical History:  Procedure Laterality Date    EYE SURGERY  10/22/13   left eye, cataract    REVIEW OF SYSTEMS:  A comprehensive review of systems was negative except for: Constitutional: positive for fatigue Musculoskeletal: positive for arthralgias and muscle weakness   PHYSICAL EXAMINATION: General appearance: alert, cooperative, appears stated age, fatigued, and no distress Head: Normocephalic, without obvious abnormality, atraumatic Neck: no adenopathy, no JVD, supple, symmetrical, trachea midline, and thyroid not enlarged, symmetric, no tenderness/mass/nodules Lymph nodes: Cervical, supraclavicular, and axillary nodes normal. Resp: clear to auscultation bilaterally Back: symmetric, no curvature. ROM normal. No CVA tenderness. Cardio: regular rate and rhythm, S1, S2 normal, no murmur, click, rub or gallop GI: soft, non-tender; bowel sounds normal; no masses,  no organomegaly Extremities: extremities normal, atraumatic, no cyanosis or edema  ECOG PERFORMANCE STATUS: 1 - Symptomatic but completely ambulatory  Blood pressure (!) 122/47, pulse 76, temperature (!) 97.2 F (36.2 C), temperature source Oral, resp. rate 17, weight 130 lb 6 oz (59.1 kg), SpO2 99 %.  LABORATORY DATA: Lab Results  Component Value Date   WBC 36.2 (H) 07/24/2022   HGB 12.3 (L) 07/24/2022   HCT 36.2 (L) 07/24/2022   MCV 96.8 07/24/2022   PLT 129 (L) 07/24/2022      Chemistry      Component Value Date/Time   NA 142 04/30/2022 1525   NA 141 06/17/2017 1259   K 4.4 04/30/2022 1525   K 4.5 06/17/2017 1259   CL 101  04/30/2022 1525   CL 104 12/14/2012 1031   CO2 35 (H) 04/30/2022 1525   CO2 30 (H) 06/17/2017 1259   BUN 27 (H) 04/30/2022 1525   BUN 16.4 06/17/2017 1259   CREATININE 0.72 04/30/2022 1525   CREATININE 0.74 01/24/2022 1447   CREATININE 1.1 06/17/2017 1259      Component Value Date/Time   CALCIUM 9.2 04/30/2022 1525   CALCIUM 9.8 06/17/2017 1259   ALKPHOS 81 04/30/2022 1525   ALKPHOS 75 06/17/2017 1259   AST 19 04/30/2022 1525    AST 17 01/24/2022 1447   AST 29 06/17/2017 1259   ALT 14 04/30/2022 1525   ALT 16 01/24/2022 1447   ALT 27 06/17/2017 1259   BILITOT 1.0 04/30/2022 1525   BILITOT 1.0 01/24/2022 1447   BILITOT 2.19 (H) 06/17/2017 1259       RADIOGRAPHIC STUDIES: No results found.  ASSESSMENT AND PLAN:  This is a very pleasant 88 years old white male with a stage 0 chronic lymphocytic leukemia.  The patient has been on observation with no concerning complaints except for the mild fatigue. He has no bleeding, bruises or ecchymosis.  He has no palpable lymphadenopathy. Repeat CBC today showed mild elevation of the total white blood count up to 36.2 with mild anemia of 12.3 and hematocrit 36.2 and platelets count 129,000. I discussed the lab results with the patient and his wife I recommended for him to continue on observation with repeat CBC, comprehensive metabolic panel, iron study and ferritin as well as LDH in 6 months. The patient was advised to call immediately if he has any other concerning symptoms in the interval. All questions were answered. The patient knows to call the clinic with any problems, questions or concerns. We can certainly see the patient much sooner if necessary.  Disclaimer: This note was dictated with voice recognition software. Similar sounding words can inadvertently be transcribed and may not be corrected upon review.

## 2022-07-25 ENCOUNTER — Telehealth: Payer: Self-pay | Admitting: Internal Medicine

## 2022-07-25 ENCOUNTER — Ambulatory Visit (HOSPITAL_BASED_OUTPATIENT_CLINIC_OR_DEPARTMENT_OTHER): Payer: Medicare Other | Admitting: Physical Therapy

## 2022-07-25 ENCOUNTER — Encounter (HOSPITAL_BASED_OUTPATIENT_CLINIC_OR_DEPARTMENT_OTHER): Payer: Self-pay | Admitting: Physical Therapy

## 2022-07-25 DIAGNOSIS — M25552 Pain in left hip: Secondary | ICD-10-CM

## 2022-07-25 DIAGNOSIS — M25551 Pain in right hip: Secondary | ICD-10-CM

## 2022-07-25 DIAGNOSIS — R2689 Other abnormalities of gait and mobility: Secondary | ICD-10-CM

## 2022-07-25 DIAGNOSIS — M6281 Muscle weakness (generalized): Secondary | ICD-10-CM | POA: Diagnosis not present

## 2022-07-25 DIAGNOSIS — R293 Abnormal posture: Secondary | ICD-10-CM

## 2022-07-25 NOTE — Telephone Encounter (Signed)
Contacted patient to scheduled appointments. Patient is aware of appointments that are scheduled.   

## 2022-07-25 NOTE — Therapy (Signed)
OUTPATIENT PHYSICAL THERAPY TREATMENT NOTE   Patient Name: STEADMAN PROSPERI MRN: [id number] DOB:[birth year], 88 y.o., male Today's Date: 07/25/2022  PCP: Cassandria Anger, MD REFERRING PROVIDER: Mcarthur Rossetti, MD  END OF SESSION:   PT End of Session - 07/25/22 1512     Visit Number 20    Date for PT Re-Evaluation 08/16/22    Authorization Type Medicare    Progress Note Due on Visit 70    PT Start Time 1450    PT Stop Time 1530    PT Time Calculation (min) 40 min    Activity Tolerance Patient tolerated treatment well    Behavior During Therapy WFL for tasks assessed/performed                   Past Medical History:  Diagnosis Date   Cancer ([place])    CLL   Cataract    COLONIC POLYPS 03/02/2009   EYE SURGERY, HX OF 09/05/2007   HEMORRHOIDS, HX OF    HYPERLIPIDEMIA 09/05/2007   HYPERTENSION 07/25/2007   OSTEOARTHRITIS 09/05/2007   SLEEP APNEA, OBSTRUCTIVE 09/05/2007   Past Surgical History:  Procedure Laterality Date   EYE SURGERY  10/22/13   left eye, cataract   Patient Active Problem List   Diagnosis Date Noted   Somnolence, daytime 02/24/2022   Falls 01/31/2022   Orthostatic hypotension 01/18/2022   Difficulty walking with leg weakness  01/10/2022   Avascular necrosis of bones of both hips with severe OA of bilateral hips  01/10/2022   Bilateral lower extremity edema 01/10/2022   Leg weakness, bilateral 08/22/2021   Spinal stenosis 03/27/2021   Lumbar radiculopathy 03/12/2021   Acute left-sided low back pain with left-sided sciatica 02/21/2021   Insomnia 10/28/2018   Bradycardia 10/09/2016   Sebaceous cyst 10/09/2016   Band keratopathy of right eye 07/02/2016   Corneal dellen of right eye 07/02/2016   Carotid bruit 04/08/2016   Acute sinusitis 11/01/2015   Elevated PSA 04/10/2015   Well adult exam 10/10/2014   Bruising 10/10/2014   PCO (posterior capsular opacification) 10/27/2013   Nuclear cataract 07/29/2012   Pseudophakia 07/29/2012    CLL (chronic lymphocytic leukemia) ([place]) 06/09/2012   Cystoid macular edema 06/03/2012   Epiretinal membrane 06/03/2012   Edema 02/07/2012   Pain of right lower leg 02/07/2012   Dyslipidemia 09/05/2007   SLEEP APNEA, OBSTRUCTIVE 09/05/2007   OSTEOARTHRITIS 09/05/2007   Essential hypertension 07/25/2007    REFERRING DIAG:  M16.12 (ICD-10-CM) - Unilateral primary osteoarthritis, left hip  M16.11 (ICD-10-CM) - Unilateral primary osteoarthritis, right hip    THERAPY DIAG:  Muscle weakness (generalized)  Other abnormalities of gait and mobility  Pain in left hip  Abnormal posture  Pain in right hip  PERTINENT HISTORY: See above  PRECAUTIONS: fall  SUBJECTIVE: Patient arrives in wheelchair accompanied by wife. No pain. "Hip just make a lot of noise."  PAIN:  Are you having pain? No   OBJECTIVE: (objective measures completed at initial evaluation unless otherwise dated)   OBJECTIVE: from evaluation on 4/256/23  DIAGNOSTIC FINDINGS: from MD note: He has complete loss of joint space on both sides and evidence of advanced osteoarthritis and likely component of osteonecrosis of both hips.   COGNITION:  Overall cognitive status: Within functional limits for tasks assessed     SENSATION: WFL  MUSCLE LENGTH: Bil hamstring length limited in sitting with reduced knee extension.   POSTURE:  Lt LE is flexed, forward head and flexed trunk.   PALPATION: NA  LE ROM:  Not formally tested.  Pt with reduced hamstring length and IR/ER in sitting- limited by 25-50%  LE MMT:  MMT Right 04/11/2022 Right 07/18/22 Left 04/11/2022 Left  07/18/22  Hip flexion 4/5 4+/5 4/5 4+/5  Hip extension      Hip abduction 4-/5 4/5 4-/5 4/5  Hip adduction 4/5 4+/5 4/5 4+/5  Hip internal rotation      Hip external rotation      Knee flexion 4+/5 4+/5 4+/5 4+/5  Knee extension 4/5 4+/5 4+/5 4+/5  Ankle dorsiflexion 4+/5 4+/5 4+/5 4+/5  Ankle plantarflexion      Ankle inversion      Ankle  eversion       (Blank rows = not tested)   FUNCTIONAL TESTS:  5 times sit to stand: 31 seconds with use of arms Timed up and go (TUG): 1 min, 12 seconds with rolling walker   04/29/22:  5 times sit to stand: 30 seconds with arms and verbal cues for full erect posture.  Timed up and go (TUG): 1 min 13 seconds with rolling walke 07/18/22: 5 times sit to stand: 26.65 seconds with arms and verbal cues for full erect posture.  Timed up and go (TUG): 54.12 sec with rolling walker    GAIT: Distance walked: 100 ft Assistive device utilized: Walker - 2 wheeled Level of assistance: CGA Comments: VC's for inceased step length and heel strike on the Lt LE, mod UE support on rolling walker  TODAY'S TREATMENT:  Treatment date 07/26/22: Pt seen for aquatic therapy today.  Water 3.25-4.8 ft in depth. Temp of water was 92.  Pt entered/exited the pool via lift. Min -cga wc<>lift   Walking forward, backward and side stepping using white barbell. Cga with forward, min-cg assist with back, mod side stepping multiple widths encouraging indep balance (core strengthening). Dynamic balance challenges with amb, decreasing therapist support, cuing pt on controlling gait speed, standing erect. Particular difficulty with backward walking. Standing holding to wall: df; pf; x20: marching; hip extension; squats x 10. Hip circle CW and CCW x 10 Suspended supine: supported by therapist: cycling; add/abd; flutter kicking STS from lft chair Leaning against wall uw horizontal abd/add; add/abd to neutral; flex/ext to neutral x 10 using 1 foam hand buoy.  Pt requires buoyancy for support and to offload joints with strengthening exercises. Viscosity of the water is needed for resistance of strengthening; water current perturbations provides challenge to standing balance unsupported, requiring increased core activation.        PATIENT EDUCATION:  Education details: Proper gait pattern  Person educated: Patient and  Spouse Education method: Explanation, Demonstration Education comprehension: verbalized understanding and returned demonstration   HOME EXERCISE PROGRAM: Access Code: [email or web address] URL: [email or web address] Date: 04/03/2022 Prepared by: Claiborne Billings  Exercises - Seated Long Arc Quad  - 3 x daily - 7 x weekly - 2 sets - 10 reps - 5 hold - Seated March   - 3 x daily - 7 x weekly - 3 sets - 10 reps - Seated Heel Raise  - 3 x daily - 7 x weekly - 2 sets - 10 reps - Sit to Stand with Armchair  - 2 x daily - 7 x weekly - 2 sets - 10 reps  Patient Education - Walking with a Standard Walker   ASSESSMENT:  CLINICAL IMPRESSION: Pt demonstrating improving ability to complete STS weight shifting forward with increasing indep submerged.  Used barbell with amb rather than noodle. Pt with improved core control with  amb.  Cues needed for increased step length rle increasing stance time on left. He completes with less apprehension. He continues to make progress in pool which is translating well onto land decreasing dependence on wc.  Goals ongoing.  OBJECTIVE IMPAIRMENTS Abnormal gait, decreased activity tolerance, decreased balance, decreased endurance, decreased mobility, difficulty walking, decreased ROM, decreased strength, hypomobility, impaired flexibility, and pain.   ACTIVITY LIMITATIONS community activity, driving, and meal prep.   PERSONAL FACTORS Age and 1-2 comorbidities: bil hip OA, weakness, 2 falls  are also affecting patient's functional outcome.    REHAB POTENTIAL: Good  CLINICAL DECISION MAKING: Stable/uncomplicated  EVALUATION COMPLEXITY: Low   GOALS: Goals reviewed with patient? Yes  SHORT TERM GOALS: Target date: 05/01/22  Be independent in initial HEP Baseline: Goal status: MET (04/29/22)  2.  Perform 5x sit to stand in < or = to 21 seconds with use of arms to reduce falls risk. Baseline: 30 (04/29/22) Goal status: In progress   3.  Demonstrate Lt foot flat in  stance phase of gait > or = to 50% of the time with rolling walker  Baseline:  Goal status: In progress   4.  Perform TUG in < or = to 55 seconds to reduce falls risk Baseline:  1 min, 13 seconds (04/29/22) Goal status: Met (07/18/22) 54 sec    LONG TERM GOALS: Target date: 08/16/22  Be independent in advanced HEP Baseline:  Goal status: In Progress  2.  Perform 5x sit to stand in < or = to 18 seconds with use of hands to reduce falls risk Baseline: 31 seconds  Goal status: in progress  3.  Perform TUG in < or = to 45 seconds to reduce falls risk Baseline:  1 min, 15 seconds  Goal status: In progress  4.  Improve LE strength to demonstrate symmetry with gait on level surface with use of rolling walker with min to mod UE support on rolling walker  Baseline:  Goal status: In progress  5.  Improve strength and endurance to stand for home tasks x 10-20 minutes without significant fatigue Baseline:  Goal status: In progress      PLAN: PT FREQUENCY: 2x/week  PT DURATION: 8 weeks  PLANNED INTERVENTIONS: Therapeutic exercises, Therapeutic activity, Neuromuscular re-education, Balance training, Gait training, Patient/Family education, Joint mobilization, Stair training, Aquatic Therapy, Dry Needling, Electrical stimulation, Wheelchair mobility training, Cryotherapy, Moist heat, Taping, and Manual therapy  PLAN FOR NEXT SESSION: continue to work on mobility and strength, work on symmetry and endurance, gait and sit to stand.  Aquatics: strengthening, balance, ROM hips/core

## 2022-07-30 ENCOUNTER — Ambulatory Visit (HOSPITAL_BASED_OUTPATIENT_CLINIC_OR_DEPARTMENT_OTHER): Payer: Medicare Other | Admitting: Physical Therapy

## 2022-07-30 ENCOUNTER — Telehealth: Payer: Self-pay | Admitting: Internal Medicine

## 2022-07-30 ENCOUNTER — Encounter (HOSPITAL_BASED_OUTPATIENT_CLINIC_OR_DEPARTMENT_OTHER): Payer: Self-pay | Admitting: Physical Therapy

## 2022-07-30 DIAGNOSIS — M25552 Pain in left hip: Secondary | ICD-10-CM | POA: Diagnosis not present

## 2022-07-30 DIAGNOSIS — R2689 Other abnormalities of gait and mobility: Secondary | ICD-10-CM | POA: Diagnosis not present

## 2022-07-30 DIAGNOSIS — M25551 Pain in right hip: Secondary | ICD-10-CM | POA: Diagnosis not present

## 2022-07-30 DIAGNOSIS — R293 Abnormal posture: Secondary | ICD-10-CM | POA: Diagnosis not present

## 2022-07-30 DIAGNOSIS — M6281 Muscle weakness (generalized): Secondary | ICD-10-CM | POA: Diagnosis not present

## 2022-07-30 NOTE — Therapy (Signed)
OUTPATIENT PHYSICAL THERAPY TREATMENT NOTE   Patient Name: Jerry Gomez MRN: [id number] DOB:[birth year], 88 y.o., male Today's Date: 07/30/2022  PCP: Cassandria Anger, MD REFERRING PROVIDER: Mcarthur Rossetti, MD  END OF SESSION:   PT End of Session - 07/30/22 1401     Visit Number 21    Date for PT Re-Evaluation 08/16/22    Authorization Type Medicare    Progress Note Due on Visit 60    PT Start Time 7408    PT Stop Time 1445    PT Time Calculation (min) 40 min    Activity Tolerance Patient tolerated treatment well    Behavior During Therapy WFL for tasks assessed/performed                    Past Medical History:  Diagnosis Date   Cancer ([place])    CLL   Cataract    COLONIC POLYPS 03/02/2009   EYE SURGERY, HX OF 09/05/2007   HEMORRHOIDS, HX OF    HYPERLIPIDEMIA 09/05/2007   HYPERTENSION 07/25/2007   OSTEOARTHRITIS 09/05/2007   SLEEP APNEA, OBSTRUCTIVE 09/05/2007   Past Surgical History:  Procedure Laterality Date   EYE SURGERY  10/22/13   left eye, cataract   Patient Active Problem List   Diagnosis Date Noted   Somnolence, daytime 02/24/2022   Falls 01/31/2022   Orthostatic hypotension 01/18/2022   Difficulty walking with leg weakness  01/10/2022   Avascular necrosis of bones of both hips with severe OA of bilateral hips  01/10/2022   Bilateral lower extremity edema 01/10/2022   Leg weakness, bilateral 08/22/2021   Spinal stenosis 03/27/2021   Lumbar radiculopathy 03/12/2021   Acute left-sided low back pain with left-sided sciatica 02/21/2021   Insomnia 10/28/2018   Bradycardia 10/09/2016   Sebaceous cyst 10/09/2016   Band keratopathy of right eye 07/02/2016   Corneal dellen of right eye 07/02/2016   Carotid bruit 04/08/2016   Acute sinusitis 11/01/2015   Elevated PSA 04/10/2015   Well adult exam 10/10/2014   Bruising 10/10/2014   PCO (posterior capsular opacification) 10/27/2013   Nuclear cataract 07/29/2012   Pseudophakia 07/29/2012    CLL (chronic lymphocytic leukemia) ([place]) 06/09/2012   Cystoid macular edema 06/03/2012   Epiretinal membrane 06/03/2012   Edema 02/07/2012   Pain of right lower leg 02/07/2012   Dyslipidemia 09/05/2007   SLEEP APNEA, OBSTRUCTIVE 09/05/2007   OSTEOARTHRITIS 09/05/2007   Essential hypertension 07/25/2007    REFERRING DIAG:  M16.12 (ICD-10-CM) - Unilateral primary osteoarthritis, left hip  M16.11 (ICD-10-CM) - Unilateral primary osteoarthritis, right hip    THERAPY DIAG:  Muscle weakness (generalized)  Other abnormalities of gait and mobility  Pain in left hip  Abnormal posture  Pain in right hip  PERTINENT HISTORY: See above  PRECAUTIONS: fall  SUBJECTIVE: "everything is about the same"  PAIN:  Are you having pain? No   OBJECTIVE: (objective measures completed at initial evaluation unless otherwise dated)   OBJECTIVE: from evaluation on 4/256/23  DIAGNOSTIC FINDINGS: from MD note: He has complete loss of joint space on both sides and evidence of advanced osteoarthritis and likely component of osteonecrosis of both hips.   COGNITION:  Overall cognitive status: Within functional limits for tasks assessed     SENSATION: WFL  MUSCLE LENGTH: Bil hamstring length limited in sitting with reduced knee extension.   POSTURE:  Lt LE is flexed, forward head and flexed trunk.   PALPATION: NA  LE ROM:  Not formally tested.  Pt with reduced  hamstring length and IR/ER in sitting- limited by 25-50%  LE MMT:  MMT Right 04/11/2022 Right 07/18/22 Left 04/11/2022 Left  07/18/22  Hip flexion 4/5 4+/5 4/5 4+/5  Hip extension      Hip abduction 4-/5 4/5 4-/5 4/5  Hip adduction 4/5 4+/5 4/5 4+/5  Hip internal rotation      Hip external rotation      Knee flexion 4+/5 4+/5 4+/5 4+/5  Knee extension 4/5 4+/5 4+/5 4+/5  Ankle dorsiflexion 4+/5 4+/5 4+/5 4+/5  Ankle plantarflexion      Ankle inversion      Ankle eversion       (Blank rows = not tested)   FUNCTIONAL  TESTS:  5 times sit to stand: 31 seconds with use of arms Timed up and go (TUG): 1 min, 12 seconds with rolling walker   04/29/22:  5 times sit to stand: 30 seconds with arms and verbal cues for full erect posture.  Timed up and go (TUG): 1 min 13 seconds with rolling walke 07/18/22: 5 times sit to stand: 26.65 seconds with arms and verbal cues for full erect posture.  Timed up and go (TUG): 54.12 sec with rolling walker    GAIT: Distance walked: 100 ft Assistive device utilized: Walker - 2 wheeled Level of assistance: CGA Comments: VC's for inceased step length and heel strike on the Lt LE, mod UE support on rolling walker  TODAY'S TREATMENT:  Treatment date 07/26/22: Pt seen for aquatic therapy today.  Water 3.25-4.8 ft in depth. Temp of water was 92.  Pt entered/exited the pool via lift. Min -cga wc<>lift   Seated on lift:LAQ; slr/flutter kciking; add/abd; cycling   Walking forward x 4 widths; backward and side stepping x 2 widths using white barbell. Cga-sba with forward, min-cg assist with back, mod side stepping multiple widths encouraging indep balance (core strengthening). Dynamic balance challenges with amb, decreasing therapist support, cuing pt on controlling gait speed, standing erect. Particular difficulty with backward walking. Standing holding to wall: df; pf; x20: marching; hip extension; squats x 10. Hip circle CW and CCW x 10 STS from lft chair using barbell ue support. Cues for balance UE/core balance and strengthening: barbell row x 8; push down x8   Pt requires buoyancy for support and to offload joints with strengthening exercises. Viscosity of the water is needed for resistance of strengthening; water current perturbations provides challenge to standing balance unsupported, requiring increased core activation.        PATIENT EDUCATION:  Education details: Proper gait pattern  Person educated: Patient and Spouse Education method: Explanation,  Demonstration Education comprehension: verbalized understanding and returned demonstration   HOME EXERCISE PROGRAM: Access Code: [email or web address] URL: [email or web address] Date: 04/03/2022 Prepared by: Claiborne Billings  Exercises - Seated Long Arc Quad  - 3 x daily - 7 x weekly - 2 sets - 10 reps - 5 hold - Seated March   - 3 x daily - 7 x weekly - 3 sets - 10 reps - Seated Heel Raise  - 3 x daily - 7 x weekly - 2 sets - 10 reps - Sit to Stand with Armchair  - 2 x daily - 7 x weekly - 2 sets - 10 reps  Patient Education - Walking with a Standard Walker   ASSESSMENT:  CLINICAL IMPRESSION: Continued limited le movement into abd  Pt demonstrating improving ability to complete STS weight shifting forward with increasing indep submerged.  Used barbell with amb rather than noodle. Pt with improved  core control with amb.  Cues needed for increased step length rle increasing stance time on left. He completes with less apprehension. He continues to make progress in pool which is translating well onto land decreasing dependence on wc.  Goals ongoing.  OBJECTIVE IMPAIRMENTS Abnormal gait, decreased activity tolerance, decreased balance, decreased endurance, decreased mobility, difficulty walking, decreased ROM, decreased strength, hypomobility, impaired flexibility, and pain.   ACTIVITY LIMITATIONS community activity, driving, and meal prep.   PERSONAL FACTORS Age and 1-2 comorbidities: bil hip OA, weakness, 2 falls  are also affecting patient's functional outcome.    REHAB POTENTIAL: Good  CLINICAL DECISION MAKING: Stable/uncomplicated  EVALUATION COMPLEXITY: Low   GOALS: Goals reviewed with patient? Yes  SHORT TERM GOALS: Target date: 05/01/22  Be independent in initial HEP Baseline: Goal status: MET (04/29/22)  2.  Perform 5x sit to stand in < or = to 21 seconds with use of arms to reduce falls risk. Baseline: 30 (04/29/22) Goal status: In progress   3.  Demonstrate Lt foot flat  in stance phase of gait > or = to 50% of the time with rolling walker  Baseline:  Goal status: In progress   4.  Perform TUG in < or = to 55 seconds to reduce falls risk Baseline:  1 min, 13 seconds (04/29/22) Goal status: Met (07/18/22) 54 sec    LONG TERM GOALS: Target date: 08/16/22  Be independent in advanced HEP Baseline:  Goal status: In Progress  2.  Perform 5x sit to stand in < or = to 18 seconds with use of hands to reduce falls risk Baseline: 31 seconds  Goal status: in progress  3.  Perform TUG in < or = to 45 seconds to reduce falls risk Baseline:  1 min, 15 seconds  Goal status: In progress  4.  Improve LE strength to demonstrate symmetry with gait on level surface with use of rolling walker with min to mod UE support on rolling walker  Baseline:  Goal status: In progress  5.  Improve strength and endurance to stand for home tasks x 10-20 minutes without significant fatigue Baseline:  Goal status: In progress      PLAN: PT FREQUENCY: 2x/week  PT DURATION: 8 weeks  PLANNED INTERVENTIONS: Therapeutic exercises, Therapeutic activity, Neuromuscular re-education, Balance training, Gait training, Patient/Family education, Joint mobilization, Stair training, Aquatic Therapy, Dry Needling, Electrical stimulation, Wheelchair mobility training, Cryotherapy, Moist heat, Taping, and Manual therapy  PLAN FOR NEXT SESSION: continue to work on mobility and strength, work on symmetry and endurance, gait and sit to stand.  Aquatics: strengthening, balance, ROM hips/core

## 2022-07-30 NOTE — Telephone Encounter (Signed)
LVM for pt to rtn my call to schedule AWV with [facility] call back # [phone] 

## 2022-07-31 ENCOUNTER — Encounter: Payer: Self-pay | Admitting: Internal Medicine

## 2022-07-31 ENCOUNTER — Ambulatory Visit (INDEPENDENT_AMBULATORY_CARE_PROVIDER_SITE_OTHER): Payer: Medicare Other | Admitting: Internal Medicine

## 2022-07-31 DIAGNOSIS — I10 Essential (primary) hypertension: Secondary | ICD-10-CM | POA: Diagnosis not present

## 2022-07-31 DIAGNOSIS — R29898 Other symptoms and signs involving the musculoskeletal system: Secondary | ICD-10-CM

## 2022-07-31 DIAGNOSIS — L84 Corns and callosities: Secondary | ICD-10-CM | POA: Diagnosis not present

## 2022-07-31 DIAGNOSIS — W19XXXD Unspecified fall, subsequent encounter: Secondary | ICD-10-CM | POA: Diagnosis not present

## 2022-07-31 DIAGNOSIS — C9110 Chronic lymphocytic leukemia of B-cell type not having achieved remission: Secondary | ICD-10-CM

## 2022-07-31 NOTE — Assessment & Plan Note (Signed)
No falls

## 2022-07-31 NOTE — Progress Notes (Signed)
Subjective:  Patient ID: Jerry Gomez, male    DOB: 1931-01-24  Age: 88 y.o. MRN: [id number]  CC: No chief complaint on file.   HPI Jerry Gomez presents for weak legs, edema, CLL  Outpatient Medications Prior to Visit  Medication Sig Dispense Refill   Artificial Tear Ointment (ARTIFICIAL TEARS) ointment Place into both eyes nightly.     aspirin 81 MG EC tablet Take 81 mg by mouth daily. Swallow whole.     Cholecalciferol (VITAMIN D) 2000 units tablet Take 2,000 Units by mouth daily.     Ensure (ENSURE) Take 237 mLs by mouth.     furosemide (LASIX) 20 MG tablet TAKE 1 TABLET BY MOUTH EVERY DAY 90 tablet 1   Multiple Vitamins-Minerals (MULTIVITAMIN ADULT) CHEW Chew by mouth.     atorvastatin (LIPITOR) 20 MG tablet Take 1 tablet (20 mg total) by mouth daily. (Patient not taking: Reported on 07/31/2022) 90 tablet 1   No facility-administered medications prior to visit.    ROS: Review of Systems  Constitutional:  Negative for appetite change, fatigue and unexpected weight change.  HENT:  Negative for congestion, nosebleeds, sneezing, sore throat and trouble swallowing.   Eyes:  Negative for itching and visual disturbance.  Respiratory:  Negative for cough.   Cardiovascular:  Positive for leg swelling. Negative for chest pain and palpitations.  Gastrointestinal:  Negative for abdominal distention, blood in stool, diarrhea and nausea.  Genitourinary:  Negative for frequency and hematuria.  Musculoskeletal:  Positive for gait problem. Negative for arthralgias, back pain, joint swelling and neck pain.  Skin:  Negative for color change and rash.  Neurological:  Negative for dizziness, tremors, speech difficulty and weakness.  Psychiatric/Behavioral:  Negative for agitation, dysphoric mood, sleep disturbance and suicidal ideas. The patient is not nervous/anxious.     Objective:  BP (!) 110/48 (BP Location: Left Arm)   Pulse 80   Temp 98.1 F (36.7 C) (Oral)   Ht '5\' 6"'$  (1.676 m)    Wt 127 lb 6.4 oz (57.8 kg)   SpO2 96%   BMI 20.56 kg/m   BP Readings from Last 3 Encounters:  07/31/22 (!) 110/48  07/24/22 (!) 122/47  04/30/22 (!) 100/50    Wt Readings from Last 3 Encounters:  07/31/22 127 lb 6.4 oz (57.8 kg)  07/24/22 130 lb 6 oz (59.1 kg)  04/30/22 128 lb (58.1 kg)    Physical Exam Constitutional:      General: He is not in acute distress.    Appearance: He is well-developed.     Comments: NAD  Eyes:     Conjunctiva/sclera: Conjunctivae normal.     Pupils: Pupils are equal, round, and reactive to light.  Neck:     Thyroid: No thyromegaly.     Vascular: No JVD.  Cardiovascular:     Rate and Rhythm: Normal rate and regular rhythm.     Heart sounds: Normal heart sounds. No murmur heard.    No friction rub. No gallop.  Pulmonary:     Effort: Pulmonary effort is normal. No respiratory distress.     Breath sounds: Normal breath sounds. No wheezing or rales.  Chest:     Chest wall: No tenderness.  Abdominal:     General: Bowel sounds are normal. There is no distension.     Palpations: Abdomen is soft. There is no mass.     Tenderness: There is no abdominal tenderness. There is no guarding or rebound.  Musculoskeletal:  General: No tenderness. Normal range of motion.     Cervical back: Normal range of motion.  Lymphadenopathy:     Cervical: No cervical adenopathy.  Skin:    General: Skin is warm and dry.     Findings: No rash.  Neurological:     Mental Status: He is alert and oriented to person, place, and time.     Cranial Nerves: No cranial nerve deficit.     Motor: No abnormal muscle tone.     Coordination: Coordination normal.     Gait: Gait normal.     Deep Tendon Reflexes: Reflexes are normal and symmetric.  Psychiatric:        Behavior: Behavior normal.        Thought Content: Thought content normal.        Judgment: Judgment normal.   R 2nd toe corn  Lab Results  Component Value Date   WBC 36.2 (H) 07/24/2022   HGB 12.3 (L)  07/24/2022   HCT 36.2 (L) 07/24/2022   PLT 129 (L) 07/24/2022   GLUCOSE 118 (H) 07/24/2022   CHOL 119 08/14/2021   TRIG 57.0 08/14/2021   HDL 53.30 08/14/2021   LDLCALC 55 08/14/2021   ALT 16 07/24/2022   AST 20 07/24/2022   NA 138 07/24/2022   K 4.1 07/24/2022   CL 103 07/24/2022   CREATININE 0.82 07/24/2022   BUN 25 (H) 07/24/2022   CO2 29 07/24/2022   TSH 1.33 04/30/2022   PSA 8.51 (H) 08/14/2021    No results found.  Assessment & Plan:   Problem List Items Addressed This Visit     CLL (chronic lymphocytic leukemia) ([place])    Monitor CBC/WBC      Corn of foot   Relevant Orders   Ambulatory referral to Podiatry   Essential hypertension    Reduce Metoprolol dose in 1/2, furosemide prn. Low BP - reduce to 1/4 tab or stop Metoprolol      Falls    No falls      Leg weakness, bilateral    Use a Rollator walker, w/c In water therapy         No orders of the defined types were placed in this encounter.     Follow-up: Return in about 4 months (around 11/30/2022) for a follow-up visit.  Walker Kehr, MD

## 2022-07-31 NOTE — Assessment & Plan Note (Signed)
Use a Rollator walker, w/c In water therapy

## 2022-07-31 NOTE — Patient Instructions (Signed)
Blue-Emu cream -- use 2-3 times a day ? ?

## 2022-07-31 NOTE — Assessment & Plan Note (Signed)
Monitor CBC/WBC

## 2022-07-31 NOTE — Assessment & Plan Note (Signed)
Reduce Metoprolol dose in 1/2, furosemide prn. Low BP - reduce to 1/4 tab or stop Metoprolol

## 2022-08-14 ENCOUNTER — Ambulatory Visit (INDEPENDENT_AMBULATORY_CARE_PROVIDER_SITE_OTHER): Payer: Medicare Other | Admitting: Podiatry

## 2022-08-14 DIAGNOSIS — M7750 Other enthesopathy of unspecified foot: Secondary | ICD-10-CM | POA: Diagnosis not present

## 2022-08-14 DIAGNOSIS — L84 Corns and callosities: Secondary | ICD-10-CM

## 2022-08-14 NOTE — Progress Notes (Signed)
  Subjective:  Patient ID: Jerry Gomez, male    DOB: [birth year],  MRN: [id number]  Chief Complaint  Patient presents with   Callouses    RM 1 Right 2nd medial side callus x 2+ months. Pt states he was referred by PCP for debridement.     88 y.o. male presents with the above complaint. History confirmed with patient.  Patient presents with concern for a painful lesion at the inside of his right second toe.  States this has been present for more than 2 months.  Causes pain when he has pressure in between the big toe and the second toe on the right foot.  Denies seeing any drainage from the toe. Denies N/V/F/C.  Objective:  Physical Exam: warm, good capillary refill, nail exam onychomycosis of the toenails, normal DP and PT pulses, normal sensory exam, and hard callus noted at the medial aspect of the right second toe at the PIPJ area.  After debridement there is mild malodor but no purulent drainage.  There is no erythema or edema in the right second toe.  The area is mildly tender to palpation.  There is a small very superficial pressure ulceration approximately 2 mm in diameter present after debridement with a depth of only 1 mm. Left Foot: normal exam, no swelling, tenderness, instability; ligaments intact, full range of motion of all ankle/foot joints Right Foot:  Mild pain with palpation and hard callus lesion with underlying ulceration present at the medial aspect of the second toe PIPJ.  No erythema or edema of the second toe.  There is mild hammertoe contracture of the lesser digits of the right and left foot.  No images are attached to the encounter.  Assessment:   1. Callus of toe   2. Bone spur of toe      Plan:  Patient was evaluated and treated and all questions answered.  Preulcerative callus of right second toe I excisionally debrided the callus present at the medial aspect of the right second toe with a chisel blade to healthy depth to include removal of all  hyperkeratotic tissue.  Patient stated pain relief upon removal.  I recommend a gel spacer in between the first and second toe.  Additionally for the time being I would like him to proceed with daily antibiotic ointment and a thin layer over the area where the callus was present and place a Band-Aid over that area to promote healing of the very superficial and small pressure ulceration that had developed in the area under the callus.  Patient is to monitor for any redness swelling or drainage from the right second toe and call if that occurs or go to the emergency department.  I will have him follow-up in 4 weeks to ensure that that wound has healed and there are no concerns for infection.  Return in about 4 weeks (around 09/11/2022) for R 2nd toe callus.

## 2022-08-15 ENCOUNTER — Ambulatory Visit: Payer: Medicare Other | Attending: Orthopaedic Surgery

## 2022-08-15 DIAGNOSIS — R2689 Other abnormalities of gait and mobility: Secondary | ICD-10-CM

## 2022-08-15 DIAGNOSIS — M25551 Pain in right hip: Secondary | ICD-10-CM

## 2022-08-15 DIAGNOSIS — M5442 Lumbago with sciatica, left side: Secondary | ICD-10-CM

## 2022-08-15 DIAGNOSIS — R293 Abnormal posture: Secondary | ICD-10-CM | POA: Diagnosis not present

## 2022-08-15 DIAGNOSIS — M6281 Muscle weakness (generalized): Secondary | ICD-10-CM | POA: Diagnosis not present

## 2022-08-15 DIAGNOSIS — M25552 Pain in left hip: Secondary | ICD-10-CM | POA: Diagnosis not present

## 2022-08-15 NOTE — Therapy (Signed)
OUTPATIENT PHYSICAL THERAPY TREATMENT NOTE   Patient Name: Jerry Gomez MRN: [id number] DOB:08/03/1931, 88 y.o., male Today's Date: 08/15/2022  PCP: Cassandria Anger, MD REFERRING PROVIDER: Mcarthur Rossetti, MD  END OF SESSION:   PT End of Session - 08/15/22 1703     Visit Number 22    Date for PT Re-Evaluation 08/16/22    Authorization Type Medicare    Progress Note Due on Visit 30    PT Start Time 1400    PT Stop Time 8182    PT Time Calculation (min) 45 min    Activity Tolerance Patient tolerated treatment well    Behavior During Therapy WFL for tasks assessed/performed                     Past Medical History:  Diagnosis Date   Cancer ([place])    CLL   Cataract    COLONIC POLYPS 03/02/2009   EYE SURGERY, HX OF 09/05/2007   HEMORRHOIDS, HX OF    HYPERLIPIDEMIA 09/05/2007   HYPERTENSION 07/25/2007   OSTEOARTHRITIS 09/05/2007   SLEEP APNEA, OBSTRUCTIVE 09/05/2007   Past Surgical History:  Procedure Laterality Date   EYE SURGERY  10/22/13   left eye, cataract   Patient Active Problem List   Diagnosis Date Noted   Corn of foot 07/31/2022   Somnolence, daytime 02/24/2022   Falls 01/31/2022   Orthostatic hypotension 01/18/2022   Difficulty walking with leg weakness  01/10/2022   Avascular necrosis of bones of both hips with severe OA of bilateral hips  01/10/2022   Bilateral lower extremity edema 01/10/2022   Leg weakness, bilateral 08/22/2021   Spinal stenosis 03/27/2021   Lumbar radiculopathy 03/12/2021   Acute left-sided low back pain with left-sided sciatica 02/21/2021   Insomnia 10/28/2018   Bradycardia 10/09/2016   Sebaceous cyst 10/09/2016   Band keratopathy of right eye 07/02/2016   Corneal dellen of right eye 07/02/2016   Carotid bruit 04/08/2016   Acute sinusitis 11/01/2015   Elevated PSA 04/10/2015   Well adult exam 10/10/2014   Bruising 10/10/2014   PCO (posterior capsular opacification) 10/27/2013   Nuclear cataract 07/29/2012    Pseudophakia 07/29/2012   CLL (chronic lymphocytic leukemia) ([place]) 06/09/2012   Cystoid macular edema 06/03/2012   Epiretinal membrane 06/03/2012   Edema 02/07/2012   Pain of right lower leg 02/07/2012   Dyslipidemia 09/05/2007   SLEEP APNEA, OBSTRUCTIVE 09/05/2007   OSTEOARTHRITIS 09/05/2007   Essential hypertension 07/25/2007    REFERRING DIAG:  M16.12 (ICD-10-CM) - Unilateral primary osteoarthritis, left hip  M16.11 (ICD-10-CM) - Unilateral primary osteoarthritis, right hip    THERAPY DIAG:  Muscle weakness (generalized)  Other abnormalities of gait and mobility  Pain in left hip  Abnormal posture  Pain in right hip  Acute left-sided low back pain with left-sided sciatica  PERTINENT HISTORY: See above  PRECAUTIONS: fall  SUBJECTIVE: Patient states "everything is about the same" but it does feel good to get in the pool and move around  PAIN:  Are you having pain? No   OBJECTIVE: (objective measures completed at initial evaluation unless otherwise dated)   OBJECTIVE: from evaluation on 4/256/23  DIAGNOSTIC FINDINGS: from MD note: He has complete loss of joint space on both sides and evidence of advanced osteoarthritis and likely component of osteonecrosis of both hips.   COGNITION:  Overall cognitive status: Within functional limits for tasks assessed     SENSATION: WFL  MUSCLE LENGTH: Bil hamstring length limited in sitting with  reduced knee extension.   POSTURE:  Lt LE is flexed, forward head and flexed trunk.   PALPATION: NA  LE ROM:  Not formally tested.  Pt with reduced hamstring length and IR/ER in sitting- limited by 25-50%  LE MMT:  MMT Right 04/11/2022 Right 07/18/22 Right 08/15/22 Left 04/11/2022 Left  07/18/22 Left  08/15/22  Hip flexion 4/5 4+/5 4+/5 4/5 4+/5 4+/5  Hip extension        Hip abduction 4-/5 4/5 4/5 4-/5 4/5 4/5  Hip adduction 4/5 4+/5 4+/5 4/5 4+/5 4+/5  Hip internal rotation        Hip external rotation        Knee  flexion 4+/5 4+/5 4+/5 4+/5 4+/5 4+/5  Knee extension 4/5 4+/5 4+/5 4+/5 4+/5 4+/5  Ankle dorsiflexion 4+/5 4+/5 4+/5 4+/5 4+/5 4+/5  Ankle plantarflexion        Ankle inversion        Ankle eversion         (Blank rows = not tested)   FUNCTIONAL TESTS:  5 times sit to stand: 31 seconds with use of arms Timed up and go (TUG): 1 min, 12 seconds with rolling walker   04/29/22:  5 times sit to stand: 30 seconds with arms and verbal cues for full erect posture.  Timed up and go (TUG): 1 min 13 seconds with rolling walke 07/18/22: 5 times sit to stand: 26.65 seconds with arms and verbal cues for full erect posture.  Timed up and go (TUG): 54.12 sec with rolling walker  08/14/22: 5 times sit to stand: 22.23 seconds with arms and verbal cues for full erect posture.  Timed up and go (TUG): 47 sec with rolling walker  GAIT: Distance walked: 100 ft Assistive device utilized: Walker - 2 wheeled Level of assistance: CGA Comments: VC's for inceased step length and heel strike on the Lt LE, mod UE support on rolling walker  TODAY'S TREATMENT:  Treatment date 08/15/22: Re-assessment visit Lengthy discussion about progress, functional change, appropriateness of continued skilled care vs. Doing an independent program through [facility] [employer].  Discussed that the original reason for PT was to see if patient could gain enough function and strength to be a candidate for THA's but that he has not followed up with the ortho MD since he started in April.  Came up with some targets and a DC plan but wife wanted [place] to discuss with their son and he was out of town.    Treatment date 07/26/22: Pt seen for aquatic therapy today.  Water 3.25-4.8 ft in depth. Temp of water was 92.  Pt entered/exited the pool via lift. CGA wc<>lift   Seated on lift:LAQ; slr/flutter kciking; add/abd; cycling   Walking forward x 4 widths; backward and side stepping x 2 widths using white barbell. Cga-sba with forward, min-cg assist  with back, cga side stepping multiple widths encouraging indep balance (core strengthening). Standing holding to wall: df; pf; x20: marching; hip extension; squats x 10. STS from Equality chair using barbell ue support. Cues for balance UE/core balance and strengthening: barbell row x 8; push down x8 Suspended supine: cycling; add/abd  Pt requires buoyancy for support and to offload joints with strengthening exercises. Viscosity of the water is needed for resistance of strengthening; water current perturbations provides challenge to standing balance unsupported, requiring increased core activation.        PATIENT EDUCATION:  Education details: Proper gait pattern  Person educated: Patient and Spouse Education method: Explanation,  Demonstration Education comprehension: verbalized understanding and returned demonstration   HOME EXERCISE PROGRAM: Access Code: [email or web address] URL: [email or web address] Date: 04/03/2022 Prepared by: Claiborne Billings  Exercises - Seated Long Arc Quad  - 3 x daily - 7 x weekly - 2 sets - 10 reps - 5 hold - Seated March   - 3 x daily - 7 x weekly - 3 sets - 10 reps - Seated Heel Raise  - 3 x daily - 7 x weekly - 2 sets - 10 reps - Sit to Stand with Armchair  - 2 x daily - 7 x weekly - 2 sets - 10 reps  Patient Education - Walking with a Standard Walker   ASSESSMENT:  CLINICAL IMPRESSION: [place] continues to be quite restricted in his right hip and with end stage severe OA with very audible crepitus.  He is extraordinarily functional considering his condition and the contractures that have developed.  He is fairly healthy from a medical standpoint.  His candidacy for hip replacements would be best considered by ortho MD.  From a PT standpoint, he would likely do well.  He has met somewhat of a plateau functionally as he was already fairly high functioning but demonstrates improvement on functional tests.  He may benefit from continued aquatics therapy but will need  to follow up with ortho MD to discuss whether THA's would be appropriate for him considering his overall improvement.     OBJECTIVE IMPAIRMENTS Abnormal gait, decreased activity tolerance, decreased balance, decreased endurance, decreased mobility, difficulty walking, decreased ROM, decreased strength, hypomobility, impaired flexibility, and pain.   ACTIVITY LIMITATIONS community activity, driving, and meal prep.   PERSONAL FACTORS Age and 1-2 comorbidities: bil hip OA, weakness, 2 falls  are also affecting patient's functional outcome.    REHAB POTENTIAL: Good  CLINICAL DECISION MAKING: Stable/uncomplicated  EVALUATION COMPLEXITY: Low   GOALS: Goals reviewed with patient? Yes  SHORT TERM GOALS: Target date: 05/01/22  Be independent in initial HEP Baseline: Goal status: MET (04/29/22)  2.  Perform 5x sit to stand in < or = to 21 seconds with use of arms to reduce falls risk. Baseline: 30 (04/29/22) Goal status: In progress   3.  Demonstrate Lt foot flat in stance phase of gait > or = to 50% of the time with rolling walker  Baseline:  Goal status: In progress   4.  Perform TUG in < or = to 55 seconds to reduce falls risk Baseline:  1 min, 13 seconds (04/29/22) Goal status: Met (07/18/22) 54 sec    LONG TERM GOALS: Target date: 08/16/22  Be independent in advanced HEP Baseline:  Goal status: MET  2.  Perform 5x sit to stand in < or = to 18 seconds with use of hands to reduce falls risk Baseline: 31 seconds  Goal status: in progress  3.  Perform TUG in < or = to 45 seconds to reduce falls risk Baseline:  1 min, 15 seconds  Goal status: In progress  4.  Improve LE strength to demonstrate symmetry with gait on level surface with use of rolling walker with min to mod UE support on rolling walker  Baseline:  Goal status: In progress  5.  Improve strength and endurance to stand for home tasks x 10-20 minutes without significant fatigue Baseline:  Goal status: MET (can do  10 min per his report but holding onto counters)      PLAN: PT FREQUENCY: 2x/week  PT DURATION: 8 weeks  PLANNED INTERVENTIONS: Therapeutic exercises, Therapeutic  activity, Neuromuscular re-education, Balance training, Gait training, Patient/Family education, Joint mobilization, Stair training, Aquatic Therapy, Dry Needling, Electrical stimulation, Wheelchair mobility training, Cryotherapy, Moist heat, Taping, and Manual therapy  PLAN FOR NEXT SESSION: We will speak with son regarding patients situation.  At this time, we will place on hold until patient sees ortho MD.     Anderson Malta B. Trimaine Maser, PT 08/15/22 5:16 PM  [facility] [address], [place], [place] [address] Phone # [phone] Fax [phone]

## 2022-08-19 ENCOUNTER — Ambulatory Visit (INDEPENDENT_AMBULATORY_CARE_PROVIDER_SITE_OTHER): Payer: Medicare Other

## 2022-08-19 VITALS — Ht 68.0 in | Wt 130.0 lb

## 2022-08-19 DIAGNOSIS — Z0000 Encounter for general adult medical examination without abnormal findings: Secondary | ICD-10-CM

## 2022-08-19 NOTE — Patient Instructions (Signed)
Jerry Gomez , Thank you for taking time to come for your Medicare Wellness Visit. I appreciate your ongoing commitment to your health goals. Please review the following plan we discussed and let me know if I can assist you in the future.   Screening recommendations/referrals: Colonoscopy: no longer required  Recommended yearly ophthalmology/optometry visit for glaucoma screening and checkup Recommended yearly dental visit for hygiene and checkup  Vaccinations: Influenza vaccine: due in the fall  Pneumococcal vaccine: completed  Tdap vaccine: due  Shingles vaccine: will consider    Covid-19: completed   Advanced directives:Please bring a copy of your health care power of attorney and living will to the office to be added to your chart at your convenience.   Conditions/risks identified: Aim for 30 minutes of exercise or brisk walking, 6-8 glasses of water, and 5 servings of fruits and vegetables each day.   Next appointment: Follow up in one year for your annual wellness visit.   Preventive Care 4 Years and Older, Male  Preventive care refers to lifestyle choices and visits with your health care provider that can promote health and wellness. What does preventive care include? A yearly physical exam. This is also called an annual well check. Dental exams once or twice a year. Routine eye exams. Ask your health care provider how often you should have your eyes checked. Personal lifestyle choices, including: Daily care of your teeth and gums. Regular physical activity. Eating a healthy diet. Avoiding tobacco and drug use. Limiting alcohol use. Practicing safe sex. Taking low doses of aspirin every day. Taking vitamin and mineral supplements as recommended by your health care provider. What happens during an annual well check? The services and screenings done by your health care provider during your annual well check will depend on your age, overall health, lifestyle risk factors, and  family history of disease. Counseling  Your health care provider may ask you questions about your: Alcohol use. Tobacco use. Drug use. Emotional well-being. Home and relationship well-being. Sexual activity. Eating habits. History of falls. Memory and ability to understand (cognition). Work and work Statistician. Screening  You may have the following tests or measurements: Height, weight, and BMI. Blood pressure. Lipid and cholesterol levels. These may be checked every 5 years, or more frequently if you are over 36 years old. Skin check. Lung cancer screening. You may have this screening every year starting at age 43 if you have a 30-pack-year history of smoking and currently smoke or have quit within the past 15 years. Fecal occult blood test (FOBT) of the stool. You may have this test every year starting at age 24. Flexible sigmoidoscopy or colonoscopy. You may have a sigmoidoscopy every 5 years or a colonoscopy every 10 years starting at age 67. Prostate cancer screening. Recommendations will vary depending on your family history and other risks. Hepatitis C blood test. Hepatitis B blood test. Sexually transmitted disease (STD) testing. Diabetes screening. This is done by checking your blood sugar (glucose) after you have not eaten for a while (fasting). You may have this done every 1-3 years. Abdominal aortic aneurysm (AAA) screening. You may need this if you are a current or former smoker. Osteoporosis. You may be screened starting at age 73 if you are at high risk. Talk with your health care provider about your test results, treatment options, and if necessary, the need for more tests. Vaccines  Your health care provider may recommend certain vaccines, such as: Influenza vaccine. This is recommended every year. Tetanus,  diphtheria, and acellular pertussis (Tdap, Td) vaccine. You may need a Td booster every 10 years. Zoster vaccine. You may need this after age 40. Pneumococcal  13-valent conjugate (PCV13) vaccine. One dose is recommended after age 9. Pneumococcal polysaccharide (PPSV23) vaccine. One dose is recommended after age 75. Talk to your health care provider about which screenings and vaccines you need and how often you need them. This information is not intended to replace advice given to you by your health care provider. Make sure you discuss any questions you have with your health care provider. Document Released: 12/22/2015 Document Revised: 08/14/2016 Document Reviewed: 09/26/2015 Elsevier Interactive Patient Education  2017 [place] Prevention in the Home Falls can cause injuries. They can happen to people of all ages. There are many things you can do to make your home safe and to help prevent falls. What can I do on the outside of my home? Regularly fix the edges of walkways and driveways and fix any cracks. Remove anything that might make you trip as you walk through a door, such as a raised step or threshold. Trim any bushes or trees on the path to your home. Use bright outdoor lighting. Clear any walking paths of anything that might make someone trip, such as rocks or tools. Regularly check to see if handrails are loose or broken. Make sure that both sides of any steps have handrails. Any raised decks and porches should have guardrails on the edges. Have any leaves, snow, or ice cleared regularly. Use sand or salt on walking paths during winter. Clean up any spills in your garage right away. This includes oil or grease spills. What can I do in the bathroom? Use night lights. Install grab bars by the toilet and in the tub and shower. Do not use towel bars as grab bars. Use non-skid mats or decals in the tub or shower. If you need to sit down in the shower, use a plastic, non-slip stool. Keep the floor dry. Clean up any water that spills on the floor as soon as it happens. Remove soap buildup in the tub or shower regularly. Attach  bath mats securely with double-sided non-slip rug tape. Do not have throw rugs and other things on the floor that can make you trip. What can I do in the bedroom? Use night lights. Make sure that you have a light by your bed that is easy to reach. Do not use any sheets or blankets that are too big for your bed. They should not hang down onto the floor. Have a firm chair that has side arms. You can use this for support while you get dressed. Do not have throw rugs and other things on the floor that can make you trip. What can I do in the kitchen? Clean up any spills right away. Avoid walking on wet floors. Keep items that you use a lot in easy-to-reach places. If you need to reach something above you, use a strong step stool that has a grab bar. Keep electrical cords out of the way. Do not use floor polish or wax that makes floors slippery. If you must use wax, use non-skid floor wax. Do not have throw rugs and other things on the floor that can make you trip. What can I do with my stairs? Do not leave any items on the stairs. Make sure that there are handrails on both sides of the stairs and use them. Fix handrails that are broken or loose. Make  sure that handrails are as long as the stairways. Check any carpeting to make sure that it is firmly attached to the stairs. Fix any carpet that is loose or worn. Avoid having throw rugs at the top or bottom of the stairs. If you do have throw rugs, attach them to the floor with carpet tape. Make sure that you have a light switch at the top of the stairs and the bottom of the stairs. If you do not have them, ask someone to add them for you. What else can I do to help prevent falls? Wear shoes that: Do not have high heels. Have rubber bottoms. Are comfortable and fit you well. Are closed at the toe. Do not wear sandals. If you use a stepladder: Make sure that it is fully opened. Do not climb a closed stepladder. Make sure that both sides of the  stepladder are locked into place. Ask someone to hold it for you, if possible. Clearly mark and make sure that you can see: Any grab bars or handrails. First and last steps. Where the edge of each step is. Use tools that help you move around (mobility aids) if they are needed. These include: Canes. Walkers. Scooters. Crutches. Turn on the lights when you go into a dark area. Replace any light bulbs as soon as they burn out. Set up your furniture so you have a clear path. Avoid moving your furniture around. If any of your floors are uneven, fix them. If there are any pets around you, be aware of where they are. Review your medicines with your doctor. Some medicines can make you feel dizzy. This can increase your chance of falling. Ask your doctor what other things that you can do to help prevent falls. This information is not intended to replace advice given to you by your health care provider. Make sure you discuss any questions you have with your health care provider. Document Released: 09/21/2009 Document Revised: 05/02/2016 Document Reviewed: 12/30/2014 Elsevier Interactive Patient Education  2017 [employer].

## 2022-08-19 NOTE — Progress Notes (Cosign Needed Addendum)
Subjective:   Jerry Gomez is a 88 y.o. male who presents for Medicare Annual/Subsequent preventive examination.   Virtual Visit via Telephone Note  I connected with  AHMON TOSI on 08/19/22 at  2:30 PM EDT by telephone and verified that I am speaking with the correct person using two identifiers.  Location: Patient: home  Provider: [facility]  Persons participating in the virtual visit: patient/Nurse Health Advisor   I discussed the limitations, risks, security and privacy concerns of performing an evaluation and management service by telephone and the availability of in person appointments. The patient expressed understanding and agreed to proceed.  Interactive audio and video telecommunications were attempted between this nurse and patient, however failed, due to patient having technical difficulties OR patient did not have access to video capability.  We continued and completed visit with audio only.  Some vital signs may be absent or patient reported.   Daphane Shepherd, LPN  Review of Systems     Cardiac Risk Factors include: advanced age (>19mn, >>67women);dyslipidemia;male gender     Objective:    Today's Vitals   08/19/22 1451  Weight: 130 lb (59 kg)  Height: '5\' 8"'$  (1.727 m)   Body mass index is 19.77 kg/m.     08/19/2022    2:57 PM 04/03/2022    2:19 PM 01/24/2022    3:12 PM 01/10/2022   12:07 PM 01/02/2022   12:39 PM 08/14/2021    9:52 AM 04/09/2021    1:41 PM  Advanced Directives  Does Patient Have a Medical Advance Directive? Yes Yes Yes Yes Yes Yes Yes  Type of [place] will [place]Living will H[place]Living will HTwo HarborsLiving will H[place]hambraLiving will Living will;Healthcare Power of Attorney Living will;Healthcare Power of Attorney  Does patient want to make changes to medical advance directive?  No - Patient declined No - Patient  declined No - Patient declined  No - Patient declined No - Patient declined  Copy of H[place]anin Chart? No - copy requested No - copy requested No - copy requested No - copy requested  No - copy requested     Current Medications (verified) Outpatient Encounter Medications as of 08/19/2022  Medication Sig   Artificial Tear Ointment (ARTIFICIAL TEARS) ointment Place into both eyes nightly.   aspirin 81 MG EC tablet Take 81 mg by mouth daily. Swallow whole.   Cholecalciferol (VITAMIN D) 2000 units tablet Take 2,000 Units by mouth daily.   Ensure (ENSURE) Take 237 mLs by mouth.   furosemide (LASIX) 20 MG tablet TAKE 1 TABLET BY MOUTH EVERY DAY   Multiple Vitamins-Minerals (MULTIVITAMIN ADULT) CHEW Chew by mouth.   No facility-administered encounter medications on file as of 08/19/2022.    Allergies (verified) Patient has no known allergies.   History: Past Medical History:  Diagnosis Date   Cancer ([place]    CLL   Cataract    COLONIC POLYPS 03/02/2009   EYE SURGERY, HX OF 09/05/2007   HEMORRHOIDS, HX OF    HYPERLIPIDEMIA 09/05/2007   HYPERTENSION 07/25/2007   OSTEOARTHRITIS 09/05/2007   SLEEP APNEA, OBSTRUCTIVE 09/05/2007   Past Surgical History:  Procedure Laterality Date   EYE SURGERY  10/22/13   left eye, cataract   Family History  Problem Relation Age of Onset   Kidney disease Father    Cancer Father        prostate-late on-set  Cancer Brother        non-hodgkins lymphoma   Colon cancer Neg Hx    Esophageal cancer Neg Hx    Rectal cancer Neg Hx    Stomach cancer Neg Hx    Social History   Socioeconomic History   Marital status: Married    Spouse name: Not on file   Number of children: 2   Years of education: 13   Highest education level: Not on file  Occupational History   Occupation: retired  Tobacco Use   Smoking status: Former    Types: Cigarettes    Quit date: 02/07/1963    Years since quitting: 59.5   Smokeless tobacco: Never  Vaping  Use   Vaping Use: Never used  Substance and Sexual Activity   Alcohol use: Yes    Alcohol/week: 14.0 standard drinks of alcohol    Types: 14 Standard drinks or equivalent per week    Comment: wine   Drug use: No   Sexual activity: Yes    Partners: Female  Other Topics Concern   Not on file  Social History Narrative   HS[employer], [place]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 Married '54. 2 sons ' 63, '67. 2 grandchildren.   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 CPR- yes, short-term mechanical ventilation, no prolonged heroic or futile measures.    Social Determinants of Health   Financial Resource Strain: Low Risk  (08/19/2022)   Overall Financial Resource Strain (CARDIA)    Difficulty of Paying Living Expenses: Not hard at all  Food Insecurity: No Food Insecurity (08/19/2022)   Hunger Vital Sign    Worried About Running Out of Food in the Last Year: Never true    Ran Out of Food in the Last Year: Never true  Transportation Needs: No Transportation Needs (08/19/2022)   PRAPARE - Hydrologist (Medical): No    Lack of Transportation (Non-Medical): No  Physical Activity: Inactive (08/19/2022)   Exercise Vital Sign    Days of Exercise per Week: 0 days    Minutes of Exercise per Session: 0 min  Stress: No Stress Concern Present (08/19/2022)   [place]    Feeling of Stress : Not at all  Social Connections: Moderately Integrated (08/19/2022)   Social Connection and Isolation Panel [NHANES]    Frequency of Communication with Friends and Family: More than three times a week    Frequency of Social Gatherings with Friends and Family: More than three times a week    Attends Religious Services: Never    Marine scientist or Organizations: Yes    Attends Music therapist: More than 4 times per year    Marital Status: Married    Tobacco Counseling Counseling given: Not  Answered   Clinical Intake:  Pre-visit preparation completed: Yes  Pain : No/denies pain     Nutritional Risks: None Diabetes: No  How often do you need to have someone help you when you read instructions, pamphlets, or other written materials from your doctor or pharmacy?: 1 - Never  Diabetic?no   Interpreter Needed?: No  Information entered by :: Jadene Pierini , LPN   Activities of Daily Living    08/19/2022    2:59 PM 01/10/2022   12:18 PM  In your present state of health, do you have any difficulty performing the following activities:  Hearing? 0   Vision? 0   Difficulty  concentrating or making decisions? 0   Walking or climbing stairs? 0   Dressing or bathing? 0   Doing errands, shopping? 0 0  Preparing Food and eating ? N   Using the Toilet? N   In the past six months, have you accidently leaked urine? N   Do you have problems with loss of bowel control? N   Managing your Medications? N   Managing your Finances? N   Housekeeping or managing your Housekeeping? N     Patient Care Team: [name], [name], MD as PCP - General (Internal Medicine) Griselda Miner, MD (Dermatology) Curt Bears, MD (Hematology and Oncology) Marilynne Halsted, MD as Referring Physician (Ophthalmology)  Indicate any recent Medical Services you may have received from other than [facility] providers in the past year (date may be approximate).     Assessment:   This is a routine wellness examination for Jaxx.  Hearing/Vision screen Vision Screening - Comments:: Annual eye exams wears glasses   Dietary issues and exercise activities discussed: Current Exercise Habits: The patient does not participate in regular exercise at present, Exercise limited by: orthopedic condition(s)   Goals Addressed             This Visit's Progress    Maintain current health status   On track    Continue to be active,  eat healthy, enjoy family and life.       Depression Screen     08/19/2022    2:57 PM 08/14/2021    9:50 AM 02/01/2021   10:18 AM 05/10/2019   10:19 AM 04/20/2018   10:28 AM 04/10/2017   11:34 AM 12/03/2016    5:41 PM  PHQ 2/9 Scores  PHQ - 2 Score 0 0 0 0 0 0 0  PHQ- 9 Score   0  0 2     Fall Risk    08/19/2022    2:54 PM 08/14/2021    9:53 AM 02/01/2021   10:19 AM 05/10/2019   10:19 AM 04/20/2018   10:28 AM  Fall Risk   Falls in the past year? 0 0 0 0 No  Number falls in past yr: 0 0 0 0   Injury with Fall? 0 0 0    Risk for fall due to : No Fall Risks No Fall Risks No Fall Risks    Follow up Falls prevention discussed Falls evaluation completed       FALL RISK PREVENTION PERTAINING TO THE HOME:  Any stairs in or around the home? Yes  If so, are there any without handrails? No  Home free of loose throw rugs in walkways, pet beds, electrical cords, etc? Yes  Adequate lighting in your home to reduce risk of falls? Yes   ASSISTIVE DEVICES UTILIZED TO PREVENT FALLS:  Life alert? No  Use of a cane, walker or w/c? Yes  Grab bars in the bathroom? Yes  Shower chair or bench in shower? Yes  Elevated toilet seat or a handicapped toilet? Yes       04/20/2018   10:29 AM  MMSE - Mini Mental State Exam  Orientation to time 5  Orientation to Place 5  Registration 3  Attention/ Calculation 4  Recall 1  Language- name 2 objects 2  Language- repeat 1  Language- follow 3 step command 3  Language- read & follow direction 1  Write a sentence 1  Copy design 1  Total score 27        08/19/2022  2:59 PM  6CIT Screen  What Year? 0 points  What month? 0 points  What time? 0 points  Count back from 20 0 points  Months in reverse 0 points  Repeat phrase 2 points  Total Score 2 points    Immunizations Immunization History  Administered Date(s) Administered   DTP 09/08/1998   Fluad Quad(high Dose 65+) 07/28/2019, 09/12/2020, 08/22/2021   Influenza Split 08/28/2011, 09/08/2012   Influenza Whole 09/07/2008, 09/02/2009, 09/06/2010   Influenza,  High Dose Seasonal PF 08/29/2016, 09/09/2017, 08/24/2018   Influenza,inj,Quad PF,6+ Mos 08/04/2013, 09/08/2014, 09/22/2015   PFIZER(Purple Top)SARS-COV-2 Vaccination 12/28/2019, 01/18/2020, 07/28/2020   Pfizer Covid-19 Vaccine Bivalent Booster 21yr & up 09/20/2021   Pneumococcal Conjugate-13 10/10/2014   Pneumococcal Polysaccharide-23 09/08/2001, 01/15/2010, 10/28/2018   Td 01/15/2010   Zoster, Live 05/05/2011    TDAP status: Due, Education has been provided regarding the importance of this vaccine. Advised may receive this vaccine at local pharmacy or Health Dept. Aware to provide a copy of the vaccination record if obtained from local pharmacy or Health Dept. Verbalized acceptance and understanding.  Flu Vaccine status: Due, Education has been provided regarding the importance of this vaccine. Advised may receive this vaccine at local pharmacy or Health Dept. Aware to provide a copy of the vaccination record if obtained from local pharmacy or Health Dept. Verbalized acceptance and understanding.  Pneumococcal vaccine status: Up to date  Covid-19 vaccine status: Completed vaccines  Qualifies for Shingles Vaccine? Yes   Zostavax completed No   Shingrix Completed?: No.    Education has been provided regarding the importance of this vaccine. Patient has been advised to call insurance company to determine out of pocket expense if they have not yet received this vaccine. Advised may also receive vaccine at local pharmacy or Health Dept. Verbalized acceptance and understanding.  Screening Tests Health Maintenance  Topic Date Due   Zoster Vaccines- Shingrix (1 of 2) Never done   TETANUS/TDAP  01/16/2020   COVID-19 Vaccine (5 - Pfizer risk series) 11/15/2021   INFLUENZA VACCINE  07/09/2022   Pneumonia Vaccine 88 Years old  Completed   HPV VACCINES  Aged Out    Health Maintenance  Health Maintenance Due  Topic Date Due   Zoster Vaccines- Shingrix (1 of 2) Never done   TETANUS/TDAP   01/16/2020   COVID-19 Vaccine (5 - Pfizer risk series) 11/15/2021   INFLUENZA VACCINE  07/09/2022    Colorectal cancer screening: No longer required.   Lung Cancer Screening: (Low Dose CT Chest recommended if Age 86-80years, 30 pack-year currently smoking OR have quit w/in 15years.) does not qualify.   Lung Cancer Screening Referral: n/a  Additional Screening:  Hepatitis C Screening: does not qualify;  Vision Screening: Recommended annual ophthalmology exams for early detection of glaucoma and other disorders of the eye. Is the patient up to date with their annual eye exam?  Yes  Who is the provider or what is the name of the office in which the patient attends annual eye exams? Dr.Tanner  If pt is not established with a provider, would they like to be referred to a provider to establish care? No .   Dental Screening: Recommended annual dental exams for proper oral hygiene  Community Resource Referral / Chronic Care Management: CRR required this visit?  No   CCM required this visit?  No      Plan:     I have personally reviewed and noted the following in the patient's chart:   Medical  and social history Use of alcohol, tobacco or illicit drugs  Current medications and supplements including opioid prescriptions. Patient is not currently taking opioid prescriptions. Functional ability and status Nutritional status Physical activity Advanced directives List of other physicians Hospitalizations, surgeries, and ER visits in previous 12 months Vitals Screenings to include cognitive, depression, and falls Referrals and appointments  In addition, I have reviewed and discussed with patient certain preventive protocols, quality metrics, and best practice recommendations. A written personalized care plan for preventive services as well as general preventive health recommendations were provided to patient.     Daphane Shepherd, LPN   [id number]   Nurse Notes: none    Medical  screening examination/treatment/procedure(s) were performed by non-physician practitioner and as supervising physician I was immediately available for consultation/collaboration.  I agree with above. [name], MD

## 2022-08-28 ENCOUNTER — Encounter: Payer: Self-pay | Admitting: Internal Medicine

## 2022-08-29 ENCOUNTER — Ambulatory Visit (INDEPENDENT_AMBULATORY_CARE_PROVIDER_SITE_OTHER): Payer: Medicare Other

## 2022-08-29 DIAGNOSIS — Z23 Encounter for immunization: Secondary | ICD-10-CM | POA: Diagnosis not present

## 2022-08-29 NOTE — Progress Notes (Signed)
Pt received flu vaccine w/o any complications.  

## 2022-08-30 ENCOUNTER — Other Ambulatory Visit: Payer: Self-pay | Admitting: Internal Medicine

## 2022-08-30 DIAGNOSIS — R29898 Other symptoms and signs involving the musculoskeletal system: Secondary | ICD-10-CM

## 2022-09-04 ENCOUNTER — Ambulatory Visit: Payer: Medicare Other | Attending: Internal Medicine

## 2022-09-04 DIAGNOSIS — R29898 Other symptoms and signs involving the musculoskeletal system: Secondary | ICD-10-CM | POA: Insufficient documentation

## 2022-09-04 DIAGNOSIS — M5442 Lumbago with sciatica, left side: Secondary | ICD-10-CM

## 2022-09-04 DIAGNOSIS — R2689 Other abnormalities of gait and mobility: Secondary | ICD-10-CM

## 2022-09-04 DIAGNOSIS — M25551 Pain in right hip: Secondary | ICD-10-CM | POA: Diagnosis not present

## 2022-09-04 DIAGNOSIS — M25552 Pain in left hip: Secondary | ICD-10-CM

## 2022-09-04 DIAGNOSIS — M6281 Muscle weakness (generalized): Secondary | ICD-10-CM | POA: Diagnosis not present

## 2022-09-04 DIAGNOSIS — R293 Abnormal posture: Secondary | ICD-10-CM

## 2022-09-04 NOTE — Therapy (Signed)
OUTPATIENT PHYSICAL THERAPY TREATMENT NOTE   Patient Name: Jerry Gomez MRN: [id number] DOB:17-Mar-1931, 88 y.o., male Today's Date: 09/04/2022  PCP: Cassandria Anger, MD REFERRING PROVIDER: Mcarthur Rossetti, MD  END OF SESSION:   PT End of Session - 09/04/22 1538     Visit Number 23    Date for PT Re-Evaluation 10/30/22    Authorization Type Medicare    Progress Note Due on Visit 30    PT Start Time 1530    PT Stop Time 1554    PT Time Calculation (min) 24 min    Activity Tolerance Other (comment)   limited by severe bilateral hip OA and contractures  but patient denies pain   Behavior During Therapy [facility] for tasks assessed/performed                     Past Medical History:  Diagnosis Date   Cancer ([place])    CLL   Cataract    COLONIC POLYPS 03/02/2009   EYE SURGERY, HX OF 09/05/2007   HEMORRHOIDS, HX OF    HYPERLIPIDEMIA 09/05/2007   HYPERTENSION 07/25/2007   OSTEOARTHRITIS 09/05/2007   SLEEP APNEA, OBSTRUCTIVE 09/05/2007   Past Surgical History:  Procedure Laterality Date   EYE SURGERY  10/22/13   left eye, cataract   Patient Active Problem List   Diagnosis Date Noted   Corn of foot 07/31/2022   Somnolence, daytime 02/24/2022   Falls 01/31/2022   Orthostatic hypotension 01/18/2022   Difficulty walking with leg weakness  01/10/2022   Avascular necrosis of bones of both hips with severe OA of bilateral hips  01/10/2022   Bilateral lower extremity edema 01/10/2022   Leg weakness, bilateral 08/22/2021   Spinal stenosis 03/27/2021   Lumbar radiculopathy 03/12/2021   Acute left-sided low back pain with left-sided sciatica 02/21/2021   Insomnia 10/28/2018   Bradycardia 10/09/2016   Sebaceous cyst 10/09/2016   Band keratopathy of right eye 07/02/2016   Corneal dellen of right eye 07/02/2016   Carotid bruit 04/08/2016   Acute sinusitis 11/01/2015   Elevated PSA 04/10/2015   Well adult exam 10/10/2014   Bruising 10/10/2014   PCO (posterior  capsular opacification) 10/27/2013   Nuclear cataract 07/29/2012   Pseudophakia 07/29/2012   CLL (chronic lymphocytic leukemia) ([facility]) 06/09/2012   Cystoid macular edema 06/03/2012   Epiretinal membrane 06/03/2012   Edema 02/07/2012   Pain of right lower leg 02/07/2012   Dyslipidemia 09/05/2007   SLEEP APNEA, OBSTRUCTIVE 09/05/2007   OSTEOARTHRITIS 09/05/2007   Essential hypertension 07/25/2007    REFERRING DIAG:  M16.12 (ICD-10-CM) - Unilateral primary osteoarthritis, left hip  M16.11 (ICD-10-CM) - Unilateral primary osteoarthritis, right hip    THERAPY DIAG:  Muscle weakness (generalized)  Other abnormalities of gait and mobility  Pain in left hip  Abnormal posture  Pain in right hip  Acute left-sided low back pain with left-sided sciatica  PERTINENT HISTORY: See above  PRECAUTIONS: fall  SUBJECTIVE: Patient states he has continued to use walker at all times in the house but when they go out to dinner, he uses wheelchair or if they go shopping, he must have motorized cart.  PAIN:  Are you having pain? No   OBJECTIVE: (objective measures completed at initial evaluation unless otherwise dated)   OBJECTIVE: from evaluation on 4/256/23  DIAGNOSTIC FINDINGS: from MD note: He has complete loss of joint space on both sides and evidence of advanced osteoarthritis and likely component of osteonecrosis of both hips.   COGNITION:  Overall cognitive status: Within functional limits for tasks assessed     SENSATION: WFL  MUSCLE LENGTH: Bil hamstring length limited in sitting with reduced knee extension.   POSTURE:  Lt LE is flexed, forward head and flexed trunk.   PALPATION: NA  LE ROM:  Not formally tested.  Pt with reduced hamstring length and IR/ER in sitting- limited by 25-50%  LE MMT:  MMT Right 04/11/2022 Right 07/18/22 Right 08/15/22 Left 04/11/2022 Left  07/18/22 Left  08/15/22 Left and Right 09/04/22  Hip flexion 4/5 4+/5 4+/5 4/5 4+/5 4+/5 All  unchanged  Hip extension         Hip abduction 4-/5 4/5 4/5 4-/5 4/5 4/5   Hip adduction 4/5 4+/5 4+/5 4/5 4+/5 4+/5   Hip internal rotation         Hip external rotation         Knee flexion 4+/5 4+/5 4+/5 4+/5 4+/5 4+/5   Knee extension 4/5 4+/5 4+/5 4+/5 4+/5 4+/5   Ankle dorsiflexion 4+/5 4+/5 4+/5 4+/5 4+/5 4+/5   Ankle plantarflexion         Ankle inversion         Ankle eversion          (Blank rows = not tested)   FUNCTIONAL TESTS:  5 times sit to stand: 31 seconds with use of arms Timed up and go (TUG): 1 min, 12 seconds with rolling walker   04/29/22:  5 times sit to stand: 30 seconds with arms and verbal cues for full erect posture.  Timed up and go (TUG): 1 min 13 seconds with rolling walke 07/18/22: 5 times sit to stand: 26.65 seconds with arms and verbal cues for full erect posture.  Timed up and go (TUG): 54.12 sec with rolling walker  08/14/22: 5 times sit to stand: 22.23 seconds with arms and verbal cues for full erect posture.  Timed up and go (TUG): 47 sec with rolling walker  09/04/22: 5 times sit to stand: 21.65 seconds with arms and verbal cues for full erect posture.  Timed up and go (TUG): 1 min 7 sec with rolling walker GAIT: Distance walked: 100 ft Assistive device utilized: Environmental consultant - 2 wheeled Level of assistance: CGA Comments: VC's for inceased step length and heel strike on the Lt LE, mod UE support on rolling walker  TODAY'S TREATMENT:  Treatment date 08/15/22: ERO due to new referral from new MD Reviewed plan of care and suggestions for mobility outside the home which included use of wheelchair when leaving home  Treatment date 08/15/22: Re-assessment visit Lengthy discussion about progress, functional change, appropriateness of continued skilled care vs. Doing an independent program through [employer].  Discussed that the original reason for PT was to see if patient could gain enough function and strength to be a candidate for THA's but that he  has not followed up with the ortho MD since he started in April.  Came up with some targets and a DC plan but wife wanted [place] to discuss with their son and he was out of town.    Treatment date 07/26/22: Pt seen for aquatic therapy today.  Water 3.25-4.8 ft in depth. Temp of water was 92.  Pt entered/exited the pool via lift. CGA wc<>lift   Seated on lift:LAQ; slr/flutter kciking; add/abd; cycling   Walking forward x 4 widths; backward and side stepping x 2 widths using white barbell. Cga-sba with forward, min-cg assist with back, cga side stepping multiple widths encouraging  indep balance (core strengthening). Standing holding to wall: df; pf; x20: marching; hip extension; squats x 10. STS from [place] chair using barbell ue support. Cues for balance UE/core balance and strengthening: barbell row x 8; push down x8 Suspended supine: cycling; add/abd  Pt requires buoyancy for support and to offload joints with strengthening exercises. Viscosity of the water is needed for resistance of strengthening; water current perturbations provides challenge to standing balance unsupported, requiring increased core activation.        PATIENT EDUCATION:  Education details: Proper gait pattern  Person educated: Patient and Spouse Education method: Explanation, Demonstration Education comprehension: verbalized understanding and returned demonstration   HOME EXERCISE PROGRAM: Access Code: [email or web address] URL: [email or web address] Date: 04/03/2022 Prepared by: Claiborne Billings  Exercises - Seated Long Arc Quad  - 3 x daily - 7 x weekly - 2 sets - 10 reps - 5 hold - Seated March   - 3 x daily - 7 x weekly - 3 sets - 10 reps - Seated Heel Raise  - 3 x daily - 7 x weekly - 2 sets - 10 reps - Sit to Stand with Armchair  - 2 x daily - 7 x weekly - 2 sets - 10 reps  Patient Education - Walking with a Standard Walker   ASSESSMENT:  CLINICAL IMPRESSION: [place] continues to be quite restricted in his right hip  and with end stage severe OA with very audible crepitus.  He is extraordinarily functional considering his condition and the contractures that have developed.  He is fairly healthy from a medical standpoint.  His candidacy for hip replacements would be best considered by ortho MD and patient has scheduled a second opinion with Dr. Gaynelle Arabian just to be sure they have covered all their options.  His new referral is from his primary MD.  It was suggested that he continue PT, specifically aquatics, until seeing Dr. Wynelle Link to avoid loss of function and possible non candidacy for hip replacements.   From a PT standpoint, he would likely do well.  He has met somewhat of a plateau functionally as he was already fairly high functioning but demonstrates improvement on functional tests.  He would benefit from continued aquatics therapy until appt with ortho MD.     OBJECTIVE IMPAIRMENTS Abnormal gait, decreased activity tolerance, decreased balance, decreased endurance, decreased mobility, difficulty walking, decreased ROM, decreased strength, hypomobility, impaired flexibility, and pain.   ACTIVITY LIMITATIONS community activity, driving, and meal prep.   PERSONAL FACTORS Age and 1-2 comorbidities: bil hip OA, weakness, 2 falls  are also affecting patient's functional outcome.    REHAB POTENTIAL: Good  CLINICAL DECISION MAKING: Stable/uncomplicated  EVALUATION COMPLEXITY: Low   GOALS: Goals reviewed with patient? Yes  SHORT TERM GOALS: Target date: 05/01/22  Be independent in initial HEP Baseline: Goal status: MET (04/29/22)  2.  Perform 5x sit to stand in < or = to 21 seconds with use of arms to reduce falls risk. Baseline: 30 (04/29/22) Goal status: MET   3.  Demonstrate Lt foot flat in stance phase of gait > or = to 50% of the time with rolling walker  Baseline:  Goal status: In progress   4.  Perform TUG in < or = to 55 seconds to reduce falls risk Baseline:  1 min, 13 seconds  (04/29/22) Goal status: Met (07/18/22) 54 sec    LONG TERM GOALS: Target date: 11/22//23  Be independent in advanced HEP Baseline:  Goal status: MET  2.  Perform 5x  sit to stand in < or = to 18 seconds with use of hands to reduce falls risk Baseline: 31 seconds  Goal status: in progress  3.  Perform TUG in < or = to 45 seconds to reduce falls risk Baseline:  1 min, 15 seconds  Goal status: In progress  4.  Improve LE strength to demonstrate symmetry with gait on level surface with use of rolling walker with min to mod UE support on rolling walker  Baseline:  Goal status: In progress  5.  Improve strength and endurance to stand for home tasks x 10-20 minutes without significant fatigue Baseline:  Goal status: MET (can do 10 min per his report but holding onto counters)      PLAN: PT FREQUENCY: 2x/week (09/04/22 will continue in pool 1 time per week until he sees Dr. Wynelle Link) If candidate for hip replacements, and MD suggests continued PT, we will proceed with further therapy at that time.    PT DURATION: 8 weeks (09/04/22 until follow up with MD on 09/26/22)  PLANNED INTERVENTIONS: Therapeutic exercises, Therapeutic activity, Neuromuscular re-education, Balance training, Gait training, Patient/Family education, Joint mobilization, Stair training, Aquatic Therapy, Dry Needling, Electrical stimulation, Wheelchair mobility training, Cryotherapy, Moist heat, Taping, and Manual therapy  PLAN FOR NEXT SESSION: Proceed 1 time per week in pool until patient sees Dr. Wynelle Link, the proceed based on ortho MD suggestion.      Anderson Malta B. Giani Betzold, PT 09/04/22 4:11 PM  [facility] [address] [place], [place] [address] Phone # [phone] Fax [phone]

## 2022-09-12 ENCOUNTER — Ambulatory Visit (INDEPENDENT_AMBULATORY_CARE_PROVIDER_SITE_OTHER): Payer: Medicare Other | Admitting: Podiatry

## 2022-09-12 ENCOUNTER — Ambulatory Visit: Payer: Medicare Other | Admitting: Podiatry

## 2022-09-12 DIAGNOSIS — M7750 Other enthesopathy of unspecified foot: Secondary | ICD-10-CM

## 2022-09-12 DIAGNOSIS — L84 Corns and callosities: Secondary | ICD-10-CM | POA: Diagnosis not present

## 2022-09-12 DIAGNOSIS — M79675 Pain in left toe(s): Secondary | ICD-10-CM

## 2022-09-12 DIAGNOSIS — B351 Tinea unguium: Secondary | ICD-10-CM | POA: Diagnosis not present

## 2022-09-12 DIAGNOSIS — M79674 Pain in right toe(s): Secondary | ICD-10-CM | POA: Diagnosis not present

## 2022-09-12 NOTE — Progress Notes (Signed)
  Subjective:  Patient ID: Jerry Gomez, male    DOB: 10-10-1931,  MRN: [id number]  Chief Complaint  Patient presents with   Callouses    Room 16 F/u for callus on 2nd toe and 1st toe on right foot     88 y.o. male presents with the above complaint. History confirmed with patient.  Patient presents for follow-up of lesion at the medial aspect of the second toe proximal interphalangeal joint on the right foot.  He was last seen a month ago and this lesion was debrided down and there was noted to be a small opening present underneath it.  Since then he has been putting antibiotic ointment and a Band-Aid on it and wearing a toe spacer as previously instructed.  His wife has been helping with the care of the toe and feels that it is improving.  Patient also reports painful nails x5 present on both feet due to thickening and elongation as well as some discoloration and concern for fungal infection.  Objective:  Physical Exam: warm, good capillary refill, nail exam onychomycosis of the toenails with pain on palpation dystrophic growth and elongation, normal DP and PT pulses, normal sensory exam, and hard callus noted at the medial aspect of the right second toe at the PIPJ area.  After debridement of hyperkeratotic tissue overlying there is no open wound present has healed then from prior visit  left Foot: normal exam, no swelling, tenderness, instability; ligaments intact, full range of motion of all ankle/foot joints Right Foot: Mild pain with palpation and hard callus lesion, hammertoe deformity noted of the right second toe with osseous prominence at the medial aspect of the proximal interphalangeal joint of the second toe.     Assessment:   1. Callus of toe   2. Pain due to onychomycosis of toenails of both feet   3. Bone spur of toe     Plan:  Patient was evaluated and treated and all questions answered.  Preulcerative callus of right second toe All symptomatic hyperkeratoses x1 lesion  at the medial aspect of the right second toe PIPJ were safely debrided with a sterile #15 blade to patient's level of comfort without incident. We discussed preventative and palliative care of these lesions including supportive and accommodative shoegear, padding, prefabricated and custom molded accommodative orthoses, use of a pumice stone and lotions/creams daily. -Recommend use of gel spacers between the first and second toe on the right foot.  No open wound is present at this time and therefore no ointment or dressing needs to be applied   Onychomycosis with pain  -Nails palliatively debrided as below. -Educated on self-care  Procedure: Nail Debridement Rationale: Pain Type of Debridement: manual, sharp debridement. Instrumentation: Nail nipper, rotary burr. Number of Nails: 10   Return in about 9 weeks (around 11/14/2022) for RFC.

## 2022-09-24 DIAGNOSIS — M25551 Pain in right hip: Secondary | ICD-10-CM | POA: Insufficient documentation

## 2022-09-24 DIAGNOSIS — M25552 Pain in left hip: Secondary | ICD-10-CM | POA: Insufficient documentation

## 2022-09-25 ENCOUNTER — Ambulatory Visit (HOSPITAL_BASED_OUTPATIENT_CLINIC_OR_DEPARTMENT_OTHER): Payer: Medicare Other | Admitting: Physical Therapy

## 2022-09-26 DIAGNOSIS — M160 Bilateral primary osteoarthritis of hip: Secondary | ICD-10-CM | POA: Diagnosis not present

## 2022-09-27 ENCOUNTER — Telehealth: Payer: Self-pay | Admitting: Internal Medicine

## 2022-09-27 NOTE — Telephone Encounter (Signed)
Rec'd form called pt spokje w/ wife. He has appt sch 10/23/22 will hold on to form til appt because he need to be seen. Surgery not until Dec../lmb

## 2022-09-27 NOTE — Telephone Encounter (Signed)
For our records:  We have received Pre-op pw from [facility] and it has been placed in Dr. Judeen Hammans box

## 2022-09-29 DIAGNOSIS — M1611 Unilateral primary osteoarthritis, right hip: Secondary | ICD-10-CM | POA: Insufficient documentation

## 2022-09-29 DIAGNOSIS — M1612 Unilateral primary osteoarthritis, left hip: Secondary | ICD-10-CM | POA: Insufficient documentation

## 2022-10-01 NOTE — Therapy (Signed)
OUTPATIENT PHYSICAL THERAPY TREATMENT NOTE   Patient Name: SUVAN STCYR MRN: [id number] DOB:1931/11/22, 88 y.o., male Today's Date: 10/02/2022  PCP: Cassandria Anger, MD REFERRING PROVIDER: Mcarthur Rossetti, MD  END OF SESSION:   PT End of Session - 10/02/22 1706     Visit Number 24    Date for PT Re-Evaluation 10/30/22    Authorization Type Medicare    PT Start Time 9528    PT Stop Time 1500    PT Time Calculation (min) 40 min    Activity Tolerance Other (comment)    Behavior During Therapy WFL for tasks assessed/performed                      Past Medical History:  Diagnosis Date   Cancer ([place])    CLL   Cataract    COLONIC POLYPS 03/02/2009   EYE SURGERY, HX OF 09/05/2007   HEMORRHOIDS, HX OF    HYPERLIPIDEMIA 09/05/2007   HYPERTENSION 07/25/2007   OSTEOARTHRITIS 09/05/2007   SLEEP APNEA, OBSTRUCTIVE 09/05/2007   Past Surgical History:  Procedure Laterality Date   EYE SURGERY  10/22/13   left eye, cataract   Patient Active Problem List   Diagnosis Date Noted   Corn of foot 07/31/2022   Somnolence, daytime 02/24/2022   Falls 01/31/2022   Orthostatic hypotension 01/18/2022   Difficulty walking with leg weakness  01/10/2022   Avascular necrosis of bones of both hips with severe OA of bilateral hips  01/10/2022   Bilateral lower extremity edema 01/10/2022   Leg weakness, bilateral 08/22/2021   Spinal stenosis 03/27/2021   Lumbar radiculopathy 03/12/2021   Acute left-sided low back pain with left-sided sciatica 02/21/2021   Insomnia 10/28/2018   Bradycardia 10/09/2016   Sebaceous cyst 10/09/2016   Band keratopathy of right eye 07/02/2016   Corneal dellen of right eye 07/02/2016   Carotid bruit 04/08/2016   Acute sinusitis 11/01/2015   Elevated PSA 04/10/2015   Well adult exam 10/10/2014   Bruising 10/10/2014   PCO (posterior capsular opacification) 10/27/2013   Nuclear cataract 07/29/2012   Pseudophakia 07/29/2012   CLL (chronic  lymphocytic leukemia) ([place]) 06/09/2012   Cystoid macular edema 06/03/2012   Epiretinal membrane 06/03/2012   Edema 02/07/2012   Pain of right lower leg 02/07/2012   Dyslipidemia 09/05/2007   SLEEP APNEA, OBSTRUCTIVE 09/05/2007   OSTEOARTHRITIS 09/05/2007   Essential hypertension 07/25/2007    REFERRING DIAG:  M16.12 (ICD-10-CM) - Unilateral primary osteoarthritis, left hip  M16.11 (ICD-10-CM) - Unilateral primary osteoarthritis, right hip    THERAPY DIAG:  Muscle weakness (generalized)  Other abnormalities of gait and mobility  Pain in left hip  Abnormal posture  PERTINENT HISTORY: See above  PRECAUTIONS: fall  SUBJECTIVE: Patient states he is scheduled for hip surgery in Dec and is looking forward to it.[name]  PAIN:  Are you having pain? No   OBJECTIVE: (objective measures completed at initial evaluation unless otherwise dated)   OBJECTIVE: from evaluation on 4/256/23  DIAGNOSTIC FINDINGS: from MD note: He has complete loss of joint space on both sides and evidence of advanced osteoarthritis and likely component of osteonecrosis of both hips.   COGNITION:  Overall cognitive status: Within functional limits for tasks assessed     SENSATION: WFL  MUSCLE LENGTH: Bil hamstring length limited in sitting with reduced knee extension.   POSTURE:  Lt LE is flexed, forward head and flexed trunk.   PALPATION: NA  LE ROM:  Not formally tested.  Pt with reduced hamstring length and IR/ER in sitting- limited by 25-50%  LE MMT:  MMT Right 04/11/2022 Right 07/18/22 Right 08/15/22 Left 04/11/2022 Left  07/18/22 Left  08/15/22 Left and Right 09/04/22  Hip flexion 4/5 4+/5 4+/5 4/5 4+/5 4+/5 All unchanged  Hip extension         Hip abduction 4-/5 4/5 4/5 4-/5 4/5 4/5   Hip adduction 4/5 4+/5 4+/5 4/5 4+/5 4+/5   Hip internal rotation         Hip external rotation         Knee flexion 4+/5 4+/5 4+/5 4+/5 4+/5 4+/5   Knee extension 4/5 4+/5 4+/5 4+/5 4+/5 4+/5   Ankle  dorsiflexion 4+/5 4+/5 4+/5 4+/5 4+/5 4+/5   Ankle plantarflexion         Ankle inversion         Ankle eversion          (Blank rows = not tested)   FUNCTIONAL TESTS:  5 times sit to stand: 31 seconds with use of arms Timed up and go (TUG): 1 min, 12 seconds with rolling walker   04/29/22:  5 times sit to stand: 30 seconds with arms and verbal cues for full erect posture.  Timed up and go (TUG): 1 min 13 seconds with rolling walke 07/18/22: 5 times sit to stand: 26.65 seconds with arms and verbal cues for full erect posture.  Timed up and go (TUG): 54.12 sec with rolling walker  08/14/22: 5 times sit to stand: 22.23 seconds with arms and verbal cues for full erect posture.  Timed up and go (TUG): 47 sec with rolling walker  09/04/22: 5 times sit to stand: 21.65 seconds with arms and verbal cues for full erect posture.  Timed up and go (TUG): 1 min 7 sec with rolling walker GAIT: Distance walked: 100 ft Assistive device utilized: Walker - 2 wheeled Level of assistance: CGA Comments: VC's for inceased step length and heel strike on the Lt LE, mod UE support on rolling walker  TODAY'S TREATMENT: Treatment date 10/02/22: Pt seen for aquatic therapy today.  Water 3.25-4.8 ft in depth. Temp of water was 92.  Pt entered/exited the pool via lift. CGA wc<>lift   Seated on lift:LAQ; slr/flutter kicking; add/abd; cycling   Walking forward x 4 widths; backward and side stepping x 2 widths using white barbell encouraging indep balance (core strengthening). Standing holding to wall: df; pf; x20: marching; hip extension; squats x 10. Hip circles cw & ccw x 10 STS from bench using barbell ue support. Cues for balance and min  UE/core balance and strengthening: barbell row x 8; push down x8 Seated on bench: LAQ; cycling  Pt requires buoyancy for support and to offload joints with strengthening exercises. Viscosity of the water is needed for resistance of strengthening; water current  perturbations provides challenge to standing balance unsupported, requiring increased core activation.   Treatment date 08/15/22: ERO due to new referral from new MD Reviewed plan of care and suggestions for mobility outside the home which included use of wheelchair when leaving home  Treatment date 08/15/22: Re-assessment visit Lengthy discussion about progress, functional change, appropriateness of continued skilled care vs. Doing an independent program through [employer].  Discussed that the original reason for PT was to see if patient could gain enough function and strength to be a candidate for THA's but that he has not followed up with the ortho MD since he started in April.  Came up  with some targets and a DC plan but wife wanted [place] to discuss with their son and he was out of town.    Treatment date 07/26/22: Pt seen for aquatic therapy today.  Water 3.25-4.8 ft in depth. Temp of water was 92.  Pt entered/exited the pool via lift. CGA wc<>lift   Seated on lift:LAQ; slr/flutter kciking; add/abd; cycling   Walking forward x 4 widths; backward and side stepping x 2 widths using white barbell. Cga-sba with forward, min-cg assist with back, cga side stepping multiple widths encouraging indep balance (core strengthening). Standing holding to wall: df; pf; x20: marching; hip extension; squats x 10. STS from [place] chair using barbell ue support. Cues for balance UE/core balance and strengthening: barbell row x 8; push down x8 Suspended supine: cycling; add/abd  Pt requires buoyancy for support and to offload joints with strengthening exercises. Viscosity of the water is needed for resistance of strengthening; water current perturbations provides challenge to standing balance unsupported, requiring increased core activation.        PATIENT EDUCATION:  Education details: Proper gait pattern  Person educated: Patient and Spouse Education method: Explanation, Demonstration Education  comprehension: verbalized understanding and returned demonstration   HOME EXERCISE PROGRAM: Access Code: [email or web address] URL: [email or web address] Date: 04/03/2022 Prepared by: Claiborne Billings  Exercises - Seated Long Arc Quad  - 3 x daily - 7 x weekly - 2 sets - 10 reps - 5 hold - Seated March   - 3 x daily - 7 x weekly - 3 sets - 10 reps - Seated Heel Raise  - 3 x daily - 7 x weekly - 2 sets - 10 reps - Sit to Stand with Armchair  - 2 x daily - 7 x weekly - 2 sets - 10 reps  Patient Education - Walking with a Standard Walker   ASSESSMENT:  CLINICAL IMPRESSION: Pt tolerates very well returning for strengthening in prep for right THR. He reports feeling his legs are a little weaknsince last time but he demonstrates good movement and toleration to activity.  No complaints of any pain.  He does needs min-cga with weight shifting to gain immediate standing balance and gets good core engagement with all activities.  Goals ongoing.  [place]onte continues to be quite restricted in his right hip and with end stage severe OA with very audible crepitus.  He is extraordinarily functional considering his condition and the contractures that have developed.  He is fairly healthy from a medical standpoint.  His candidacy for hip replacements would be best considered by ortho MD and patient has scheduled a second opinion with Dr. Gaynelle Arabian just to be sure they have covered all their options.  His new referral is from his primary MD.  It was suggested that he continue PT, specifically aquatics, until seeing Dr. Wynelle Link to avoid loss of function and possible non candidacy for hip replacements.   From a PT standpoint, he would likely do well.  He has met somewhat of a plateau functionally as he was already fairly high functioning but demonstrates improvement on functional tests.  He would benefit from continued aquatics therapy until appt with ortho MD.     OBJECTIVE IMPAIRMENTS Abnormal gait, decreased activity  tolerance, decreased balance, decreased endurance, decreased mobility, difficulty walking, decreased ROM, decreased strength, hypomobility, impaired flexibility, and pain.   ACTIVITY LIMITATIONS community activity, driving, and meal prep.   PERSONAL FACTORS Age and 1-2 comorbidities: bil hip OA, weakness, 2 falls  are also affecting patient's functional outcome.  REHAB POTENTIAL: Good  CLINICAL DECISION MAKING: Stable/uncomplicated  EVALUATION COMPLEXITY: Low   GOALS: Goals reviewed with patient? Yes  SHORT TERM GOALS: Target date: 05/01/22  Be independent in initial HEP Baseline: Goal status: MET (04/29/22)  2.  Perform 5x sit to stand in < or = to 21 seconds with use of arms to reduce falls risk. Baseline: 30 (04/29/22) Goal status: MET   3.  Demonstrate Lt foot flat in stance phase of gait > or = to 50% of the time with rolling walker  Baseline:  Goal status: In progress   4.  Perform TUG in < or = to 55 seconds to reduce falls risk Baseline:  1 min, 13 seconds (04/29/22) Goal status: Met (07/18/22) 54 sec    LONG TERM GOALS: Target date: 11/22//23  Be independent in advanced HEP Baseline:  Goal status: MET  2.  Perform 5x sit to stand in < or = to 18 seconds with use of hands to reduce falls risk Baseline: 31 seconds  Goal status: in progress  3.  Perform TUG in < or = to 45 seconds to reduce falls risk Baseline:  1 min, 15 seconds  Goal status: In progress  4.  Improve LE strength to demonstrate symmetry with gait on level surface with use of rolling walker with min to mod UE support on rolling walker  Baseline:  Goal status: In progress  5.  Improve strength and endurance to stand for home tasks x 10-20 minutes without significant fatigue Baseline:  Goal status: MET (can do 10 min per his report but holding onto counters)      PLAN: PT FREQUENCY: 2x/week (09/04/22 will continue in pool 1 time per week until he sees Dr. Wynelle Link) If candidate for hip  replacements, and MD suggests continued PT, we will proceed with further therapy at that time.    PT DURATION: 8 weeks (09/04/22 until follow up with MD on 09/26/22)  PLANNED INTERVENTIONS: Therapeutic exercises, Therapeutic activity, Neuromuscular re-education, Balance training, Gait training, Patient/Family education, Joint mobilization, Stair training, Aquatic Therapy, Dry Needling, Electrical stimulation, Wheelchair mobility training, Cryotherapy, Moist heat, Taping, and Manual therapy  PLAN FOR NEXT SESSION: Proceed 1 time per week in pool until patient sees Dr. Wynelle Link, the proceed based on ortho MD suggestion.      Annamarie Major)  MPT 10/02/22 514p

## 2022-10-02 ENCOUNTER — Ambulatory Visit (HOSPITAL_BASED_OUTPATIENT_CLINIC_OR_DEPARTMENT_OTHER): Payer: Medicare Other | Attending: Orthopaedic Surgery | Admitting: Physical Therapy

## 2022-10-02 ENCOUNTER — Encounter (HOSPITAL_BASED_OUTPATIENT_CLINIC_OR_DEPARTMENT_OTHER): Payer: Self-pay | Admitting: Physical Therapy

## 2022-10-02 DIAGNOSIS — R293 Abnormal posture: Secondary | ICD-10-CM | POA: Diagnosis not present

## 2022-10-02 DIAGNOSIS — M6281 Muscle weakness (generalized): Secondary | ICD-10-CM | POA: Insufficient documentation

## 2022-10-02 DIAGNOSIS — R2689 Other abnormalities of gait and mobility: Secondary | ICD-10-CM | POA: Insufficient documentation

## 2022-10-02 DIAGNOSIS — M25552 Pain in left hip: Secondary | ICD-10-CM | POA: Insufficient documentation

## 2022-10-06 ENCOUNTER — Encounter: Payer: Self-pay | Admitting: Orthopaedic Surgery

## 2022-10-08 NOTE — Therapy (Signed)
OUTPATIENT PHYSICAL THERAPY TREATMENT NOTE   Patient Name: Jerry Gomez MRN: [id number] DOB:[birth year], 88 y.o., male Today's Date: 10/09/2022  PCP: Cassandria Anger, MD REFERRING PROVIDER: Mcarthur Rossetti, MD  END OF SESSION:   PT End of Session - 10/09/22 1418     Visit Number 25    Date for PT Re-Evaluation 10/30/22    Authorization Type Medicare    PT Start Time 2549    PT Stop Time 1500    PT Time Calculation (min) 43 min    Activity Tolerance Other (comment)    Behavior During Therapy WFL for tasks assessed/performed                       Past Medical History:  Diagnosis Date   Cancer ([place])    CLL   Cataract    COLONIC POLYPS 03/02/2009   EYE SURGERY, HX OF 09/05/2007   HEMORRHOIDS, HX OF    HYPERLIPIDEMIA 09/05/2007   HYPERTENSION 07/25/2007   OSTEOARTHRITIS 09/05/2007   SLEEP APNEA, OBSTRUCTIVE 09/05/2007   Past Surgical History:  Procedure Laterality Date   EYE SURGERY  10/22/13   left eye, cataract   Patient Active Problem List   Diagnosis Date Noted   Corn of foot 07/31/2022   Somnolence, daytime 02/24/2022   Falls 01/31/2022   Orthostatic hypotension 01/18/2022   Difficulty walking with leg weakness  01/10/2022   Avascular necrosis of bones of both hips with severe OA of bilateral hips  01/10/2022   Bilateral lower extremity edema 01/10/2022   Leg weakness, bilateral 08/22/2021   Spinal stenosis 03/27/2021   Lumbar radiculopathy 03/12/2021   Acute left-sided low back pain with left-sided sciatica 02/21/2021   Insomnia 10/28/2018   Bradycardia 10/09/2016   Sebaceous cyst 10/09/2016   Band keratopathy of right eye 07/02/2016   Corneal dellen of right eye 07/02/2016   Carotid bruit 04/08/2016   Acute sinusitis 11/01/2015   Elevated PSA 04/10/2015   Well adult exam 10/10/2014   Bruising 10/10/2014   PCO (posterior capsular opacification) 10/27/2013   Nuclear cataract 07/29/2012   Pseudophakia 07/29/2012   CLL (chronic  lymphocytic leukemia) ([place]) 06/09/2012   Cystoid macular edema 06/03/2012   Epiretinal membrane 06/03/2012   Edema 02/07/2012   Pain of right lower leg 02/07/2012   Dyslipidemia 09/05/2007   SLEEP APNEA, OBSTRUCTIVE 09/05/2007   OSTEOARTHRITIS 09/05/2007   Essential hypertension 07/25/2007    REFERRING DIAG:  M16.12 (ICD-10-CM) - Unilateral primary osteoarthritis, left hip  M16.11 (ICD-10-CM) - Unilateral primary osteoarthritis, right hip    THERAPY DIAG:  Muscle weakness (generalized)  Other abnormalities of gait and mobility  Pain in left hip  PERTINENT HISTORY: See above  PRECAUTIONS: fall  SUBJECTIVE: "I didn't walk this weekend as much as I should have, I am stiff"  PAIN:  Are you having pain? No   OBJECTIVE: (objective measures completed at initial evaluation unless otherwise dated)   OBJECTIVE: from evaluation on 4/256/23  DIAGNOSTIC FINDINGS: from MD note: He has complete loss of joint space on both sides and evidence of advanced osteoarthritis and likely component of osteonecrosis of both hips.   COGNITION:  Overall cognitive status: Within functional limits for tasks assessed     SENSATION: WFL  MUSCLE LENGTH: Bil hamstring length limited in sitting with reduced knee extension.   POSTURE:  Lt LE is flexed, forward head and flexed trunk.   PALPATION: NA  LE ROM:  Not formally tested.  Pt with reduced hamstring  length and IR/ER in sitting- limited by 25-50%  LE MMT:  MMT Right 04/11/2022 Right 07/18/22 Right 08/15/22 Left 04/11/2022 Left  07/18/22 Left  08/15/22 Left and Right 09/04/22  Hip flexion 4/5 4+/5 4+/5 4/5 4+/5 4+/5 All unchanged  Hip extension         Hip abduction 4-/5 4/5 4/5 4-/5 4/5 4/5   Hip adduction 4/5 4+/5 4+/5 4/5 4+/5 4+/5   Hip internal rotation         Hip external rotation         Knee flexion 4+/5 4+/5 4+/5 4+/5 4+/5 4+/5   Knee extension 4/5 4+/5 4+/5 4+/5 4+/5 4+/5   Ankle dorsiflexion 4+/5 4+/5 4+/5 4+/5 4+/5 4+/5    Ankle plantarflexion         Ankle inversion         Ankle eversion          (Blank rows = not tested)   FUNCTIONAL TESTS:  5 times sit to stand: 31 seconds with use of arms Timed up and go (TUG): 1 min, 12 seconds with rolling walker   04/29/22:  5 times sit to stand: 30 seconds with arms and verbal cues for full erect posture.  Timed up and go (TUG): 1 min 13 seconds with rolling walke 07/18/22: 5 times sit to stand: 26.65 seconds with arms and verbal cues for full erect posture.  Timed up and go (TUG): 54.12 sec with rolling walker  08/14/22: 5 times sit to stand: 22.23 seconds with arms and verbal cues for full erect posture.  Timed up and go (TUG): 47 sec with rolling walker  09/04/22: 5 times sit to stand: 21.65 seconds with arms and verbal cues for full erect posture.  Timed up and go (TUG): 1 min 7 sec with rolling walker GAIT: Distance walked: 100 ft Assistive device utilized: Walker - 2 wheeled Level of assistance: CGA Comments: VC's for inceased step length and heel strike on the Lt LE, mod UE support on rolling walker  TODAY'S TREATMENT: Treatment date 10/09/22: Pt seen for aquatic therapy today.  Water 3.25-4.8 ft in depth. Temp of water was 92.  Pt entered/exited the pool via lift. CGA wc<>lift   Seated on lift:LAQ; slr/flutter kicking; add/abd; cycling   Walking forward x 4 widths; backward x2 and side stepping x 1 widths using white barbell. Standing holding to wall: df; pf; x20: marching; hip extension; squats x 10. Hip circles cw & ccw x 10 STS from bench using barbell ue support. Cues for balance and min  UE/core balance and strengthening le staggered R then L: barbell row x 10; push down x10 with each position. Seated on bench: cycling  Pt requires buoyancy for support and to offload joints with strengthening exercises. Viscosity of the water is needed for resistance of strengthening; water current perturbations provides challenge to standing balance  unsupported, requiring increased core activation.  Treatment date 10/02/22: Pt seen for aquatic therapy today.  Water 3.25-4.8 ft in depth. Temp of water was 92.  Pt entered/exited the pool via lift. CGA wc<>lift   Seated on lift:LAQ; slr/flutter kicking; add/abd; cycling   Walking forward x 4 widths; backward and side stepping x 2 widths using white barbell encouraging indep balance (core strengthening). Standing holding to wall: df; pf; x20: marching; hip extension; squats x 10. Hip circles cw & ccw x 10 STS from bench using barbell ue support. Cues for balance and min  UE/core balance and strengthening: barbell row x 8;  push down x8 Seated on bench: LAQ; cycling  Pt requires buoyancy for support and to offload joints with strengthening exercises. Viscosity of the water is needed for resistance of strengthening; water current perturbations provides challenge to standing balance unsupported, requiring increased core activation.   Treatment date 08/15/22: ERO due to new referral from new MD Reviewed plan of care and suggestions for mobility outside the home which included use of wheelchair when leaving home  Treatment date 08/15/22: Re-assessment visit Lengthy discussion about progress, functional change, appropriateness of continued skilled care vs. Doing an independent program through [employer].  Discussed that the original reason for PT was to see if patient could gain enough function and strength to be a candidate for THA's but that he has not followed up with the ortho MD since he started in April.  Came up with some targets and a DC plan but wife wanted [place] to discuss with their son and he was out of town.    Treatment date 07/26/22: Pt seen for aquatic therapy today.  Water 3.25-4.8 ft in depth. Temp of water was 92.  Pt entered/exited the pool via lift. CGA wc<>lift   Seated on lift:LAQ; slr/flutter kciking; add/abd; cycling   Walking forward x 4 widths; backward and side  stepping x 2 widths using white barbell. Cga-sba with forward, min-cg assist with back, cga side stepping multiple widths encouraging indep balance (core strengthening). Standing holding to wall: df; pf; x20: marching; hip extension; squats x 10. STS from [place] chair using barbell ue support. Cues for balance UE/core balance and strengthening: barbell row x 8; push down x8 Suspended supine: cycling; add/abd  Pt requires buoyancy for support and to offload joints with strengthening exercises. Viscosity of the water is needed for resistance of strengthening; water current perturbations provides challenge to standing balance unsupported, requiring increased core activation.        PATIENT EDUCATION:  Education details: Proper gait pattern  Person educated: Patient and Spouse Education method: Explanation, Demonstration Education comprehension: verbalized understanding and returned demonstration   HOME EXERCISE PROGRAM: Access Code: [email or web address] URL: [email or web address] Date: 04/03/2022 Prepared by: Claiborne Billings  Exercises - Seated Long Arc Quad  - 3 x daily - 7 x weekly - 2 sets - 10 reps - 5 hold - Seated March   - 3 x daily - 7 x weekly - 3 sets - 10 reps - Seated Heel Raise  - 3 x daily - 7 x weekly - 2 sets - 10 reps - Sit to Stand with Armchair  - 2 x daily - 7 x weekly - 2 sets - 10 reps  Patient Education - Walking with a Standard Walker   ASSESSMENT:  CLINICAL IMPRESSION: Pt demonstrating increased stiffness in bilat hips maybe due to change in weather although pt does report little movement/walking over weekend. He is unable to abd hips sufficient enough to side step. He requires CGA for first 2 widths walking due to unsteadiness, difficulty advancing LE. He tolerates session without any complaints. Goals ongoing    Jerry Gomez continues to be quite restricted in his right hip and with end stage severe OA with very audible crepitus.  He is extraordinarily functional  considering his condition and the contractures that have developed.  He is fairly healthy from a medical standpoint.  His candidacy for hip replacements would be best considered by ortho MD and patient has scheduled a second opinion with Dr. Gaynelle Arabian just to be sure they have covered all their options.  His new referral is from his primary MD.  It was suggested that he continue PT, specifically aquatics, until seeing Dr. Wynelle Link to avoid loss of function and possible non candidacy for hip replacements.   From a PT standpoint, he would likely do well.  He has met somewhat of a plateau functionally as he was already fairly high functioning but demonstrates improvement on functional tests.  He would benefit from continued aquatics therapy until appt with ortho MD.     OBJECTIVE IMPAIRMENTS Abnormal gait, decreased activity tolerance, decreased balance, decreased endurance, decreased mobility, difficulty walking, decreased ROM, decreased strength, hypomobility, impaired flexibility, and pain.   ACTIVITY LIMITATIONS community activity, driving, and meal prep.   PERSONAL FACTORS Age and 1-2 comorbidities: bil hip OA, weakness, 2 falls  are also affecting patient's functional outcome.    REHAB POTENTIAL: Good  CLINICAL DECISION MAKING: Stable/uncomplicated  EVALUATION COMPLEXITY: Low   GOALS: Goals reviewed with patient? Yes  SHORT TERM GOALS: Target date: 05/01/22  Be independent in initial HEP Baseline: Goal status: MET (04/29/22)  2.  Perform 5x sit to stand in < or = to 21 seconds with use of arms to reduce falls risk. Baseline: 30 (04/29/22) Goal status: MET   3.  Demonstrate Lt foot flat in stance phase of gait > or = to 50% of the time with rolling walker  Baseline:  Goal status: In progress   4.  Perform TUG in < or = to 55 seconds to reduce falls risk Baseline:  1 min, 13 seconds (04/29/22) Goal status: Met (07/18/22) 54 sec    LONG TERM GOALS: Target date: 11/22//23  Be  independent in advanced HEP Baseline:  Goal status: MET  2.  Perform 5x sit to stand in < or = to 18 seconds with use of hands to reduce falls risk Baseline: 31 seconds  Goal status: in progress  3.  Perform TUG in < or = to 45 seconds to reduce falls risk Baseline:  1 min, 15 seconds  Goal status: In progress  4.  Improve LE strength to demonstrate symmetry with gait on level surface with use of rolling walker with min to mod UE support on rolling walker  Baseline:  Goal status: In progress  5.  Improve strength and endurance to stand for home tasks x 10-20 minutes without significant fatigue Baseline:  Goal status: MET (can do 10 min per his report but holding onto counters)      PLAN: PT FREQUENCY: 2x/week (09/04/22 will continue in pool 1 time per week until he sees Dr. Wynelle Link) If candidate for hip replacements, and MD suggests continued PT, we will proceed with further therapy at that time.    PT DURATION: 8 weeks (09/04/22 until follow up with MD on 09/26/22)  PLANNED INTERVENTIONS: Therapeutic exercises, Therapeutic activity, Neuromuscular re-education, Balance training, Gait training, Patient/Family education, Joint mobilization, Stair training, Aquatic Therapy, Dry Needling, Electrical stimulation, Wheelchair mobility training, Cryotherapy, Moist heat, Taping, and Manual therapy  PLAN FOR NEXT SESSION: Proceed 1 time per week in pool until patient sees Dr. Wynelle Link, the proceed based on ortho MD suggestion.      Annamarie Major) Kesia Dalto MPT 10/02/22 514p

## 2022-10-09 ENCOUNTER — Encounter (HOSPITAL_BASED_OUTPATIENT_CLINIC_OR_DEPARTMENT_OTHER): Payer: Self-pay | Admitting: Physical Therapy

## 2022-10-09 ENCOUNTER — Ambulatory Visit (HOSPITAL_BASED_OUTPATIENT_CLINIC_OR_DEPARTMENT_OTHER): Payer: Medicare Other | Attending: Orthopaedic Surgery | Admitting: Physical Therapy

## 2022-10-09 DIAGNOSIS — M6281 Muscle weakness (generalized): Secondary | ICD-10-CM | POA: Insufficient documentation

## 2022-10-09 DIAGNOSIS — R2689 Other abnormalities of gait and mobility: Secondary | ICD-10-CM | POA: Insufficient documentation

## 2022-10-09 DIAGNOSIS — M25552 Pain in left hip: Secondary | ICD-10-CM | POA: Insufficient documentation

## 2022-10-09 DIAGNOSIS — R293 Abnormal posture: Secondary | ICD-10-CM | POA: Diagnosis not present

## 2022-10-11 ENCOUNTER — Other Ambulatory Visit (HOSPITAL_BASED_OUTPATIENT_CLINIC_OR_DEPARTMENT_OTHER): Payer: Self-pay

## 2022-10-11 DIAGNOSIS — Z23 Encounter for immunization: Secondary | ICD-10-CM | POA: Diagnosis not present

## 2022-10-11 MED ORDER — COMIRNATY 30 MCG/0.3ML IM SUSY
PREFILLED_SYRINGE | INTRAMUSCULAR | 0 refills | Status: DC
  Filled 2022-10-11: qty 0.3, 1d supply, fill #0

## 2022-10-16 ENCOUNTER — Encounter (HOSPITAL_BASED_OUTPATIENT_CLINIC_OR_DEPARTMENT_OTHER): Payer: Self-pay | Admitting: Physical Therapy

## 2022-10-16 ENCOUNTER — Ambulatory Visit (HOSPITAL_BASED_OUTPATIENT_CLINIC_OR_DEPARTMENT_OTHER): Payer: Medicare Other | Admitting: Physical Therapy

## 2022-10-16 DIAGNOSIS — M25552 Pain in left hip: Secondary | ICD-10-CM | POA: Diagnosis not present

## 2022-10-16 DIAGNOSIS — R293 Abnormal posture: Secondary | ICD-10-CM | POA: Diagnosis not present

## 2022-10-16 DIAGNOSIS — R2689 Other abnormalities of gait and mobility: Secondary | ICD-10-CM | POA: Diagnosis not present

## 2022-10-16 DIAGNOSIS — M6281 Muscle weakness (generalized): Secondary | ICD-10-CM | POA: Diagnosis not present

## 2022-10-16 NOTE — Therapy (Addendum)
OUTPATIENT PHYSICAL THERAPY TREATMENT NOTE PHYSICAL THERAPY DISCHARGE SUMMARY  Visits from Start of Care: 26  Current functional level related to goals / functional outcomes: Pt with improvement and is as safe as able requiring assistance with all function   Remaining deficits: Advanced Oa hips    Education / Equipment: Management of condition/HEP   Patient agrees to discharge. Patient goals were partially met. Patient is being discharged due to maximized rehab potential.    Patient Name: Jerry Gomez MRN: [id number] DOB:[birth year], 88 y.o., male Today's Date: 10/16/2022  PCP: Cassandria Anger, MD REFERRING PROVIDER: Mcarthur Rossetti, MD  END OF SESSION:   PT End of Session - 10/16/22 1844     Visit Number 26    Date for PT Re-Evaluation 10/30/22    Authorization Type Medicare    PT Start Time 2751    PT Stop Time 1500    PT Time Calculation (min) 45 min    Activity Tolerance Other (comment);Patient tolerated treatment well    Behavior During Therapy [facility] for tasks assessed/performed                        Past Medical History:  Diagnosis Date   Cancer ([place])    CLL   Cataract    COLONIC POLYPS 03/02/2009   EYE SURGERY, HX OF 09/05/2007   HEMORRHOIDS, HX OF    HYPERLIPIDEMIA 09/05/2007   HYPERTENSION 07/25/2007   OSTEOARTHRITIS 09/05/2007   SLEEP APNEA, OBSTRUCTIVE 09/05/2007   Past Surgical History:  Procedure Laterality Date   EYE SURGERY  10/22/13   left eye, cataract   Patient Active Problem List   Diagnosis Date Noted   Corn of foot 07/31/2022   Somnolence, daytime 02/24/2022   Falls 01/31/2022   Orthostatic hypotension 01/18/2022   Difficulty walking with leg weakness  01/10/2022   Avascular necrosis of bones of both hips with severe OA of bilateral hips  01/10/2022   Bilateral lower extremity edema 01/10/2022   Leg weakness, bilateral 08/22/2021   Spinal stenosis 03/27/2021   Lumbar radiculopathy 03/12/2021   Acute  left-sided low back pain with left-sided sciatica 02/21/2021   Insomnia 10/28/2018   Bradycardia 10/09/2016   Sebaceous cyst 10/09/2016   Band keratopathy of right eye 07/02/2016   Corneal dellen of right eye 07/02/2016   Carotid bruit 04/08/2016   Acute sinusitis 11/01/2015   Elevated PSA 04/10/2015   Well adult exam 10/10/2014   Bruising 10/10/2014   PCO (posterior capsular opacification) 10/27/2013   Nuclear cataract 07/29/2012   Pseudophakia 07/29/2012   CLL (chronic lymphocytic leukemia) ([place]) 06/09/2012   Cystoid macular edema 06/03/2012   Epiretinal membrane 06/03/2012   Edema 02/07/2012   Pain of right lower leg 02/07/2012   Dyslipidemia 09/05/2007   SLEEP APNEA, OBSTRUCTIVE 09/05/2007   OSTEOARTHRITIS 09/05/2007   Essential hypertension 07/25/2007    REFERRING DIAG:  M16.12 (ICD-10-CM) - Unilateral primary osteoarthritis, left hip  M16.11 (ICD-10-CM) - Unilateral primary osteoarthritis, right hip    THERAPY DIAG:  Muscle weakness (generalized)  Other abnormalities of gait and mobility  Pain in left hip  Abnormal posture  PERTINENT HISTORY: See above  PRECAUTIONS: fall  SUBJECTIVE: "I'd like to be on wait list for next week.  I have questions about the surgery that no one has yet answered"  PAIN:  Are you having pain? No   OBJECTIVE: (objective measures completed at initial evaluation unless otherwise dated)   OBJECTIVE: from evaluation on 4/256/23  DIAGNOSTIC  FINDINGS: from MD note: He has complete loss of joint space on both sides and evidence of advanced osteoarthritis and likely component of osteonecrosis of both hips.   COGNITION:  Overall cognitive status: Within functional limits for tasks assessed     SENSATION: WFL  MUSCLE LENGTH: Bil hamstring length limited in sitting with reduced knee extension.   POSTURE:  Lt LE is flexed, forward head and flexed trunk.   PALPATION: NA  LE ROM:  Not formally tested.  Pt with reduced  hamstring length and IR/ER in sitting- limited by 25-50%  LE MMT:  MMT Right 04/11/2022 Right 07/18/22 Right 08/15/22 Left 04/11/2022 Left  07/18/22 Left  08/15/22 Left and Right 09/04/22  Hip flexion 4/5 4+/5 4+/5 4/5 4+/5 4+/5 All unchanged  Hip extension         Hip abduction 4-/5 4/5 4/5 4-/5 4/5 4/5   Hip adduction 4/5 4+/5 4+/5 4/5 4+/5 4+/5   Hip internal rotation         Hip external rotation         Knee flexion 4+/5 4+/5 4+/5 4+/5 4+/5 4+/5   Knee extension 4/5 4+/5 4+/5 4+/5 4+/5 4+/5   Ankle dorsiflexion 4+/5 4+/5 4+/5 4+/5 4+/5 4+/5   Ankle plantarflexion         Ankle inversion         Ankle eversion          (Blank rows = not tested)   FUNCTIONAL TESTS:  5 times sit to stand: 31 seconds with use of arms Timed up and go (TUG): 1 min, 12 seconds with rolling walker   04/29/22:  5 times sit to stand: 30 seconds with arms and verbal cues for full erect posture.  Timed up and go (TUG): 1 min 13 seconds with rolling walke 07/18/22: 5 times sit to stand: 26.65 seconds with arms and verbal cues for full erect posture.  Timed up and go (TUG): 54.12 sec with rolling walker  08/14/22: 5 times sit to stand: 22.23 seconds with arms and verbal cues for full erect posture.  Timed up and go (TUG): 47 sec with rolling walker  09/04/22: 5 times sit to stand: 21.65 seconds with arms and verbal cues for full erect posture.  Timed up and go (TUG): 1 min 7 sec with rolling walker GAIT: Distance walked: 100 ft Assistive device utilized: Walker - 2 wheeled Level of assistance: CGA Comments: VC's for inceased step length and heel strike on the Lt LE, mod UE support on rolling walker  TODAY'S TREATMENT: Treatment date 10/16/22:  Self care: pt instruction on equipment use at home after [facility] scheduled for next month. VC and demonstration with proper transfer technique in and out of shower, Use of chair lift to second floor where bed and bath are located. Information on Home care for assistance  with ADL's and overall safe mobility after returning home. Use of walker and need for 3-in-1 commode. Discussed nutrition, possible issues with constipation, what to expect with incision and recovery  Pt seen for aquatic therapy today.  Water 3.25-4.8 ft in depth. Temp of water was 92.  Pt entered/exited the pool via lift. CGA wc<>lift   Seated on lift:LAQ; slr/flutter kicking; add/abd; cycling   Walking forward backward and side stepping using white barbell. Standing holding to wall: df; pf; x20: marching; hip extension; squats x 10. Hip circles cw & ccw x 10 Seated on bench: cycling  Pt requires buoyancy for support and to offload joints with  strengthening exercises. Viscosity of the water is needed for resistance of strengthening; water current perturbations provides challenge to standing balance unsupported, requiring increased core activation.   Treatment date 10/09/22: Pt seen for aquatic therapy today.  Water 3.25-4.8 ft in depth. Temp of water was 92.  Pt entered/exited the pool via lift. CGA wc<>lift   Seated on lift:LAQ; slr/flutter kicking; add/abd; cycling   Walking forward x 4 widths; backward x2 and side stepping x 1 widths using white barbell. Standing holding to wall: df; pf; x20: marching; hip extension; squats x 10. Hip circles cw & ccw x 10 STS from bench using barbell ue support. Cues for balance and min  UE/core balance and strengthening le staggered R then L: barbell row x 10; push down x10 with each position. Seated on bench: cycling  Pt requires buoyancy for support and to offload joints with strengthening exercises. Viscosity of the water is needed for resistance of strengthening; water current perturbations provides challenge to standing balance unsupported, requiring increased core activation.  Treatment date 10/02/22: Pt seen for aquatic therapy today.  Water 3.25-4.8 ft in depth. Temp of water was 92.  Pt entered/exited the pool via lift. CGA  wc<>lift   Seated on lift:LAQ; slr/flutter kicking; add/abd; cycling   Walking forward x 4 widths; backward and side stepping x 2 widths using white barbell encouraging indep balance (core strengthening). Standing holding to wall: df; pf; x20: marching; hip extension; squats x 10. Hip circles cw & ccw x 10 STS from bench using barbell ue support. Cues for balance and min  UE/core balance and strengthening: barbell row x 8; push down x8 Seated on bench: LAQ; cycling  Pt requires buoyancy for support and to offload joints with strengthening exercises. Viscosity of the water is needed for resistance of strengthening; water current perturbations provides challenge to standing balance unsupported, requiring increased core activation.   Treatment date 08/15/22: ERO due to new referral from new MD Reviewed plan of care and suggestions for mobility outside the home which included use of wheelchair when leaving home  Treatment date 08/15/22: Re-assessment visit Lengthy discussion about progress, functional change, appropriateness of continued skilled care vs. Doing an independent program through [employer].  Discussed that the original reason for PT was to see if patient could gain enough function and strength to be a candidate for THA's but that he has not followed up with the ortho MD since he started in April.  Came up with some targets and a DC plan but wife wanted [place] to discuss with their son and he was out of town.    Treatment date 07/26/22: Pt seen for aquatic therapy today.  Water 3.25-4.8 ft in depth. Temp of water was 92.  Pt entered/exited the pool via lift. CGA wc<>lift   Seated on lift:LAQ; slr/flutter kciking; add/abd; cycling   Walking forward x 4 widths; backward and side stepping x 2 widths using white barbell. Cga-sba with forward, min-cg assist with back, cga side stepping multiple widths encouraging indep balance (core strengthening). Standing holding to wall: df; pf; x20:  marching; hip extension; squats x 10. STS from Nordic chair using barbell ue support. Cues for balance UE/core balance and strengthening: barbell row x 8; push down x8 Suspended supine: cycling; add/abd  Pt requires buoyancy for support and to offload joints with strengthening exercises. Viscosity of the water is needed for resistance of strengthening; water current perturbations provides challenge to standing balance unsupported, requiring increased core activation.  PATIENT EDUCATION:  Education details: Proper gait pattern  Person educated: Patient and Spouse Education method: Explanation, Demonstration Education comprehension: verbalized understanding and returned demonstration   HOME EXERCISE PROGRAM: Access Code: [email or web address] URL: [email or web address] Date: 04/03/2022 Prepared by: Claiborne Billings  Exercises - Seated Long Arc Quad  - 3 x daily - 7 x weekly - 2 sets - 10 reps - 5 hold - Seated March   - 3 x daily - 7 x weekly - 3 sets - 10 reps - Seated Heel Raise  - 3 x daily - 7 x weekly - 2 sets - 10 reps - Sit to Stand with Armchair  - 2 x daily - 7 x weekly - 2 sets - 10 reps  Patient Education - Walking with a Standard Walker   ASSESSMENT:  CLINICAL IMPRESSION: Pt instructed on post surgical care. His last aquatic session is today although he will try to return next week by using wait list as no appointments available at this time.  He will be Dc'ed next week regardless at end of cert with anticipation of surgical intervention of THR. Today he completes session well. He continues to be limited especially in abd although continues to report no pain. He is limited in rle stride length with little success at increasing with vc due to advanced bony changes with oa.  He will benefit from further aquatic intervention to maximize strength and rom of bilat hips to improve post surgical outcome.    OBJECTIVE IMPAIRMENTS Abnormal gait, decreased activity tolerance,  decreased balance, decreased endurance, decreased mobility, difficulty walking, decreased ROM, decreased strength, hypomobility, impaired flexibility, and pain.   ACTIVITY LIMITATIONS community activity, driving, and meal prep.   PERSONAL FACTORS Age and 1-2 comorbidities: bil hip OA, weakness, 2 falls  are also affecting patient's functional outcome.    REHAB POTENTIAL: Good  CLINICAL DECISION MAKING: Stable/uncomplicated  EVALUATION COMPLEXITY: Low   GOALS: Goals reviewed with patient? Yes  SHORT TERM GOALS: Target date: 05/01/22  Be independent in initial HEP Baseline: Goal status: MET (04/29/22)  2.  Perform 5x sit to stand in < or = to 21 seconds with use of arms to reduce falls risk. Baseline: 30 (04/29/22) Goal status: MET   3.  Demonstrate Lt foot flat in stance phase of gait > or = to 50% of the time with rolling walker  Baseline:  Goal status: In progress   4.  Perform TUG in < or = to 55 seconds to reduce falls risk Baseline:  1 min, 13 seconds (04/29/22) Goal status: Met (07/18/22) 54 sec    LONG TERM GOALS: Target date: 11/22//23  Be independent in advanced HEP Baseline:  Goal status: MET  2.  Perform 5x sit to stand in < or = to 18 seconds with use of hands to reduce falls risk Baseline: 31 seconds  Goal status: in progress  3.  Perform TUG in < or = to 45 seconds to reduce falls risk Baseline:  1 min, 15 seconds  Goal status: In progress  4.  Improve LE strength to demonstrate symmetry with gait on level surface with use of rolling walker with min to mod UE support on rolling walker  Baseline:  Goal status: In progress  5.  Improve strength and endurance to stand for home tasks x 10-20 minutes without significant fatigue Baseline:  Goal status: MET (can do 10 min per his report but holding onto counters)      PLAN: PT FREQUENCY: 2x/week (09/04/22 will continue in  pool 1 time per week until he sees Dr. Wynelle Link) If candidate for hip replacements,  and MD suggests continued PT, we will proceed with further therapy at that time.    PT DURATION: 8 weeks (09/04/22 until follow up with MD on 09/26/22)  PLANNED INTERVENTIONS: Therapeutic exercises, Therapeutic activity, Neuromuscular re-education, Balance training, Gait training, Patient/Family education, Joint mobilization, Stair training, Aquatic Therapy, Dry Needling, Electrical stimulation, Wheelchair mobility training, Cryotherapy, Moist heat, Taping, and Manual therapy  PLAN FOR NEXT SESSION: Proceed 1 time per week in pool until patient sees Dr. Wynelle Link, the proceed based on ortho MD suggestion.      [facility]) Kimorah Ridolfi MPT 10/02/22 514p  Addend 12/06/22 904am

## 2022-10-23 ENCOUNTER — Encounter: Payer: Self-pay | Admitting: Internal Medicine

## 2022-10-23 ENCOUNTER — Ambulatory Visit (INDEPENDENT_AMBULATORY_CARE_PROVIDER_SITE_OTHER): Payer: Medicare Other | Admitting: Internal Medicine

## 2022-10-23 VITALS — BP 116/62 | HR 73 | Temp 98.0°F | Ht 67.0 in | Wt 127.0 lb

## 2022-10-23 DIAGNOSIS — M1611 Unilateral primary osteoarthritis, right hip: Secondary | ICD-10-CM

## 2022-10-23 DIAGNOSIS — G44319 Acute post-traumatic headache, not intractable: Secondary | ICD-10-CM

## 2022-10-23 DIAGNOSIS — W19XXXA Unspecified fall, initial encounter: Secondary | ICD-10-CM

## 2022-10-23 DIAGNOSIS — C9110 Chronic lymphocytic leukemia of B-cell type not having achieved remission: Secondary | ICD-10-CM

## 2022-10-23 DIAGNOSIS — R202 Paresthesia of skin: Secondary | ICD-10-CM | POA: Diagnosis not present

## 2022-10-23 DIAGNOSIS — Z01818 Encounter for other preprocedural examination: Secondary | ICD-10-CM

## 2022-10-23 DIAGNOSIS — I951 Orthostatic hypotension: Secondary | ICD-10-CM

## 2022-10-23 NOTE — Assessment & Plan Note (Addendum)
Head CT - a fall backwards last night - no LOC C-spine x-ray

## 2022-10-23 NOTE — Assessment & Plan Note (Signed)
Seeing Dr Wynelle Link Will ref to Dr Nelva Bush for R hip mobility, R hand weakness - needs EMG Thx

## 2022-10-23 NOTE — Progress Notes (Signed)
Subjective:  Patient ID: Jerry Gomez, male    DOB: 1931-01-04  Age: 88 y.o. MRN: [id number]  CC: Follow-up   HPI Jerry Gomez presents for a fall backwards last night - no LOC; hit head at 10 pm, no LOC. Mild HA F/u CLL C/o tingling  in B hands  C/o R hand weakness - weeks He is here with his wife and his son In a w/c  He has severe stiffness in both hips more on the right side.  No pain.  He is limited in his mobility.  She is asking for medical clearance for the right hip arthroplasty on December 11 Outpatient Medications Prior to Visit  Medication Sig Dispense Refill   Artificial Tear Ointment (ARTIFICIAL TEARS) ointment Place into both eyes nightly.     aspirin 81 MG EC tablet Take 81 mg by mouth daily. Swallow whole.     Cholecalciferol (VITAMIN D) 2000 units tablet Take 2,000 Units by mouth daily.     COVID-19 mRNA vaccine 2023-2024 (COMIRNATY) syringe Inject into the muscle. 0.3 mL 0   Ensure (ENSURE) Take 237 mLs by mouth.     furosemide (LASIX) 20 MG tablet TAKE 1 TABLET BY MOUTH EVERY DAY 90 tablet 1   Multiple Vitamins-Minerals (MULTIVITAMIN ADULT) CHEW Chew by mouth.     No facility-administered medications prior to visit.    ROS: Review of Systems  Constitutional:  Negative for appetite change, fatigue and unexpected weight change.  HENT:  Negative for congestion, nosebleeds, sneezing, sore throat and trouble swallowing.   Eyes:  Negative for itching and visual disturbance.  Respiratory:  Negative for cough.   Cardiovascular:  Negative for chest pain, palpitations and leg swelling.  Gastrointestinal:  Negative for abdominal distention, blood in stool, diarrhea and nausea.  Genitourinary:  Negative for frequency and hematuria.  Musculoskeletal:  Positive for arthralgias and gait problem. Negative for back pain, joint swelling, neck pain and neck stiffness.  Skin:  Negative for rash.  Neurological:  Positive for weakness. Negative for dizziness, tremors,  syncope and speech difficulty.  Hematological:  Negative for adenopathy. Bruises/bleeds easily.  Psychiatric/Behavioral:  Negative for agitation, decreased concentration, dysphoric mood and sleep disturbance. The patient is not nervous/anxious.     Objective:  BP 116/62 (BP Location: Right Arm, Patient Position: Sitting, Cuff Size: Normal)   Pulse 73   Temp 98 F (36.7 C) (Oral)   Ht '5\' 7"'$  (1.702 m)   Wt 127 lb (57.6 kg)   SpO2 98%   BMI 19.89 kg/m   BP Readings from Last 3 Encounters:  10/23/22 116/62  07/31/22 (!) 110/48  07/24/22 (!) 122/47    Wt Readings from Last 3 Encounters:  10/23/22 127 lb (57.6 kg)  08/19/22 130 lb (59 kg)  07/31/22 127 lb 6.4 oz (57.8 kg)    Physical Exam Constitutional:      General: He is not in acute distress.    Appearance: Normal appearance. He is well-developed.     Comments: NAD  Eyes:     Conjunctiva/sclera: Conjunctivae normal.     Pupils: Pupils are equal, round, and reactive to light.  Neck:     Thyroid: No thyromegaly.     Vascular: No JVD.  Cardiovascular:     Rate and Rhythm: Normal rate and regular rhythm.     Heart sounds: Normal heart sounds. No murmur heard.    No friction rub. No gallop.  Pulmonary:     Effort: Pulmonary effort is normal. No  respiratory distress.     Breath sounds: Normal breath sounds. No wheezing or rales.  Chest:     Chest wall: No tenderness.  Abdominal:     General: Bowel sounds are normal. There is no distension.     Palpations: Abdomen is soft. There is no mass.     Tenderness: There is no abdominal tenderness. There is no guarding or rebound.  Musculoskeletal:        General: No tenderness. Normal range of motion.     Cervical back: Normal range of motion.  Lymphadenopathy:     Cervical: No cervical adenopathy.  Skin:    General: Skin is warm and dry.     Findings: No rash.  Neurological:     Mental Status: He is alert and oriented to person, place, and time.     Cranial Nerves: No  cranial nerve deficit.     Motor: No abnormal muscle tone.     Coordination: Coordination normal.     Gait: Gait normal.     Deep Tendon Reflexes: Reflexes are normal and symmetric.  Psychiatric:        Behavior: Behavior normal.        Thought Content: Thought content normal.        Judgment: Judgment normal.   He is here with his wife and his son In a w/c  He has severe stiffness in both hips more on the right side.  No pain.  He is limited in his mobility.  Right grip with mild weakness    A total time of 45 minutes was spent preparing to see the patient, reviewing tests, x-rays, operative reports and other medical records.  Also, obtaining history and performing comprehensive physical exam.  Additionally, counseling the patient regarding his fall and the risk of subdural hematoma.   Finally, documenting clinical information in the health records, coordination of care, educating the patient and the family regarding potential risks of surgery. It is a complex case.   Lab Results  Component Value Date   WBC 36.2 (H) 07/24/2022   HGB 12.3 (L) 07/24/2022   HCT 36.2 (L) 07/24/2022   PLT 129 (L) 07/24/2022   GLUCOSE 118 (H) 07/24/2022   CHOL 119 08/14/2021   TRIG 57.0 08/14/2021   HDL 53.30 08/14/2021   LDLCALC 55 08/14/2021   ALT 16 07/24/2022   AST 20 07/24/2022   NA 138 07/24/2022   K 4.1 07/24/2022   CL 103 07/24/2022   CREATININE 0.82 07/24/2022   BUN 25 (H) 07/24/2022   CO2 29 07/24/2022   TSH 1.33 04/30/2022   PSA 8.51 (H) 08/14/2021    No results found.  Assessment & Plan:   Problem List Items Addressed This Visit     Paresthesia   Relevant Orders   DG Cervical Spine Complete (Completed)   Osteoarthritis of right hip    Seeing Dr Wynelle Link Will ref to Dr Nelva Bush for R hip mobility, R hand weakness - needs EMG Thx      Orthostatic hypotension    May become a problem in the post-surgical recovery stage      Fall - Primary    Head CT - a fall backwards  last night - no LOC C-spine x-ray       Relevant Orders   CT HEAD WO CONTRAST (5MM) (Completed)   DG Cervical Spine Complete (Completed)   Encounter for preoperative assessment    I would cancel December 11th surgery date. My inclination would be to  advise  against having right hip replacement altogether, especially in the view of the same problem with his left hip: potentially Arjan has a lot to lose in case of surgical complications and not as much to gain in case of successful surgery, unless his left hip is operated on as well. Our patients with CLL tend to have more surgical complications with bleeding and infections.  He should probably see Dr. Earlie Server for a consultation before surgery.  He should have to see a cardiologist for cardiac clearance before surgery as well. Thank you      CLL (chronic lymphocytic leukemia) ([place])    This may carry a higher risk of peri-operative infection and bleeding      Acute post-traumatic headache, not intractable   Relevant Orders   CT HEAD WO CONTRAST (5MM) (Completed)   DG Cervical Spine Complete (Completed)      No orders of the defined types were placed in this encounter.     Follow-up: Return in about 3 months (around 01/23/2023) for a follow-up visit.  Walker Kehr, MD

## 2022-10-24 ENCOUNTER — Ambulatory Visit
Admission: RE | Admit: 2022-10-24 | Discharge: 2022-10-24 | Disposition: A | Discharge status: Home / Self Care | Payer: Medicare Other | Source: Ambulatory Visit | Attending: Internal Medicine | Admitting: Internal Medicine

## 2022-10-24 DIAGNOSIS — S0990XA Unspecified injury of head, initial encounter: Secondary | ICD-10-CM | POA: Diagnosis not present

## 2022-10-24 DIAGNOSIS — G44319 Acute post-traumatic headache, not intractable: Secondary | ICD-10-CM

## 2022-10-24 DIAGNOSIS — Z043 Encounter for examination and observation following other accident: Secondary | ICD-10-CM | POA: Diagnosis not present

## 2022-10-24 DIAGNOSIS — W19XXXA Unspecified fall, initial encounter: Secondary | ICD-10-CM

## 2022-10-25 ENCOUNTER — Encounter: Payer: Self-pay | Admitting: Internal Medicine

## 2022-10-25 DIAGNOSIS — Z01818 Encounter for other preprocedural examination: Secondary | ICD-10-CM | POA: Insufficient documentation

## 2022-10-28 ENCOUNTER — Encounter: Payer: Self-pay | Admitting: Internal Medicine

## 2022-10-28 ENCOUNTER — Encounter (HOSPITAL_BASED_OUTPATIENT_CLINIC_OR_DEPARTMENT_OTHER): Payer: Self-pay | Admitting: Physical Therapy

## 2022-11-04 ENCOUNTER — Encounter: Payer: Self-pay | Admitting: Internal Medicine

## 2022-11-04 DIAGNOSIS — R202 Paresthesia of skin: Secondary | ICD-10-CM | POA: Insufficient documentation

## 2022-11-04 NOTE — Assessment & Plan Note (Signed)
This may carry a higher risk of peri-operative infection and bleeding

## 2022-11-04 NOTE — Assessment & Plan Note (Signed)
I would cancel December 11th surgery date. My inclination would be to advise  against having right hip replacement altogether, especially in the view of the same problem with his left hip: potentially Jerry Gomez has a lot to lose in case of surgical complications and not as much to gain in case of successful surgery, unless his left hip is operated on as well. Our patients with CLL tend to have more surgical complications with bleeding and infections.  He should probably see Dr. Earlie Server for a consultation before surgery.  He should have to see a cardiologist for cardiac clearance before surgery as well. Thank you

## 2022-11-04 NOTE — Assessment & Plan Note (Signed)
May become a problem in the post-surgical recovery stage

## 2022-11-06 ENCOUNTER — Other Ambulatory Visit (HOSPITAL_COMMUNITY): Payer: Medicare Other

## 2022-11-07 ENCOUNTER — Encounter (HOSPITAL_COMMUNITY): Admission: RE | Admit: 2022-11-07 | Payer: Medicare Other | Source: Ambulatory Visit

## 2022-11-14 ENCOUNTER — Ambulatory Visit: Payer: Medicare Other | Admitting: Podiatry

## 2022-11-18 ENCOUNTER — Inpatient Hospital Stay: Admit: 2022-11-18 | Payer: Medicare Other | Admitting: Orthopedic Surgery

## 2022-11-18 SURGERY — ARTHROPLASTY, HIP, TOTAL, ANTERIOR APPROACH
Anesthesia: Choice | Site: Hip | Laterality: Right

## 2022-11-21 ENCOUNTER — Ambulatory Visit: Payer: Medicare Other | Admitting: Podiatry

## 2022-12-24 DIAGNOSIS — Z961 Presence of intraocular lens: Secondary | ICD-10-CM | POA: Diagnosis not present

## 2022-12-24 DIAGNOSIS — H524 Presbyopia: Secondary | ICD-10-CM | POA: Diagnosis not present

## 2022-12-24 DIAGNOSIS — H18421 Band keratopathy, right eye: Secondary | ICD-10-CM | POA: Diagnosis not present

## 2022-12-24 DIAGNOSIS — H353132 Nonexudative age-related macular degeneration, bilateral, intermediate dry stage: Secondary | ICD-10-CM | POA: Diagnosis not present

## 2022-12-24 DIAGNOSIS — H5211 Myopia, right eye: Secondary | ICD-10-CM | POA: Diagnosis not present

## 2022-12-24 DIAGNOSIS — H5202 Hypermetropia, left eye: Secondary | ICD-10-CM | POA: Diagnosis not present

## 2022-12-24 DIAGNOSIS — H04123 Dry eye syndrome of bilateral lacrimal glands: Secondary | ICD-10-CM | POA: Diagnosis not present

## 2023-01-23 ENCOUNTER — Ambulatory Visit: Payer: Medicare Other | Admitting: Internal Medicine

## 2023-01-27 ENCOUNTER — Inpatient Hospital Stay: Payer: Medicare Other | Attending: Internal Medicine

## 2023-01-27 ENCOUNTER — Inpatient Hospital Stay (HOSPITAL_BASED_OUTPATIENT_CLINIC_OR_DEPARTMENT_OTHER): Payer: Medicare Other | Admitting: Internal Medicine

## 2023-01-27 ENCOUNTER — Other Ambulatory Visit: Payer: Self-pay

## 2023-01-27 VITALS — BP 118/52 | HR 70 | Temp 97.8°F | Resp 16 | Wt 133.8 lb

## 2023-01-27 DIAGNOSIS — Z7982 Long term (current) use of aspirin: Secondary | ICD-10-CM | POA: Diagnosis not present

## 2023-01-27 DIAGNOSIS — Z79899 Other long term (current) drug therapy: Secondary | ICD-10-CM | POA: Insufficient documentation

## 2023-01-27 DIAGNOSIS — C9110 Chronic lymphocytic leukemia of B-cell type not having achieved remission: Secondary | ICD-10-CM

## 2023-01-27 DIAGNOSIS — I10 Essential (primary) hypertension: Secondary | ICD-10-CM | POA: Insufficient documentation

## 2023-01-27 LAB — CBC WITH DIFFERENTIAL (CANCER CENTER ONLY)
Abs Immature Granulocytes: 0.04 10*3/uL (ref 0.00–0.07)
Basophils Absolute: 0.1 10*3/uL (ref 0.0–0.1)
Basophils Relative: 0 %
Eosinophils Absolute: 0.1 10*3/uL (ref 0.0–0.5)
Eosinophils Relative: 0 %
HCT: 38.1 % — ABNORMAL LOW (ref 39.0–52.0)
Hemoglobin: 12.5 g/dL — ABNORMAL LOW (ref 13.0–17.0)
Immature Granulocytes: 0 %
Lymphocytes Relative: 90 %
Lymphs Abs: 34.9 10*3/uL — ABNORMAL HIGH (ref 0.7–4.0)
MCH: 32 pg (ref 26.0–34.0)
MCHC: 32.8 g/dL (ref 30.0–36.0)
MCV: 97.4 fL (ref 80.0–100.0)
Monocytes Absolute: 0.7 10*3/uL (ref 0.1–1.0)
Monocytes Relative: 2 %
Neutro Abs: 2.9 10*3/uL (ref 1.7–7.7)
Neutrophils Relative %: 8 %
Platelet Count: 141 10*3/uL — ABNORMAL LOW (ref 150–400)
RBC: 3.91 MIL/uL — ABNORMAL LOW (ref 4.22–5.81)
RDW: 13.1 % (ref 11.5–15.5)
WBC Count: 38.7 10*3/uL — ABNORMAL HIGH (ref 4.0–10.5)
nRBC: 0 % (ref 0.0–0.2)

## 2023-01-27 LAB — LACTATE DEHYDROGENASE: LDH: 163 U/L (ref 98–192)

## 2023-01-27 LAB — CMP (CANCER CENTER ONLY)
ALT: 11 U/L (ref 0–44)
AST: 18 U/L (ref 15–41)
Albumin: 4.2 g/dL (ref 3.5–5.0)
Alkaline Phosphatase: 89 U/L (ref 38–126)
Anion gap: 5 (ref 5–15)
BUN: 18 mg/dL (ref 8–23)
CO2: 31 mmol/L (ref 22–32)
Calcium: 9 mg/dL (ref 8.9–10.3)
Chloride: 103 mmol/L (ref 98–111)
Creatinine: 0.64 mg/dL (ref 0.61–1.24)
GFR, Estimated: >60 mL/min (ref >60)
Glucose, Bld: 91 mg/dL (ref 70–99)
Potassium: 4 mmol/L (ref 3.5–5.1)
Sodium: 139 mmol/L (ref 135–145)
Total Bilirubin: 0.9 mg/dL (ref 0.3–1.2)
Total Protein: 6.1 g/dL — ABNORMAL LOW (ref 6.5–8.1)

## 2023-01-27 LAB — IRON AND IRON BINDING CAPACITY (CC-WL,HP ONLY)
Iron: 56 ug/dL (ref 45–182)
Saturation Ratios: 18 % (ref 17.9–39.5)
TIBC: 312 ug/dL (ref 250–450)
UIBC: 256 ug/dL (ref 117–376)

## 2023-01-27 NOTE — Progress Notes (Signed)
[place] [phone]   [phone]  OFFICE PROGRESS NOTE  [name], [name], MD [place] [address]  DIAGNOSIS: Stage 0 Chronic lymphocytic leukemia   PRIOR THERAPY: None   CURRENT THERAPY: Observation  INTERVAL HISTORY: Jerry Gomez 89 y.o. male returns to the clinic today for follow-up visit accompanied by his wife Stanton Kidney.  The patient is feeling fine today with no concerning complaints.  He denied having any current chest pain, shortness of breath, cough or hemoptysis.  He continues to have mild fatigue.  He has no nausea, vomiting, diarrhea or constipation.  He has no headache or visual changes.  He is here today for evaluation with repeat CBC, comprehensive metabolic panel and LDH.    MEDICAL HISTORY: Past Medical History:  Diagnosis Date   Cancer ([place])    CLL   Cataract    COLONIC POLYPS 03/02/2009   EYE SURGERY, HX OF 09/05/2007   HEMORRHOIDS, HX OF    HYPERLIPIDEMIA 09/05/2007   HYPERTENSION 07/25/2007   OSTEOARTHRITIS 09/05/2007   SLEEP APNEA, OBSTRUCTIVE 09/05/2007    ALLERGIES:  has No Known Allergies.  MEDICATIONS:  Current Outpatient Medications  Medication Sig Dispense Refill   Artificial Tear Ointment (ARTIFICIAL TEARS) ointment Place into both eyes nightly.     aspirin 81 MG EC tablet Take 81 mg by mouth daily. Swallow whole.     Cholecalciferol (VITAMIN D) 2000 units tablet Take 2,000 Units by mouth daily.     COVID-19 mRNA vaccine 2023-2024 (COMIRNATY) syringe Inject into the muscle. 0.3 mL 0   Ensure (ENSURE) Take 237 mLs by mouth.     furosemide (LASIX) 20 MG tablet TAKE 1 TABLET BY MOUTH EVERY DAY 90 tablet 1   Multiple Vitamins-Minerals (MULTIVITAMIN ADULT) CHEW Chew by mouth.     No current facility-administered medications for this visit.    SURGICAL HISTORY:  Past Surgical History:  Procedure Laterality Date   EYE SURGERY  10/22/13   left eye, cataract    REVIEW OF SYSTEMS:  A  comprehensive review of systems was negative except for: Constitutional: positive for fatigue   PHYSICAL EXAMINATION: General appearance: alert, cooperative, appears stated age, fatigued, and no distress Head: Normocephalic, without obvious abnormality, atraumatic Neck: no adenopathy, no JVD, supple, symmetrical, trachea midline, and thyroid not enlarged, symmetric, no tenderness/mass/nodules Lymph nodes: Cervical, supraclavicular, and axillary nodes normal. Resp: clear to auscultation bilaterally Back: symmetric, no curvature. ROM normal. No CVA tenderness. Cardio: regular rate and rhythm, S1, S2 normal, no murmur, click, rub or gallop GI: soft, non-tender; bowel sounds normal; no masses,  no organomegaly Extremities: extremities normal, atraumatic, no cyanosis or edema  ECOG PERFORMANCE STATUS: 1 - Symptomatic but completely ambulatory  Blood pressure (!) 118/52, pulse 70, temperature 97.8 F (36.6 C), temperature source Oral, resp. rate 16, weight 133 lb 12.8 oz (60.7 kg), SpO2 98 %.  LABORATORY DATA: Lab Results  Component Value Date   WBC 38.7 (H) 01/27/2023   HGB 12.5 (L) 01/27/2023   HCT 38.1 (L) 01/27/2023   MCV 97.4 01/27/2023   PLT 141 (L) 01/27/2023      Chemistry      Component Value Date/Time   NA 138 07/24/2022 1340   NA 141 06/17/2017 1259   K 4.1 07/24/2022 1340   K 4.5 06/17/2017 1259   CL 103 07/24/2022 1340   CL 104 12/14/2012 1031   CO2 29 07/24/2022 1340   CO2 30 (H) 06/17/2017 1259  BUN 25 (H) 07/24/2022 1340   BUN 16.4 06/17/2017 1259   CREATININE 0.82 07/24/2022 1340   CREATININE 1.1 06/17/2017 1259      Component Value Date/Time   CALCIUM 9.0 07/24/2022 1340   CALCIUM 9.8 06/17/2017 1259   ALKPHOS 80 07/24/2022 1340   ALKPHOS 75 06/17/2017 1259   AST 20 07/24/2022 1340   AST 29 06/17/2017 1259   ALT 16 07/24/2022 1340   ALT 27 06/17/2017 1259   BILITOT 0.9 07/24/2022 1340   BILITOT 2.19 (H) 06/17/2017 1259       RADIOGRAPHIC  STUDIES: No results found.  ASSESSMENT AND PLAN:  This is a very pleasant 89 years old white male with a stage 0 chronic lymphocytic leukemia.  He is currently on observation.  The patient is doing fine today with no concerning complaints except for the mild fatigue. Repeat CBC today showed elevated white blood count of 38.7.  Hemoglobin was 12.5 and platelets 38.1% with platelet count of 141,000. I recommended for the patient to continue on observation with repeat blood work and 6 months. He was advised to call immediately if he has any concerning symptoms in the interval. All questions were answered. The patient knows to call the clinic with any problems, questions or concerns. We can certainly see the patient much sooner if necessary.  Disclaimer: This note was dictated with voice recognition software. Similar sounding words can inadvertently be transcribed and may not be corrected upon review.

## 2023-01-28 LAB — FERRITIN: Ferritin: 62 ng/mL (ref 24–336)

## 2023-02-05 ENCOUNTER — Ambulatory Visit (INDEPENDENT_AMBULATORY_CARE_PROVIDER_SITE_OTHER): Payer: Medicare Other | Admitting: Internal Medicine

## 2023-02-05 ENCOUNTER — Encounter: Payer: Self-pay | Admitting: Internal Medicine

## 2023-02-05 VITALS — BP 118/64 | HR 73 | Temp 98.2°F | Ht 67.0 in

## 2023-02-05 DIAGNOSIS — R29898 Other symptoms and signs involving the musculoskeletal system: Secondary | ICD-10-CM

## 2023-02-05 DIAGNOSIS — R202 Paresthesia of skin: Secondary | ICD-10-CM | POA: Diagnosis not present

## 2023-02-05 DIAGNOSIS — C9110 Chronic lymphocytic leukemia of B-cell type not having achieved remission: Secondary | ICD-10-CM

## 2023-02-05 NOTE — Assessment & Plan Note (Signed)
Progressing B UE weakness; r/o demyelinating process - Neurology ref  Progressing B UE weakness; r/o demyelinating process - Neurology ref May have started after COVID booster in the Fall of 2023

## 2023-02-05 NOTE — Assessment & Plan Note (Signed)
Progressing B LE weakness; r/o demyelinating process - Neurology ref

## 2023-02-05 NOTE — Progress Notes (Signed)
Subjective:  Patient ID: Jerry Gomez, male    DOB: 11-30-1931  Age: 89 y.o. MRN: [id number]  CC: Follow-up (Weakness in right hand , can't write and hard time eating )   HPI Jerry Gomez presents for R>L hand weakness: Progressing B UE weakness for 2- 3 months. It may have started after a COVID booster in the Fall of 2023.  He had problems with leg weakness and numbness in the past on both sides.  Outpatient Medications Prior to Visit  Medication Sig Dispense Refill   Artificial Tear Ointment (ARTIFICIAL TEARS) ointment Place into both eyes nightly.     aspirin 81 MG EC tablet Take 81 mg by mouth daily. Swallow whole.     Cholecalciferol (VITAMIN D) 2000 units tablet Take 2,000 Units by mouth daily.     COVID-19 mRNA vaccine 2023-2024 (COMIRNATY) syringe Inject into the muscle. 0.3 mL 0   Ensure (ENSURE) Take 237 mLs by mouth.     furosemide (LASIX) 20 MG tablet TAKE 1 TABLET BY MOUTH EVERY DAY 90 tablet 1   Multiple Vitamins-Minerals (MULTIVITAMIN ADULT) CHEW Chew by mouth.     No facility-administered medications prior to visit.    ROS: Review of Systems  Constitutional:  Negative for appetite change, fatigue and unexpected weight change.  HENT:  Negative for congestion, nosebleeds, sneezing, sore throat and trouble swallowing.   Eyes:  Negative for itching and visual disturbance.  Respiratory:  Negative for cough.   Cardiovascular:  Negative for chest pain, palpitations and leg swelling.  Gastrointestinal:  Negative for abdominal distention, blood in stool, diarrhea and nausea.  Genitourinary:  Negative for frequency and hematuria.  Musculoskeletal:  Positive for back pain, gait problem and myalgias. Negative for joint swelling and neck pain.  Skin:  Negative for rash.  Neurological:  Positive for weakness. Negative for dizziness, tremors and speech difficulty.  Hematological:  Bruises/bleeds easily.  Psychiatric/Behavioral:  Negative for agitation, behavioral problems,  dysphoric mood, sleep disturbance and suicidal ideas. The patient is not nervous/anxious.     Objective:  BP 118/64 (BP Location: Right Arm, Patient Position: Sitting, Cuff Size: Normal)   Pulse 73   Temp 98.2 F (36.8 C) (Oral)   Ht '5\' 7"'$  (1.702 m)   SpO2 97%   BMI 20.96 kg/m   BP Readings from Last 3 Encounters:  02/05/23 118/64  01/27/23 (!) 118/52  10/23/22 116/62    Wt Readings from Last 3 Encounters:  01/27/23 133 lb 12.8 oz (60.7 kg)  10/23/22 127 lb (57.6 kg)  08/19/22 130 lb (59 kg)    Physical Exam Constitutional:      General: He is not in acute distress.    Appearance: Normal appearance. He is well-developed.     Comments: NAD  Eyes:     Conjunctiva/sclera: Conjunctivae normal.     Pupils: Pupils are equal, round, and reactive to light.  Neck:     Thyroid: No thyromegaly.     Vascular: No JVD.  Cardiovascular:     Rate and Rhythm: Normal rate and regular rhythm.     Heart sounds: Normal heart sounds. No murmur heard.    No friction rub. No gallop.  Pulmonary:     Effort: Pulmonary effort is normal. No respiratory distress.     Breath sounds: Normal breath sounds. No wheezing or rales.  Chest:     Chest wall: No tenderness.  Abdominal:     General: Bowel sounds are normal. There is no distension.  Palpations: Abdomen is soft. There is no mass.     Tenderness: There is no abdominal tenderness. There is no guarding or rebound.  Musculoskeletal:        General: No tenderness. Normal range of motion.     Cervical back: Normal range of motion.     Right lower leg: No edema.     Left lower leg: No edema.  Lymphadenopathy:     Cervical: No cervical adenopathy.  Skin:    General: Skin is warm and dry.     Findings: No rash.  Neurological:     Mental Status: He is alert and oriented to person, place, and time.     Cranial Nerves: No cranial nerve deficit.     Sensory: No sensory deficit.     Motor: Weakness present. No abnormal muscle tone.      Coordination: Coordination abnormal.     Gait: Gait abnormal.     Deep Tendon Reflexes: Reflexes abnormal.  Psychiatric:        Mood and Affect: Mood normal.        Behavior: Behavior normal.        Thought Content: Thought content normal.        Judgment: Judgment normal.   He is in the wheelchair. Right grip is 4+, left grip is 5 - Deep tendon reflexes are absent Hand muscles with atrophy more on the right side.  No tremor or quivering    A total time of 45 minutes was spent preparing to see the patient, reviewing tests, x-rays, operative reports and other medical records.  Also, obtaining history and performing comprehensive physical exam.  Additionally, counseling the patient and his wife regarding the above listed issues -possible neurologic disease.   Finally, documenting clinical information in the health records, coordination of care. It is a complex case.   Lab Results  Component Value Date   WBC 38.7 (H) 01/27/2023   HGB 12.5 (L) 01/27/2023   HCT 38.1 (L) 01/27/2023   PLT 141 (L) 01/27/2023   GLUCOSE 91 01/27/2023   CHOL 119 08/14/2021   TRIG 57.0 08/14/2021   HDL 53.30 08/14/2021   LDLCALC 55 08/14/2021   ALT 11 01/27/2023   AST 18 01/27/2023   NA 139 01/27/2023   K 4.0 01/27/2023   CL 103 01/27/2023   CREATININE 0.64 01/27/2023   BUN 18 01/27/2023   CO2 31 01/27/2023   TSH 1.33 04/30/2022   PSA 8.51 (H) 08/14/2021    DG Cervical Spine Complete  Result Date: 10/26/2022 CLINICAL DATA:  Status post fall EXAM: CERVICAL SPINE - COMPLETE 4+ VIEW COMPARISON:  None Available. FINDINGS: Spinal alignment is normal. There is no acute fracture. There is moderate disc space narrowing at C3-C4, C4-C5 and C6-C7. There is severe disc space narrowing at C5-C6. Endplate osteophytes are seen throughout. There is facet arthropathy C at C2-C3, C3-C4 and C4-C5 causing some mild bilateral neural foraminal stenosis. C1-C2 intervals within normal limits on the open-mouth view. There is  no prevertebral soft tissue swelling. IMPRESSION: 1. No acute fracture or malalignment. 2. Moderate to severe multilevel degenerative changes as described. Electronically Signed   By: Ronney Asters M.D.   On: 10/26/2022 19:38   CT HEAD WO CONTRAST (5MM)  Result Date: 10/24/2022 CLINICAL DATA:  Head trauma, fell backwards last night striking posterior head, no loss of consciousness, mild headache EXAM: CT HEAD WITHOUT CONTRAST TECHNIQUE: Contiguous axial images were obtained from the base of the skull through the vertex without  intravenous contrast. RADIATION DOSE REDUCTION: This exam was performed according to the departmental dose-optimization program which includes automated exposure control, adjustment of the mA and/or kV according to patient size and/or use of iterative reconstruction technique. COMPARISON:  None Available. FINDINGS: Brain: Generalized atrophy. Normal ventricular morphology. No midline shift or mass effect. Small vessel chronic ischemic changes of deep cerebral white matter. No intracranial hemorrhage, mass lesion, evidence of acute infarction, or extra-axial fluid collection. Vascular: No hyperdense vessels. Atherosclerotic calcifications of internal carotid arteries at skull base Skull: Intact Sinuses/Orbits: Clear Other: N/A IMPRESSION: Atrophy with small vessel chronic ischemic changes of deep cerebral white matter. No acute intracranial abnormalities. Electronically Signed   By: Lavonia Dana M.D.   On: 10/24/2022 15:26    Assessment & Plan:   Problem List Items Addressed This Visit       Other   Weakness of both arms - Primary    Progressing B UE weakness; r/o demyelinating process - Neurology ref  Progressing B UE weakness; r/o demyelinating process - Neurology ref May have started after COVID booster in the Fall of 2023      Relevant Orders   Ambulatory referral to Neurology   Sedimentation rate   Vitamin B12   Protein electrophoresis, serum   CK   Paresthesia     Chronic.  Obtain SPEP, CK, B12, sed rate      Relevant Orders   Vitamin B12   Leg weakness, bilateral    Progressing B LE weakness; r/o demyelinating process - Neurology ref       CLL (chronic lymphocytic leukemia) ([place])    Seems stable.  Continue to monitor CBC         No orders of the defined types were placed in this encounter.     Follow-up: Return in about 2 months (around 04/06/2023) for a follow-up visit.  Walker Kehr, MD

## 2023-02-05 NOTE — Assessment & Plan Note (Signed)
Chronic.  Obtain SPEP, CK, B12, sed rate

## 2023-02-05 NOTE — Patient Instructions (Signed)
Brix [facility]

## 2023-02-05 NOTE — Assessment & Plan Note (Signed)
Seems stable.  Continue to monitor CBC

## 2023-03-21 IMAGING — MR MR HEAD W/O CM
10 series · 48 of 48 positions shown · non-contrast
Comparison: None.

CLINICAL DATA: Neuro deficit, acute, stroke suspected. Right
greater than leg weakness.

EXAM:
MRI HEAD WITHOUT CONTRAST
TECHNIQUE: Multiplanar, multiecho pulse sequences of the brain and surrounding
structures were obtained without intravenous contrast.

[Series 5: DWI · axial · 3.0mm · 1.36mm/px · z∈[-1,+134]mm · 9 of 104 slices shown (1 of 2)]
[im 1/104]
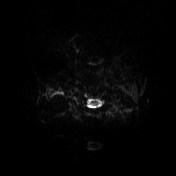
[im 13/104]
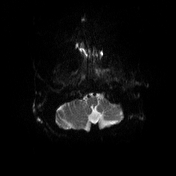
[im 26/104]
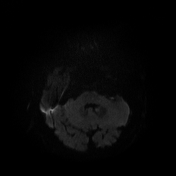
[im 39/104]
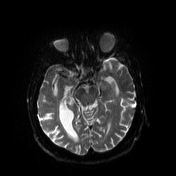
[im 52/104]
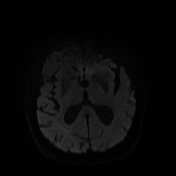
[im 65/104]
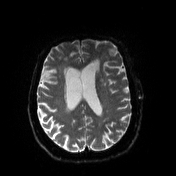
[im 78/104]
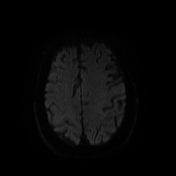
[im 91/104]
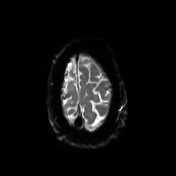
[im 104/104]
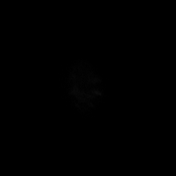

[Series 6: DWI · axial · 3.0mm · 1.36mm/px · z∈[-1,+134]mm · 4 of 52 slices shown (2 of 2)]
[im 1/52]
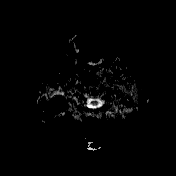
[im 18/52]
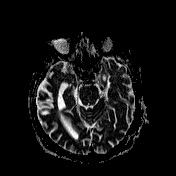
[im 35/52]
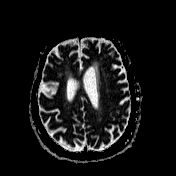
[im 52/52]
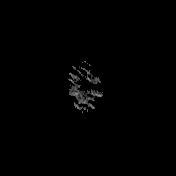

[Series 7: T1 · sagittal · 5.0mm · 0.75mm/px · 2 of 24 slices shown (1 of 2)]
[im 1/24]
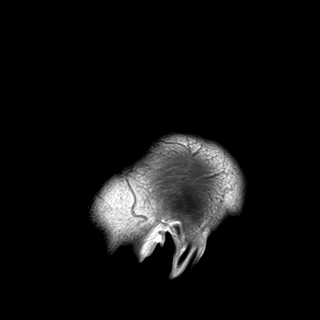
[im 24/24]
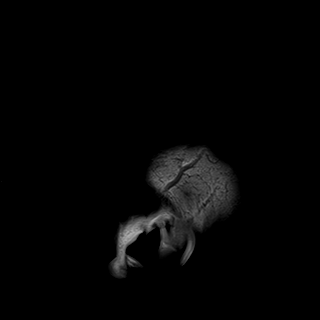

[Series 8: T2 · axial · 5.0mm · 0.62mm/px · z∈[-9,+136]mm · 2 of 26 slices shown (1 of 2)]
[im 1/26]
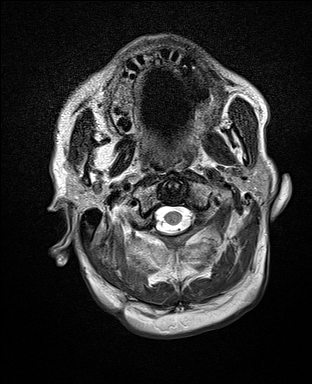
[im 26/26]
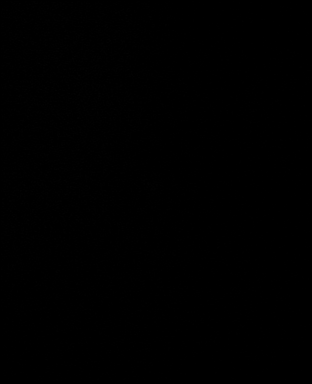

[Series 9: swi_images · axial · 3.0mm · 0.75mm/px · z∈[-10,+137]mm · 5 of 56 slices shown]
[im 1/56]
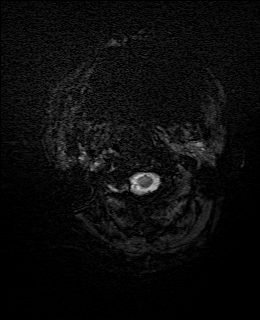
[im 14/56]
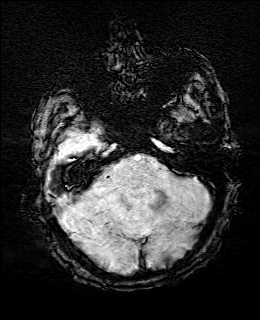
[im 28/56]
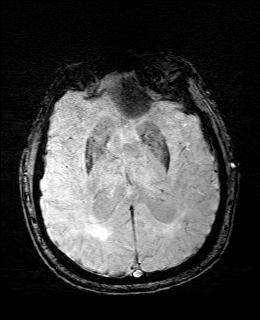
[im 42/56]
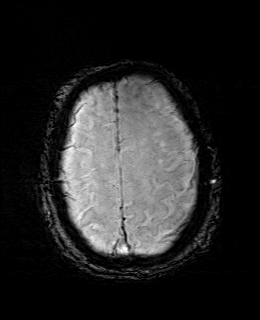
[im 56/56]
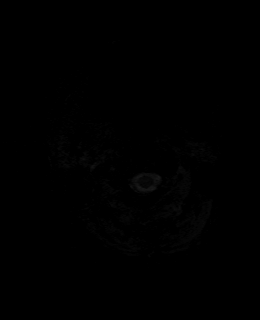

[Series 11: FLAIR · axial · 3.0mm · 0.75mm/px · z∈[-4,+131]mm · 4 of 52 slices shown]
[im 1/52]
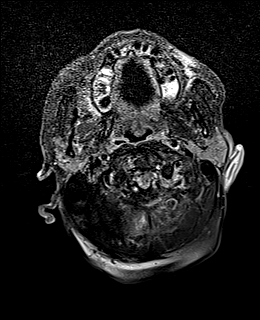
[im 18/52]
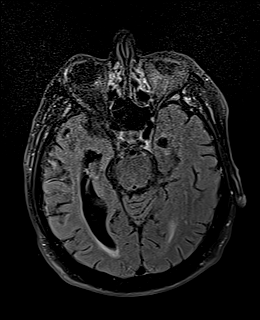
[im 35/52]
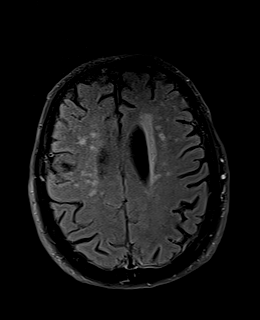
[im 52/52]
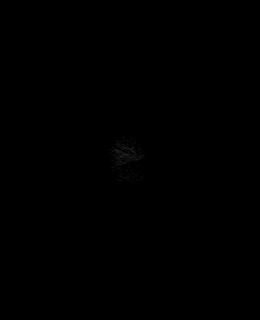

[Series 12: T1 · axial · 1.0mm · 0.94mm/px · z∈[-7,+134]mm · 13 of 160 slices shown (2 of 2)]
[im 1/160]
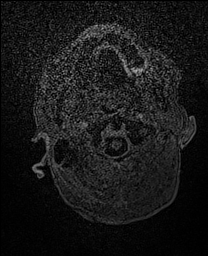
[im 14/160]
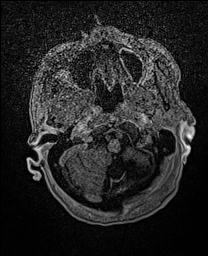
[im 27/160]
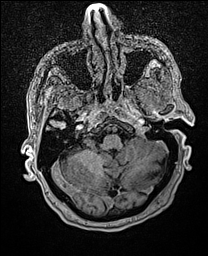
[im 40/160]
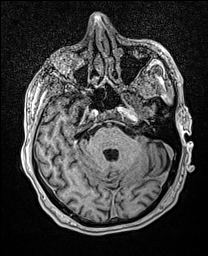
[im 54/160]
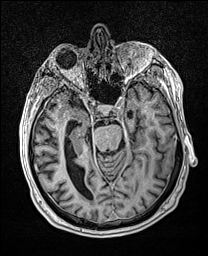
[im 67/160]
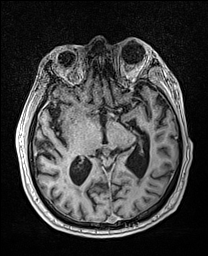
[im 80/160]
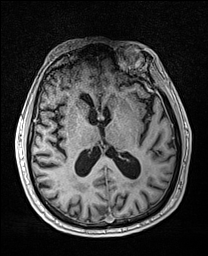
[im 93/160]
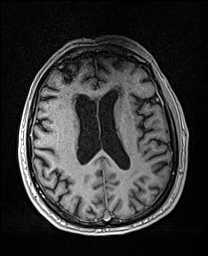
[im 107/160]
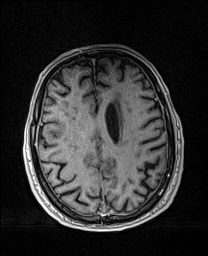
[im 120/160]
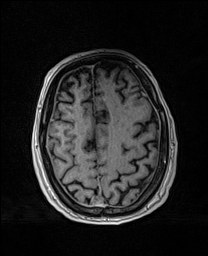
[im 133/160]
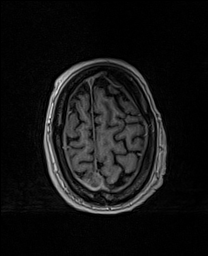
[im 146/160]
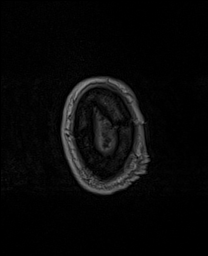
[im 160/160]
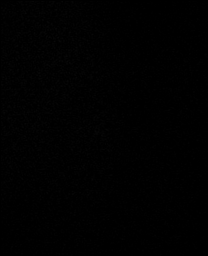

[Series 13: cor dwi_tracew · coronal · 5.0mm · 1.53mm/px · 5 of 56 slices shown]
[im 1/56]
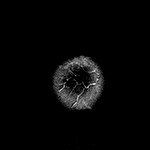
[im 14/56]
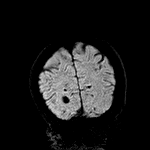
[im 28/56]
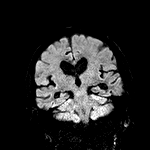
[im 42/56]
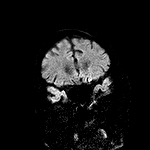
[im 56/56]
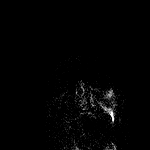

[Series 14: cor dwi_adc · coronal · 5.0mm · 1.53mm/px · 2 of 28 slices shown]
[im 1/28]
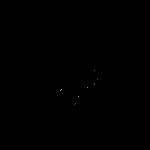
[im 28/28]
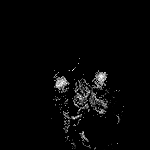

[Series 15: T2 · coronal · 5.0mm · 0.57mm/px · 2 of 28 slices shown (2 of 2)]
[im 1/28]
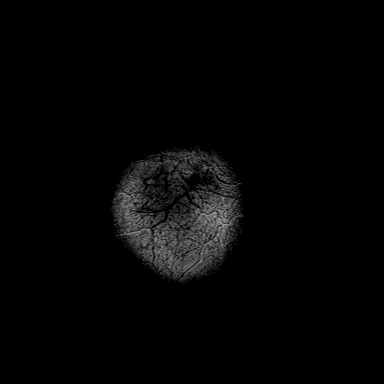
[im 28/28]
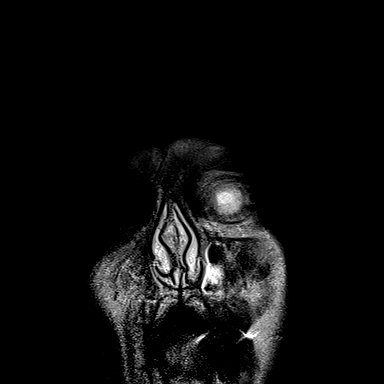

[48 of 48 positions shown; findings below may reference images not displayed]

FINDINGS: There is residual contrast material from today's earlier lumbar
spine MRI.

Brain: There is no evidence of an acute infarct, intracranial
hemorrhage, mass, midline shift, or extra-axial fluid collection. T2
hyperintensities in the cerebral white matter bilaterally are
nonspecific but compatible with mild-to-moderate chronic small
vessel ischemic disease. There is moderate cerebral atrophy.

Vascular: Major intracranial vascular flow voids are preserved.

Skull and upper cervical spine: Unremarkable bone marrow signal para

Sinuses/Orbits: Bilateral cataract extraction. Mild mucosal
thickening in the paranasal sinuses. Clear mastoid air cells.

Other: None.
IMPRESSION: 1. No acute intracranial abnormality.
2. Mild-to-moderate chronic small vessel ischemic disease and
cerebral atrophy.

## 2023-03-21 IMAGING — MR MR LUMBAR SPINE WO/W CM
4 of 7 series · 17 of 48 positions shown · IV contrast (6ml GADAVIST)
Comparison: Lumbar spine MRI 03/22/2021.

CLINICAL DATA: Sudden onset bilateral leg weakness

EXAM:
MRI LUMBAR SPINE WITHOUT AND WITH CONTRAST
TECHNIQUE: Multiplanar and multiecho pulse sequences of the lumbar spine were
obtained without and with intravenous contrast.
CONTRAST:  6mL GADAVIST GADOBUTROL 1 MMOL/ML IV SOLN

[Series 5: T1 · sagittal · 4.0mm · 0.53mm/px · 3 of 18 slices shown (1 of 2)]
[im 1/18]
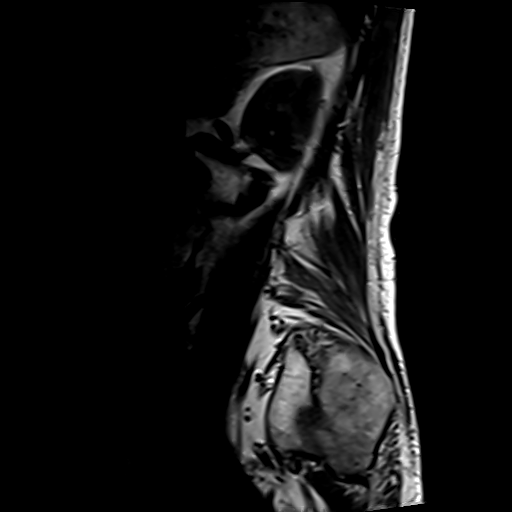
[im 9/18]
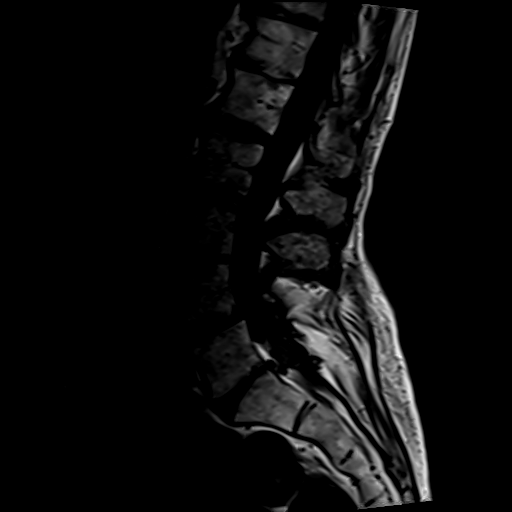
[im 18/18]
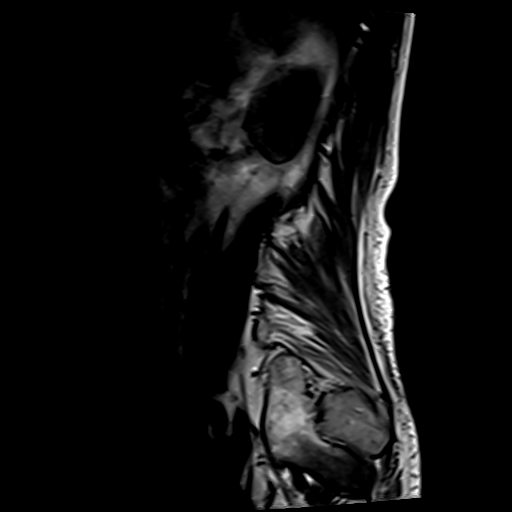

[Series 7: T2 · axial · 4.0mm · 0.39mm/px · z∈[+14,+240]mm · 8 of 47 slices shown]
[im 1/47]
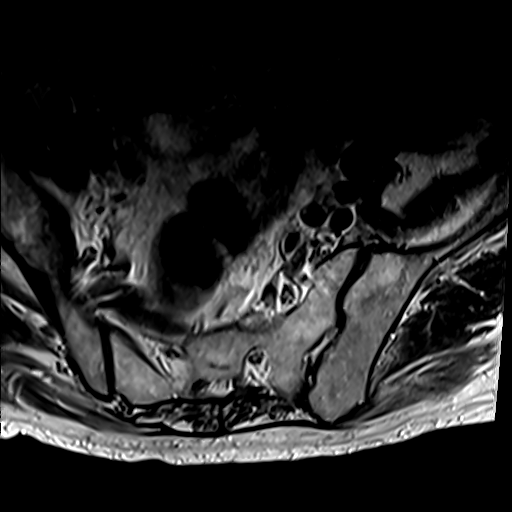
[im 5/47]
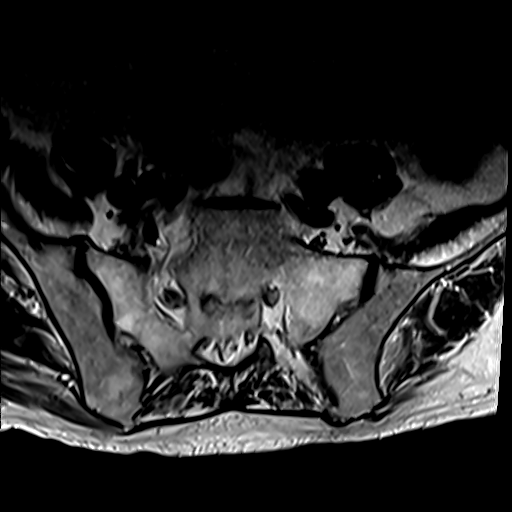
[im 10/47]
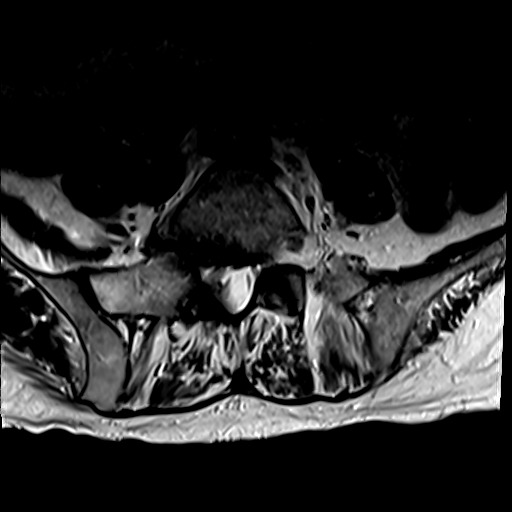
[im 14/47]
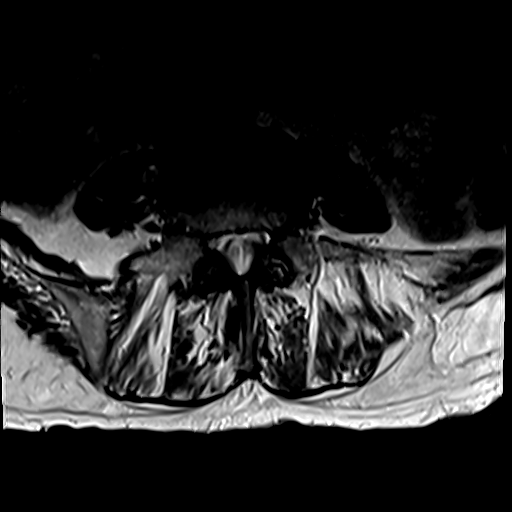
[im 19/47]
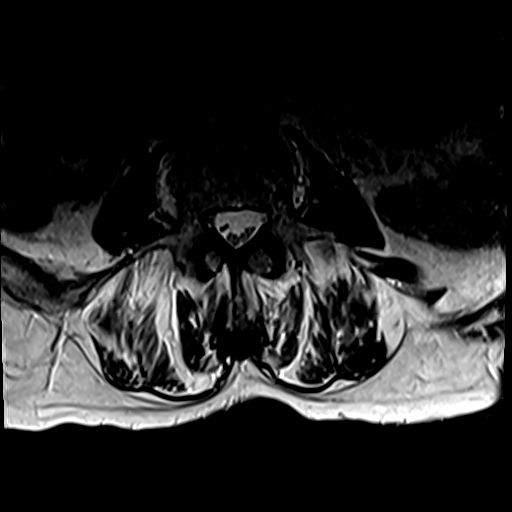
[im 24/47]
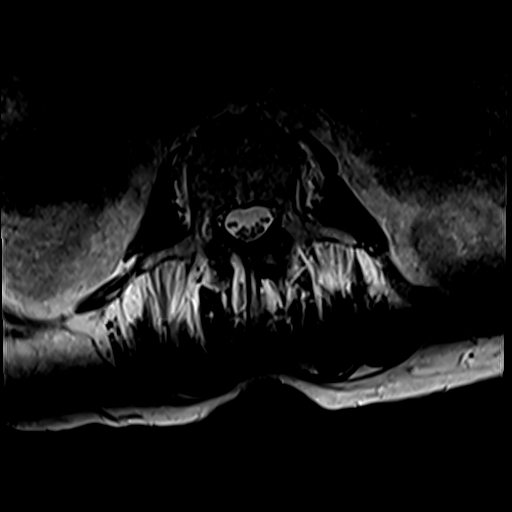
[im 28/47]
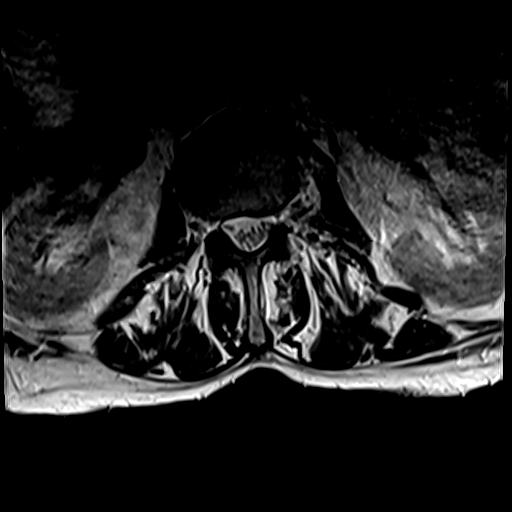
[im 42/47]
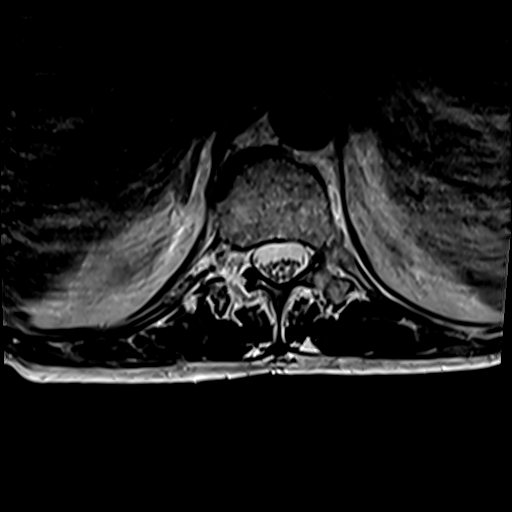

[Series 8: T1 · axial · 4.0mm · 0.39mm/px · z∈[+33,+240]mm · 3 of 47 slices shown (2 of 2)]
[im 5/47]
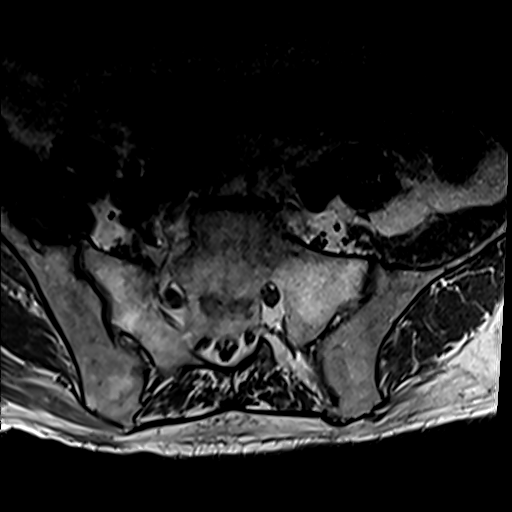
[im 24/47]
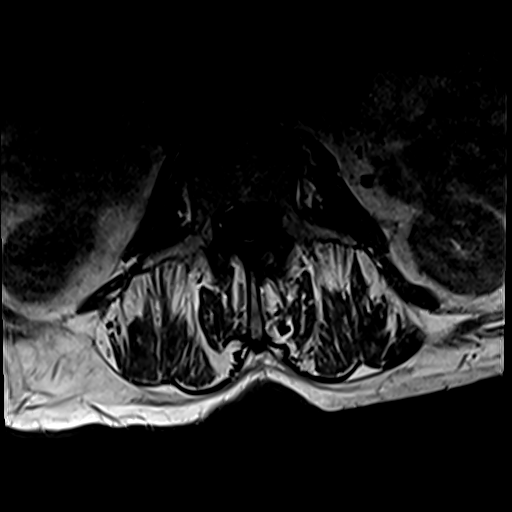
[im 42/47]
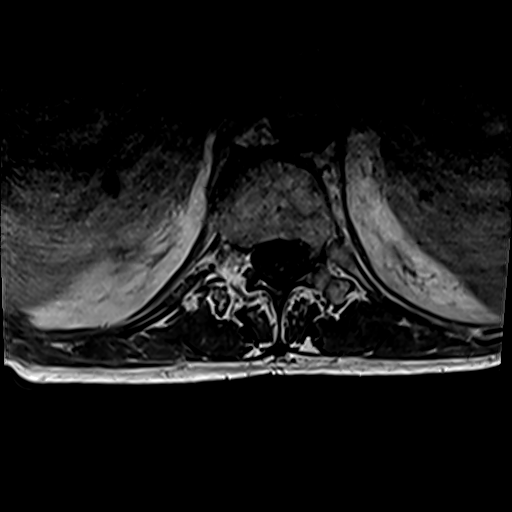

[Series 9: T2 post-contrast · sagittal · 4.0mm · 0.53mm/px · 3 of 18 slices shown]
[im 1/18]
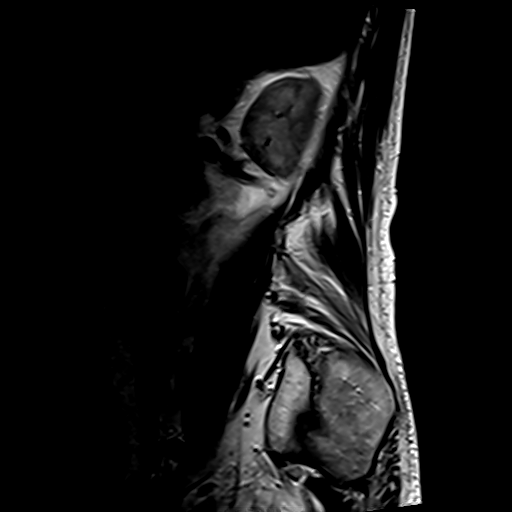
[im 12/18]
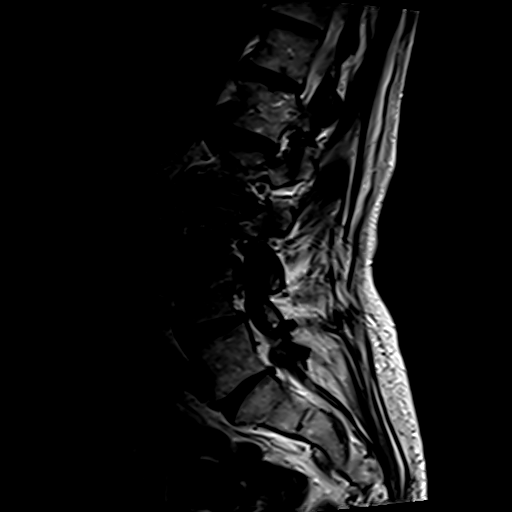
[im 18/18]
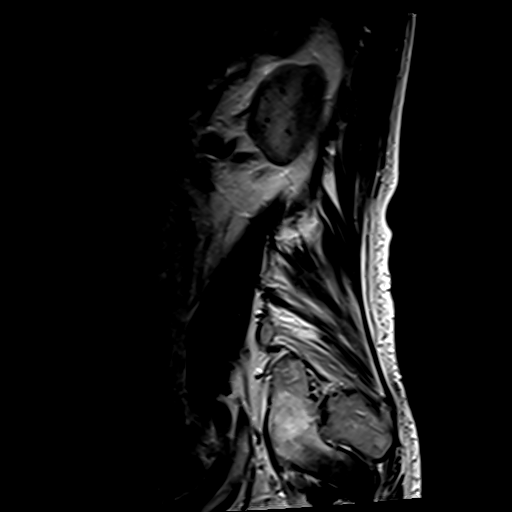

[17 of 48 positions shown; findings below may reference images not displayed]

FINDINGS: Segmentation: Standard; the lowest formed disc space is designated
L5-S1.

Alignment: There is grade 1 retrolisthesis of L2 on L3 and grade 1
anterolisthesis of L4 on L5, unchanged. There is slight
dextrocurvature centered at L3-L4. Alignment is otherwise normal.

Vertebrae: Vertebral body heights are preserved. Marrow signal is
normal. There is no abnormal marrow edema or enhancement.

Conus medullaris and cauda equina: Conus extends to the T12-L1
level. Conus and cauda equina appear normal.

Paraspinal and other soft tissues: A lobulated T2 hyperintense
lesion in the right kidney is unchanged, likely reflecting a cyst.
The paraspinal soft tissues are unremarkable.

Disc levels:

There is multilevel disc desiccation and narrowing, most advanced at
L3-L4 and L5-S1. There is multilevel facet arthropathy, most
advanced at L4-L5. There is an associated small effusion on the
left. Findings are not significantly changed

T12-L1: No significant spinal canal or neural foraminal stenosis

L1-L2: There is a mild disc bulge and left worse than right facet
arthropathy resulting in mild left and no significant right neural
foraminal stenosis and no significant spinal canal stenosis,
unchanged.

L2-L3: There is grade 1 retrolisthesis with a mild bulge and mild
bilateral facet arthropathy resulting in mild narrowing of the left
subarticular zone and mild left and no significant right neural
foraminal stenosis, not significantly changed.

L3-L4: There is a mild disc bulge and mild bilateral facet
arthropathy resulting in mild left and no significant right neural
foraminal stenosis and no significant spinal canal stenosis,
unchanged.

L4-L5: There is grade 1 anterolisthesis with a mild bulge,
ligamentum flavum thickening, and moderate bilateral facet
arthropathy resulting in moderate spinal canal stenosis with
crowding of the cauda equina nerve roots and subarticular zones with
possible impingement of the traversing right L4 nerve root (7-32),
and mild bilateral neural foraminal stenosis. Findings are
unchanged.

L5-S1: There is a mild disc bulge and left worse than right facet
arthropathy resulting in narrowing of the bilateral subarticular
zones, right more than left, and moderate right and no significant
left neural foraminal stenosis. Extraforaminal disc material again
contacts the exiting right L5 nerve root (9-5). Findings are
unchanged.
IMPRESSION: 1. Mild disc bulge, ligamentum flavum thickening, and bulky facet
arthropathy at L4-L5 resulting in moderate spinal canal stenosis
with crowding of the subarticular zones and cauda equina nerve roots
with possible impingement of the traversing right L4 nerve root, and
mild bilateral neural foraminal stenosis, unchanged since
03/22/2021.
[DATE]. Disc bulge and bilateral facet arthropathy at L5-S1 resulting in
narrowing of the bilateral subarticular zones, right worse than
left, and contact of the exiting right L5 nerve root by
extraforaminal disc material, also not significantly changed.
3. Mild degenerative changes at the remaining levels as detailed
above without other significant spinal canal or neural foraminal
stenosis, and no other evidence of nerve root impingement.

## 2023-03-25 ENCOUNTER — Other Ambulatory Visit (INDEPENDENT_AMBULATORY_CARE_PROVIDER_SITE_OTHER): Payer: Medicare Other

## 2023-03-25 DIAGNOSIS — R29898 Other symptoms and signs involving the musculoskeletal system: Secondary | ICD-10-CM | POA: Diagnosis not present

## 2023-03-25 DIAGNOSIS — R202 Paresthesia of skin: Secondary | ICD-10-CM | POA: Diagnosis not present

## 2023-03-25 LAB — VITAMIN B12: Vitamin B-12: 243 pg/mL (ref 211–911)

## 2023-03-25 LAB — CK: Total CK: 118 U/L (ref 7–232)

## 2023-03-25 LAB — SEDIMENTATION RATE: Sed Rate: 5 mm/hr (ref 0–20)

## 2023-04-03 ENCOUNTER — Encounter: Payer: Self-pay | Admitting: Internal Medicine

## 2023-04-03 ENCOUNTER — Ambulatory Visit (INDEPENDENT_AMBULATORY_CARE_PROVIDER_SITE_OTHER): Payer: Medicare Other | Admitting: Internal Medicine

## 2023-04-03 VITALS — BP 98/48 | HR 84 | Temp 98.0°F | Ht 67.0 in | Wt 132.0 lb

## 2023-04-03 DIAGNOSIS — C9110 Chronic lymphocytic leukemia of B-cell type not having achieved remission: Secondary | ICD-10-CM | POA: Diagnosis not present

## 2023-04-03 DIAGNOSIS — R29898 Other symptoms and signs involving the musculoskeletal system: Secondary | ICD-10-CM | POA: Diagnosis not present

## 2023-04-03 DIAGNOSIS — I10 Essential (primary) hypertension: Secondary | ICD-10-CM

## 2023-04-03 MED ORDER — MULTIVITAMIN ADULT PO CHEW: CHEWABLE_TABLET | ORAL | 3 refills | Status: AC

## 2023-04-03 MED ORDER — B COMPLEX PLUS PO TABS: 1.0000 | ORAL_TABLET | ORAL | 3 refills | Status: AC

## 2023-04-03 NOTE — Assessment & Plan Note (Signed)
Start Ensure plus to improve nutrition Use a Rollator walker, w/c

## 2023-04-03 NOTE — Assessment & Plan Note (Addendum)
BP Readings from Last 3 Encounters:  04/03/23 (!) 98/48  02/05/23 118/64  01/27/23 (!) 118/52  NAS diet

## 2023-04-03 NOTE — Progress Notes (Signed)
Subjective:  Patient ID: Jerry Gomez, male    DOB: 1931-06-06  Age: 89 y.o. MRN: [id number]  CC: No chief complaint on file.   HPI Jerry Gomez presents for CLL, OA, R hand weakness - no worse  Outpatient Medications Prior to Visit  Medication Sig Dispense Refill   Artificial Tear Ointment (ARTIFICIAL TEARS) ointment Place into both eyes nightly.     aspirin 81 MG EC tablet Take 81 mg by mouth daily. Swallow whole.     Cholecalciferol (VITAMIN D) 2000 units tablet Take 2,000 Units by mouth daily.     COVID-19 mRNA vaccine 2023-2024 (COMIRNATY) syringe Inject into the muscle. 0.3 mL 0   Ensure (ENSURE) Take 237 mLs by mouth.     furosemide (LASIX) 20 MG tablet TAKE 1 TABLET BY MOUTH EVERY DAY 90 tablet 1   Multiple Vitamins-Minerals (MULTIVITAMIN ADULT) CHEW Chew by mouth.     No facility-administered medications prior to visit.    ROS: Review of Systems  Constitutional:  Negative for appetite change, fatigue and unexpected weight change.  HENT:  Negative for congestion, nosebleeds, sneezing, sore throat and trouble swallowing.   Eyes:  Negative for itching and visual disturbance.  Respiratory:  Negative for cough.   Cardiovascular:  Negative for chest pain, palpitations and leg swelling.  Gastrointestinal:  Negative for abdominal distention, blood in stool, diarrhea and nausea.  Genitourinary:  Negative for frequency and hematuria.  Musculoskeletal:  Positive for arthralgias and gait problem. Negative for back pain, joint swelling and neck pain.  Skin:  Negative for rash.  Neurological:  Positive for weakness. Negative for dizziness, tremors and speech difficulty.  Psychiatric/Behavioral:  Negative for agitation, dysphoric mood and sleep disturbance. The patient is not nervous/anxious.     Objective:  BP (!) 98/48 (BP Location: Left Arm, Patient Position: Sitting, Cuff Size: Normal)   Pulse 84   Temp 98 F (36.7 C) (Oral)   Ht  (1.702 m)   Wt 132 lb (59.9 kg)    SpO2 97%   BMI 20.67 kg/m   BP Readings from Last 3 Encounters:  04/03/23 (!) 98/48  02/05/23 118/64  01/27/23 (!) 118/52    Wt Readings from Last 3 Encounters:  04/03/23 132 lb (59.9 kg)  01/27/23 133 lb 12.8 oz (60.7 kg)  10/23/22 127 lb (57.6 kg)    Physical Exam Constitutional:      General: He is not in acute distress.    Appearance: Normal appearance. He is well-developed.     Comments: NAD  Eyes:     Conjunctiva/sclera: Conjunctivae normal.     Pupils: Pupils are equal, round, and reactive to light.  Neck:     Thyroid: No thyromegaly.     Vascular: No JVD.  Cardiovascular:     Rate and Rhythm: Normal rate and regular rhythm.     Heart sounds: Normal heart sounds. No murmur heard.    No friction rub. No gallop.  Pulmonary:     Effort: Pulmonary effort is normal. No respiratory distress.     Breath sounds: Normal breath sounds. No wheezing or rales.  Chest:     Chest wall: No tenderness.  Abdominal:     General: Bowel sounds are normal. There is no distension.     Palpations: Abdomen is soft. There is no mass.     Tenderness: There is no abdominal tenderness. There is no guarding or rebound.  Musculoskeletal:        General: No tenderness. Normal  range of motion.     Cervical back: Normal range of motion.     Right lower leg: No edema.     Left lower leg: No edema.  Lymphadenopathy:     Cervical: No cervical adenopathy.  Skin:    General: Skin is warm and dry.     Findings: No rash.  Neurological:     Mental Status: He is alert and oriented to person, place, and time.     Cranial Nerves: No cranial nerve deficit.     Motor: No abnormal muscle tone.     Coordination: Coordination abnormal.     Gait: Gait abnormal.     Deep Tendon Reflexes: Reflexes are normal and symmetric.  Psychiatric:        Behavior: Behavior normal.        Thought Content: Thought content normal.        Judgment: Judgment normal.   In a w/c Stiff hips R grip - weaker  Lab  Results  Component Value Date   WBC 38.7 (H) 01/27/2023   HGB 12.5 (L) 01/27/2023   HCT 38.1 (L) 01/27/2023   PLT 141 (L) 01/27/2023   GLUCOSE 91 01/27/2023   CHOL 119 08/14/2021   TRIG 57.0 08/14/2021   HDL 53.30 08/14/2021   LDLCALC 55 08/14/2021   ALT 11 01/27/2023   AST 18 01/27/2023   NA 139 01/27/2023   K 4.0 01/27/2023   CL 103 01/27/2023   CREATININE 0.64 01/27/2023   BUN 18 01/27/2023   CO2 31 01/27/2023   TSH 1.33 04/30/2022   PSA 8.51 (H) 08/14/2021    DG Cervical Spine Complete  Result Date: 10/26/2022 CLINICAL DATA:  Status post fall EXAM: CERVICAL SPINE - COMPLETE 4+ VIEW COMPARISON:  None Available. FINDINGS: Spinal alignment is normal. There is no acute fracture. There is moderate disc space narrowing at C3-C4, C4-C5 and C6-C7. There is severe disc space narrowing at C5-C6. Endplate osteophytes are seen throughout. There is facet arthropathy C at C2-C3, C3-C4 and C4-C5 causing some mild bilateral neural foraminal stenosis. C1-C2 intervals within normal limits on the open-mouth view. There is no prevertebral soft tissue swelling. IMPRESSION: 1. No acute fracture or malalignment. 2. Moderate to severe multilevel degenerative changes as described. Electronically Signed   By: Darliss Cheney M.D.   On: 10/26/2022 19:38   CT HEAD WO CONTRAST ( )  Result Date: 10/24/2022 CLINICAL DATA:  Head trauma, fell backwards last night striking posterior head, no loss of consciousness, mild headache EXAM: CT HEAD WITHOUT CONTRAST TECHNIQUE: Contiguous axial images were obtained from the base of the skull through the vertex without intravenous contrast. RADIATION DOSE REDUCTION: This exam was performed according to the departmental dose-optimization program which includes automated exposure control, adjustment of the mA and/or kV according to patient size and/or use of iterative reconstruction technique. COMPARISON:  None Available. FINDINGS: Brain: Generalized atrophy. Normal  ventricular morphology. No midline shift or mass effect. Small vessel chronic ischemic changes of deep cerebral white matter. No intracranial hemorrhage, mass lesion, evidence of acute infarction, or extra-axial fluid collection. Vascular: No hyperdense vessels. Atherosclerotic calcifications of internal carotid arteries at skull base Skull: Intact Sinuses/Orbits: Clear Other: N/A IMPRESSION: Atrophy with small vessel chronic ischemic changes of deep cerebral white matter. No acute intracranial abnormalities. Electronically Signed   By: Ulyses Southward M.D.   On: 10/24/2022 15:26    Assessment & Plan:   Problem List Items Addressed This Visit     Essential hypertension  BP Readings from Last 3 Encounters:  04/03/23 (!) 98/48  02/05/23 118/64  01/27/23 (!) 118/52  NAS diet       CLL (chronic lymphocytic leukemia) - Primary    Follow-up very 6 months w/Dr. Arbutus Ped at [facility] Monitor CBC/WBC      Leg weakness, bilateral    Start Ensure plus to improve nutrition Use a Rollator walker, w/c         Meds ordered this encounter  Medications   B Complex-Folic Acid (B COMPLEX PLUS) TABS    Sig: Take 1 tablet by mouth every other day.    Dispense:  100 tablet    Refill:  3   Multiple Vitamins-Minerals (MULTIVITAMIN ADULT) CHEW    Sig: Take one qod    Dispense:  100 tablet    Refill:  3      Follow-up: No follow-ups on file.  Sonda Primes, MD

## 2023-04-03 NOTE — Assessment & Plan Note (Signed)
Follow-up very 6 months w/Dr. Arbutus Ped at [facility] Monitor CBC/WBC

## 2023-04-07 ENCOUNTER — Other Ambulatory Visit: Payer: Self-pay

## 2023-04-07 ENCOUNTER — Encounter (HOSPITAL_BASED_OUTPATIENT_CLINIC_OR_DEPARTMENT_OTHER): Payer: Self-pay | Admitting: Emergency Medicine

## 2023-04-07 ENCOUNTER — Emergency Department (HOSPITAL_BASED_OUTPATIENT_CLINIC_OR_DEPARTMENT_OTHER)
Admission: EM | Admit: 2023-04-07 | Discharge: 2023-04-07 | Disposition: A | Discharge status: Home / Self Care | Payer: Medicare Other | Attending: Emergency Medicine | Admitting: Emergency Medicine

## 2023-04-07 ENCOUNTER — Emergency Department (HOSPITAL_BASED_OUTPATIENT_CLINIC_OR_DEPARTMENT_OTHER): Payer: Medicare Other | Admitting: Radiology

## 2023-04-07 ENCOUNTER — Other Ambulatory Visit (HOSPITAL_BASED_OUTPATIENT_CLINIC_OR_DEPARTMENT_OTHER): Payer: Self-pay

## 2023-04-07 DIAGNOSIS — S61412A Laceration without foreign body of left hand, initial encounter: Secondary | ICD-10-CM | POA: Diagnosis not present

## 2023-04-07 DIAGNOSIS — W1830XA Fall on same level, unspecified, initial encounter: Secondary | ICD-10-CM | POA: Insufficient documentation

## 2023-04-07 DIAGNOSIS — Z7982 Long term (current) use of aspirin: Secondary | ICD-10-CM | POA: Diagnosis not present

## 2023-04-07 DIAGNOSIS — S61217A Laceration without foreign body of left little finger without damage to nail, initial encounter: Secondary | ICD-10-CM | POA: Diagnosis not present

## 2023-04-07 DIAGNOSIS — S51011A Laceration without foreign body of right elbow, initial encounter: Secondary | ICD-10-CM | POA: Insufficient documentation

## 2023-04-07 DIAGNOSIS — Z23 Encounter for immunization: Secondary | ICD-10-CM | POA: Diagnosis not present

## 2023-04-07 DIAGNOSIS — S51012A Laceration without foreign body of left elbow, initial encounter: Secondary | ICD-10-CM | POA: Diagnosis not present

## 2023-04-07 DIAGNOSIS — Z79899 Other long term (current) drug therapy: Secondary | ICD-10-CM | POA: Insufficient documentation

## 2023-04-07 DIAGNOSIS — I10 Essential (primary) hypertension: Secondary | ICD-10-CM | POA: Insufficient documentation

## 2023-04-07 DIAGNOSIS — S51019A Laceration without foreign body of unspecified elbow, initial encounter: Secondary | ICD-10-CM

## 2023-04-07 DIAGNOSIS — W19XXXA Unspecified fall, initial encounter: Secondary | ICD-10-CM

## 2023-04-07 DIAGNOSIS — M79632 Pain in left forearm: Secondary | ICD-10-CM | POA: Diagnosis not present

## 2023-04-07 MED ORDER — CEPHALEXIN 500 MG PO CAPS: 500.0000 mg | ORAL_CAPSULE | Freq: Four times a day (QID) | ORAL | 0 refills | Status: DC

## 2023-04-07 MED ORDER — LIDOCAINE HCL (PF) 1 % IJ SOLN
10.0000 mL | Freq: Once | INTRAMUSCULAR | Status: AC
  Administered 2023-04-07: 10 mL
  Filled 2023-04-07: qty 10

## 2023-04-07 MED ORDER — TETANUS-DIPHTH-ACELL PERTUSSIS 5-2.5-18.5 LF-MCG/0.5 IM SUSY
0.5000 mL | PREFILLED_SYRINGE | Freq: Once | INTRAMUSCULAR | Status: AC
  Administered 2023-04-07: 0.5 mL via INTRAMUSCULAR
  Filled 2023-04-07: qty 0.5

## 2023-04-07 MED ORDER — ACETAMINOPHEN 325 MG PO TABS
650.0000 mg | ORAL_TABLET | Freq: Once | ORAL | Status: AC
  Administered 2023-04-07: 650 mg via ORAL
  Filled 2023-04-07: qty 2

## 2023-04-07 NOTE — Discharge Instructions (Addendum)
Your finger was repaired with sutures.  Recommend cleaning of the wounds with warm soapy water daily, changing wound dressings daily.  Do the same with your skin tear of the elbow.  Watch for signs of infection which include redness, swelling, purulent drainage, worsening pain.  Signs of flexor tenosynovitis which is a surgical emergency of the finger include diffuse swelling of the finger, holding the finger in flexion, pain with any passive extension of the finger and tenderness at the base of the finger.  Antibiotics have been prescribed.  Your x-ray imaging did not reveal evidence of fracture.

## 2023-04-07 NOTE — ED Notes (Signed)
Suture cart, lac tray, xylocaine at bedside

## 2023-04-07 NOTE — ED Triage Notes (Signed)
Pt arrives to ED with c/o fall that happened last night while pt was undressing. He notes he fell backwards. He notes left arm, left 5th finger, and hand pain. He denies head injury and LOC.

## 2023-04-07 NOTE — ED Provider Notes (Signed)
[place] EMERGENCY DEPARTMENT AT [facility] Provider Note   CSN: [id number] Arrival date & time: 04/07/23  1038     History  Chief Complaint  Patient presents with   Jerry Gomez is a 89 y.o. male.   Fall     89 year old male with medical history significant for HTN, HLD, osteoarthritis who presents to the emergency department after a mechanical fall.  The patient states that he was leaning backwards and fell back onto carpet sustaining skin tears onto his left arm and hand.  He sustained a laceration to his left pinky.  His tetanus is not up-to-date.  He is not on anticoagulation, denies any head or neck trauma and denies any loss of consciousness.  He states that he primarily has had pain in the setting of a skin tear along his left forearm/elbow and pain in his left pinky.  He arrives to the emergency department GCS 15, ABC intact.  Home Medications Prior to Admission medications   Medication Sig Start Date End Date Taking? Authorizing Provider  cephALEXin (KEFLEX) 500 MG capsule Take 1 capsule (500 mg total) by mouth 4 (four) times daily. 04/07/23  Yes Ernie Avena, MD  Artificial Tear Ointment (ARTIFICIAL TEARS) ointment Place into both eyes nightly.    [provider]  aspirin 81 MG EC tablet Take 81 mg by mouth daily. Swallow whole.    [provider]  B Complex-Folic Acid (B COMPLEX PLUS) TABS Take 1 tablet by mouth every other day. 04/03/23   [name], Georgina Quint, MD  Cholecalciferol (VITAMIN D) 2000 units tablet Take 2,000 Units by mouth daily.    [provider]  COVID-19 mRNA vaccine [phone] (COMIRNATY) syringe Inject into the muscle. 10/11/22     Ensure (ENSURE) Take 237 mLs by mouth.    [provider]  furosemide (LASIX) 20 MG tablet TAKE 1 TABLET BY MOUTH EVERY DAY 07/18/22   [name], Georgina Quint, MD  Multiple Vitamins-Minerals (MULTIVITAMIN ADULT) CHEW Take one qod 04/03/23   [name], Georgina Quint, MD       Allergies    Patient has no known allergies.    Review of Systems   Review of Systems  All other systems reviewed and are negative.   Physical Exam Updated Vital Signs BP (!) 134/58   Pulse 69   Temp 97.9 F (36.6 C)   Resp 18   SpO2 98%  Physical Exam Vitals and nursing note reviewed.  Constitutional:      Appearance: He is well-developed.     Comments: GCS 15, ABC intact  HENT:     Head: Normocephalic.  Eyes:     Conjunctiva/sclera: Conjunctivae normal.  Neck:     Comments: No midline tenderness to palpation of the cervical spine. ROM intact. Cardiovascular:     Rate and Rhythm: Normal rate and regular rhythm.  Pulmonary:     Effort: Pulmonary effort is normal. No respiratory distress.     Breath sounds: Normal breath sounds.  Chest:     Comments: Chest wall stable and non-tender to AP and lateral compression. Clavicles stable and non-tender to AP compression Abdominal:     Palpations: Abdomen is soft.     Tenderness: There is no abdominal tenderness.     Comments: Pelvis stable to lateral compression.  Musculoskeletal:     Cervical back: Neck supple.     Comments: No midline tenderness to palpation of the thoracic or lumbar spine. Extremities atraumatic with intact ROM with  the exception of hematoma and skin tear to the left forearm and close to the left elbow, no tenderness of the medial or lateral epicondyle Lyles or olecranon, left pinky with bruising and tenderness to palpation, volar laceration noted along the fifth digit, hemostatic, distal left upper extremity neurovascular intact with intact motor function along the median, ulnar, radial nerve distributions, 2+ radial pulses  Skin:    General: Skin is warm and dry.  Neurological:     Mental Status: He is alert.     Comments: CN II-XII grossly intact. Moving all four extremities spontaneously and sensation grossly intact.     ED Results / Procedures / Treatments   Labs (all labs ordered are listed, but  only abnormal results are displayed) Labs Reviewed - No data to display  EKG None  Radiology DG Forearm Left  Result Date: 04/07/2023 CLINICAL DATA:  Fall with pain and bruising EXAM: LEFT FOREARM - 2 VIEW COMPARISON:  None Available. FINDINGS: There is no evidence of fracture or other focal bone lesions. Soft tissues are unremarkable. IMPRESSION: Negative. Electronically Signed   By: Paulina Fusi M.D.   On: 04/07/2023 12:24   DG Hand Complete Left  Result Date: 04/07/2023 CLINICAL DATA:  Fall with laceration to the fifth digit EXAM: LEFT HAND - COMPLETE 3+ VIEW COMPARISON:  None Available. FINDINGS: No evidence of acute fracture or dislocation. Small periarticular calcifications adjacent to the inter phalangeal joints of the small finger are probably chronic rather than indicative of an acute fracture. No other significant finding in the region. IMPRESSION: No acute or traumatic finding. Small periarticular calcifications adjacent to the interphalangeal joints of the small finger, probably chronic. Electronically Signed   By: Paulina Fusi M.D.   On: 04/07/2023 12:24    Procedures .Marland KitchenLaceration Repair  Date/Time: 04/07/2023 2:10 PM  Performed by: Ernie Avena, MD Authorized by: Ernie Avena, MD   Consent:    Consent obtained:  Verbal   Consent given by:  Patient   Risks discussed:  Infection, pain, poor cosmetic result and poor wound healing Universal protocol:    Patient identity confirmed:  Verbally with patient and arm band Anesthesia:    Anesthesia method:  Local infiltration   Local anesthetic:  Lidocaine 1% w/o epi Laceration details:    Location:  Finger   Finger location:  L small finger   Length (cm):  1   Depth (mm):  1 Pre-procedure details:    Preparation:  Imaging obtained to evaluate for foreign bodies Exploration:    Imaging outcome: foreign body not noted     Wound exploration: wound explored through full range of motion   Treatment:    Area cleansed with:   Povidone-iodine   Amount of cleaning:  Standard Skin repair:    Repair method:  Sutures   Suture size:  5-0   Suture material:  Prolene   Suture technique:  Simple interrupted   Number of sutures:  5 Approximation:    Approximation:  Close Repair type:    Repair type:  Simple Post-procedure details:    Dressing:  Sterile dressing and antibiotic ointment   Procedure completion:  Tolerated     Medications Ordered in ED Medications  Tdap (BOOSTRIX) injection 0.5 mL (0.5 mLs Intramuscular Given 04/07/23 1237)  lidocaine (PF) (XYLOCAINE) 1 % injection 10 mL (10 mLs Infiltration Given 04/07/23 1238)  acetaminophen (TYLENOL) tablet 650 mg (650 mg Oral Given 04/07/23 1237)    ED Course/ Medical Decision Making/ A&P  Medical Decision Making Amount and/or Complexity of Data Reviewed Radiology: ordered.  Risk OTC drugs. Prescription drug management.     89 year old male with medical history significant for HTN, HLD, osteoarthritis who presents to the emergency department after a mechanical fall.  The patient states that he was leaning backwards and fell back onto carpet sustaining skin tears onto his left arm and hand.  He sustained a laceration to his left pinky.  His tetanus is not up-to-date.  He is not on anticoagulation, denies any head or neck trauma and denies any loss of consciousness.  He states that he primarily has had pain in the setting of a skin tear along his left forearm/elbow and pain in his left pinky.  He arrives to the emergency department GCS 15, ABC intact.  On arrival, the patient was vitally stable, presenting with left forearm and hand injury after a ground-level mechanical fall.  No head trauma or loss of consciousness.  Physical exam significant for No midline tenderness to palpation of the thoracic or lumbar spine. Extremities atraumatic with intact ROM with the exception of hematoma and skin tear to the left forearm and close to the  left elbow, no tenderness of the medial or lateral epicondyle Lyles or olecranon, left pinky with bruising and tenderness to palpation, volar laceration noted along the fifth digit, hemostatic, distal left upper extremity neurovascular intact with intact motor function along the median, ulnar, radial nerve distributions, 2+ radial pulses.   DG Left Hand: Negative DG Left Forearm: Negative  The patient's wound was irrigated bedside by nursing, soaked in iodine and subsequently closed per the procedure note with 5 Prolene sutures.  Infectious return precautions provided in the event of signs of flexor tenosynovitis or cellulitis about the patient's skin tear.  Wound care instructions were provided by nursing and the patient's wounds were wrapped bedside.  Will discharge on a course of Keflex with close return precautions provided.  Plan for outpatient follow-up for wound reassessment and suture removal.   Final Clinical Impression(s) / ED Diagnoses Final diagnoses:  Fall, initial encounter  Skin tear of elbow without complication, initial encounter  Laceration of left little finger without foreign body without damage to nail, initial encounter    Rx / DC Orders ED Discharge Orders          Ordered    cephALEXin (KEFLEX) 500 MG capsule  4 times daily        04/07/23 1405              Ernie Avena, MD 04/07/23 1417

## 2023-04-11 ENCOUNTER — Encounter: Payer: Self-pay | Admitting: Neurology

## 2023-04-15 ENCOUNTER — Ambulatory Visit (INDEPENDENT_AMBULATORY_CARE_PROVIDER_SITE_OTHER): Payer: Medicare Other | Admitting: Internal Medicine

## 2023-04-15 ENCOUNTER — Encounter: Payer: Self-pay | Admitting: Internal Medicine

## 2023-04-15 ENCOUNTER — Telehealth: Payer: Self-pay

## 2023-04-15 VITALS — BP 118/60 | HR 81 | Temp 98.3°F | Ht 67.0 in | Wt 132.0 lb

## 2023-04-15 DIAGNOSIS — S61219A Laceration without foreign body of unspecified finger without damage to nail, initial encounter: Secondary | ICD-10-CM | POA: Insufficient documentation

## 2023-04-15 DIAGNOSIS — S61217D Laceration without foreign body of left little finger without damage to nail, subsequent encounter: Secondary | ICD-10-CM

## 2023-04-15 DIAGNOSIS — W19XXXA Unspecified fall, initial encounter: Secondary | ICD-10-CM

## 2023-04-15 DIAGNOSIS — S51812D Laceration without foreign body of left forearm, subsequent encounter: Secondary | ICD-10-CM

## 2023-04-15 DIAGNOSIS — S51819A Laceration without foreign body of unspecified forearm, initial encounter: Secondary | ICD-10-CM | POA: Insufficient documentation

## 2023-04-15 DIAGNOSIS — C9110 Chronic lymphocytic leukemia of B-cell type not having achieved remission: Secondary | ICD-10-CM | POA: Diagnosis not present

## 2023-04-15 MED ORDER — MUPIROCIN 2 % EX OINT: TOPICAL_OINTMENT | CUTANEOUS | 0 refills | Status: DC

## 2023-04-15 NOTE — Telephone Encounter (Signed)
Transition Care Management Follow-up Telephone Call Date of discharge and from where: 04/07/2023 [facility] How have you been since you were released from the hospital? Patient stated he is feeling better. Any questions or concerns? No  Items Reviewed: Did the pt receive and understand the discharge instructions provided? Yes  Medications obtained and verified? Yes  Other? No  Any new allergies since your discharge? No  Dietary orders reviewed? Yes Do you have support at home? Yes    Follow up appointments reviewed:  PCP Hospital f/u appt confirmed? Yes  Scheduled to see Jacinta Shoe MD on 04/15/2023 @ [facility] [place]. Specialist Hospital f/u appt confirmed? No  Scheduled to see  on  @ . Are transportation arrangements needed? No  If their condition worsens, is the pt aware to call PCP or go to the Emergency Dept.? Yes Was the patient provided with contact information for the PCP's office or ED? Yes Was to pt encouraged to call back with questions or concerns? Yes  Juel Ripley [facility]  [facility] Population Health Community Resource Care Guide   [email or web address]  ?? [phone]   Website: [email or web address]  [email or web address]

## 2023-04-15 NOTE — Assessment & Plan Note (Addendum)
Sutures removed Dressed the wound

## 2023-04-15 NOTE — Assessment & Plan Note (Signed)
Fall prevention 

## 2023-04-15 NOTE — Assessment & Plan Note (Signed)
L forearm laceration re-dressed w/TELFA Coban applied Bactroban oint Rx

## 2023-04-15 NOTE — Assessment & Plan Note (Signed)
Monitor CBC/WBC 

## 2023-04-15 NOTE — Progress Notes (Signed)
Subjective:  Patient ID: Jerry Gomez, male    DOB: Jan 31, 1931  Age: 89 y.o. MRN: [id number]  CC: Suture / Staple Removal (LT PINKY FINGER)   HPI Jerry Gomez presents for a s/p fall on 4/29. No LOC. Sutures on L pinky F/u CLL   Outpatient Medications Prior to Visit  Medication Sig Dispense Refill   Artificial Tear Ointment (ARTIFICIAL TEARS) ointment Place into both eyes nightly.     aspirin 81 MG EC tablet Take 81 mg by mouth daily. Swallow whole.     B Complex-Folic Acid (B COMPLEX PLUS) TABS Take 1 tablet by mouth every other day. 100 tablet 3   cephALEXin (KEFLEX) 500 MG capsule Take 1 capsule (500 mg total) by mouth 4 (four) times daily. 20 capsule 0   Cholecalciferol (VITAMIN D) 2000 units tablet Take 2,000 Units by mouth daily.     COVID-19 mRNA vaccine 2023-2024 (COMIRNATY) syringe Inject into the muscle. 0.3 mL 0   Ensure (ENSURE) Take 237 mLs by mouth.     furosemide (LASIX) 20 MG tablet TAKE 1 TABLET BY MOUTH EVERY DAY 90 tablet 1   Multiple Vitamins-Minerals (MULTIVITAMIN ADULT) CHEW Take one qod 100 tablet 3   No facility-administered medications prior to visit.    ROS: Review of Systems  Constitutional:  Negative for appetite change, fatigue and unexpected weight change.  HENT:  Negative for congestion, nosebleeds, sneezing, sore throat and trouble swallowing.   Eyes:  Negative for itching and visual disturbance.  Respiratory:  Negative for cough.   Cardiovascular:  Negative for chest pain, palpitations and leg swelling.  Gastrointestinal:  Negative for abdominal distention, blood in stool, diarrhea and nausea.  Genitourinary:  Negative for frequency and hematuria.  Musculoskeletal:  Positive for gait problem. Negative for back pain, joint swelling and neck pain.  Skin:  Positive for wound. Negative for rash.  Neurological:  Positive for weakness. Negative for dizziness, tremors and speech difficulty.  Hematological:  Bruises/bleeds easily.   Psychiatric/Behavioral:  Negative for agitation, dysphoric mood and sleep disturbance. The patient is not nervous/anxious.     Objective:  BP 118/60 (BP Location: Right Arm, Patient Position: Sitting, Cuff Size: Normal)   Pulse 81   Temp 98.3 F (36.8 C) (Oral)   Ht 5\' 7"  (1.702 m)   Wt 132 lb (59.9 kg)   SpO2 93%   BMI 20.67 kg/m   BP Readings from Last 3 Encounters:  04/15/23 118/60  04/07/23 (!) 134/58  04/03/23 (!) 98/48    Wt Readings from Last 3 Encounters:  04/15/23 132 lb (59.9 kg)  04/03/23 132 lb (59.9 kg)  01/27/23 133 lb 12.8 oz (60.7 kg)    Physical Exam Constitutional:      General: He is not in acute distress.    Appearance: Normal appearance. He is well-developed.     Comments: NAD  Eyes:     Conjunctiva/sclera: Conjunctivae normal.     Pupils: Pupils are equal, round, and reactive to light.  Neck:     Thyroid: No thyromegaly.     Vascular: No JVD.  Cardiovascular:     Rate and Rhythm: Normal rate and regular rhythm.     Heart sounds: Normal heart sounds. No murmur heard.    No friction rub. No gallop.  Pulmonary:     Effort: Pulmonary effort is normal. No respiratory distress.     Breath sounds: Normal breath sounds. No wheezing or rales.  Chest:     Chest wall: No tenderness.  Abdominal:     General: Bowel sounds are normal. There is no distension.     Palpations: Abdomen is soft. There is no mass.     Tenderness: There is no abdominal tenderness. There is no guarding or rebound.  Musculoskeletal:        General: No tenderness. Normal range of motion.     Cervical back: Normal range of motion.  Lymphadenopathy:     Cervical: No cervical adenopathy.  Skin:    General: Skin is warm and dry.     Findings: No erythema or rash.  Neurological:     Mental Status: He is alert and oriented to person, place, and time.     Cranial Nerves: No cranial nerve deficit.     Motor: No abnormal muscle tone.     Coordination: Coordination abnormal.      Gait: Gait abnormal.     Deep Tendon Reflexes: Reflexes are normal and symmetric.  Psychiatric:        Behavior: Behavior normal.        Thought Content: Thought content normal.        Judgment: Judgment normal.    L pinky sutures were removed L forearm laceration re-dressed  Coban applied    A total time of 30 minutes was spent preparing to see the patient, reviewing tests, x-rays, operative reports and other medical records.  Also, obtaining history and performing comprehensive physical exam.  Additionally, counseling the patient regarding the above listed issues.   Finally, documenting clinical information in the health records, coordination of care, educating the patient - wound care. ER note reviewed.   Lab Results  Component Value Date   WBC 38.7 (H) 01/27/2023   HGB 12.5 (L) 01/27/2023   HCT 38.1 (L) 01/27/2023   PLT 141 (L) 01/27/2023   GLUCOSE 91 01/27/2023   CHOL 119 08/14/2021   TRIG 57.0 08/14/2021   HDL 53.30 08/14/2021   LDLCALC 55 08/14/2021   ALT 11 01/27/2023   AST 18 01/27/2023   NA 139 01/27/2023   K 4.0 01/27/2023   CL 103 01/27/2023   CREATININE 0.64 01/27/2023   BUN 18 01/27/2023   CO2 31 01/27/2023   TSH 1.33 04/30/2022   PSA 8.51 (H) 08/14/2021    DG Forearm Left  Result Date: 04/07/2023 CLINICAL DATA:  Fall with pain and bruising EXAM: LEFT FOREARM - 2 VIEW COMPARISON:  None Available. FINDINGS: There is no evidence of fracture or other focal bone lesions. Soft tissues are unremarkable. IMPRESSION: Negative. Electronically Signed   By: Paulina Fusi M.D.   On: 04/07/2023 12:24   DG Hand Complete Left  Result Date: 04/07/2023 CLINICAL DATA:  Fall with laceration to the fifth digit EXAM: LEFT HAND - COMPLETE 3+ VIEW COMPARISON:  None Available. FINDINGS: No evidence of acute fracture or dislocation. Small periarticular calcifications adjacent to the inter phalangeal joints of the small finger are probably chronic rather than indicative of an acute  fracture. No other significant finding in the region. IMPRESSION: No acute or traumatic finding. Small periarticular calcifications adjacent to the interphalangeal joints of the small finger, probably chronic. Electronically Signed   By: Paulina Fusi M.D.   On: 04/07/2023 12:24    Assessment & Plan:   Problem List Items Addressed This Visit     CLL (chronic lymphocytic leukemia) [facility])    Monitor CBC/WBC      Fall    Fall prevention      Finger laceration - Primary  Sutures removed Dressed the wound      Forearm laceration    L forearm laceration re-dressed w/TELFA Coban applied Bactroban oint Rx         Meds ordered this encounter  Medications   mupirocin ointment (BACTROBAN) 2 %    Sig: On leg wound w/dressing change qd or bid    Dispense:  30 g    Refill:  0      Follow-up: Return for a follow-up visit.  Sonda Primes, MD

## 2023-05-13 NOTE — Progress Notes (Signed)
Initial neurology clinic note  Reason for Evaluation: Consultation requested by [name], Georgina Quint, MD for an opinion regarding hand and leg weakness. My final recommendations will be communicated back to the requesting physician by way of shared medical record or letter to requesting physician via [place] mail.  HPI: This is [name], a 89 y.o. right-handed male with a medical history of CLL, OA, low back pain with left sided sciatica, avascular necrosis in bilateral hips, HTN who presents to neurology clinic with the chief complaint of hand and leg weakness. The patient is accompanied by wife and son.  Patient's symptoms started about 1.5 years ago. He had a fall in 12/2021. Patient was getting off the toilet and his legs gave out, so he fell. EMS had to be called to get patient up. Patient was evaluated at the ED for 12 hours. One week later patient had a similar fall after getting up from the toilet (01/2022). He stayed overnight. Per discharge summary: MRI spine was done and results were discussed with neurosurgery, Dr. Yetta Barre who noted L4/L5 moderate spinal canal stenosis, possible impingement of right L4 nerve root, unchanged from April 2022, and didn't feel like the findings explained his symptoms from that visit. MRI brain and DVT of RLE was also done and both negative. In the ED, VSS, labs with WBC 35 (chronic from CLL), pelvic xray: changes of avascular necrosis in bilateral femoral heads with subchondral cystic change and flattening. Severe joint space narrowing of both hips, right greater than left. Bone on bone on right.  Orthopedics consulted, recommend outpatient follow-up.  PT OT recommend home health PT/OT.   Patient was seen by ortho who recommended bilateral hip replacement. Patient and family declined due to age. Patient's mobility has worsened, particularly over the last 6 months. Patient is currently ambulating with a walker. Family does have a wheelchair which he uses when  he goes out. He has fallen twice this year (over the last few months, in the bedroom falling backward). Patient had a fall requiring ED visit on 04/07/23 when he fell backwards and cut his left arm and hand. He required stitches. He has been able to get himself up off the ground. He does not think he has significant pain in his legs or numbness and tingling. He is having hip pain. He thinks the right leg is weaker than the left leg. Right hip was worse on xrays.  Regarding his hands, over the last year or so, patient has great difficulty with weakness. He cannot write well. He feels his right hand is weaker than left. He has difficulty buttoning buttons. He can open bottles, but sometimes has difficulty. He does not have noticeable pain. He describes stiffness and some numbness.  Patient did PT during early 2023, including pool therapy. He last did this last year. He is much less active now.  He denies significant neck or back pain.  Patient is on B complex. He is not on other B12 supplement.   Most recent assessment and plan by Dr. Arbutus Ped in oncology from 01/27/23: This is a very pleasant 89 years old white male with a stage 0 chronic lymphocytic leukemia.  He is currently on observation.  The patient is doing fine today with no concerning complaints except for the mild fatigue. Repeat CBC today showed elevated white blood count of 38.7.  Hemoglobin was 12.5 and platelets 38.1% with platelet count of 141,000. I recommended for the patient to continue on observation with repeat  blood work and 6 months. He was advised to call immediately if he has any concerning symptoms in the interval. All questions were answered. The patient knows to call the clinic with any problems, questions or concerns. We can certainly see the patient much sooner if necessary.  He does not report any constitutional symptoms like fever, night sweats, anorexia or unintentional weight loss.   He has lost a lot of weight over  the last couple of years per family because patient is not eating as much and much less active.  EtOH use: Daily 1-2 glasses of wine or margarita   Restrictive diet? No   MEDICATIONS:  Outpatient Encounter Medications as of 05/21/2023  Medication Sig Note   Artificial Tear Ointment (ARTIFICIAL TEARS) ointment Place into both eyes nightly. 01/10/2022: prn   aspirin 81 MG EC tablet Take 81 mg by mouth daily. Swallow whole.    B Complex-Folic Acid (B COMPLEX PLUS) TABS Take 1 tablet by mouth every other day.    Cholecalciferol (VITAMIN D) 2000 units tablet Take 2,000 Units by mouth daily.    COVID-19 mRNA vaccine 2023-2024 (COMIRNATY) syringe Inject into the muscle.    Ensure (ENSURE) Take 237 mLs by mouth.    furosemide (LASIX) 20 MG tablet TAKE 1 TABLET BY MOUTH EVERY DAY    Multiple Vitamins-Minerals (MULTIVITAMIN ADULT) CHEW Take one qod    mupirocin ointment (BACTROBAN) 2 % On leg wound w/dressing change qd or bid    cephALEXin (KEFLEX) 500 MG capsule Take 1 capsule (500 mg total) by mouth 4 (four) times daily. (Patient not taking: Reported on 05/21/2023)    No facility-administered encounter medications on file as of 05/21/2023.    PAST MEDICAL HISTORY: Past Medical History:  Diagnosis Date   Cancer (HCC)    CLL   Cataract    COLONIC POLYPS 03/02/2009   EYE SURGERY, HX OF 09/05/2007   HEMORRHOIDS, HX OF    HYPERLIPIDEMIA 09/05/2007   HYPERTENSION 07/25/2007   OSTEOARTHRITIS 09/05/2007   SLEEP APNEA, OBSTRUCTIVE 09/05/2007    PAST SURGICAL HISTORY: Past Surgical History:  Procedure Laterality Date   EYE SURGERY  10/22/13   left eye, cataract    ALLERGIES: No Known Allergies  FAMILY HISTORY: Family History  Problem Relation Age of Onset   Kidney disease Father    Cancer Father        prostate-late on-set   Cancer Brother        non-hodgkins lymphoma   Colon cancer Neg Hx    Esophageal cancer Neg Hx    Rectal cancer Neg Hx    Stomach cancer Neg Hx     SOCIAL  HISTORY: Social History   Tobacco Use   Smoking status: Former    Types: Cigarettes    Quit date: 02/07/1963    Years since quitting: 60.3   Smokeless tobacco: Never  Vaping Use   Vaping Use: Never used  Substance Use Topics   Alcohol use: Yes    Alcohol/week: 14.0 standard drinks of alcohol    Types: 14 Standard drinks or equivalent per week    Comment: wine   Drug use: No   Social History   Social History Narrative   [employer], [place]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 Married '54. 2 sons ' 63, '67. 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 CPR- yes, short-term mechanical ventilation, no prolonged heroic or futile measures.       Right hand   Leaving 2 level home  Drink coffee and ice tea     OBJECTIVE: PHYSICAL EXAM: BP (!) 124/58 (BP Location: Right Arm, Patient Position: Sitting, Cuff Size: Normal)   Pulse 76   Ht 5\' 7"  (1.702 m)   Wt 137 lb (62.1 kg)   SpO2 96%   BMI 21.46 kg/m   General: General appearance: Awake and alert. No distress. Cooperative with exam.  Skin: No obvious rash or jaundice. HEENT: Atraumatic. Anicteric. Lungs: Non-labored breathing on room air  Extremities: Pitting edema to bilateral calves.  Psych: Affect appropriate.  Neurological: Mental Status: Alert. Speech fluent. No pseudobulbar affect Cranial Nerves: CNII: No RAPD. Visual fields grossly intact. CNIII, IV, VI: PERRL. No nystagmus. EOMI. CN V: Facial sensation intact bilaterally to fine touch. CN VII: Facial muscles symmetric and strong. No ptosis at rest. CN VIII: Hearing grossly intact bilaterally. CN IX: No hypophonia. CN X: Palate elevates symmetrically. CN XI: Full strength shoulder shrug bilaterally. CN XII: Tongue protrusion full and midline. No atrophy or fasciculations. No significant dysarthria Motor: Tone is normal.  Individual muscle group testing (MRC grade out of 5):  Movement     Neck flexion 5    Neck extension 5     Right Left    Shoulder abduction 5 5   Shoulder adduction 5 5   Shoulder ext rotation 5 5   Shoulder int rotation 5 5   Elbow flexion 5 5   Elbow extension 5 5   Finger abduction - FDI 4 5   Finger abduction - ADM 4 5   Finger extension 4 5   Finger distal flexion - 2/3 4 5    Finger distal flexion - 4/5 4 5    Thumb flexion - FPL 4+ 5   Thumb abduction - APB 4+ 5    Hip flexion 5- 5-   Hip extension 5 5   Hip adduction 5 5   Hip abduction 5 5   Knee extension 5 5   Knee flexion 5 5   Dorsiflexion 5 5   Plantarflexion 5 5     Reflexes:  Right Left   Bicep 2+ 2+   Tricep 2+ 2+   BrRad 2+ 2+   Knee 2+ 2+   Ankle 0 0    Pathological Reflexes: Babinski: flexor response bilaterally Hoffman: absent bilaterally Troemner: absent bilaterally Sensation: Pinprick: Intact in all extremities Coordination: Intact finger-to- nose-finger bilaterally.  Gait: Able to stand with assistance. Not able to ambulate safely.  Lab and Test Review: Internal labs: 03/25/23: ESR wnl B12: 243 CK: 118  CMP (01/27/23): wnl CBC (01/27/23): significant for elevated WBC (38.7) and lymphs (34.9), mildly low Hb and plts  Imaging: Cervical spine xray (10/24/22): FINDINGS: Spinal alignment is normal. There is no acute fracture. There is moderate disc space narrowing at C3-C4, C4-C5 and C6-C7. There is severe disc space narrowing at C5-C6. Endplate osteophytes are seen throughout. There is facet arthropathy C at C2-C3, C3-C4 and C4-C5 causing some mild bilateral neural foraminal stenosis. C1-C2 intervals within normal limits on the open-mouth view. There is no prevertebral soft tissue swelling.   IMPRESSION: 1. No acute fracture or malalignment. 2. Moderate to severe multilevel degenerative changes as described.  CT head wo contrast (10/24/22): FINDINGS: Brain: Generalized atrophy. Normal ventricular morphology. No midline shift or mass effect. Small vessel chronic ischemic changes of deep cerebral white  matter. No intracranial hemorrhage, mass lesion, evidence of acute infarction, or extra-axial fluid collection.   Vascular: No hyperdense vessels. Atherosclerotic calcifications of internal carotid  arteries at skull base   Skull: Intact   Sinuses/Orbits: Clear   Other: N/A   IMPRESSION: Atrophy with small vessel chronic ischemic changes of deep cerebral white matter.   No acute intracranial abnormalities.  MRI brain wo contrast (01/02/22): FINDINGS: There is residual contrast material from today's earlier lumbar spine MRI.   Brain: There is no evidence of an acute infarct, intracranial hemorrhage, mass, midline shift, or extra-axial fluid collection. T2 hyperintensities in the cerebral white matter bilaterally are nonspecific but compatible with mild-to-moderate chronic small vessel ischemic disease. There is moderate cerebral atrophy.   Vascular: Major intracranial vascular flow voids are preserved.   Skull and upper cervical spine: Unremarkable bone marrow signal para   Sinuses/Orbits: Bilateral cataract extraction. Mild mucosal thickening in the paranasal sinuses. Clear mastoid air cells.   Other: None.   IMPRESSION: 1. No acute intracranial abnormality. 2. Mild-to-moderate chronic small vessel ischemic disease and cerebral atrophy.  MRI lumbar spine w/wo contrast (01/02/22): FINDINGS: Segmentation: Standard; the lowest formed disc space is designated L5-S1.   Alignment: There is grade 1 retrolisthesis of L2 on L3 and grade 1 anterolisthesis of L4 on L5, unchanged. There is slight dextrocurvature centered at L3-L4. Alignment is otherwise normal.   Vertebrae: Vertebral body heights are preserved. Marrow signal is normal. There is no abnormal marrow edema or enhancement.   Conus medullaris and cauda equina: Conus extends to the T12-L1 level. Conus and cauda equina appear normal.   Paraspinal and other soft tissues: A lobulated T2 hyperintense lesion in the  right kidney is unchanged, likely reflecting a cyst. The paraspinal soft tissues are unremarkable.   Disc levels:   There is multilevel disc desiccation and narrowing, most advanced at L3-L4 and L5-S1. There is multilevel facet arthropathy, most advanced at L4-L5. There is an associated small effusion on the left. Findings are not significantly changed   T12-L1: No significant spinal canal or neural foraminal stenosis   L1-L2: There is a mild disc bulge and left worse than right facet arthropathy resulting in mild left and no significant right neural foraminal stenosis and no significant spinal canal stenosis, unchanged.   L2-L3: There is grade 1 retrolisthesis with a mild bulge and mild bilateral facet arthropathy resulting in mild narrowing of the left subarticular zone and mild left and no significant right neural foraminal stenosis, not significantly changed.   L3-L4: There is a mild disc bulge and mild bilateral facet arthropathy resulting in mild left and no significant right neural foraminal stenosis and no significant spinal canal stenosis, unchanged.   L4-L5: There is grade 1 anterolisthesis with a mild bulge, ligamentum flavum thickening, and moderate bilateral facet arthropathy resulting in moderate spinal canal stenosis with crowding of the cauda equina nerve roots and subarticular zones with possible impingement of the traversing right L4 nerve root (7-32), and mild bilateral neural foraminal stenosis. Findings are unchanged.   L5-S1: There is a mild disc bulge and left worse than right facet arthropathy resulting in narrowing of the bilateral subarticular zones, right more than left, and moderate right and no significant left neural foraminal stenosis. Extraforaminal disc material again contacts the exiting right L5 nerve root (9-5). Findings are unchanged.   IMPRESSION: 1. Mild disc bulge, ligamentum flavum thickening, and bulky facet arthropathy at L4-L5  resulting in moderate spinal canal stenosis with crowding of the subarticular zones and cauda equina nerve roots with possible impingement of the traversing right L4 nerve root, and mild bilateral neural foraminal stenosis, unchanged since 03/22/2021. 2.  Disc bulge and bilateral facet arthropathy at L5-S1 resulting in narrowing of the bilateral subarticular zones, right worse than left, and contact of the exiting right L5 nerve root by extraforaminal disc material, also not significantly changed. 3. Mild degenerative changes at the remaining levels as detailed above without other significant spinal canal or neural foraminal stenosis, and no other evidence of nerve root impingement.  Xray of pelvis (01/10/22): FINDINGS: The bones are diffusely markedly osteopenic. There is no evidence for acute fracture or dislocation. There are changes of avascular necrosis in the bilateral femoral heads with subchondral cystic change and flattening. There is severe joint space narrowing of both hips, right greater than left. There is bone-on-bone configuration on the right. Soft tissues are within normal limits.   IMPRESSION: 1. No acute fracture or dislocation. 2. 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 3. Severe bilateral hip degenerative change, right greater than left. 4. Diffuse osteopenia.  ASSESSMENT: Jerry Gomez is a 89 y.o. male who presents for evaluation of leg weakness and right greater than left hand weakness. He has a relevant medical history of CLL, OA, low back pain with left sided sciatica, avascular necrosis in bilateral hips, HTN. His neurological examination is pertinent for right hand weakness and difficulty standing and ambulating. Available diagnostic data is significant for MRI lumbar spine showing spinal stenosis and multilevel foraminal stenosis. Cervical spine xray showed moderate to severe degenerative changes. Hip xray showed avascular necrosis of  bilateral femoral heads and severe bilateral hip degenerative changes.  Patient's inability to walk and leg weakness is likely multifactorial with contributions from avascular necrosis and severe hip degenerative changes, lumbar radiculopathy, and deconditioning. The weakness in his hands are more clear in the right hand and are in muscles all of C8 or T1 distribution. He does not endorse neck pain, but I suspect this may be cervical radiculopathy. I would like to get an MRI of the cervical spine to evaluate for this and ensure there is no cervical stenosis contributing to difficulty with legs.  PLAN: -MRI cervical spine wo contrast -Referral to PT - Patient has previously seen Tom[facility] at [facility] for [facility] therapy and would like to do this again if able. Pool therapy would be an excellent option given his hip problems. -B12 supplementation with 1000 mcg daily  -Return to clinic in 6 months  The impression above as well as the plan as outlined below were extensively discussed with the patient (in the company of wife and son) who voiced understanding. All questions were answered to their satisfaction.  The patient was counseled on pertinent fall precautions per the printed material provided today, and as noted under the "Patient Instructions" section below.  When available, results of the above investigations and possible further recommendations will be communicated to the patient via telephone/MyChart. Patient to call office if not contacted after expected testing turnaround time.   Total time spent reviewing records, interview, history/exam, documentation, and coordination of care on day of encounter:  80 min   Thank you for allowing me to participate in patient's care.  If I can answer any additional questions, I would be pleased to do so.  Jacquelyne Balint, MD   CC: [name], Georgina Quint, MD [address] [place] [address]  CC: Referring provider: [name], Georgina Quint, MD [address] [place],  [place] [address]

## 2023-05-21 ENCOUNTER — Encounter: Payer: Self-pay | Admitting: Neurology

## 2023-05-21 ENCOUNTER — Ambulatory Visit (INDEPENDENT_AMBULATORY_CARE_PROVIDER_SITE_OTHER): Payer: Medicare Other | Admitting: Neurology

## 2023-05-21 VITALS — BP 124/58 | HR 76 | Ht 67.0 in | Wt 137.0 lb

## 2023-05-21 DIAGNOSIS — R531 Weakness: Secondary | ICD-10-CM

## 2023-05-21 DIAGNOSIS — M5416 Radiculopathy, lumbar region: Secondary | ICD-10-CM | POA: Diagnosis not present

## 2023-05-21 DIAGNOSIS — R29898 Other symptoms and signs involving the musculoskeletal system: Secondary | ICD-10-CM | POA: Diagnosis not present

## 2023-05-21 DIAGNOSIS — M87052 Idiopathic aseptic necrosis of left femur: Secondary | ICD-10-CM | POA: Diagnosis not present

## 2023-05-21 DIAGNOSIS — M87051 Idiopathic aseptic necrosis of right femur: Secondary | ICD-10-CM

## 2023-05-21 NOTE — Patient Instructions (Addendum)
Your leg weakness and inability to walk is likely due to your known hip problems, pinching of nerves in your back (lumbar radiculopathy), and inactivity.  Your arm weakness is less clear, but based on your exam, I am concerned for a pinched nerve in your neck.  I would like to investigate your symptoms with an MRI of your neck (cervical spine). I will be in touch when I have the results.  Your B12 was borderline low. Given your symptoms, I would recommend supplementing with B12 1000 mcg daily. This can be bought over the counter at any local drug store or online.   Consider stopping the B complex.  The physicians and staff at [facility] are committed to providing excellent care. You may receive a survey requesting feedback about your experience at our office. We strive to receive "very good" responses to the survey questions. If you feel that your experience would prevent you from giving the office a "very good " response, please contact our office to try to remedy the situation. We may be reached at [phone]. Thank you for taking the time out of your busy day to complete the survey.  Jacquelyne Balint, MD  Neurology  Preventing Falls at [facility] are common, often dreaded events in the lives of older people. Aside from the obvious injuries and even death that may result, fall can cause wide-ranging consequences including loss of independence, mental decline, decreased activity and mobility. Younger people are also at risk of falling, especially those with chronic illnesses and fatigue.  Ways to reduce risk for falling Examine diet and medications. Warm foods and alcohol dilate blood vessels, which can lead to dizziness when standing. Sleep aids, antidepressants and pain medications can also increase the likelihood of a fall.  Get a vision exam. Poor vision, cataracts and glaucoma increase the chances of falling.  Check foot gear. Shoes should fit snugly and have a sturdy,  nonskid sole and a broad, low heel  Participate in a physician-approved exercise program to build and maintain muscle strength and improve balance and coordination. Programs that use ankle weights or stretch bands are excellent for muscle-strengthening. Water aerobics programs and low-impact Tai Chi programs have also been shown to improve balance and coordination.  Increase vitamin D intake. Vitamin D improves muscle strength and increases the amount of calcium the body is able to absorb and deposit in bones.  How to prevent falls from common hazards Floors - Remove all loose wires, cords, and throw rugs. Minimize clutter. Make sure rugs are anchored and smooth. Keep furniture in its usual place.  Chairs -- Use chairs with straight backs, armrests and firm seats. Add firm cushions to existing pieces to add height.  Bathroom - Install grab bars and non-skid tape in the tub or shower. Use a bathtub transfer bench or a shower chair with a back support Use an elevated toilet seat and/or safety rails to assist standing from a low surface. Do not use towel racks or bathroom tissue holders to help you stand.  Lighting - Make sure halls, stairways, and entrances are well-lit. Install a night light in your bathroom or hallway. Make sure there is a light switch at the top and bottom of the staircase. Turn lights on if you get up in the middle of the night. Make sure lamps or light switches are within reach of the bed if you have to get up during the night.  Kitchen - Install non-skid rubber mats near the sink and  stove. Clean spills immediately. Store frequently used utensils, pots, pans between waist and eye level. This helps prevent reaching and bending. Sit when getting things out of lower cupboards.  Living room/ Bedrooms - Place furniture with wide spaces in between, giving enough room to move around. Establish a route through the living room that gives you something to hold onto as you walk.  Stairs  - Make sure treads, rails, and rugs are secure. Install a rail on both sides of the stairs. If stairs are a threat, it might be helpful to arrange most of your activities on the lower level to reduce the number of times you must climb the stairs.  Entrances and doorways - Install metal handles on the walls adjacent to the doorknobs of all doors to make it more secure as you travel through the doorway.  Tips for maintaining balance Keep at least one hand free at all times. Try using a backpack or fanny pack to hold things rather than carrying them in your hands. Never carry objects in both hands when walking as this interferes with keeping your balance.  Attempt to swing both arms from front to back while walking. This might require a conscious effort if Parkinson's disease has diminished your movement. It will, however, help you to maintain balance and posture, and reduce fatigue.  Consciously lift your feet off of the ground when walking. Shuffling and dragging of the feet is a common culprit in losing your balance.  When trying to navigate turns, use a "U" technique of facing forward and making a wide turn, rather than pivoting sharply.  Try to stand with your feet shoulder-length apart. When your feet are close together for any length of time, you increase your risk of losing your balance and falling.  Do one thing at a time. Don't try to walk and accomplish another task, such as reading or looking around. The decrease in your automatic reflexes complicates motor function, so the less distraction, the better.  Do not wear rubber or gripping soled shoes, they might "catch" on the floor and cause tripping.  Move slowly when changing positions. Use deliberate, concentrated movements and, if needed, use a grab bar or walking aid. Count 15 seconds between each movement. For example, when rising from a seated position, wait 15 seconds after standing to begin walking.  If balance is a continuous  problem, you might want to consider a walking aid such as a cane, walking stick, or walker. Once you've mastered walking with help, you might be ready to try it on your own again.

## 2023-06-14 ENCOUNTER — Emergency Department (HOSPITAL_COMMUNITY): Payer: Medicare Other

## 2023-06-14 ENCOUNTER — Emergency Department (HOSPITAL_COMMUNITY)
Admission: EM | Admit: 2023-06-14 | Discharge: 2023-06-16 | Disposition: A | Discharge status: Home / Self Care | Payer: Medicare Other | Attending: Emergency Medicine | Admitting: Emergency Medicine

## 2023-06-14 DIAGNOSIS — M5031 Other cervical disc degeneration,  high cervical region: Secondary | ICD-10-CM | POA: Diagnosis not present

## 2023-06-14 DIAGNOSIS — M79605 Pain in left leg: Secondary | ICD-10-CM | POA: Insufficient documentation

## 2023-06-14 DIAGNOSIS — I6782 Cerebral ischemia: Secondary | ICD-10-CM | POA: Diagnosis not present

## 2023-06-14 DIAGNOSIS — R200 Anesthesia of skin: Secondary | ICD-10-CM | POA: Diagnosis not present

## 2023-06-14 DIAGNOSIS — R296 Repeated falls: Secondary | ICD-10-CM

## 2023-06-14 DIAGNOSIS — D72829 Elevated white blood cell count, unspecified: Secondary | ICD-10-CM | POA: Diagnosis not present

## 2023-06-14 DIAGNOSIS — R600 Localized edema: Secondary | ICD-10-CM | POA: Insufficient documentation

## 2023-06-14 DIAGNOSIS — M160 Bilateral primary osteoarthritis of hip: Secondary | ICD-10-CM | POA: Diagnosis not present

## 2023-06-14 DIAGNOSIS — R58 Hemorrhage, not elsewhere classified: Secondary | ICD-10-CM | POA: Diagnosis not present

## 2023-06-14 DIAGNOSIS — R42 Dizziness and giddiness: Secondary | ICD-10-CM | POA: Insufficient documentation

## 2023-06-14 DIAGNOSIS — Z043 Encounter for examination and observation following other accident: Secondary | ICD-10-CM | POA: Diagnosis not present

## 2023-06-14 DIAGNOSIS — S51012A Laceration without foreign body of left elbow, initial encounter: Secondary | ICD-10-CM | POA: Diagnosis not present

## 2023-06-14 DIAGNOSIS — S41012A Laceration without foreign body of left shoulder, initial encounter: Secondary | ICD-10-CM | POA: Diagnosis not present

## 2023-06-14 DIAGNOSIS — M19022 Primary osteoarthritis, left elbow: Secondary | ICD-10-CM | POA: Diagnosis not present

## 2023-06-14 DIAGNOSIS — M50322 Other cervical disc degeneration at C5-C6 level: Secondary | ICD-10-CM | POA: Diagnosis not present

## 2023-06-14 DIAGNOSIS — Z856 Personal history of leukemia: Secondary | ICD-10-CM | POA: Diagnosis not present

## 2023-06-14 DIAGNOSIS — Z7982 Long term (current) use of aspirin: Secondary | ICD-10-CM | POA: Insufficient documentation

## 2023-06-14 DIAGNOSIS — M7989 Other specified soft tissue disorders: Secondary | ICD-10-CM | POA: Diagnosis not present

## 2023-06-14 DIAGNOSIS — M4801 Spinal stenosis, occipito-atlanto-axial region: Secondary | ICD-10-CM | POA: Diagnosis not present

## 2023-06-14 DIAGNOSIS — G9389 Other specified disorders of brain: Secondary | ICD-10-CM | POA: Diagnosis not present

## 2023-06-14 DIAGNOSIS — I10 Essential (primary) hypertension: Secondary | ICD-10-CM | POA: Insufficient documentation

## 2023-06-14 DIAGNOSIS — M79604 Pain in right leg: Secondary | ICD-10-CM | POA: Diagnosis not present

## 2023-06-14 DIAGNOSIS — W19XXXA Unspecified fall, initial encounter: Secondary | ICD-10-CM | POA: Diagnosis not present

## 2023-06-14 DIAGNOSIS — Z79899 Other long term (current) drug therapy: Secondary | ICD-10-CM | POA: Diagnosis not present

## 2023-06-14 DIAGNOSIS — M19012 Primary osteoarthritis, left shoulder: Secondary | ICD-10-CM | POA: Diagnosis not present

## 2023-06-14 DIAGNOSIS — S4992XA Unspecified injury of left shoulder and upper arm, initial encounter: Secondary | ICD-10-CM | POA: Diagnosis present

## 2023-06-14 DIAGNOSIS — M47812 Spondylosis without myelopathy or radiculopathy, cervical region: Secondary | ICD-10-CM | POA: Diagnosis not present

## 2023-06-14 NOTE — ED Provider Notes (Signed)
[place] EMERGENCY DEPARTMENT AT [facility] Provider Note   CSN: [id number] Arrival date & time: 06/14/23  2325     History {Add pertinent medical, surgical, social history, OB history to HPI:1} Chief Complaint  Patient presents with   Jerry Gomez is a 89 y.o. male.  Patient with a history of hypertension, CLL, osteoarthritis presenting with fall x 2 today.  States he normally walks with a walker.  He lost his balance and his "legs gave out" and landed on his left side.  Unknown if he hit his head.  No loss of consciousness.  No blood thinner use.  Complains of skin tear to his left elbow and left shoulder.  States his legs are swollen and admits to noncompliance with his Lasix.  He is prescribed every day but takes only 3-4 times a week.  No blood thinner use.  Denies any head, neck, back, chest or abdominal pain.  Complains of pain to his bilateral legs, left elbow and left shoulder.  No focal weakness, numbness or tingling.  No midline neck or back pain.  No bowel or bladder incontinence.  No fever. No preceding dizziness or lightheadedness.  Did feel slightly dizzy after falling  The history is provided by the patient and the EMS personnel.  Fall Pertinent negatives include no chest pain, no abdominal pain, no headaches and no shortness of breath.       Home Medications Prior to Admission medications   Medication Sig Start Date End Date Taking? Authorizing Provider  Artificial Tear Ointment (ARTIFICIAL TEARS) ointment Place into both eyes nightly.    [provider]  aspirin 81 MG EC tablet Take 81 mg by mouth daily. Swallow whole.    [provider]  B Complex-Folic Acid (B COMPLEX PLUS) TABS Take 1 tablet by mouth every other day. 04/03/23   [name], Georgina Quint, MD  cephALEXin (KEFLEX) 500 MG capsule Take 1 capsule (500 mg total) by mouth 4 (four) times daily. Patient not taking: Reported on 05/21/2023 04/07/23   Ernie Avena, MD   Cholecalciferol (VITAMIN D) 2000 units tablet Take 2,000 Units by mouth daily.    [provider]  COVID-19 mRNA vaccine [phone] (COMIRNATY) syringe Inject into the muscle. 10/11/22     Ensure (ENSURE) Take 237 mLs by mouth.    [provider]  furosemide (LASIX) 20 MG tablet TAKE 1 TABLET BY MOUTH EVERY DAY 07/18/22   [name], Georgina Quint, MD  Multiple Vitamins-Minerals (MULTIVITAMIN ADULT) CHEW Take one qod 04/03/23   [name], Georgina Quint, MD  mupirocin ointment (BACTROBAN) 2 % On leg wound w/dressing change qd or bid 04/15/23   [name], Georgina Quint, MD      Allergies    Patient has no known allergies.    Review of Systems   Review of Systems  Constitutional:  Negative for activity change, appetite change and fever.  HENT:  Negative for congestion and rhinorrhea.   Respiratory:  Negative for cough, chest tightness and shortness of breath.   Cardiovascular:  Negative for chest pain.  Gastrointestinal:  Negative for abdominal pain, nausea and vomiting.  Genitourinary:  Negative for dysuria and hematuria.  Musculoskeletal:  Positive for arthralgias and myalgias. Negative for back pain and neck pain.  Skin:  Positive for wound.  Neurological:  Negative for dizziness, weakness and headaches.   all other systems are negative except as noted in the HPI and PMH.    Physical Exam Updated Vital Signs BP [name])  146/67 (BP Location: Right Arm)   Pulse 95   Temp 98.2 F (36.8 C) (Oral)   Resp 15   SpO2 100%  Physical Exam Vitals and nursing note reviewed.  Constitutional:      General: He is not in acute distress.    Appearance: He is well-developed. He is not ill-appearing.  HENT:     Head: Normocephalic and atraumatic.     Mouth/Throat:     Pharynx: No oropharyngeal exudate.  Eyes:     Conjunctiva/sclera: Conjunctivae normal.     Pupils: Pupils are equal, round, and reactive to light.  Neck:     Comments: No C-spine tenderness Cardiovascular:     Rate and Rhythm:  Normal rate and regular rhythm.     Heart sounds: Normal heart sounds. No murmur heard. Pulmonary:     Effort: Pulmonary effort is normal. No respiratory distress.     Breath sounds: Normal breath sounds.  Abdominal:     Palpations: Abdomen is soft.     Tenderness: There is no abdominal tenderness. There is no guarding or rebound.  Musculoskeletal:        General: No tenderness. Normal range of motion.     Cervical back: Normal range of motion and neck supple.     Right lower leg: Edema present.     Left lower leg: Edema present.     Comments: Skin tear left shoulder left elbow, no bony tenderness. No T or L-spine pain, full range of motion of hips without pain.  Skin:    General: Skin is warm.  Neurological:     Mental Status: He is alert and oriented to person, place, and time.     Cranial Nerves: No cranial nerve deficit.     Motor: No abnormal muscle tone.     Coordination: Coordination normal.     Comments: No ataxia on finger to nose bilaterally. No pronator drift. 5/5 strength throughout. CN 2-12 intact.Equal grip strength. Sensation intact.   Psychiatric:        Behavior: Behavior normal.     ED Results / Procedures / Treatments   Labs (all labs ordered are listed, but only abnormal results are displayed) Labs Reviewed  CBC WITH DIFFERENTIAL/PLATELET  COMPREHENSIVE METABOLIC PANEL  BRAIN NATRIURETIC PEPTIDE  URINALYSIS, ROUTINE W REFLEX MICROSCOPIC  TROPONIN I (HIGH SENSITIVITY)    EKG None  Radiology No results found.  Procedures Procedures  {Document cardiac monitor, telemetry assessment procedure when appropriate:1}  Medications Ordered in ED Medications - No data to display  ED Course/ Medical Decision Making/ A&P   {   Click here for ABCD2, HEART and other calculatorsREFRESH Note before signing :1}                          Medical Decision Making Amount and/or Complexity of Data Reviewed Labs: ordered. Decision-making details documented in ED  Course. Radiology: ordered and independent interpretation performed. Decision-making details documented in ED Course. ECG/medicine tests: ordered and independent interpretation performed. Decision-making details documented in ED Course.   Fall with skin tears to the left shoulder and left elbow.  Unknown head injury.  GCS is 15, ABCs are intact.  Complains of pain to left shoulder and left elbow only.  Does have leg edema likely secondary to noncompliance with Lasix.  No increased work of breathing.  {Document critical care time when appropriate:1} {Document review of labs and clinical decision tools ie heart score, Chads2Vasc2 etc:1}  {Document  your independent review of radiology images, and any outside records:1} {Document your discussion with family members, caretakers, and with consultants:1} {Document social determinants of health affecting pt's care:1} {Document your decision making why or why not admission, treatments were needed:1} Final Clinical Impression(s) / ED Diagnoses Final diagnoses:  None    Rx / DC Orders ED Discharge Orders     None

## 2023-06-14 NOTE — ED Triage Notes (Signed)
Pt BIB [facility] from home. Pt had 2 witnessed falls today. 2nd fall due to feet being numb. Skin tears on left elbow and left shoulder. Dry blood observed on left ear. Denies blood thinners. No LOC. Hx edema and currently on lasix.  130/60 90HR 99 O2 RA 119CBG

## 2023-06-15 ENCOUNTER — Emergency Department (HOSPITAL_COMMUNITY): Payer: Medicare Other

## 2023-06-15 ENCOUNTER — Encounter (HOSPITAL_COMMUNITY): Payer: Self-pay

## 2023-06-15 ENCOUNTER — Other Ambulatory Visit: Payer: Self-pay

## 2023-06-15 DIAGNOSIS — S41012A Laceration without foreign body of left shoulder, initial encounter: Secondary | ICD-10-CM | POA: Diagnosis not present

## 2023-06-15 DIAGNOSIS — M5031 Other cervical disc degeneration,  high cervical region: Secondary | ICD-10-CM | POA: Diagnosis not present

## 2023-06-15 DIAGNOSIS — M160 Bilateral primary osteoarthritis of hip: Secondary | ICD-10-CM | POA: Diagnosis not present

## 2023-06-15 DIAGNOSIS — M47812 Spondylosis without myelopathy or radiculopathy, cervical region: Secondary | ICD-10-CM | POA: Diagnosis not present

## 2023-06-15 DIAGNOSIS — M50322 Other cervical disc degeneration at C5-C6 level: Secondary | ICD-10-CM | POA: Diagnosis not present

## 2023-06-15 DIAGNOSIS — M19012 Primary osteoarthritis, left shoulder: Secondary | ICD-10-CM | POA: Diagnosis not present

## 2023-06-15 DIAGNOSIS — M7989 Other specified soft tissue disorders: Secondary | ICD-10-CM | POA: Diagnosis not present

## 2023-06-15 DIAGNOSIS — M19022 Primary osteoarthritis, left elbow: Secondary | ICD-10-CM | POA: Diagnosis not present

## 2023-06-15 DIAGNOSIS — G9389 Other specified disorders of brain: Secondary | ICD-10-CM | POA: Diagnosis not present

## 2023-06-15 DIAGNOSIS — M4801 Spinal stenosis, occipito-atlanto-axial region: Secondary | ICD-10-CM | POA: Diagnosis not present

## 2023-06-15 DIAGNOSIS — I6782 Cerebral ischemia: Secondary | ICD-10-CM | POA: Diagnosis not present

## 2023-06-15 DIAGNOSIS — Z043 Encounter for examination and observation following other accident: Secondary | ICD-10-CM | POA: Diagnosis not present

## 2023-06-15 LAB — CBC WITH DIFFERENTIAL/PLATELET
Abs Immature Granulocytes: 0.06 K/uL (ref 0.00–0.07)
Basophils Absolute: 0.1 K/uL (ref 0.0–0.1)
Basophils Relative: 0 %
Eosinophils Absolute: 0 K/uL (ref 0.0–0.5)
Eosinophils Relative: 0 %
HCT: 41.5 % (ref 39.0–52.0)
Hemoglobin: 13.3 g/dL (ref 13.0–17.0)
Immature Granulocytes: 0 %
Lymphocytes Relative: 86 %
Lymphs Abs: 36.6 K/uL — ABNORMAL HIGH (ref 0.7–4.0)
MCH: 31.7 pg (ref 26.0–34.0)
MCHC: 32 g/dL (ref 30.0–36.0)
MCV: 99 fL (ref 80.0–100.0)
Monocytes Absolute: 0.8 K/uL (ref 0.1–1.0)
Monocytes Relative: 2 %
Neutro Abs: 4.9 K/uL (ref 1.7–7.7)
Neutrophils Relative %: 12 %
Platelets: 132 K/uL — ABNORMAL LOW (ref 150–400)
RBC: 4.19 MIL/uL — ABNORMAL LOW (ref 4.22–5.81)
RDW: 13.2 % (ref 11.5–15.5)
WBC: 42.4 K/uL — ABNORMAL HIGH (ref 4.0–10.5)
nRBC: 0 % (ref 0.0–0.2)

## 2023-06-15 LAB — URINALYSIS, ROUTINE W REFLEX MICROSCOPIC
Bacteria, UA: NONE SEEN
Bilirubin Urine: NEGATIVE
Glucose, UA: NEGATIVE mg/dL
Hgb urine dipstick: NEGATIVE
Ketones, ur: NEGATIVE mg/dL
Leukocytes,Ua: NEGATIVE
Nitrite: NEGATIVE
Protein, ur: 30 mg/dL — AB
Specific Gravity, Urine: 1.027 (ref 1.005–1.030)
pH: 5 (ref 5.0–8.0)

## 2023-06-15 LAB — BRAIN NATRIURETIC PEPTIDE: B Natriuretic Peptide: 91.9 pg/mL (ref 0.0–100.0)

## 2023-06-15 LAB — COMPREHENSIVE METABOLIC PANEL WITH GFR
ALT: 17 U/L (ref 0–44)
AST: 25 U/L (ref 15–41)
Albumin: 4.4 g/dL (ref 3.5–5.0)
Alkaline Phosphatase: 95 U/L (ref 38–126)
Anion gap: 8 (ref 5–15)
BUN: 18 mg/dL (ref 8–23)
CO2: 27 mmol/L (ref 22–32)
Calcium: 9 mg/dL (ref 8.9–10.3)
Chloride: 102 mmol/L (ref 98–111)
Creatinine, Ser: 0.69 mg/dL (ref 0.61–1.24)
GFR, Estimated: >60 mL/min (ref >60)
Glucose, Bld: 122 mg/dL — ABNORMAL HIGH (ref 70–99)
Potassium: 4.6 mmol/L (ref 3.5–5.1)
Sodium: 137 mmol/L (ref 135–145)
Total Bilirubin: 1.8 mg/dL — ABNORMAL HIGH (ref 0.3–1.2)
Total Protein: 7.3 g/dL (ref 6.5–8.1)

## 2023-06-15 LAB — TROPONIN I (HIGH SENSITIVITY)
Troponin I (High Sensitivity): 7 ng/L (ref <18)
Troponin I (High Sensitivity): 7 ng/L (ref <18)

## 2023-06-15 MED ORDER — FUROSEMIDE 10 MG/ML IJ SOLN
40.0000 mg | Freq: Once | INTRAMUSCULAR | Status: AC
  Administered 2023-06-15: 40 mg via INTRAVENOUS
  Filled 2023-06-15: qty 4

## 2023-06-15 MED ORDER — FUROSEMIDE 40 MG PO TABS
20.0000 mg | ORAL_TABLET | Freq: Every day | ORAL | Status: DC
  Filled 2023-06-15 (×2): qty 1

## 2023-06-15 MED ORDER — ASPIRIN 81 MG PO TBEC
81.0000 mg | DELAYED_RELEASE_TABLET | Freq: Every day | ORAL | Status: DC
  Administered 2023-06-15 – 2023-06-16 (×2): 81 mg via ORAL
  Filled 2023-06-15 (×2): qty 1

## 2023-06-15 NOTE — Progress Notes (Signed)
Awaiting PT eval.  

## 2023-06-15 NOTE — ED Provider Notes (Signed)
Emergency Medicine Observation Re-evaluation Note  Jerry Gomez is a 89 y.o. male, seen on rounds today.  Pt initially presented to the ED for complaints of Fall Currently, the patient is awake and alert.  Pt brought in last night for recurrent falls and weakness.  Wife unable to care for him at home.  Medical eval neg.  Pt is unable to ambulate safely.  TOC and PT consult placed.  Physical Exam  BP (!) 146/67 (BP Location: Right Arm)   Pulse 95   Temp 98.2 F (36.8 C) (Oral)   Resp 15   SpO2 100%  Physical Exam General: awake and alert Cardiac: rr Lungs: clear Psych: calm  ED Course / MDM  EKG:EKG Interpretation Date/Time:  Sunday June 15 2023 01:42:40 EDT Ventricular Rate:  95 PR Interval:  180 QRS Duration:  92 QT Interval:  347 QTC Calculation: 437 R Axis:   5  Text Interpretation: Sinus rhythm Abnormal R-wave progression, early transition No significant change was found Confirmed by Glynn Octave [phone]) on 06/15/2023 1:44:58 AM  I have reviewed the labs performed to date as well as medications administered while in observation.  Recent changes in the last 24 hours include medical clearance.  Plan  Current plan is for TOC and PT.    Jacalyn Lefevre, MD 06/15/23 [phone]

## 2023-06-15 NOTE — ED Notes (Signed)
Patient has been alert this shift. Patient calm and cooperative throughout shift.  Confusion noted. . Anxious at times during shift.  Patient medication compliant. Patient needs assist with ADLs.

## 2023-06-15 NOTE — Progress Notes (Signed)
Transition of Care [facility]) - Emergency Department Mini Assessment   Patient Details  Name: Jerry Gomez MRN: [id number] Date of Birth: 1931-08-02  Transition of Care [facility]) CM/SW Contact:    Princella Ion, LCSW Phone Number: 06/15/2023, 10:21 AM   Clinical Narrative: Pt presented to ED with multiple falls from home. This CSW met with pt and spouse at bedside. Pt's spouse states she typically would be interested in [facility]; however recognizes that the pt may benefit from rehab. This CSW explained SNF process and informed we will follow for PT's recommendation. This CSW provided pt's spouse with Medicare.gov and her preferred facilities are:  [facility]  TOC following for PT's recommendation.    ED Mini Assessment: What brought you to the Emergency Department? : falls  Barriers to Discharge: No Barriers Identified     Means of departure: Not know       Patient Contact and Communications Key Contact 1: Patient Key Contact 2: Spouse Spoke with: Patient and spouse Contact Date: 06/15/23,   Contact time: 1000        CMS Medicare.gov Compare Post Acute Care list provided to:: Patient Represenative (must comment) (Spouse) Choice offered to / list presented to : Spouse  Admission diagnosis:  fall Patient Active Problem List   Diagnosis Date Noted   Finger laceration 04/15/2023   Forearm laceration 04/15/2023   Weakness of both arms 02/05/2023   Paresthesia 11/04/2022   Encounter for preoperative assessment 10/25/2022   Acute post-traumatic headache, not intractable 10/23/2022   Osteoarthritis of left hip 09/29/2022   Osteoarthritis of right hip 09/29/2022   Pain in joint of right hip 09/24/2022   Pain of left hip joint 09/24/2022   Corn of foot 07/31/2022   Somnolence, daytime 02/24/2022   Fall 01/31/2022   Orthostatic hypotension 01/18/2022   Difficulty walking with leg weakness  01/10/2022   Avascular necrosis of bones of both hips with severe  OA of bilateral hips  01/10/2022   Bilateral lower extremity edema 01/10/2022   Leg weakness, bilateral 08/22/2021   Spinal stenosis 03/27/2021   Lumbar radiculopathy 03/12/2021   Acute left-sided low back pain with left-sided sciatica 02/21/2021   Insomnia 10/28/2018   Bradycardia 10/09/2016   Sebaceous cyst 10/09/2016   Band keratopathy of right eye 07/02/2016   Corneal dellen of right eye 07/02/2016   Carotid bruit 04/08/2016   Acute sinusitis 11/01/2015   Elevated PSA 04/10/2015   Well adult exam 10/10/2014   Bruising 10/10/2014   PCO (posterior capsular opacification) 10/27/2013   Nuclear cataract 07/29/2012   Pseudophakia 07/29/2012   CLL (chronic lymphocytic leukemia) (HCC) 06/09/2012   Cystoid macular edema 06/03/2012   Epiretinal membrane 06/03/2012   Edema 02/07/2012   Pain of right lower leg 02/07/2012   Dyslipidemia 09/05/2007   SLEEP APNEA, OBSTRUCTIVE 09/05/2007   OSTEOARTHRITIS 09/05/2007   Essential hypertension 07/25/2007   PCP:  Tresa Garter, MD Pharmacy:   [facility] [id number] - , [place] - [address] AT [address] [facility] [address] [place] [address] Phone: [phone] Fax: [phone]

## 2023-06-15 NOTE — Progress Notes (Addendum)
PT recommended SNF. Pt has Medicare A/B with [facility] waiver. Will fax pt out to [facility] SNFs. Bed offers pending. TOC anticipates delays due to it being the weekend.

## 2023-06-15 NOTE — Evaluation (Signed)
Physical Therapy Evaluation Patient Details Name: Jerry Gomez MRN: [id number] DOB: April 07, 1931 Today's Date: 06/15/2023  History of Present Illness  Patient is a 89 year old male with a history of hypertension, CLL, osteoarthritis presenting to the ED with falls at home and weakness.  Clinical Impression  Pt admitted with above diagnosis.  Pt currently with functional limitations due to the deficits listed below (see PT Problem List). Pt will benefit from acute skilled PT to increase their independence and safety with mobility to allow discharge.  Pt seen in ED.  Pt assisted with transfer to/from [facility].  Pt requiring mod-max assist +2 for mobility at this time.  Spouse is unable to physically assist pt at home.  Pt would benefit from post acute rehab upon d/c.           Assistance Recommended at Discharge Frequent or constant Supervision/Assistance  If plan is discharge home, recommend the following:  Can travel by private vehicle  A lot of help with walking and/or transfers;A lot of help with bathing/dressing/bathroom;Help with stairs or ramp for entrance   No    Equipment Recommendations None recommended by PT  Recommendations for Other Services       Functional Status Assessment Patient has had a recent decline in their functional status and demonstrates the ability to make significant improvements in function in a reasonable and predictable amount of time.     Precautions / Restrictions Precautions Precautions: Fall Precaution Comments: severe bil hip OA      Mobility  Bed Mobility Overal bed mobility: Needs Assistance Bed Mobility: Supine to Sit, Sit to Supine     Supine to sit: Max assist, +2 for physical assistance Sit to supine: Max assist, +2 for physical assistance   General bed mobility comments: assist for lower body, assist for trunk upright    Transfers Overall transfer level: Needs assistance Equipment used: Rolling walker (2 wheels) Transfers: Sit  to/from Stand, Bed to chair/wheelchair/BSC Sit to Stand: Mod assist, +2 physical assistance, +2 safety/equipment Stand pivot transfers: Mod assist, +2 physical assistance, +2 safety/equipment         General transfer comment: pt mosly maintaining weight on right LE due to left leg pain, tends to kept LEs crossed despite cues (but also with chronic bil hip OA - per spouse pt unable to have sx); assist for rise, steady and controlling descent, pt with great difficulty weight shifting and only able to perform transfers today    Ambulation/Gait                  Stairs            Wheelchair Mobility     Tilt Bed    Modified Rankin (Stroke Patients Only)       Balance Overall balance assessment: History of Falls                                           Pertinent Vitals/Pain Pain Assessment Pain Assessment: Faces Faces Pain Scale: Hurts little more Pain Location: bil hip (OA) Pain Descriptors / Indicators: Grimacing Pain Intervention(s): Repositioned, Monitored during session    Home Living Family/patient expects to be discharged to:: Private residence Living Arrangements: Spouse/significant other   Type of Home: House       Alternate Level Stairs-Number of Steps: has stair chair lift Home Layout: Two level;Bed/bath upstairs Home Equipment: Rolling  Walker (2 wheels);Cane - single point;BSC/3in1      Prior Function Prior Level of Function : Independent/Modified Independent             Mobility Comments: ambulatory around home with RW ADLs Comments: pt typically able to perform basic ADLs, BSC over toilet     Hand Dominance        Extremity/Trunk Assessment        Lower Extremity Assessment Lower Extremity Assessment: Generalized weakness (edema in lower legs observed, also audible crepitus of bil hips OA)    Cervical / Trunk Assessment Cervical / Trunk Assessment: Kyphotic  Communication   Communication: HOH   Cognition Arousal/Alertness: Awake/alert Behavior During Therapy: WFL for tasks assessed/performed                                   General Comments: pt not able to accurately report PLOF, wife had to correct him, otherwise follows simple commands        General Comments      Exercises     Assessment/Plan    PT Assessment Patient needs continued PT services  PT Problem List Decreased strength;Decreased mobility;Decreased activity tolerance;Decreased balance;Decreased knowledge of use of DME       PT Treatment Interventions Gait training;DME instruction;Therapeutic exercise;Balance training;Functional mobility training;Therapeutic activities;Patient/family education;Wheelchair mobility training    PT Goals (Current goals can be found in the Care Plan section)  Acute Rehab PT Goals PT Goal Formulation: With patient Time For Goal Achievement: 06/29/23 Potential to Achieve Goals: Good    Frequency Min 1X/week     Co-evaluation               AM-PAC PT "6 Clicks" Mobility  Outcome Measure Help needed turning from your back to your side while in a flat bed without using bedrails?: A Lot Help needed moving from lying on your back to sitting on the side of a flat bed without using bedrails?: A Lot Help needed moving to and from a bed to a chair (including a wheelchair)?: A Lot Help needed standing up from a chair using your arms (e.g., wheelchair or bedside chair)?: A Lot Help needed to walk in hospital room?: A Lot Help needed climbing 3-5 steps with a railing? : Total 6 Click Score: 11    End of Session Equipment Utilized During Treatment: Gait belt Activity Tolerance: Patient tolerated treatment well Patient left: in bed;with call bell/phone within reach;with family/visitor present   PT Visit Diagnosis: Difficulty in walking, not elsewhere classified (R26.2);Muscle weakness (generalized) (M62.81);Adult, failure to thrive (R62.7)    Time:  1007-1026 PT Time Calculation (min) (ACUTE ONLY): 19 min   Charges:   PT Evaluation $PT Eval Low Complexity: 1 Low   PT General Charges $$ ACUTE PT VISIT: 1 Visit        Thomasene Mohair PT, DPT Physical Therapist Acute Rehabilitation Services Office: [phone]   Kati L Payson 06/15/2023, 11:08 AM

## 2023-06-15 NOTE — NC FL2 (Signed)
[place] MEDICAID FL2 LEVEL OF CARE FORM     IDENTIFICATION  Patient Name: Jerry Gomez Birthdate: [birth year] Sex: male Admission Date (Current Location): 06/14/2023  [facility] and [place] Number:  Producer, television/film/video and Address:  [facility],  [address] [place], [place] [address]      Provider Number: [id number]  Attending Physician Name and Address:  Default, Provider, MD  Relative Name and Phone Number:  Trevyn, Calderon (Spouse) [phone]    Current Level of Care: Hospital Recommended Level of Care: Skilled Nursing Facility Prior Approval Number:    Date Approved/Denied:   PASRR Number: [id number]  Discharge Plan: SNF    Current Diagnoses: Patient Active Problem List   Diagnosis Date Noted   Finger laceration 04/15/2023   Forearm laceration 04/15/2023   Weakness of both arms 02/05/2023   Paresthesia 11/04/2022   Encounter for preoperative assessment 10/25/2022   Acute post-traumatic headache, not intractable 10/23/2022   Osteoarthritis of left hip 09/29/2022   Osteoarthritis of right hip 09/29/2022   Pain in joint of right hip 09/24/2022   Pain of left hip joint 09/24/2022   Corn of foot 07/31/2022   Somnolence, daytime 02/24/2022   Fall 01/31/2022   Orthostatic hypotension 01/18/2022   Difficulty walking with leg weakness  01/10/2022   Avascular necrosis of bones of both hips with severe OA of bilateral hips  01/10/2022   Bilateral lower extremity edema 01/10/2022   Leg weakness, bilateral 08/22/2021   Spinal stenosis 03/27/2021   Lumbar radiculopathy 03/12/2021   Acute left-sided low back pain with left-sided sciatica 02/21/2021   Insomnia 10/28/2018   Bradycardia 10/09/2016   Sebaceous cyst 10/09/2016   Band keratopathy of right eye 07/02/2016   Corneal dellen of right eye 07/02/2016   Carotid bruit 04/08/2016   Acute sinusitis 11/01/2015   Elevated PSA 04/10/2015   Well adult exam 10/10/2014   Bruising 10/10/2014   PCO (posterior  capsular opacification) 10/27/2013   Nuclear cataract 07/29/2012   Pseudophakia 07/29/2012   CLL (chronic lymphocytic leukemia) (HCC) 06/09/2012   Cystoid macular edema 06/03/2012   Epiretinal membrane 06/03/2012   Edema 02/07/2012   Pain of right lower leg 02/07/2012   Dyslipidemia 09/05/2007   SLEEP APNEA, OBSTRUCTIVE 09/05/2007   OSTEOARTHRITIS 09/05/2007   Essential hypertension 07/25/2007    Orientation RESPIRATION BLADDER Height & Weight     Self, Time, Situation, Place  Normal Continent Weight:   Height:     BEHAVIORAL SYMPTOMS/MOOD NEUROLOGICAL BOWEL NUTRITION STATUS      Continent Diet (Regular)  AMBULATORY STATUS COMMUNICATION OF NEEDS Skin   Extensive Assist Verbally Bruising (Hands/arm)                       Personal Care Assistance Level of Assistance  Bathing, Feeding, Dressing Bathing Assistance: Maximum assistance Feeding assistance: Limited assistance Dressing Assistance: Maximum assistance     Functional Limitations Info  Sight, Hearing, Speech Sight Info: Adequate Hearing Info: Adequate Speech Info: Adequate    SPECIAL CARE FACTORS FREQUENCY  PT (By licensed PT), OT (By licensed OT)     PT Frequency: x5/week OT Frequency: x5/week            Contractures Contractures Info: Not present    Additional Factors Info  Code Status, Allergies Code Status Info: Full Allergies Info: No Known Allergies           Current Medications (06/15/2023):  This is the current hospital active medication list Current Facility-Administered Medications  Medication Dose Route Frequency Provider Last Rate Last Admin   aspirin EC tablet 81 mg  81 mg Oral Daily Rancour, Stephen, MD       furosemide (LASIX) tablet 20 mg  20 mg Oral Daily Rancour, Stephen, MD       Current Outpatient Medications  Medication Sig Dispense Refill   Artificial Tear Ointment (ARTIFICIAL TEARS) ointment Place 1 drop into both eyes daily as needed (dry eyes).     aspirin 81 MG EC  tablet Take 81 mg by mouth daily. Swallow whole.     B Complex-Folic Acid (B COMPLEX PLUS) TABS Take 1 tablet by mouth every other day. 100 tablet 3   Cholecalciferol (VITAMIN D) 2000 units tablet Take 2,000 Units by mouth daily.     Ensure (ENSURE) Take 237 mLs by mouth daily.     furosemide (LASIX) 20 MG tablet TAKE 1 TABLET BY MOUTH EVERY DAY (Patient taking differently: Take 20 mg by mouth every other day.) 90 tablet 1   Multiple Vitamins-Minerals (MULTIVITAMIN ADULT) CHEW Take one qod 100 tablet 3   vitamin B-12 (CYANOCOBALAMIN) 500 MCG tablet Take 500 mcg by mouth daily.     cephALEXin (KEFLEX) 500 MG capsule Take 1 capsule (500 mg total) by mouth 4 (four) times daily. (Patient not taking: Reported on 05/21/2023) 20 capsule 0   COVID-19 mRNA vaccine 2023-2024 (COMIRNATY) syringe Inject into the muscle. 0.3 mL 0   mupirocin ointment (BACTROBAN) 2 % On leg wound w/dressing change qd or bid (Patient not taking: Reported on 06/15/2023) 30 g 0     Discharge Medications: Please see discharge summary for a list of discharge medications.  Relevant Imaging Results:  Relevant Lab Results:   Additional Information SSN: [id number]  Princella Ion, LCSW

## 2023-06-16 ENCOUNTER — Ambulatory Visit (HOSPITAL_BASED_OUTPATIENT_CLINIC_OR_DEPARTMENT_OTHER): Payer: Medicare Other | Admitting: Physical Therapy

## 2023-06-16 DIAGNOSIS — M1990 Unspecified osteoarthritis, unspecified site: Secondary | ICD-10-CM | POA: Diagnosis not present

## 2023-06-16 DIAGNOSIS — W19XXXA Unspecified fall, initial encounter: Secondary | ICD-10-CM | POA: Diagnosis not present

## 2023-06-16 DIAGNOSIS — F0390 Unspecified dementia without behavioral disturbance: Secondary | ICD-10-CM | POA: Diagnosis not present

## 2023-06-16 DIAGNOSIS — E559 Vitamin D deficiency, unspecified: Secondary | ICD-10-CM | POA: Diagnosis not present

## 2023-06-16 DIAGNOSIS — C9480 Other specified leukemias not having achieved remission: Secondary | ICD-10-CM | POA: Diagnosis not present

## 2023-06-16 DIAGNOSIS — R600 Localized edema: Secondary | ICD-10-CM | POA: Diagnosis not present

## 2023-06-16 DIAGNOSIS — R41841 Cognitive communication deficit: Secondary | ICD-10-CM | POA: Diagnosis not present

## 2023-06-16 DIAGNOSIS — Z9181 History of falling: Secondary | ICD-10-CM | POA: Diagnosis not present

## 2023-06-16 DIAGNOSIS — R4182 Altered mental status, unspecified: Secondary | ICD-10-CM | POA: Diagnosis not present

## 2023-06-16 DIAGNOSIS — L988 Other specified disorders of the skin and subcutaneous tissue: Secondary | ICD-10-CM | POA: Diagnosis not present

## 2023-06-16 DIAGNOSIS — R2681 Unsteadiness on feet: Secondary | ICD-10-CM | POA: Diagnosis not present

## 2023-06-16 DIAGNOSIS — Z743 Need for continuous supervision: Secondary | ICD-10-CM | POA: Diagnosis not present

## 2023-06-16 DIAGNOSIS — R531 Weakness: Secondary | ICD-10-CM | POA: Diagnosis not present

## 2023-06-16 DIAGNOSIS — M71011 Abscess of bursa, right shoulder: Secondary | ICD-10-CM | POA: Diagnosis not present

## 2023-06-16 DIAGNOSIS — R2689 Other abnormalities of gait and mobility: Secondary | ICD-10-CM | POA: Diagnosis not present

## 2023-06-16 DIAGNOSIS — M6281 Muscle weakness (generalized): Secondary | ICD-10-CM | POA: Diagnosis not present

## 2023-06-16 NOTE — ED Provider Notes (Addendum)
Emergency Medicine Observation Re-evaluation Note  Jerry Gomez is a 89 y.o. male, seen on rounds today.  Pt initially presented to the ED for complaints of Fall Currently, the patient is awaiting placement.  Physical Exam  BP 129/60 (BP Location: Left Arm)   Pulse 80   Temp 98.2 F (36.8 C) (Oral)   Resp 14   Ht 5\' 7"  (1.702 m)   Wt 60.7 kg   SpO2 96%   BMI 20.96 kg/m  Physical Exam General: Calm. Confused at times.  Cardiac: well perfused.  Lungs: even respirations Psych: Confused at times but redirectable.   ED Course / MDM  EKG:EKG Interpretation Date/Time:  Sunday June 15 2023 01:42:40 EDT Ventricular Rate:  95 PR Interval:  180 QRS Duration:  92 QT Interval:  347 QTC Calculation: 437 R Axis:   5  Text Interpretation: Sinus rhythm Abnormal R-wave progression, early transition No significant change was found Confirmed by Glynn Octave [phone]) on 06/15/2023 1:44:58 AM  I have reviewed the labs performed to date as well as medications administered while in observation.  Recent changes in the last 24 hours include TOC placement pending.  10:56 AM  [facility] to [facility]. Stable for discharge  Plan  Current plan is for placement.    Maia Plan, MD 06/16/23 [id number]    Maia Plan, MD 06/16/23 1057

## 2023-06-16 NOTE — Progress Notes (Addendum)
Awaiting response from [facility] which is family's preferred.   Addend @ 9:23 AM Presented bed offers. Spouse accepted [facility]. Notified Nikki. Will email [facility]. Nikki to inform of admission time.  Addend @ 9:39 AM [facility] post-acute confirmed pt is eligible.

## 2023-06-16 NOTE — ED Notes (Addendum)
Pt has had several periods of calling out in the night for help, pt unable to remember to use call bell for assistance even with redirection. Pt had episode of urinary incontinence in the bed, and with bed alarm sounding, pt found with his body at the foot of the bed and feet hanging over near the floor despite rapid staff presence to assist pt. Since condom catheter put in place pt still requesting help to the bedside commode at times to urinate but pt able to be redirected to urinate with condom catheter in place. Pt pleasant and cooperative. Pt exhibits severe anxiety and severely stiff even to get pt to roll on his side in bed took several prompting attempts as pt reported he was so afraid to fall. Pt was not able to stand on his own when cleaned after incontinent episode earlier in the shift, was assisted to stand x2 staff members and max assistance, pt digging nails into the back of this nurse's arm as he repeated "I'm falling! I'm falling!" Attempts made to verbally reassure pt he was in fact safe and was not falling but pt resisting against x2 staff member assist to stand, so condom catheter reasonable to place in order to decrease pt anxiety regarding fear of falling. Pt repositioning himself in the bed. Pt alert to self, place, some details of situation, confused to details of time and some details of situation.

## 2023-06-16 NOTE — Progress Notes (Signed)
Notified EDP and EMT of discharge. Report info given to EMT. [facility] to transport. SNF will receive pt after 1 PM.

## 2023-06-16 NOTE — ED Notes (Signed)
Called report to Environmental education officer at [facility]

## 2023-06-17 DIAGNOSIS — M6281 Muscle weakness (generalized): Secondary | ICD-10-CM | POA: Diagnosis not present

## 2023-06-17 LAB — PATHOLOGIST SMEAR REVIEW

## 2023-06-18 ENCOUNTER — Other Ambulatory Visit: Payer: Self-pay | Admitting: *Deleted

## 2023-06-18 NOTE — Patient Outreach (Signed)
Mr. Lachney recently admitted to [facility] skilled nursing facility under the [facility] ACO [place] waiver. Screening for potential [facility] care coordination services as benefit of health plan and Primary Care Provider.   Facility site visit to [facility]. Met with Mr. Martinezlopez at bedside. Mr. Clubb reports he is from home with spouse. States he plans to return home. Endorses he has had multiple falls at home. Reports he has wheelchair, rollator, and chair lift to get upstairs in his home. Reports his bedroom is on the second floor. Encouraged Mr. Mayberry to use his call bell to call for assistance while in SNF.   Explained care coordination services. Provided program literature and writer's contact information. Will continue to follow.   Made [place] social worker aware Clinical research associate is following.   Raiford Noble, MSN, RN,BSN [facility] Post Acute Care Coordinator [phone] (Direct dial)

## 2023-06-19 DIAGNOSIS — M6281 Muscle weakness (generalized): Secondary | ICD-10-CM | POA: Diagnosis not present

## 2023-06-19 DIAGNOSIS — R2681 Unsteadiness on feet: Secondary | ICD-10-CM | POA: Diagnosis not present

## 2023-06-19 DIAGNOSIS — R41841 Cognitive communication deficit: Secondary | ICD-10-CM | POA: Diagnosis not present

## 2023-06-19 DIAGNOSIS — R2689 Other abnormalities of gait and mobility: Secondary | ICD-10-CM | POA: Diagnosis not present

## 2023-06-19 DIAGNOSIS — R600 Localized edema: Secondary | ICD-10-CM | POA: Diagnosis not present

## 2023-06-19 DIAGNOSIS — F0390 Unspecified dementia without behavioral disturbance: Secondary | ICD-10-CM | POA: Diagnosis not present

## 2023-06-20 DIAGNOSIS — Z9181 History of falling: Secondary | ICD-10-CM | POA: Diagnosis not present

## 2023-06-20 DIAGNOSIS — M6281 Muscle weakness (generalized): Secondary | ICD-10-CM | POA: Diagnosis not present

## 2023-06-20 DIAGNOSIS — R600 Localized edema: Secondary | ICD-10-CM | POA: Diagnosis not present

## 2023-06-22 ENCOUNTER — Other Ambulatory Visit: Payer: Medicare Other

## 2023-06-22 DIAGNOSIS — R600 Localized edema: Secondary | ICD-10-CM | POA: Diagnosis not present

## 2023-06-22 DIAGNOSIS — M6281 Muscle weakness (generalized): Secondary | ICD-10-CM | POA: Diagnosis not present

## 2023-06-22 DIAGNOSIS — E559 Vitamin D deficiency, unspecified: Secondary | ICD-10-CM | POA: Diagnosis not present

## 2023-06-23 DIAGNOSIS — R2689 Other abnormalities of gait and mobility: Secondary | ICD-10-CM | POA: Diagnosis not present

## 2023-06-23 DIAGNOSIS — R41841 Cognitive communication deficit: Secondary | ICD-10-CM | POA: Diagnosis not present

## 2023-06-23 DIAGNOSIS — M6281 Muscle weakness (generalized): Secondary | ICD-10-CM | POA: Diagnosis not present

## 2023-06-23 DIAGNOSIS — R600 Localized edema: Secondary | ICD-10-CM | POA: Diagnosis not present

## 2023-06-23 DIAGNOSIS — F0390 Unspecified dementia without behavioral disturbance: Secondary | ICD-10-CM | POA: Diagnosis not present

## 2023-06-23 DIAGNOSIS — R2681 Unsteadiness on feet: Secondary | ICD-10-CM | POA: Diagnosis not present

## 2023-06-24 ENCOUNTER — Other Ambulatory Visit: Payer: Self-pay | Admitting: *Deleted

## 2023-06-24 NOTE — Patient Outreach (Signed)
Mr.Wiater resides in [facility] skilled nursing facility. Screening for potential care coordination services as benefit of health plan and Primary Care Provider.   Update received from [facility] [place] Child psychotherapist. Family meeting scheduled for today.   Will continue to follow.   Raiford Noble, MSN, RN,BSN [facility] Post Acute Care Coordinator [phone] (Direct dial)

## 2023-06-26 DIAGNOSIS — F0390 Unspecified dementia without behavioral disturbance: Secondary | ICD-10-CM | POA: Diagnosis not present

## 2023-06-26 DIAGNOSIS — R2689 Other abnormalities of gait and mobility: Secondary | ICD-10-CM | POA: Diagnosis not present

## 2023-06-26 DIAGNOSIS — R2681 Unsteadiness on feet: Secondary | ICD-10-CM | POA: Diagnosis not present

## 2023-06-26 DIAGNOSIS — R600 Localized edema: Secondary | ICD-10-CM | POA: Diagnosis not present

## 2023-06-26 DIAGNOSIS — M6281 Muscle weakness (generalized): Secondary | ICD-10-CM | POA: Diagnosis not present

## 2023-06-26 DIAGNOSIS — R41841 Cognitive communication deficit: Secondary | ICD-10-CM | POA: Diagnosis not present

## 2023-06-30 DIAGNOSIS — R2689 Other abnormalities of gait and mobility: Secondary | ICD-10-CM | POA: Diagnosis not present

## 2023-06-30 DIAGNOSIS — R600 Localized edema: Secondary | ICD-10-CM | POA: Diagnosis not present

## 2023-06-30 DIAGNOSIS — R2681 Unsteadiness on feet: Secondary | ICD-10-CM | POA: Diagnosis not present

## 2023-06-30 DIAGNOSIS — F0390 Unspecified dementia without behavioral disturbance: Secondary | ICD-10-CM | POA: Diagnosis not present

## 2023-06-30 DIAGNOSIS — R41841 Cognitive communication deficit: Secondary | ICD-10-CM | POA: Diagnosis not present

## 2023-06-30 DIAGNOSIS — M6281 Muscle weakness (generalized): Secondary | ICD-10-CM | POA: Diagnosis not present

## 2023-07-02 DIAGNOSIS — R600 Localized edema: Secondary | ICD-10-CM | POA: Diagnosis not present

## 2023-07-02 DIAGNOSIS — M1990 Unspecified osteoarthritis, unspecified site: Secondary | ICD-10-CM | POA: Diagnosis not present

## 2023-07-03 DIAGNOSIS — M6281 Muscle weakness (generalized): Secondary | ICD-10-CM | POA: Diagnosis not present

## 2023-07-03 DIAGNOSIS — R41841 Cognitive communication deficit: Secondary | ICD-10-CM | POA: Diagnosis not present

## 2023-07-03 DIAGNOSIS — R2689 Other abnormalities of gait and mobility: Secondary | ICD-10-CM | POA: Diagnosis not present

## 2023-07-03 DIAGNOSIS — R600 Localized edema: Secondary | ICD-10-CM | POA: Diagnosis not present

## 2023-07-03 DIAGNOSIS — F0390 Unspecified dementia without behavioral disturbance: Secondary | ICD-10-CM | POA: Diagnosis not present

## 2023-07-03 DIAGNOSIS — R2681 Unsteadiness on feet: Secondary | ICD-10-CM | POA: Diagnosis not present

## 2023-07-04 DIAGNOSIS — M71011 Abscess of bursa, right shoulder: Secondary | ICD-10-CM | POA: Diagnosis not present

## 2023-07-07 DIAGNOSIS — F0390 Unspecified dementia without behavioral disturbance: Secondary | ICD-10-CM | POA: Diagnosis not present

## 2023-07-07 DIAGNOSIS — R2681 Unsteadiness on feet: Secondary | ICD-10-CM | POA: Diagnosis not present

## 2023-07-07 DIAGNOSIS — L988 Other specified disorders of the skin and subcutaneous tissue: Secondary | ICD-10-CM | POA: Diagnosis not present

## 2023-07-07 DIAGNOSIS — R2689 Other abnormalities of gait and mobility: Secondary | ICD-10-CM | POA: Diagnosis not present

## 2023-07-07 DIAGNOSIS — R41841 Cognitive communication deficit: Secondary | ICD-10-CM | POA: Diagnosis not present

## 2023-07-07 DIAGNOSIS — R600 Localized edema: Secondary | ICD-10-CM | POA: Diagnosis not present

## 2023-07-07 DIAGNOSIS — M6281 Muscle weakness (generalized): Secondary | ICD-10-CM | POA: Diagnosis not present

## 2023-07-07 DIAGNOSIS — E559 Vitamin D deficiency, unspecified: Secondary | ICD-10-CM | POA: Diagnosis not present

## 2023-07-13 DIAGNOSIS — E538 Deficiency of other specified B group vitamins: Secondary | ICD-10-CM | POA: Diagnosis not present

## 2023-07-13 DIAGNOSIS — I10 Essential (primary) hypertension: Secondary | ICD-10-CM | POA: Diagnosis not present

## 2023-07-13 DIAGNOSIS — E559 Vitamin D deficiency, unspecified: Secondary | ICD-10-CM | POA: Diagnosis not present

## 2023-07-13 DIAGNOSIS — M1990 Unspecified osteoarthritis, unspecified site: Secondary | ICD-10-CM | POA: Diagnosis not present

## 2023-07-13 DIAGNOSIS — Z7982 Long term (current) use of aspirin: Secondary | ICD-10-CM | POA: Diagnosis not present

## 2023-07-13 DIAGNOSIS — C9510 Chronic leukemia of unspecified cell type not having achieved remission: Secondary | ICD-10-CM | POA: Diagnosis not present

## 2023-07-13 DIAGNOSIS — M71011 Abscess of bursa, right shoulder: Secondary | ICD-10-CM | POA: Diagnosis not present

## 2023-07-13 DIAGNOSIS — Z9181 History of falling: Secondary | ICD-10-CM | POA: Diagnosis not present

## 2023-07-14 ENCOUNTER — Encounter: Payer: Self-pay | Admitting: Internal Medicine

## 2023-07-14 ENCOUNTER — Telehealth: Payer: Self-pay | Admitting: Internal Medicine

## 2023-07-14 NOTE — Telephone Encounter (Signed)
Tonya from [facility]  Nursing for womb 2 week 1 1 week 3  Abscess on right should and it's still is very red. Heart parameter 55 to 100 nurse thinks he is not drinking enough water   Best call back [phone]

## 2023-07-15 ENCOUNTER — Other Ambulatory Visit: Payer: Self-pay | Admitting: *Deleted

## 2023-07-15 ENCOUNTER — Telehealth: Payer: Self-pay | Admitting: Internal Medicine

## 2023-07-15 ENCOUNTER — Other Ambulatory Visit (INDEPENDENT_AMBULATORY_CARE_PROVIDER_SITE_OTHER): Payer: Medicare Other

## 2023-07-15 DIAGNOSIS — M1990 Unspecified osteoarthritis, unspecified site: Secondary | ICD-10-CM | POA: Diagnosis not present

## 2023-07-15 DIAGNOSIS — I10 Essential (primary) hypertension: Secondary | ICD-10-CM

## 2023-07-15 DIAGNOSIS — R350 Frequency of micturition: Secondary | ICD-10-CM

## 2023-07-15 DIAGNOSIS — E559 Vitamin D deficiency, unspecified: Secondary | ICD-10-CM | POA: Diagnosis not present

## 2023-07-15 DIAGNOSIS — E538 Deficiency of other specified B group vitamins: Secondary | ICD-10-CM | POA: Diagnosis not present

## 2023-07-15 DIAGNOSIS — M71011 Abscess of bursa, right shoulder: Secondary | ICD-10-CM | POA: Diagnosis not present

## 2023-07-15 DIAGNOSIS — C9510 Chronic leukemia of unspecified cell type not having achieved remission: Secondary | ICD-10-CM | POA: Diagnosis not present

## 2023-07-15 LAB — URINALYSIS, ROUTINE W REFLEX MICROSCOPIC
Bilirubin Urine: NEGATIVE
Hgb urine dipstick: NEGATIVE
Ketones, ur: NEGATIVE
Leukocytes,Ua: NEGATIVE
Nitrite: NEGATIVE
RBC / HPF: NONE SEEN (ref 0–?)
Specific Gravity, Urine: >=1.03 — AB (ref 1.000–1.030)
Total Protein, Urine: NEGATIVE
Urine Glucose: NEGATIVE
Urobilinogen, UA: 0.2 (ref 0.0–1.0)
pH: 6 (ref 5.0–8.0)

## 2023-07-15 NOTE — Telephone Encounter (Signed)
Pt son called inquiring about if its okay for pt to Home health Nurse could collect his urine in a sterile cup and drop it off at the lab. Pt son so stated pt urinated about 11 times last night and he is uncomfortable. Pt has a appt tomorrow but was wanting to get a jump start on this situation please advise.

## 2023-07-15 NOTE — Telephone Encounter (Signed)
Notified Tonya w/MD response.Marland KitchenJohny Chess

## 2023-07-15 NOTE — Telephone Encounter (Signed)
Okay. Thank you.

## 2023-07-15 NOTE — Patient Outreach (Signed)
Per [facility] Jerry Gomez discharged from [facility] skilled nursing facility on 07/09/23 with [facility]  Screening for potential care coordination services as benefit of health plan and Primary Care Provider.  Writer previously spoke with Jerry Gomez at bedside about care coordination services.   Referral for care coordination services.    Raiford Noble, MSN, RN,BSN [facility] Post Acute Care Coordinator [phone] (Direct dial)

## 2023-07-15 NOTE — Telephone Encounter (Signed)
Called son [employer] analyst) inform will put order in to have UA today. Pt see MD on tomorrow.Marland KitchenRaechel Chute

## 2023-07-16 ENCOUNTER — Telehealth: Payer: Self-pay | Admitting: *Deleted

## 2023-07-16 ENCOUNTER — Ambulatory Visit (INDEPENDENT_AMBULATORY_CARE_PROVIDER_SITE_OTHER): Payer: Medicare Other | Admitting: Internal Medicine

## 2023-07-16 ENCOUNTER — Encounter: Payer: Self-pay | Admitting: Internal Medicine

## 2023-07-16 VITALS — BP 100/60 | HR 62 | Temp 98.6°F | Ht 67.0 in

## 2023-07-16 DIAGNOSIS — L0292 Furuncle, unspecified: Secondary | ICD-10-CM | POA: Diagnosis not present

## 2023-07-16 DIAGNOSIS — R262 Difficulty in walking, not elsewhere classified: Secondary | ICD-10-CM | POA: Diagnosis not present

## 2023-07-16 DIAGNOSIS — R351 Nocturia: Secondary | ICD-10-CM | POA: Diagnosis not present

## 2023-07-16 DIAGNOSIS — R600 Localized edema: Secondary | ICD-10-CM

## 2023-07-16 DIAGNOSIS — E559 Vitamin D deficiency, unspecified: Secondary | ICD-10-CM | POA: Diagnosis not present

## 2023-07-16 DIAGNOSIS — C9110 Chronic lymphocytic leukemia of B-cell type not having achieved remission: Secondary | ICD-10-CM

## 2023-07-16 DIAGNOSIS — R350 Frequency of micturition: Secondary | ICD-10-CM | POA: Insufficient documentation

## 2023-07-16 DIAGNOSIS — C9510 Chronic leukemia of unspecified cell type not having achieved remission: Secondary | ICD-10-CM | POA: Diagnosis not present

## 2023-07-16 DIAGNOSIS — I10 Essential (primary) hypertension: Secondary | ICD-10-CM | POA: Diagnosis not present

## 2023-07-16 DIAGNOSIS — M1990 Unspecified osteoarthritis, unspecified site: Secondary | ICD-10-CM | POA: Diagnosis not present

## 2023-07-16 DIAGNOSIS — M71011 Abscess of bursa, right shoulder: Secondary | ICD-10-CM | POA: Diagnosis not present

## 2023-07-16 DIAGNOSIS — E538 Deficiency of other specified B group vitamins: Secondary | ICD-10-CM | POA: Diagnosis not present

## 2023-07-16 MED ORDER — GEMTESA 75 MG PO TABS: 75.0000 mg | ORAL_TABLET | Freq: Every day | ORAL | 5 refills | Status: DC

## 2023-07-16 NOTE — Progress Notes (Signed)
Subjective:  Patient ID: Jerry Gomez, male    DOB: 1931/04/10  Age: 89 y.o. MRN: [id number]  CC: Follow-up (Rehab f/u, hip and legs weakness. Frequency with urination during the night, discuss lasix, fluid in legs and wound on back.)   HPI Jerry Gomez presents for leg edema, gait disorder, peeing a lot at night   Pts son has stated "Problems to address:   1) Dad is getting up many times a night with caregiver assistance (9 times in one night this past Saturday) -- UTI? He takes Lasix in the morning.  2) open sore on back; Medicare nurse ordered antibiotics which he starts Tuesday 8/6 I believe; not sure what antibiotic.  3) fluid in legs should be better but had gotten very bad causing fall on 7/6.  He now has a Pharmacologist in which he can elevate his legs.  4) we need prescription for home health care.  If it can be dated 07/09/23 that would help with finances  5) I plan to get a mobile scooter to allow him to drive out of the house (ramp is already in place).  A prescription for a mobile scooter would help with Medicare costs.   Outpatient Medications Prior to Visit  Medication Sig Dispense Refill   Artificial Tear Ointment (ARTIFICIAL TEARS) ointment Place 1 drop into both eyes daily as needed (dry eyes).     aspirin 81 MG EC tablet Take 81 mg by mouth daily. Swallow whole.     B Complex-Folic Acid (B COMPLEX PLUS) TABS Take 1 tablet by mouth every other day. 100 tablet 3   Cholecalciferol (VITAMIN D) 2000 units tablet Take 2,000 Units by mouth daily.     COVID-19 mRNA vaccine 2023-2024 (COMIRNATY) syringe Inject into the muscle. 0.3 mL 0   doxycycline (VIBRAMYCIN) 100 MG capsule Take by mouth.     Ensure (ENSURE) Take 237 mLs by mouth daily.     furosemide (LASIX) 20 MG tablet TAKE 1 TABLET BY MOUTH EVERY DAY (Patient taking differently: Take 20 mg by mouth every other day.) 90 tablet 1   Multiple Vitamins-Minerals (MULTIVITAMIN ADULT) CHEW Take one qod 100 tablet 3    vitamin B-12 (CYANOCOBALAMIN) 500 MCG tablet Take 500 mcg by mouth daily.     mupirocin ointment (BACTROBAN) 2 % On leg wound w/dressing change qd or bid (Patient not taking: Reported on 06/15/2023) 30 g 0   cephALEXin (KEFLEX) 500 MG capsule Take 1 capsule (500 mg total) by mouth 4 (four) times daily. (Patient not taking: Reported on 05/21/2023) 20 capsule 0   No facility-administered medications prior to visit.    ROS: Review of Systems  Constitutional:  Positive for fatigue. Negative for appetite change and unexpected weight change.  HENT:  Negative for congestion, nosebleeds, sneezing, sore throat and trouble swallowing.   Eyes:  Negative for itching and visual disturbance.  Respiratory:  Negative for cough.   Cardiovascular:  Negative for chest pain, palpitations and leg swelling.  Gastrointestinal:  Negative for abdominal distention, blood in stool, diarrhea and nausea.  Genitourinary:  Positive for urgency. Negative for frequency and hematuria.  Musculoskeletal:  Negative for back pain, gait problem, joint swelling and neck pain.  Skin:  Negative for rash.  Neurological:  Positive for dizziness, weakness and light-headedness. Negative for tremors and speech difficulty.  Psychiatric/Behavioral:  Negative for agitation, confusion, dysphoric mood and sleep disturbance. The patient is not nervous/anxious.     Objective:  BP 100/60 (BP Location:  Left Arm, Patient Position: Sitting, Cuff Size: Large)   Pulse 62   Temp 98.6 F (37 C) (Oral)   Ht 5\' 7"  (1.702 m)   SpO2 96%   BMI 20.96 kg/m   BP Readings from Last 3 Encounters:  07/16/23 100/60  06/16/23 134/68  05/21/23 (!) 124/58    Wt Readings from Last 3 Encounters:  06/16/23 133 lb 13.1 oz (60.7 kg)  05/21/23 137 lb (62.1 kg)  04/15/23 132 lb (59.9 kg)    Physical Exam Constitutional:      General: He is not in acute distress.    Appearance: He is well-developed.     Comments: NAD  Eyes:     Conjunctiva/sclera:  Conjunctivae normal.     Pupils: Pupils are equal, round, and reactive to light.  Neck:     Thyroid: No thyromegaly.     Vascular: No JVD.  Cardiovascular:     Rate and Rhythm: Normal rate and regular rhythm.     Heart sounds: Normal heart sounds. No murmur heard.    No friction rub. No gallop.  Pulmonary:     Effort: Pulmonary effort is normal. No respiratory distress.     Breath sounds: Normal breath sounds. No wheezing or rales.  Chest:     Chest wall: No tenderness.  Abdominal:     General: Bowel sounds are normal. There is no distension.     Palpations: Abdomen is soft. There is no mass.     Tenderness: There is no abdominal tenderness. There is no guarding or rebound.  Musculoskeletal:        General: No tenderness. Normal range of motion.     Cervical back: Normal range of motion.     Right lower leg: Edema present.     Left lower leg: Edema present.  Lymphadenopathy:     Cervical: No cervical adenopathy.  Skin:    General: Skin is warm and dry.     Findings: Bruising present. No rash.  Neurological:     Mental Status: He is alert and oriented to person, place, and time.     Cranial Nerves: No cranial nerve deficit.     Motor: No abnormal muscle tone.     Coordination: Coordination normal.     Gait: Gait abnormal.     Deep Tendon Reflexes: Reflexes are normal and symmetric.  Psychiatric:        Behavior: Behavior normal.        Thought Content: Thought content normal.        Judgment: Judgment normal.    Ceb cyst 1 cm R posterior shoulder 1 + edema B He is in a wheelchair  Lab Results  Component Value Date   WBC 42.4 (H) 06/14/2023   HGB 13.3 06/14/2023   HCT 41.5 06/14/2023   PLT 132 (L) 06/14/2023   GLUCOSE 122 (H) 06/14/2023   CHOL 119 08/14/2021   TRIG 57.0 08/14/2021   HDL 53.30 08/14/2021   LDLCALC 55 08/14/2021   ALT 17 06/14/2023   AST 25 06/14/2023   NA 137 06/14/2023   K 4.6 06/14/2023   CL 102 06/14/2023   CREATININE 0.69 06/14/2023    BUN 18 06/14/2023   CO2 27 06/14/2023   TSH 1.33 04/30/2022   PSA 8.51 (H) 08/14/2021    CT Cervical Spine Wo Contrast  Result Date: 06/15/2023 CLINICAL DATA:  Status post multiple falls. EXAM: CT CERVICAL SPINE WITHOUT CONTRAST TECHNIQUE: Multidetector CT imaging of the cervical spine was performed without  intravenous contrast. Multiplanar CT image reconstructions were also generated. RADIATION DOSE REDUCTION: This exam was performed according to the departmental dose-optimization program which includes automated exposure control, adjustment of the mA and/or kV according to patient size and/or use of iterative reconstruction technique. COMPARISON:  None Available. FINDINGS: Alignment: There is approximally 4 mm, chronic appearing retrolisthesis of the C5 vertebral body on C6. Skull base and vertebrae: No acute fracture. No primary bone lesion or focal pathologic process. Soft tissues and spinal canal: No prevertebral fluid or swelling. No visible canal hematoma. Disc levels: Marked severity endplate sclerosis is seen at the levels of C3-C4 and C5-C6, with mild to moderate severity endplate sclerosis noted throughout the remainder of the cervical spine. Marked severity anterior osteophyte formation and posterior bony spurring are also seen at C5-C6. There is marked severity narrowing of the anterior atlantoaxial articulation. Marked severity intervertebral disc space narrowing is seen at C5-C6. Moderate severity intervertebral disc space narrowing is present at the levels of C3-C4, C4-C5 and C6-C7. Bilateral marked severity multilevel facet joint hypertrophy is noted. Upper chest: Negative. Other: None. IMPRESSION: 1. Marked severity multilevel degenerative changes, most prominent at the levels of C3-C4 and C5-C6. 2. Chronic appearing retrolisthesis of the C5 vertebral body on C6. 3. No acute cervical spine fracture. Electronically Signed   By: Aram Candela M.D.   On: 06/15/2023 01:08   CT Head Wo  Contrast  Result Date: 06/15/2023 CLINICAL DATA:  Multiple falls. EXAM: CT HEAD WITHOUT CONTRAST TECHNIQUE: Contiguous axial images were obtained from the base of the skull through the vertex without intravenous contrast. RADIATION DOSE REDUCTION: This exam was performed according to the departmental dose-optimization program which includes automated exposure control, adjustment of the mA and/or kV according to patient size and/or use of iterative reconstruction technique. COMPARISON:  October 24, 2022 FINDINGS: Brain: There is mild cerebral atrophy with widening of the extra-axial spaces and ventricular dilatation. There are areas of decreased attenuation within the white matter tracts of the supratentorial brain, consistent with microvascular disease changes. Vascular: No hyperdense vessel or unexpected calcification. Skull: Normal. Negative for fracture or focal lesion. Sinuses/Orbits: No acute finding. Other: None. IMPRESSION: 1. Generalized cerebral atrophy with chronic white matter small vessel ischemic changes. 2. No acute intracranial abnormality. Electronically Signed   By: Aram Candela M.D.   On: 06/15/2023 01:00   DG Pelvis 1-2 Views  Result Date: 06/15/2023 CLINICAL DATA:  Status post multiple falls. EXAM: PELVIS - 1-2 VIEW COMPARISON:  January 10, 2022 FINDINGS: There is no evidence of an acute pelvic fracture or diastasis. Marked severity degenerative changes are seen involving both hips, in the form of joint space narrowing, subchondral cysts formation and acetabular sclerosis. Marked severe flattening of the bilateral femoral heads is also seen. IMPRESSION: Marked severity degenerative changes involving both hips, with additional findings consistent with sequelae related to avascular necrosis of the bilateral femoral heads. Electronically Signed   By: Aram Candela M.D.   On: 06/15/2023 00:59   DG Shoulder Left  Result Date: 06/15/2023 CLINICAL DATA:  Status post multiple falls.  EXAM: LEFT SHOULDER - 2+ VIEW COMPARISON:  None Available. FINDINGS: There is no evidence of an acute fracture or dislocation. Moderate severity degenerative changes seen involving the left acromioclavicular joint and left glenohumeral articulation. Mild lateral soft tissue swelling is noted. IMPRESSION: Moderate severity degenerative changes without an acute fracture or dislocation. Electronically Signed   By: Aram Candela M.D.   On: 06/15/2023 00:56   DG Elbow Complete Left  Result Date: 06/15/2023 CLINICAL DATA:  Status post multiple falls. EXAM: LEFT ELBOW - COMPLETE 3+ VIEW COMPARISON:  None Available. FINDINGS: There is no evidence of an acute fracture, dislocation, or joint effusion. Chronic and degenerative changes are seen involving the mediolateral epicondyles of the distal left humerus. Soft tissues are unremarkable. IMPRESSION: Chronic and degenerative changes without an acute fracture or dislocation. Electronically Signed   By: Aram Candela M.D.   On: 06/15/2023 00:55   DG Chest 2 View  Result Date: 06/15/2023 CLINICAL DATA:  Multiple falls. EXAM: CHEST - 2 VIEW COMPARISON:  None Available. FINDINGS: The heart size and mediastinal contours are within normal limits. There is moderate severity calcification of the aortic arch. There is no evidence of an acute infiltrate, pleural effusion or pneumothorax. Multilevel degenerative changes seen throughout the thoracic spine. IMPRESSION: No active cardiopulmonary disease. Electronically Signed   By: Aram Candela M.D.   On: 06/15/2023 00:54    Assessment & Plan:   Problem List Items Addressed This Visit     Edema    Reduce Metoprolol dose in 1/2, furosemide prn Stand up slow      CLL (chronic lymphocytic leukemia) (HCC) - Primary    Monitor CBC/WBC      Relevant Medications   doxycycline (VIBRAMYCIN) 100 MG capsule   Difficulty walking with leg weakness     Needs a scooter, Home care/ [facility] starting 07/09/23       Nocturia    Chronic Use a urinal at night Will try Gemtesa      Urinary frequency    Chronic Use a urinal at night Will try Gemtesa      Boil    On the back - infected seaceous cyst Use abx         Meds ordered this encounter  Medications   DISCONTD: Vibegron (GEMTESA) 75 MG TABS    Sig: Take 1 tablet (75 mg total) by mouth daily.    Dispense:  30 tablet    Refill:  5   Vibegron (GEMTESA) 75 MG TABS    Sig: Take 1 tablet (75 mg total) by mouth daily.    Dispense:  30 tablet    Refill:  5      Follow-up: Return in about 3 months (around 10/16/2023) for a follow-up visit.  Sonda Primes, MD

## 2023-07-16 NOTE — Progress Notes (Signed)
  Care Coordination   Note   07/16/2023 Name: Jerry Gomez MRN: [id number] DOB: 1931/03/04  Jerry Gomez is a 89 y.o. year old male who sees [name], Georgina Quint, MD for primary care. I reached out to Jerry Gomez by phone today to offer care coordination services.  [name] was given information about Care Coordination services today including:   The Care Coordination services include support from the care team which includes your Nurse Coordinator, Clinical Social Worker, or Pharmacist.  The Care Coordination team is here to help remove barriers to the health concerns and goals most important to you. Care Coordination services are voluntary, and the patient may decline or stop services at any time by request to their care team member.   Care Coordination Consent Status: Patient agreed to services and verbal consent obtained.   Follow up plan:  Telephone appointment with care coordination team member scheduled for:  08/01/2023  Encounter Outcome:  Pt. Scheduled from referral   Burman Nieves, [facility] Care Coordination Care Guide Direct Dial: [phone]

## 2023-07-16 NOTE — Assessment & Plan Note (Signed)
Chronic Use a urinal at night Will try [facility]

## 2023-07-16 NOTE — Assessment & Plan Note (Addendum)
Needs a scooter, Home care/ [facility] starting 07/09/23

## 2023-07-16 NOTE — Assessment & Plan Note (Signed)
Monitor CBC/WBC 

## 2023-07-16 NOTE — Assessment & Plan Note (Signed)
Reduce Metoprolol dose in 1/2, furosemide prn Stand up slow

## 2023-07-16 NOTE — Assessment & Plan Note (Addendum)
Chronic Use a urinal at night Will try [facility]

## 2023-07-16 NOTE — Assessment & Plan Note (Signed)
On the back - infected seaceous cyst Use abx

## 2023-07-17 DIAGNOSIS — E538 Deficiency of other specified B group vitamins: Secondary | ICD-10-CM | POA: Diagnosis not present

## 2023-07-17 DIAGNOSIS — E559 Vitamin D deficiency, unspecified: Secondary | ICD-10-CM | POA: Diagnosis not present

## 2023-07-17 DIAGNOSIS — C9510 Chronic leukemia of unspecified cell type not having achieved remission: Secondary | ICD-10-CM | POA: Diagnosis not present

## 2023-07-17 DIAGNOSIS — I10 Essential (primary) hypertension: Secondary | ICD-10-CM | POA: Diagnosis not present

## 2023-07-17 DIAGNOSIS — M1990 Unspecified osteoarthritis, unspecified site: Secondary | ICD-10-CM | POA: Diagnosis not present

## 2023-07-17 DIAGNOSIS — M71011 Abscess of bursa, right shoulder: Secondary | ICD-10-CM | POA: Diagnosis not present

## 2023-07-21 DIAGNOSIS — E559 Vitamin D deficiency, unspecified: Secondary | ICD-10-CM | POA: Diagnosis not present

## 2023-07-21 DIAGNOSIS — I10 Essential (primary) hypertension: Secondary | ICD-10-CM | POA: Diagnosis not present

## 2023-07-21 DIAGNOSIS — C9510 Chronic leukemia of unspecified cell type not having achieved remission: Secondary | ICD-10-CM | POA: Diagnosis not present

## 2023-07-21 DIAGNOSIS — M71011 Abscess of bursa, right shoulder: Secondary | ICD-10-CM | POA: Diagnosis not present

## 2023-07-21 DIAGNOSIS — M1990 Unspecified osteoarthritis, unspecified site: Secondary | ICD-10-CM | POA: Diagnosis not present

## 2023-07-21 DIAGNOSIS — E538 Deficiency of other specified B group vitamins: Secondary | ICD-10-CM | POA: Diagnosis not present

## 2023-07-22 DIAGNOSIS — M1990 Unspecified osteoarthritis, unspecified site: Secondary | ICD-10-CM | POA: Diagnosis not present

## 2023-07-22 DIAGNOSIS — I10 Essential (primary) hypertension: Secondary | ICD-10-CM | POA: Diagnosis not present

## 2023-07-22 DIAGNOSIS — E538 Deficiency of other specified B group vitamins: Secondary | ICD-10-CM | POA: Diagnosis not present

## 2023-07-22 DIAGNOSIS — E559 Vitamin D deficiency, unspecified: Secondary | ICD-10-CM | POA: Diagnosis not present

## 2023-07-22 DIAGNOSIS — M71011 Abscess of bursa, right shoulder: Secondary | ICD-10-CM | POA: Diagnosis not present

## 2023-07-22 DIAGNOSIS — C9510 Chronic leukemia of unspecified cell type not having achieved remission: Secondary | ICD-10-CM | POA: Diagnosis not present

## 2023-07-23 ENCOUNTER — Telehealth: Payer: Self-pay | Admitting: Internal Medicine

## 2023-07-23 DIAGNOSIS — M1990 Unspecified osteoarthritis, unspecified site: Secondary | ICD-10-CM | POA: Diagnosis not present

## 2023-07-23 DIAGNOSIS — E538 Deficiency of other specified B group vitamins: Secondary | ICD-10-CM | POA: Diagnosis not present

## 2023-07-23 DIAGNOSIS — M71011 Abscess of bursa, right shoulder: Secondary | ICD-10-CM | POA: Diagnosis not present

## 2023-07-23 DIAGNOSIS — C9510 Chronic leukemia of unspecified cell type not having achieved remission: Secondary | ICD-10-CM | POA: Diagnosis not present

## 2023-07-23 DIAGNOSIS — Z7982 Long term (current) use of aspirin: Secondary | ICD-10-CM | POA: Diagnosis not present

## 2023-07-23 DIAGNOSIS — I10 Essential (primary) hypertension: Secondary | ICD-10-CM | POA: Diagnosis not present

## 2023-07-23 DIAGNOSIS — Z9181 History of falling: Secondary | ICD-10-CM | POA: Diagnosis not present

## 2023-07-23 DIAGNOSIS — E559 Vitamin D deficiency, unspecified: Secondary | ICD-10-CM | POA: Diagnosis not present

## 2023-07-23 NOTE — Telephone Encounter (Signed)
Noted. Agree. Thank you.

## 2023-07-23 NOTE — Telephone Encounter (Signed)
Haynes Dage from [place] states she saw the pt during a home health visit today and was changing the dressing on his R shoulder. Pt's family showed her the area above the wound where the skin was red and looked raised. When Beth presses on the raised skin a thick white discharge began to leak from the open wound. Discharge did not have an odor to it.   Pt made an appointment with a dermatologist.   Please call Beth with any questions  [phone]

## 2023-07-25 DIAGNOSIS — E559 Vitamin D deficiency, unspecified: Secondary | ICD-10-CM | POA: Diagnosis not present

## 2023-07-25 DIAGNOSIS — M71011 Abscess of bursa, right shoulder: Secondary | ICD-10-CM | POA: Diagnosis not present

## 2023-07-25 DIAGNOSIS — I10 Essential (primary) hypertension: Secondary | ICD-10-CM | POA: Diagnosis not present

## 2023-07-25 DIAGNOSIS — M1990 Unspecified osteoarthritis, unspecified site: Secondary | ICD-10-CM | POA: Diagnosis not present

## 2023-07-25 DIAGNOSIS — C9510 Chronic leukemia of unspecified cell type not having achieved remission: Secondary | ICD-10-CM | POA: Diagnosis not present

## 2023-07-25 DIAGNOSIS — E538 Deficiency of other specified B group vitamins: Secondary | ICD-10-CM | POA: Diagnosis not present

## 2023-07-27 ENCOUNTER — Encounter: Payer: Self-pay | Admitting: Internal Medicine

## 2023-07-28 ENCOUNTER — Inpatient Hospital Stay: Payer: Medicare Other | Attending: Internal Medicine

## 2023-07-28 ENCOUNTER — Inpatient Hospital Stay (HOSPITAL_BASED_OUTPATIENT_CLINIC_OR_DEPARTMENT_OTHER): Payer: Medicare Other | Admitting: Internal Medicine

## 2023-07-28 ENCOUNTER — Other Ambulatory Visit: Payer: Self-pay

## 2023-07-28 VITALS — BP 110/50 | HR 75 | Temp 98.1°F | Resp 16 | Ht 67.0 in | Wt 133.1 lb

## 2023-07-28 DIAGNOSIS — Z7982 Long term (current) use of aspirin: Secondary | ICD-10-CM | POA: Diagnosis not present

## 2023-07-28 DIAGNOSIS — C9110 Chronic lymphocytic leukemia of B-cell type not having achieved remission: Secondary | ICD-10-CM

## 2023-07-28 DIAGNOSIS — Z79899 Other long term (current) drug therapy: Secondary | ICD-10-CM | POA: Diagnosis not present

## 2023-07-28 LAB — CBC WITH DIFFERENTIAL (CANCER CENTER ONLY)
Abs Immature Granulocytes: 0.04 10*3/uL (ref 0.00–0.07)
Basophils Absolute: 0 10*3/uL (ref 0.0–0.1)
Basophils Relative: 0 %
Eosinophils Absolute: 0.1 10*3/uL (ref 0.0–0.5)
Eosinophils Relative: 0 %
HCT: 38.7 % — ABNORMAL LOW (ref 39.0–52.0)
Hemoglobin: 12.4 g/dL — ABNORMAL LOW (ref 13.0–17.0)
Immature Granulocytes: 0 %
Lymphocytes Relative: 93 %
Lymphs Abs: 37.8 10*3/uL — ABNORMAL HIGH (ref 0.7–4.0)
MCH: 31.7 pg (ref 26.0–34.0)
MCHC: 32 g/dL (ref 30.0–36.0)
MCV: 99 fL (ref 80.0–100.0)
Monocytes Absolute: 0.3 10*3/uL (ref 0.1–1.0)
Monocytes Relative: 1 %
Neutro Abs: 2.4 10*3/uL (ref 1.7–7.7)
Neutrophils Relative %: 6 %
Platelet Count: 146 10*3/uL — ABNORMAL LOW (ref 150–400)
RBC: 3.91 MIL/uL — ABNORMAL LOW (ref 4.22–5.81)
RDW: 13.2 % (ref 11.5–15.5)
Smear Review: NORMAL
WBC Count: 40.6 10*3/uL — ABNORMAL HIGH (ref 4.0–10.5)
nRBC: 0 % (ref 0.0–0.2)

## 2023-07-28 LAB — CMP (CANCER CENTER ONLY)
ALT: 10 U/L (ref 0–44)
AST: 16 U/L (ref 15–41)
Albumin: 4.1 g/dL (ref 3.5–5.0)
Alkaline Phosphatase: 110 U/L (ref 38–126)
Anion gap: 5 (ref 5–15)
BUN: 19 mg/dL (ref 8–23)
CO2: 32 mmol/L (ref 22–32)
Calcium: 8.8 mg/dL — ABNORMAL LOW (ref 8.9–10.3)
Chloride: 103 mmol/L (ref 98–111)
Creatinine: 0.7 mg/dL (ref 0.61–1.24)
GFR, Estimated: >60 mL/min (ref >60)
Glucose, Bld: 110 mg/dL — ABNORMAL HIGH (ref 70–99)
Potassium: 4.1 mmol/L (ref 3.5–5.1)
Sodium: 140 mmol/L (ref 135–145)
Total Bilirubin: 0.9 mg/dL (ref 0.3–1.2)
Total Protein: 6.2 g/dL — ABNORMAL LOW (ref 6.5–8.1)

## 2023-07-28 LAB — LACTATE DEHYDROGENASE: LDH: 144 U/L (ref 98–192)

## 2023-07-28 NOTE — Progress Notes (Signed)
[facility] Telephone:([phone]) [phone]   Fax:([phone]) [phone]  OFFICE PROGRESS NOTE  [name], Georgina Quint, MD [address] [place] [address]  DIAGNOSIS: Stage 0 Chronic lymphocytic leukemia   PRIOR THERAPY: None   CURRENT THERAPY: Observation  INTERVAL HISTORY: Jerry Gomez 89 y.o. male turns to the clinic today for follow-up visit accompanied by his wife and his caregiver Duwayne Heck.  The patient is feeling fine today with no concerning complaints except for the weakness in the lower extremities.  He had a fall few months ago and he was in rehabilitation facility for around 2 months.  He denied having any current chest pain, shortness of breath, cough or hemoptysis.  He has no weight loss or night sweats.  He has no nausea, vomiting, diarrhea or constipation.  He has no headache or visual changes.  He is here today for evaluation and repeat blood work.  MEDICAL HISTORY: Past Medical History:  Diagnosis Date   Cancer (HCC)    CLL   Cataract    COLONIC POLYPS 03/02/2009   EYE SURGERY, HX OF 09/05/2007   HEMORRHOIDS, HX OF    HYPERLIPIDEMIA 09/05/2007   HYPERTENSION 07/25/2007   OSTEOARTHRITIS 09/05/2007   SLEEP APNEA, OBSTRUCTIVE 09/05/2007    ALLERGIES:  has No Known Allergies.  MEDICATIONS:  Current Outpatient Medications  Medication Sig Dispense Refill   Artificial Tear Ointment (ARTIFICIAL TEARS) ointment Place 1 drop into both eyes daily as needed (dry eyes).     aspirin 81 MG EC tablet Take 81 mg by mouth daily. Swallow whole.     B Complex-Folic Acid (B COMPLEX PLUS) TABS Take 1 tablet by mouth every other day. 100 tablet 3   Cholecalciferol (VITAMIN D) 2000 units tablet Take 2,000 Units by mouth daily.     COVID-19 mRNA vaccine 2023-2024 (COMIRNATY) syringe Inject into the muscle. 0.3 mL 0   doxycycline (VIBRAMYCIN) 100 MG capsule Take by mouth.     Ensure (ENSURE) Take 237 mLs by mouth daily.     furosemide (LASIX) 20 MG tablet TAKE 1 TABLET BY  MOUTH EVERY DAY (Patient taking differently: Take 20 mg by mouth every other day.) 90 tablet 1   Multiple Vitamins-Minerals (MULTIVITAMIN ADULT) CHEW Take one qod 100 tablet 3   mupirocin ointment (BACTROBAN) 2 % On leg wound w/dressing change qd or bid (Patient not taking: Reported on 06/15/2023) 30 g 0   Vibegron (GEMTESA) 75 MG TABS Take 1 tablet (75 mg total) by mouth daily. 30 tablet 5   vitamin B-12 (CYANOCOBALAMIN) 500 MCG tablet Take 500 mcg by mouth daily.     No current facility-administered medications for this visit.    SURGICAL HISTORY:  Past Surgical History:  Procedure Laterality Date   EYE SURGERY  10/22/13   left eye, cataract    REVIEW OF SYSTEMS:  A comprehensive review of systems was negative except for: Constitutional: positive for fatigue Musculoskeletal: positive for muscle weakness   PHYSICAL EXAMINATION: General appearance: alert, cooperative, appears stated age, fatigued, and no distress Head: Normocephalic, without obvious abnormality, atraumatic Neck: no adenopathy, no JVD, supple, symmetrical, trachea midline, and thyroid not enlarged, symmetric, no tenderness/mass/nodules Lymph nodes: Cervical, supraclavicular, and axillary nodes normal. Resp: clear to auscultation bilaterally Back: symmetric, no curvature. ROM normal. No CVA tenderness. Cardio: regular rate and rhythm, S1, S2 normal, no murmur, click, rub or gallop GI: soft, non-tender; bowel sounds normal; no masses,  no organomegaly Extremities: extremities normal, atraumatic, no cyanosis or  edema  ECOG PERFORMANCE STATUS: 1 - Symptomatic but completely ambulatory  Blood pressure (!) 110/50, pulse 75, temperature 98.1 F (36.7 C), temperature source Oral, resp. rate 16, height 5\' 7"  (1.702 m), weight 133 lb 1.6 oz (60.4 kg), SpO2 100%.  LABORATORY DATA: Lab Results  Component Value Date   WBC 40.6 (H) 07/28/2023   HGB 12.4 (L) 07/28/2023   HCT 38.7 (L) 07/28/2023   MCV 99.0 07/28/2023   PLT 146  (L) 07/28/2023      Chemistry      Component Value Date/Time   NA 137 06/14/2023 2346   NA 141 06/17/2017 1259   K 4.6 06/14/2023 2346   K 4.5 06/17/2017 1259   CL 102 06/14/2023 2346   CL 104 12/14/2012 1031   CO2 27 06/14/2023 2346   CO2 30 (H) 06/17/2017 1259   BUN 18 06/14/2023 2346   BUN 16.4 06/17/2017 1259   CREATININE 0.69 06/14/2023 2346   CREATININE 0.64 01/27/2023 1454   CREATININE 1.1 06/17/2017 1259      Component Value Date/Time   CALCIUM 9.0 06/14/2023 2346   CALCIUM 9.8 06/17/2017 1259   ALKPHOS 95 06/14/2023 2346   ALKPHOS 75 06/17/2017 1259   AST 25 06/14/2023 2346   AST 18 01/27/2023 1454   AST 29 06/17/2017 1259   ALT 17 06/14/2023 2346   ALT 11 01/27/2023 1454   ALT 27 06/17/2017 1259   BILITOT 1.8 (H) 06/14/2023 2346   BILITOT 0.9 01/27/2023 1454   BILITOT 2.19 (H) 06/17/2017 1259       RADIOGRAPHIC STUDIES: No results found.  ASSESSMENT AND PLAN:  This is a very pleasant 89 years old white male with a stage 0 chronic lymphocytic leukemia.  The patient has been in observation for several years now with no concerning issues. He has no B symptoms today. Repeat CBC today showed persistent leukocytosis with total white blood count of 40.6 with mild anemia and hemoglobin of 12.4 and hematocrit 38.7.  His platelet count was 146,000. I recommended for the patient to continue on observation with repeat blood work in 6 months. He was advised to call immediately if he has any other concerning symptoms in the interval. All questions were answered. The patient knows to call the clinic with any problems, questions or concerns. We can certainly see the patient much sooner if necessary.  Disclaimer: This note was dictated with voice recognition software. Similar sounding words can inadvertently be transcribed and may not be corrected upon review.

## 2023-07-29 ENCOUNTER — Other Ambulatory Visit: Payer: Self-pay | Admitting: Internal Medicine

## 2023-07-29 DIAGNOSIS — C9510 Chronic leukemia of unspecified cell type not having achieved remission: Secondary | ICD-10-CM | POA: Diagnosis not present

## 2023-07-29 DIAGNOSIS — R350 Frequency of micturition: Secondary | ICD-10-CM

## 2023-07-29 DIAGNOSIS — R972 Elevated prostate specific antigen [PSA]: Secondary | ICD-10-CM

## 2023-07-29 DIAGNOSIS — E538 Deficiency of other specified B group vitamins: Secondary | ICD-10-CM | POA: Diagnosis not present

## 2023-07-29 DIAGNOSIS — M71011 Abscess of bursa, right shoulder: Secondary | ICD-10-CM | POA: Diagnosis not present

## 2023-07-29 DIAGNOSIS — I10 Essential (primary) hypertension: Secondary | ICD-10-CM | POA: Diagnosis not present

## 2023-07-29 DIAGNOSIS — E559 Vitamin D deficiency, unspecified: Secondary | ICD-10-CM | POA: Diagnosis not present

## 2023-07-29 DIAGNOSIS — M1990 Unspecified osteoarthritis, unspecified site: Secondary | ICD-10-CM | POA: Diagnosis not present

## 2023-07-30 DIAGNOSIS — M1990 Unspecified osteoarthritis, unspecified site: Secondary | ICD-10-CM | POA: Diagnosis not present

## 2023-07-30 DIAGNOSIS — E559 Vitamin D deficiency, unspecified: Secondary | ICD-10-CM | POA: Diagnosis not present

## 2023-07-30 DIAGNOSIS — C9510 Chronic leukemia of unspecified cell type not having achieved remission: Secondary | ICD-10-CM | POA: Diagnosis not present

## 2023-07-30 DIAGNOSIS — M71011 Abscess of bursa, right shoulder: Secondary | ICD-10-CM | POA: Diagnosis not present

## 2023-07-30 DIAGNOSIS — E538 Deficiency of other specified B group vitamins: Secondary | ICD-10-CM | POA: Diagnosis not present

## 2023-07-30 DIAGNOSIS — I10 Essential (primary) hypertension: Secondary | ICD-10-CM | POA: Diagnosis not present

## 2023-08-01 ENCOUNTER — Ambulatory Visit: Payer: Self-pay

## 2023-08-01 DIAGNOSIS — M1990 Unspecified osteoarthritis, unspecified site: Secondary | ICD-10-CM | POA: Diagnosis not present

## 2023-08-01 DIAGNOSIS — C9510 Chronic leukemia of unspecified cell type not having achieved remission: Secondary | ICD-10-CM | POA: Diagnosis not present

## 2023-08-01 DIAGNOSIS — M71011 Abscess of bursa, right shoulder: Secondary | ICD-10-CM | POA: Diagnosis not present

## 2023-08-01 DIAGNOSIS — I10 Essential (primary) hypertension: Secondary | ICD-10-CM | POA: Diagnosis not present

## 2023-08-01 DIAGNOSIS — E559 Vitamin D deficiency, unspecified: Secondary | ICD-10-CM | POA: Diagnosis not present

## 2023-08-01 DIAGNOSIS — E538 Deficiency of other specified B group vitamins: Secondary | ICD-10-CM | POA: Diagnosis not present

## 2023-08-01 NOTE — Patient Instructions (Signed)
Visit Information  Thank you for taking time to visit with me today. Please don't hesitate to contact me if I can be of assistance to you.   Following are the goals we discussed today:  Continue to take medications as prescribed. Continue to attend provider visits as scheduled Continue to eat healthy, lean meats, vegetables, fruits, avoid saturated and transfats Continue to work with Home Health Therapist   Our next appointment is by telephone on 08/19/23 at 10:30 am  Please call the care guide team at [phone] if you need to cancel or reschedule your appointment.   If you are experiencing a Mental Health or Behavioral Health Crisis or need someone to talk to, please call the Suicide and Crisis Lifeline: 988 call the [place] National Suicide Prevention Lifeline: 778-802-2110 or TTY: (641)272-4876 TTY 670-103-8309) to talk to a trained counselor call 1-800-273-TALK (toll free, 24 hour hotline)  Kathyrn Sheriff, RN, MSN, BSN, CCM Care Coordinator [phone]

## 2023-08-01 NOTE — Patient Outreach (Signed)
  Care Coordination   Initial Visit Note   08/01/2023 Name: Jerry Gomez MRN: [id number] DOB: November 28, 1931  Jerry Gomez is a 89 y.o. year old male who sees [name], Georgina Quint, MD for primary care. I spoke with  Jerry Gomez and spouse by phone today.  What matters to the patients health and wellness today?  Jerry Gomez reports he is improving since discharge from [facility] rehab. He reports active with home health for RN/PT/OT. He denies any falls since discharge. Has in home care provider to assist as needed. He denies any concerns at this time. Jerry Gomez is interested in information to review re: higher level of care (assisted living) if needed in the future.  Goals Addressed             This Visit's Progress    care coordination post rehab       Interventions Today    Flowsheet Row Most Recent Value  Chronic Disease   Chronic disease during today's visit Other  [post rehab falls]  General Interventions   General Interventions Discussed/Reviewed General Interventions Discussed, Doctor Visits  Doctor Visits Discussed/Reviewed Doctor Visits Discussed, PCP  PCP/Specialist Visits Compliance with follow-up visit  [reviewed upcoming appointments, encouraged to attend as scheduled]  Education Interventions   Education Provided Provided Education  Provided Verbal Education On Other, Medication, When to see the doctor  [advised to contact provider as scheduled, take medications as prescribed, contact provider if health questions or concerns]  Pharmacy Interventions   Pharmacy Dicussed/Reviewed Pharmacy Topics Discussed, Medication Adherence, Affording Medications, Medications and their functions  [medication review completed, encouraged to take as prescribed]  Safety Interventions   Safety Discussed/Reviewed Safety Discussed, Fall Risk, Home Safety  Home Safety Assistive Devices  [discussed fall preventiion-denies any falls since discharge from rehab. confirmed active with [facility]  OT/PT/nursing. encouraged to perform exercises as recommended by therapist]            SDOH assessments and interventions completed:  Yes  SDOH Interventions Today    Flowsheet Row Most Recent Value  SDOH Interventions   Food Insecurity Interventions Intervention Not Indicated  Housing Interventions Intervention Not Indicated  Transportation Interventions Intervention Not Indicated  Utilities Interventions Intervention Not Indicated     Care Coordination Interventions:  Yes, provided   Follow up plan: Follow up call scheduled for 08/19/23    Encounter Outcome:  Pt. Visit Completed   Kathyrn Sheriff, RN, MSN, BSN, CCM Care Coordinator [phone]

## 2023-08-05 DIAGNOSIS — M1990 Unspecified osteoarthritis, unspecified site: Secondary | ICD-10-CM | POA: Diagnosis not present

## 2023-08-05 DIAGNOSIS — E538 Deficiency of other specified B group vitamins: Secondary | ICD-10-CM | POA: Diagnosis not present

## 2023-08-05 DIAGNOSIS — M71011 Abscess of bursa, right shoulder: Secondary | ICD-10-CM | POA: Diagnosis not present

## 2023-08-05 DIAGNOSIS — E559 Vitamin D deficiency, unspecified: Secondary | ICD-10-CM | POA: Diagnosis not present

## 2023-08-05 DIAGNOSIS — C9510 Chronic leukemia of unspecified cell type not having achieved remission: Secondary | ICD-10-CM | POA: Diagnosis not present

## 2023-08-05 DIAGNOSIS — I10 Essential (primary) hypertension: Secondary | ICD-10-CM | POA: Diagnosis not present

## 2023-08-06 DIAGNOSIS — E538 Deficiency of other specified B group vitamins: Secondary | ICD-10-CM | POA: Diagnosis not present

## 2023-08-06 DIAGNOSIS — E559 Vitamin D deficiency, unspecified: Secondary | ICD-10-CM | POA: Diagnosis not present

## 2023-08-06 DIAGNOSIS — M1990 Unspecified osteoarthritis, unspecified site: Secondary | ICD-10-CM | POA: Diagnosis not present

## 2023-08-06 DIAGNOSIS — C9510 Chronic leukemia of unspecified cell type not having achieved remission: Secondary | ICD-10-CM | POA: Diagnosis not present

## 2023-08-06 DIAGNOSIS — I10 Essential (primary) hypertension: Secondary | ICD-10-CM | POA: Diagnosis not present

## 2023-08-06 DIAGNOSIS — M71011 Abscess of bursa, right shoulder: Secondary | ICD-10-CM | POA: Diagnosis not present

## 2023-08-07 DIAGNOSIS — C9510 Chronic leukemia of unspecified cell type not having achieved remission: Secondary | ICD-10-CM | POA: Diagnosis not present

## 2023-08-07 DIAGNOSIS — M71011 Abscess of bursa, right shoulder: Secondary | ICD-10-CM | POA: Diagnosis not present

## 2023-08-07 DIAGNOSIS — I10 Essential (primary) hypertension: Secondary | ICD-10-CM | POA: Diagnosis not present

## 2023-08-07 DIAGNOSIS — M1990 Unspecified osteoarthritis, unspecified site: Secondary | ICD-10-CM | POA: Diagnosis not present

## 2023-08-07 DIAGNOSIS — E538 Deficiency of other specified B group vitamins: Secondary | ICD-10-CM | POA: Diagnosis not present

## 2023-08-07 DIAGNOSIS — E559 Vitamin D deficiency, unspecified: Secondary | ICD-10-CM | POA: Diagnosis not present

## 2023-08-12 DIAGNOSIS — E559 Vitamin D deficiency, unspecified: Secondary | ICD-10-CM | POA: Diagnosis not present

## 2023-08-12 DIAGNOSIS — M1990 Unspecified osteoarthritis, unspecified site: Secondary | ICD-10-CM | POA: Diagnosis not present

## 2023-08-12 DIAGNOSIS — M71011 Abscess of bursa, right shoulder: Secondary | ICD-10-CM | POA: Diagnosis not present

## 2023-08-12 DIAGNOSIS — I10 Essential (primary) hypertension: Secondary | ICD-10-CM | POA: Diagnosis not present

## 2023-08-12 DIAGNOSIS — Z9181 History of falling: Secondary | ICD-10-CM | POA: Diagnosis not present

## 2023-08-12 DIAGNOSIS — C9510 Chronic leukemia of unspecified cell type not having achieved remission: Secondary | ICD-10-CM | POA: Diagnosis not present

## 2023-08-12 DIAGNOSIS — E538 Deficiency of other specified B group vitamins: Secondary | ICD-10-CM | POA: Diagnosis not present

## 2023-08-12 DIAGNOSIS — Z7982 Long term (current) use of aspirin: Secondary | ICD-10-CM | POA: Diagnosis not present

## 2023-08-13 ENCOUNTER — Ambulatory Visit: Payer: Self-pay | Admitting: Licensed Clinical Social Worker

## 2023-08-13 NOTE — Patient Instructions (Signed)
   It was a pleasure speaking with you today. I am sorry you were unable to keep your phone appointment today.   The Care Guide will contact you to reschedule the phone appointment    Casimer Lanius, [place] Work Care Coordination  [phone]

## 2023-08-13 NOTE — Patient Outreach (Signed)
  Care Coordination  Initial Visit Note   08/13/2023 Name: Jerry Gomez MRN: [id number] DOB: [birth year]  Jerry Gomez is a 89 y.o. year old male who sees [name], Georgina Quint, MD for primary care. I spoke with  Maggie Krippner Brendlinger's wife by phone today.  What matters to the patients health and wellness today?    Patient was in the shower, working today with PT and OT. Unable to take call.   Stated she tried to cancel the phone appointment for today.  Per wife she would like to reschedule at a later dated but unsure at this time.  LCSW Provided contact information.  LCSW will also collaborate with RN to check in with patient and wife during her next phone appointment 08/19/23  SDOH assessments and interventions completed:  No   Care Coordination Interventions:  No, not indicated   Follow up plan:  no follow up scheduled    Encounter Outcome:  Patient Visit Completed   Sammuel Hines, LCSW [place]  [facility], [facility] Licensed Clinical Social Work Care Coordinator  Direct Dial: [phone]

## 2023-08-14 DIAGNOSIS — M71011 Abscess of bursa, right shoulder: Secondary | ICD-10-CM | POA: Diagnosis not present

## 2023-08-14 DIAGNOSIS — I10 Essential (primary) hypertension: Secondary | ICD-10-CM | POA: Diagnosis not present

## 2023-08-14 DIAGNOSIS — C9510 Chronic leukemia of unspecified cell type not having achieved remission: Secondary | ICD-10-CM | POA: Diagnosis not present

## 2023-08-14 DIAGNOSIS — E538 Deficiency of other specified B group vitamins: Secondary | ICD-10-CM | POA: Diagnosis not present

## 2023-08-14 DIAGNOSIS — E559 Vitamin D deficiency, unspecified: Secondary | ICD-10-CM | POA: Diagnosis not present

## 2023-08-14 DIAGNOSIS — M1990 Unspecified osteoarthritis, unspecified site: Secondary | ICD-10-CM | POA: Diagnosis not present

## 2023-08-18 DIAGNOSIS — L821 Other seborrheic keratosis: Secondary | ICD-10-CM | POA: Diagnosis not present

## 2023-08-18 DIAGNOSIS — D692 Other nonthrombocytopenic purpura: Secondary | ICD-10-CM | POA: Diagnosis not present

## 2023-08-18 DIAGNOSIS — Z85828 Personal history of other malignant neoplasm of skin: Secondary | ICD-10-CM | POA: Diagnosis not present

## 2023-08-18 DIAGNOSIS — L820 Inflamed seborrheic keratosis: Secondary | ICD-10-CM | POA: Diagnosis not present

## 2023-08-18 DIAGNOSIS — L570 Actinic keratosis: Secondary | ICD-10-CM | POA: Diagnosis not present

## 2023-08-18 DIAGNOSIS — L720 Epidermal cyst: Secondary | ICD-10-CM | POA: Diagnosis not present

## 2023-08-19 ENCOUNTER — Ambulatory Visit: Payer: Self-pay

## 2023-08-19 NOTE — Patient Outreach (Signed)
  Care Coordination   Follow Up Visit Note   08/19/2023 Name: Jerry Gomez MRN: [id number] DOB: 10/13/1931  Jerry Gomez is a 89 y.o. year old male who sees [name], Georgina Quint, MD for primary care. I spoke with  Jerry Gomez and spouse Jerry Gomez by phone today.  What matters to the patients health and wellness today?  Jerry Gomez reports he is improving. Jerry Gomez on the line also to assist patient with keeping track of health. Discussed upcoming appointments. Per Jerry Gomez, patient was supposed to have an MRI(ordered by neurology), but reports patient was int the hsoptial at the time. Followed by a follow up appointment in December. Jerry Gomez reports will contact neurology to discuss with Neurology rescheduling MRI.   Goals Addressed             This Visit's Progress    care coordination post rehab       Interventions Today    Flowsheet Row Most Recent Value  Chronic Disease   Chronic disease during today's visit Other  [post rehab, h/o falls]  General Interventions   General Interventions Discussed/Reviewed General Interventions Reviewed, Vaccines, Doctor Visits  Vaccines Flu, COVID-19  [discussed vaccinations. patient/spouse to contact provider to discuss recommendations.]  Doctor Visits Discussed/Reviewed Doctor Visits Reviewed  PCP/Specialist Visits Compliance with follow-up visit  [reviewed upcoming appointments. upcoming urology appointmetnt 08/22/23]  Exercise Interventions   Exercise Discussed/Reviewed Exercise Reviewed  [advised to perform recommended exercises per therapist to assist in improving muscle tone/function]  Education Interventions   Education Provided Provided Education  Provided Verbal Education On Other, Exercise, Medication, When to see the doctor  [advised to continue to take medications as prescribed, attend provider visits as scheduled, contact providers for health questions/concerns. advised to contact neurology office to discuss missed  MRI.]  Nutrition Interventions   Nutrition Discussed/Reviewed Nutrition Reviewed, Supplemental nutrition  [assessed eating patterns. patient confirmed he is eating well. Also reports he is drinking ensure supplements]  Pharmacy Interventions   Pharmacy Dicussed/Reviewed Pharmacy Topics Reviewed  Safety Interventions   Safety Discussed/Reviewed Fall Risk, Safety Reviewed  Home Safety Assistive Devices  [encouraged use of walker at all times]            SDOH assessments and interventions completed:  No  Care Coordination Interventions:  Yes, provided   Follow up plan: Follow up call scheduled for 09/30/23    Encounter Outcome:  Patient Visit Completed   Kathyrn Sheriff, RN, MSN, BSN, CCM Care Management Coordinator [phone]

## 2023-08-19 NOTE — Patient Instructions (Signed)
Visit Information  Thank you for taking time to visit with me today. Please don't hesitate to contact me if I can be of assistance to you.   Following are the goals we discussed today:  Continue to take medications as prescribed. Continue to attend provider visits as scheduled. Call Neurologist to discussed Missed MRI. Continue to eat healthy, lean meats, vegetables, fruits, avoid saturated and transfats Contact provider with health questions or concerns  Our next appointment is by telephone on 09/30/23 at 10:30 am  Please call the care guide team at [phone] if you need to cancel or reschedule your appointment.   If you are experiencing a Mental Health or Behavioral Health Crisis or need someone to talk to, please call the Suicide and Crisis Lifeline: 988 call the [place] National Suicide Prevention Lifeline: 579-682-0509 or TTY: (719) 887-6415 TTY 361 296 1795) to talk to a trained counselor call 1-800-273-TALK (toll free, 24 hour hotline)  Kathyrn Sheriff, RN, MSN, BSN, CCM Care Management Coordinator [phone]

## 2023-08-20 ENCOUNTER — Other Ambulatory Visit: Payer: Self-pay | Admitting: Internal Medicine

## 2023-08-20 DIAGNOSIS — M1990 Unspecified osteoarthritis, unspecified site: Secondary | ICD-10-CM | POA: Diagnosis not present

## 2023-08-20 DIAGNOSIS — E559 Vitamin D deficiency, unspecified: Secondary | ICD-10-CM | POA: Diagnosis not present

## 2023-08-20 DIAGNOSIS — M71011 Abscess of bursa, right shoulder: Secondary | ICD-10-CM | POA: Diagnosis not present

## 2023-08-20 DIAGNOSIS — C9510 Chronic leukemia of unspecified cell type not having achieved remission: Secondary | ICD-10-CM | POA: Diagnosis not present

## 2023-08-20 DIAGNOSIS — E538 Deficiency of other specified B group vitamins: Secondary | ICD-10-CM | POA: Diagnosis not present

## 2023-08-20 DIAGNOSIS — I10 Essential (primary) hypertension: Secondary | ICD-10-CM | POA: Diagnosis not present

## 2023-08-22 DIAGNOSIS — N401 Enlarged prostate with lower urinary tract symptoms: Secondary | ICD-10-CM | POA: Diagnosis not present

## 2023-08-22 DIAGNOSIS — R3915 Urgency of urination: Secondary | ICD-10-CM | POA: Diagnosis not present

## 2023-08-22 DIAGNOSIS — R972 Elevated prostate specific antigen [PSA]: Secondary | ICD-10-CM | POA: Diagnosis not present

## 2023-08-22 DIAGNOSIS — R351 Nocturia: Secondary | ICD-10-CM | POA: Diagnosis not present

## 2023-08-26 DIAGNOSIS — I10 Essential (primary) hypertension: Secondary | ICD-10-CM | POA: Diagnosis not present

## 2023-08-26 DIAGNOSIS — E538 Deficiency of other specified B group vitamins: Secondary | ICD-10-CM | POA: Diagnosis not present

## 2023-08-26 DIAGNOSIS — M71011 Abscess of bursa, right shoulder: Secondary | ICD-10-CM | POA: Diagnosis not present

## 2023-08-26 DIAGNOSIS — C9510 Chronic leukemia of unspecified cell type not having achieved remission: Secondary | ICD-10-CM | POA: Diagnosis not present

## 2023-08-26 DIAGNOSIS — E559 Vitamin D deficiency, unspecified: Secondary | ICD-10-CM | POA: Diagnosis not present

## 2023-08-26 DIAGNOSIS — M1990 Unspecified osteoarthritis, unspecified site: Secondary | ICD-10-CM | POA: Diagnosis not present

## 2023-08-27 DIAGNOSIS — M71011 Abscess of bursa, right shoulder: Secondary | ICD-10-CM | POA: Diagnosis not present

## 2023-08-27 DIAGNOSIS — E538 Deficiency of other specified B group vitamins: Secondary | ICD-10-CM | POA: Diagnosis not present

## 2023-08-27 DIAGNOSIS — E559 Vitamin D deficiency, unspecified: Secondary | ICD-10-CM | POA: Diagnosis not present

## 2023-08-27 DIAGNOSIS — M1990 Unspecified osteoarthritis, unspecified site: Secondary | ICD-10-CM | POA: Diagnosis not present

## 2023-08-27 DIAGNOSIS — C9510 Chronic leukemia of unspecified cell type not having achieved remission: Secondary | ICD-10-CM | POA: Diagnosis not present

## 2023-08-27 DIAGNOSIS — I10 Essential (primary) hypertension: Secondary | ICD-10-CM | POA: Diagnosis not present

## 2023-09-02 ENCOUNTER — Ambulatory Visit (INDEPENDENT_AMBULATORY_CARE_PROVIDER_SITE_OTHER): Payer: Medicare Other | Admitting: Emergency Medicine

## 2023-09-02 VITALS — Ht 66.0 in | Wt 131.0 lb

## 2023-09-02 DIAGNOSIS — Z0000 Encounter for general adult medical examination without abnormal findings: Secondary | ICD-10-CM

## 2023-09-02 NOTE — Patient Instructions (Addendum)
Jerry Gomez , Thank you for taking time to come for your Medicare Wellness Visit. I appreciate your ongoing commitment to your health goals. Please review the following plan we discussed and let me know if I can assist you in the future.   Referrals/Orders/Follow-Ups/Clinician Recommendations: Get the flu and covid vaccines at your earliest convenience.  This is a list of the screening recommended for you and due dates:  Health Maintenance  Topic Date Due   Zoster (Shingles) Vaccine (1 of 2) 07/12/1950   COVID-19 Vaccine (6 - 2023-24 season) 08/10/2023   Flu Shot  03/08/2024*   Medicare Annual Wellness Visit  09/01/2024   DTaP/Tdap/Td vaccine (4 - Td or Tdap) 04/06/2033   Pneumonia Vaccine  Completed   HPV Vaccine  Aged Out  *Topic was postponed. The date shown is not the original due date.    Advanced directives: (Copy Requested) Please bring a copy of your health care power of attorney and living will to the office to be added to your chart at your convenience.  Next Medicare Annual Wellness Visit scheduled for next year: Yes, 09/06/24 @ 3:45pm  Fall Prevention in the Home, Adult Falls can cause injuries and affect people of all ages. There are many simple things that you can do to make your home safe and to help prevent falls. If you need it, ask for help making these changes. What actions can I take to prevent falls? General information Use good lighting in all rooms. Make sure to: Replace any light bulbs that burn out. Turn on lights if it is dark and use night-lights. Keep items that you use often in easy-to-reach places. Lower the shelves around your home if needed. Move furniture so that there are clear paths around it. Do not keep throw rugs or other things on the floor that can make you trip. If any of your floors are uneven, fix them. Add color or contrast paint or tape to clearly mark and help you see: Grab bars or handrails. First and last steps of staircases. Where  the edge of each step is. If you use a ladder or stepladder: Make sure that it is fully opened. Do not climb a closed ladder. Make sure the sides of the ladder are locked in place. Have someone hold the ladder while you use it. Know where your pets are as you move through your home. What can I do in the bathroom?     Keep the floor dry. Clean up any water that is on the floor right away. Remove soap buildup in the bathtub or shower. Buildup makes bathtubs and showers slippery. Use non-skid mats or decals on the floor of the bathtub or shower. Attach bath mats securely with double-sided, non-slip rug tape. If you need to sit down while you are in the shower, use a non-slip stool. Install grab bars by the toilet and in the bathtub and shower. Do not use towel bars as grab bars. What can I do in the bedroom? Make sure that you have a light by your bed that is easy to reach. Do not use any sheets or blankets on your bed that hang to the floor. Have a firm bench or chair with side arms that you can use for support when you get dressed. What can I do in the kitchen? Clean up any spills right away. If you need to reach something above you, use a sturdy step stool that has a grab bar. Keep electrical cables out of  the way. Do not use floor polish or wax that makes floors slippery. What can I do with my stairs? Do not leave anything on the stairs. Make sure that you have a light switch at the top and the bottom of the stairs. Have them installed if you do not have them. Make sure that there are handrails on both sides of the stairs. Fix handrails that are broken or loose. Make sure that handrails are as long as the staircases. Install non-slip stair treads on all stairs in your home if they do not have carpet. Avoid having throw rugs at the top or bottom of stairs, or secure the rugs with carpet tape to prevent them from moving. Choose a carpet design that does not hide the edge of steps on the  stairs. Make sure that carpet is firmly attached to the stairs. Fix any carpet that is loose or worn. What can I do on the outside of my home? Use bright outdoor lighting. Repair the edges of walkways and driveways and fix any cracks. Clear paths of anything that can make you trip, such as tools or rocks. Add color or contrast paint or tape to clearly mark and help you see high doorway thresholds. Trim any bushes or trees on the main path into your home. Check that handrails are securely fastened and in good repair. Both sides of all steps should have handrails. Install guardrails along the edges of any raised decks or porches. Have leaves, snow, and ice cleared regularly. Use sand, salt, or ice melt on walkways during winter months if you live where there is ice and snow. In the garage, clean up any spills right away, including grease or oil spills. What other actions can I take? Review your medicines with your health care provider. Some medicines can make you confused or feel dizzy. This can increase your chance of falling. Wear closed-toe shoes that fit well and support your feet. Wear shoes that have rubber soles and low heels. Use a cane, walker, scooter, or crutches that help you move around if needed. Talk with your provider about other ways that you can decrease your risk of falls. This may include seeing a physical therapist to learn to do exercises to improve movement and strength. Where to find more information Centers for Disease Control and Prevention, [employer]: TonerPromos.no [employer] on Aging: BaseRingTones.pl National Institute on Aging: BaseRingTones.pl Contact a health care provider if: You are afraid of falling at home. You feel weak, drowsy, or dizzy at home. You fall at home. Get help right away if you: Lose consciousness or have trouble moving after a fall. Have a fall that causes a head injury. These symptoms may be an emergency. Get help right away. Call 911. Do not wait to  see if the symptoms will go away. Do not drive yourself to the hospital. This information is not intended to replace advice given to you by your health care provider. Make sure you discuss any questions you have with your health care provider. Document Revised: 07/29/2022 Document Reviewed: 07/29/2022 Elsevier Patient Education  2024 [employer].

## 2023-09-02 NOTE — Progress Notes (Cosign Needed)
Subjective:   Jerry Gomez is a 89 y.o. male who presents for Medicare Annual/Subsequent preventive examination.  Visit Complete: Virtual  I connected with  Jerry Gomez on 09/02/23 by a audio enabled telemedicine application and verified that I am speaking with the correct person using two identifiers.  Patient Location: Home  Provider Location: Office/Clinic  I discussed the limitations of evaluation and management by telemedicine. The patient expressed understanding and agreed to proceed.  Vital Signs: Because this visit was a virtual/telehealth visit, some criteria may be missing or patient reported. Any vitals not documented were not able to be obtained and vitals that have been documented are patient reported.     Cardiac Risk Factors include: advanced age (>63men, >90 women);male gender;hypertension;dyslipidemia     Objective:    Today's Vitals   09/02/23 1538 09/02/23 1539  Weight: 131 lb (59.4 kg)   Height: 5\' 6"  (1.676 m)   PainSc:  4    Body mass index is 21.14 kg/m.     09/02/2023    3:59 PM 06/16/2023    3:53 AM 04/07/2023   10:53 AM 08/19/2022    2:57 PM 04/03/2022    2:19 PM 01/24/2022    3:12 PM 01/10/2022   12:07 PM  Advanced Directives  Does Patient Have a Medical Advance Directive? Yes Unable to assess, patient is non-responsive or altered mental status Yes Yes Yes Yes Yes  Type of Advance Directive Healthcare Power of [place];Living will   Healthcare Power of [place];Living will Healthcare Power of [place];Living will Healthcare Power of [place];Living will Healthcare Power of [place];Living will  Does patient want to make changes to medical advance directive? No - Patient declined    No - Patient declined No - Patient declined No - Patient declined  Copy of Healthcare Power of Attorney in Chart? No - copy requested   No - copy requested No - copy requested No - copy requested No - copy requested    Current Medications (verified) Outpatient  Encounter Medications as of 09/02/2023  Medication Sig   Artificial Tear Ointment (ARTIFICIAL TEARS) ointment Place 1 drop into both eyes daily as needed (dry eyes).   aspirin 81 MG EC tablet Take 81 mg by mouth daily. Swallow whole.   Cholecalciferol (VITAMIN D) 2000 units tablet Take 2,000 Units by mouth daily.   Ensure (ENSURE) Take 237 mLs by mouth daily.   finasteride (PROSCAR) 5 MG tablet Take 5 mg by mouth daily.   furosemide (LASIX) 20 MG tablet TAKE 1 TABLET BY MOUTH EVERY DAY   Multiple Vitamins-Minerals (MULTIVITAMIN ADULT) CHEW Take one qod   Vibegron (GEMTESA) 75 MG TABS Take 1 tablet (75 mg total) by mouth daily.   vitamin B-12 (CYANOCOBALAMIN) 500 MCG tablet Take 500 mcg by mouth daily.   B Complex-Folic Acid (B COMPLEX PLUS) TABS Take 1 tablet by mouth every other day. (Patient not taking: Reported on 08/01/2023)   COVID-19 mRNA vaccine 2023-2024 (COMIRNATY) syringe Inject into the muscle. (Patient not taking: Reported on 09/02/2023)   doxycycline (VIBRAMYCIN) 100 MG capsule Take by mouth. (Patient not taking: Reported on 08/01/2023)   Multiple Vitamins-Minerals (PRESERVISION AREDS PO) Take 1 capsule by mouth daily at 6 (six) AM. (Patient not taking: Reported on 09/02/2023)   mupirocin ointment (BACTROBAN) 2 % ON LEG WOUND W/DRESSING CHANGE ONCE OR TWICE DAILY (Patient not taking: Reported on 09/02/2023)   No facility-administered encounter medications on file as of 09/02/2023.    Allergies (verified) Patient has no known  allergies.   History: Past Medical History:  Diagnosis Date   Cancer (HCC)    CLL   Cataract    COLONIC POLYPS 03/02/2009   EYE SURGERY, HX OF 09/05/2007   HEMORRHOIDS, HX OF    HYPERLIPIDEMIA 09/05/2007   HYPERTENSION 07/25/2007   OSTEOARTHRITIS 09/05/2007   SLEEP APNEA, OBSTRUCTIVE 09/05/2007   Past Surgical History:  Procedure Laterality Date   EYE SURGERY  10/22/13   left eye, cataract   Family History  Problem Relation Age of Onset   Other Mother  3       natural causes   Kidney disease Father    Cancer Father        prostate-late on-set   Cancer Brother        non-hodgkins lymphoma   Colon cancer Neg Hx    Esophageal cancer Neg Hx    Rectal cancer Neg Hx    Stomach cancer Neg Hx    Social History   Socioeconomic History   Marital status: Married    Spouse name: Mary   Number of children: 2   Years of education: 13   Highest education level: Not on file  Occupational History   Occupation: retired  Tobacco Use   Smoking status: Former    Current packs/day: 0.00    Types: Cigarettes    Quit date: 02/07/1963    Years since quitting: 60.6    Passive exposure: Past   Smokeless tobacco: Never   Tobacco comments:    Rarely smoked between ages [age]   Vaping Use   Vaping status: Never Used  Substance and Sexual Activity   Alcohol use: Yes    Alcohol/week: 14.0 standard drinks of alcohol    Types: 14 Standard drinks or equivalent per week    Comment: 1-2 margarita nightly   Drug use: No   Sexual activity: Yes    Partners: Female  Other Topics Concern   Not on file  Social History Narrative   HSG, [place]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 Married '54. 2 sons ' 63, '67. 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 CPR- yes, short-term mechanical ventilation, no prolonged heroic or futile measures.       Right hand   Leaving 2 level home   Drink coffee and ice tea   Social Determinants of Health   Financial Resource Strain: Low Risk  (09/02/2023)   Overall Financial Resource Strain (CARDIA)    Difficulty of Paying Living Expenses: Not hard at all  Food Insecurity: No Food Insecurity (09/02/2023)   Hunger Vital Sign    Worried About Running Out of Food in the Last Year: Never true    Ran Out of Food in the Last Year: Never true  Transportation Needs: No Transportation Needs (09/02/2023)   PRAPARE - Administrator, Civil Service (Medical): No    Lack of Transportation  (Non-Medical): No  Physical Activity: Insufficiently Active (09/02/2023)   Exercise Vital Sign    Days of Exercise per Week: 7 days    Minutes of Exercise per Session: 10 min  Stress: No Stress Concern Present (09/02/2023)   [employer] of Occupational Health - Occupational Stress Questionnaire    Feeling of Stress : Not at all  Social Connections: Moderately Integrated (09/02/2023)   Social Connection and Isolation Panel [NHANES]    Frequency of Communication with Friends and Family: More than three times a week    Frequency of Social Gatherings with Friends and Family:  Once a week    Attends Religious Services: Never    Active Member of Clubs or Organizations: Yes    Attends Banker Meetings: 1 to 4 times per year    Marital Status: Married    Tobacco Counseling Counseling given: Not Answered Tobacco comments: Rarely smoked between ages [age]    Clinical Intake:  Pre-visit preparation completed: Yes  Pain : 0-10 Pain Score: 4  Pain Type: Chronic pain Pain Location: Hip Pain Orientation: Right, Left Pain Descriptors / Indicators: Aching     BMI - recorded: 21.14 Nutritional Status: BMI of 19-24  Normal Nutritional Risks: None Diabetes: No  How often do you need to have someone help you when you read instructions, pamphlets, or other written materials from your doctor or pharmacy?: 1 - Never  Interpreter Needed?: No  Information entered by :: Tora Kindred, CMA   Activities of Daily Living    09/02/2023    3:43 PM  In your present state of health, do you have any difficulty performing the following activities:  Hearing? 0  Vision? 0  Difficulty concentrating or making decisions? 0  Walking or climbing stairs? 1  Comment uses walker  Dressing or bathing? 1  Comment caregiver in the home 8am-8pm  Doing errands, shopping? 1  Comment wife Insurance claims handler and eating ? Y  Comment wife and caregivers prepare meals  Using the Toilet? N  In  the past six months, have you accidently leaked urine? N  Do you have problems with loss of bowel control? N  Managing your Medications? Y  Comment wife helps  Managing your Finances? Y  Comment wife helps  Housekeeping or managing your Housekeeping? Y  Comment wife and caregivers help    Patient Care Team: [name], Georgina Quint, MD as PCP - General (Internal Medicine) Mathews Robinsons, MD (Dermatology) Si Gaul, MD (Hematology and Oncology) Holli Humbles, MD as Referring Physician (Ophthalmology)  Indicate any recent Medical Services you may have received from other than [facility] providers in the past year (date may be approximate).     Assessment:   This is a routine wellness examination for [facility].  Hearing/Vision screen Hearing Screening - Comments:: Denies hearing loss Vision Screening - Comments:: Gets routine eye exams   Goals Addressed               This Visit's Progress     Patient Stated (pt-stated)        Maintain current health      Depression Screen    09/02/2023    3:57 PM 04/03/2023    2:07 PM 02/05/2023    3:42 PM 10/23/2022    3:09 PM 08/19/2022    2:57 PM 08/14/2021    9:50 AM 02/01/2021   10:18 AM  PHQ 2/9 Scores  PHQ - 2 Score 0 0 0 0 0 0 0  PHQ- 9 Score 0      0    Fall Risk    09/02/2023    4:00 PM 04/03/2023    2:07 PM 02/05/2023    3:42 PM 10/23/2022    3:09 PM 08/19/2022    2:54 PM  Fall Risk   Falls in the past year? 1 0 0 1 0  Number falls in past yr: 1 0 0 0 0  Injury with Fall? 1 0 0 1 0  Risk for fall due to : History of fall(s);Impaired balance/gait;Impaired mobility;Orthopedic patient No Fall Risks No Fall Risks History of fall(s) No  Fall Risks  Follow up Falls evaluation completed;Education provided;Falls prevention discussed Falls evaluation completed Falls evaluation completed Falls evaluation completed Falls prevention discussed    MEDICARE RISK AT HOME: Medicare Risk at Home Any stairs in or around the home?:  Yes If so, are there any without handrails?: No Home free of loose throw rugs in walkways, pet beds, electrical cords, etc?: Yes Adequate lighting in your home to reduce risk of falls?: Yes Life alert?: No Use of a cane, walker or w/c?: Yes Grab bars in the bathroom?: Yes Shower chair or bench in shower?: Yes Elevated toilet seat or a handicapped toilet?: Yes  TIMED UP AND GO:  Was the test performed?  No    Cognitive Function:    04/20/2018   10:29 AM  MMSE - Mini Mental State Exam  Orientation to time 5  Orientation to Place 5  Registration 3  Attention/ Calculation 4  Recall 1  Language- name 2 objects 2  Language- repeat 1  Language- follow 3 step command 3  Language- read & follow direction 1  Write a sentence 1  Copy design 1  Total score 27        09/02/2023    4:02 PM 08/19/2022    2:59 PM  6CIT Screen  What Year? 0 points 0 points  What month? 0 points 0 points  What time? 0 points 0 points  Count back from 20 0 points 0 points  Months in reverse 0 points 0 points  Repeat phrase 10 points 2 points  Total Score 10 points 2 points    Immunizations Immunization History  Administered Date(s) Administered   DTP 09/08/1998   Fluad Quad(high Dose 65+) 07/28/2019, 09/12/2020, 08/22/2021, 08/29/2022   Influenza Split 08/28/2011, 09/08/2012   Influenza Whole 09/07/2008, 09/02/2009, 09/06/2010   Influenza, High Dose Seasonal PF 09/14/2013, 08/29/2016, 09/09/2017, 08/24/2018   Influenza,inj,Quad PF,6+ Mos 08/04/2013, 09/08/2014, 09/22/2015   PFIZER(Purple Top)SARS-COV-2 Vaccination 12/28/2019, 01/18/2020, 07/28/2020   Pfizer Covid-19 Vaccine Bivalent Booster 52yrs & up 09/20/2021   Pfizer(Comirnaty)Fall Seasonal Vaccine 12 years and older 10/11/2022   Pneumococcal Conjugate-13 10/10/2014   Pneumococcal Polysaccharide-23 09/08/2001, 01/15/2010, 10/28/2018   Td 01/15/2010   Tdap 04/07/2023   Zoster, Live 10/16/2007, 05/05/2011    TDAP status: Up to  date  Flu Vaccine status: Due, Education has been provided regarding the importance of this vaccine. Advised may receive this vaccine at local pharmacy or Health Dept. Aware to provide a copy of the vaccination record if obtained from local pharmacy or Health Dept. Verbalized acceptance and understanding.  Pneumococcal vaccine status: Up to date  Covid-19 vaccine status: Information provided on how to obtain vaccines.   Qualifies for Shingles Vaccine? Yes   Zostavax completed Yes   Shingrix Completed?: No.    Education has been provided regarding the importance of this vaccine. Patient has been advised to call insurance company to determine out of pocket expense if they have not yet received this vaccine. Advised may also receive vaccine at local pharmacy or Health Dept. Verbalized acceptance and understanding.  Screening Tests Health Maintenance  Topic Date Due   Zoster Vaccines- Shingrix (1 of 2) 07/12/1950   INFLUENZA VACCINE  07/10/2023   COVID-19 Vaccine (6 - 2023-24 season) 08/10/2023   Medicare Annual Wellness (AWV)  09/01/2024   DTaP/Tdap/Td (4 - Td or Tdap) 04/06/2033   Pneumonia Vaccine 77+ Years old  Completed   HPV VACCINES  Aged Out    Health Maintenance  Health Maintenance Due  Topic  Date Due   Zoster Vaccines- Shingrix (1 of 2) 07/12/1950   INFLUENZA VACCINE  07/10/2023   COVID-19 Vaccine (6 - 2023-24 season) 08/10/2023    Colorectal cancer screening: No longer required.   Lung Cancer Screening: (Low Dose CT Chest recommended if Age 47-80 years, 20 pack-year currently smoking OR have quit w/in 15years.) does not qualify.   Lung Cancer Screening Referral: n/a  Additional Screening:  Hepatitis C Screening: does not qualify;   Vision Screening: Recommended annual ophthalmology exams for early detection of glaucoma and other disorders of the eye.  Dental Screening: Recommended annual dental exams for proper oral hygiene  Community Resource Referral / Chronic  Care Management: CRR required this visit?  No   CCM required this visit?  No     Plan:     I have personally reviewed and noted the following in the patient's chart:   Medical and social history Use of alcohol, tobacco or illicit drugs  Current medications and supplements including opioid prescriptions. Patient is not currently taking opioid prescriptions. Functional ability and status Nutritional status Physical activity Advanced directives List of other physicians Hospitalizations, surgeries, and ER visits in previous 12 months Vitals Screenings to include cognitive, depression, and falls Referrals and appointments  In addition, I have reviewed and discussed with patient certain preventive protocols, quality metrics, and best practice recommendations. A written personalized care plan for preventive services as well as general preventive health recommendations were provided to patient.     Tora Kindred, CMA   09/02/2023   After Visit Summary: (MyChart) Due to this being a telephonic visit, the after visit summary with patients personalized plan was offered to patient via MyChart   Nurse Notes:  6 CIT Score - 10 Will get flu and covid vaccines   Medical screening examination/treatment/procedure(s) were performed by non-physician practitioner and as supervising physician I was immediately available for consultation/collaboration.  I agree with above. Jacinta Shoe, MD

## 2023-09-15 ENCOUNTER — Other Ambulatory Visit (HOSPITAL_BASED_OUTPATIENT_CLINIC_OR_DEPARTMENT_OTHER): Payer: Self-pay

## 2023-09-15 DIAGNOSIS — Z23 Encounter for immunization: Secondary | ICD-10-CM | POA: Diagnosis not present

## 2023-09-15 MED ORDER — INFLUENZA VAC A&B SURF ANT ADJ 0.5 ML IM SUSY
0.5000 mL | PREFILLED_SYRINGE | Freq: Once | INTRAMUSCULAR | 0 refills | Status: AC
  Filled 2023-09-15: qty 0.5, 1d supply, fill #0

## 2023-09-15 MED ORDER — COVID-19 MRNA VAC-TRIS(PFIZER) 30 MCG/0.3ML IM SUSY
0.3000 mL | PREFILLED_SYRINGE | Freq: Once | INTRAMUSCULAR | 0 refills | Status: AC
  Filled 2023-09-15: qty 0.3, 1d supply, fill #0

## 2023-09-30 ENCOUNTER — Ambulatory Visit: Payer: Self-pay

## 2023-09-30 NOTE — Patient Outreach (Signed)
Care Coordination   Follow Up Visit Note   09/30/2023 Name: Jerry Gomez MRN: [id number] DOB: 1931/12/04  Jerry Gomez is a 89 y.o. year old male who sees Jerry Gomez, Jerry Quint, MD for primary care. I spoke with  Jerry Gomez and spouse Jerry Gomez phone) by phone today.  What matters to the patients health and wellness today?  [name] reports he continues to maintain fall prevention strategies. He states he is using walker to ambulate. He reports a good appetite. He denies any specific questions or concerns today.  Goals Addressed             This Visit's Progress    care coordination post rehab       Interventions Today    Flowsheet Row Most Recent Value  Chronic Disease   Chronic disease during today's visit Other  [osteoarthritis hips, post rehab/falls (d/c from [facility] 07/09/23)]  General Interventions   General Interventions Discussed/Reviewed General Interventions Reviewed, Doctor Visits  [Evaluation of current treatment plan for health condition and patient's adherence to plan]  Doctor Visits Discussed/Reviewed Doctor Visits Reviewed, PCP, Specialist  PCP/Specialist Visits Compliance with follow-up visit  [Reveiwed upcoming/scheduled appointments]  Education Interventions   Education Provided Provided Education  Provided Verbal Education On When to see the doctor, Medication  [advised to continue to eat healthy, attend provider visits as recommended, take medications as prescribed, contact provider with health questions/concerns as needed]  Nutrition Interventions   Nutrition Discussed/Reviewed Nutrition Discussed  Pharmacy Interventions   Pharmacy Dicussed/Reviewed Pharmacy Topics Reviewed  Safety Interventions   Safety Discussed/Reviewed Fall Risk, Safety Reviewed  [encouraged to continue fall prevention strategies]            SDOH assessments and interventions completed:  No  Care Coordination Interventions:  Yes, provided   Follow up  plan: Follow up call scheduled for 10/30/23    Encounter Outcome:  Patient Visit Completed   Kathyrn Sheriff, RN, MSN, BSN, CCM Care Management Coordinator [phone]   Kathyrn Sheriff, RN, MSN, BSN, CCM Care Management Coordinator [phone]

## 2023-09-30 NOTE — Patient Instructions (Signed)
Visit Information  Thank you for taking time to visit with me today. Please don't hesitate to contact me if I can be of assistance to you.   Following are the goals we discussed today:  Continue to take medications as prescribed. Continue to attend provider visits as scheduled Continue to eat healthy, lean meats, vegetables, fruits, avoid saturated and transfats Continue fall prevention strategies-use your walker as recommended by therapist Contact provider with health questions or concerns as needed   Our next appointment is by telephone on 10/30/23 at 10:30 am  Please call the care guide team at [phone] if you need to cancel or reschedule your appointment.   If you are experiencing a Mental Health or Behavioral Health Crisis or need someone to talk to, please call the Suicide and Crisis Lifeline: 988 call the [place] National Suicide Prevention Lifeline: 4382207996 or TTY: 281-574-0120 TTY (934)134-6048) to talk to a trained counselor call 1-800-273-TALK (toll free, 24 hour hotline)   Kathyrn Sheriff, RN, MSN, BSN, CCM Care Management Coordinator [phone]

## 2023-10-02 ENCOUNTER — Ambulatory Visit: Payer: Medicare Other | Admitting: Internal Medicine

## 2023-10-16 ENCOUNTER — Encounter: Payer: Self-pay | Admitting: Internal Medicine

## 2023-10-16 ENCOUNTER — Ambulatory Visit: Payer: Medicare Other | Admitting: Internal Medicine

## 2023-10-16 VITALS — BP 130/68 | HR 76 | Temp 98.1°F | Ht 66.0 in | Wt 142.0 lb

## 2023-10-16 DIAGNOSIS — M5416 Radiculopathy, lumbar region: Secondary | ICD-10-CM | POA: Diagnosis not present

## 2023-10-16 DIAGNOSIS — M4807 Spinal stenosis, lumbosacral region: Secondary | ICD-10-CM

## 2023-10-16 DIAGNOSIS — E785 Hyperlipidemia, unspecified: Secondary | ICD-10-CM

## 2023-10-16 DIAGNOSIS — C9110 Chronic lymphocytic leukemia of B-cell type not having achieved remission: Secondary | ICD-10-CM

## 2023-10-16 DIAGNOSIS — M1990 Unspecified osteoarthritis, unspecified site: Secondary | ICD-10-CM

## 2023-10-16 NOTE — Assessment & Plan Note (Signed)
Monitor CBC/WBC 

## 2023-10-16 NOTE — Assessment & Plan Note (Signed)
Legs weakness - gradual worsening - likely due to spinal stenosis, possibly aggravated by statin therapy - can tolerate Lipitor once a week.  We d/c'd Lipitor

## 2023-10-16 NOTE — Assessment & Plan Note (Signed)
Tylenol prn 

## 2023-10-16 NOTE — Progress Notes (Signed)
Subjective:  Patient ID: Jerry Gomez, male    DOB: 12-15-1930  Age: 89 y.o. MRN: [id number]  CC: Medical Management of Chronic Issues (3 MNTH F/U)   HPI Jerry Gomez presents for gait disorder, OA, CLL f/u  Outpatient Medications Prior to Visit  Medication Sig Dispense Refill   Artificial Tear Ointment (ARTIFICIAL TEARS) ointment Place 1 drop into both eyes daily as needed (dry eyes).     aspirin 81 MG EC tablet Take 81 mg by mouth daily. Swallow whole.     Cholecalciferol (VITAMIN D) 2000 units tablet Take 2,000 Units by mouth daily.     Ensure (ENSURE) Take 237 mLs by mouth daily.     finasteride (PROSCAR) 5 MG tablet Take 5 mg by mouth daily.     furosemide (LASIX) 20 MG tablet TAKE 1 TABLET BY MOUTH EVERY DAY 90 tablet 1   Multiple Vitamins-Minerals (MULTIVITAMIN ADULT) CHEW Take one qod 100 tablet 3   Multiple Vitamins-Minerals (PRESERVISION AREDS PO) Take 1 capsule by mouth daily at 6 (six) AM.     Vibegron (GEMTESA) 75 MG TABS Take 1 tablet (75 mg total) by mouth daily. 30 tablet 5   vitamin B-12 (CYANOCOBALAMIN) 500 MCG tablet Take 500 mcg by mouth daily.     B Complex-Folic Acid (B COMPLEX PLUS) TABS Take 1 tablet by mouth every other day. (Patient not taking: Reported on 08/01/2023) 100 tablet 3   mupirocin ointment (BACTROBAN) 2 % ON LEG WOUND W/DRESSING CHANGE ONCE OR TWICE DAILY (Patient not taking: Reported on 09/02/2023) 22 g 1   COVID-19 mRNA vaccine 2023-2024 (COMIRNATY) syringe Inject into the muscle. (Patient not taking: Reported on 09/02/2023) 0.3 mL 0   doxycycline (VIBRAMYCIN) 100 MG capsule Take by mouth. (Patient not taking: Reported on 08/01/2023)     No facility-administered medications prior to visit.    ROS: Review of Systems  Constitutional:  Negative for appetite change, fatigue and unexpected weight change.  HENT:  Negative for congestion, nosebleeds, sneezing, sore throat and trouble swallowing.   Eyes:  Negative for itching and visual disturbance.   Respiratory:  Negative for cough.   Cardiovascular:  Negative for chest pain, palpitations and leg swelling.  Gastrointestinal:  Negative for abdominal distention, blood in stool, diarrhea and nausea.  Genitourinary:  Negative for frequency and hematuria.  Musculoskeletal:  Positive for arthralgias and gait problem. Negative for back pain, joint swelling and neck pain.  Skin:  Negative for rash.  Neurological:  Negative for dizziness, tremors, speech difficulty and weakness.  Psychiatric/Behavioral:  Negative for agitation, dysphoric mood, sleep disturbance and suicidal ideas. The patient is not nervous/anxious.     Objective:  BP 130/68 (BP Location: Left Arm, Patient Position: Sitting, Cuff Size: Normal)   Pulse 76   Temp 98.1 F (36.7 C) (Oral)   Ht 5\' 6"  (1.676 m)   Wt 142 lb (64.4 kg)   SpO2 97%   BMI 22.92 kg/m   BP Readings from Last 3 Encounters:  10/16/23 130/68  07/28/23 (!) 110/50  07/16/23 100/60    Wt Readings from Last 3 Encounters:  10/16/23 142 lb (64.4 kg)  09/02/23 131 lb (59.4 kg)  07/28/23 133 lb 1.6 oz (60.4 kg)    Physical Exam Constitutional:      General: He is not in acute distress.    Appearance: He is well-developed.     Comments: NAD  Eyes:     Conjunctiva/sclera: Conjunctivae normal.     Pupils: Pupils are equal,  round, and reactive to light.  Neck:     Thyroid: No thyromegaly.     Vascular: No JVD.  Cardiovascular:     Rate and Rhythm: Normal rate and regular rhythm.     Heart sounds: Normal heart sounds. No murmur heard.    No friction rub. No gallop.  Pulmonary:     Effort: Pulmonary effort is normal. No respiratory distress.     Breath sounds: Normal breath sounds. No wheezing or rales.  Chest:     Chest wall: No tenderness.  Abdominal:     General: Bowel sounds are normal. There is no distension.     Palpations: Abdomen is soft. There is no mass.     Tenderness: There is no abdominal tenderness. There is no guarding or  rebound.  Musculoskeletal:        General: No tenderness. Normal range of motion.     Cervical back: Normal range of motion.  Lymphadenopathy:     Cervical: No cervical adenopathy.  Skin:    General: Skin is warm and dry.     Findings: No rash.  Neurological:     Mental Status: He is alert and oriented to person, place, and time.     Cranial Nerves: No cranial nerve deficit.     Motor: No abnormal muscle tone.     Coordination: Coordination normal.     Gait: Gait normal.     Deep Tendon Reflexes: Reflexes are normal and symmetric.  Psychiatric:        Behavior: Behavior normal.        Thought Content: Thought content normal.        Judgment: Judgment normal.     Lab Results  Component Value Date   WBC 40.6 (H) 07/28/2023   HGB 12.4 (L) 07/28/2023   HCT 38.7 (L) 07/28/2023   PLT 146 (L) 07/28/2023   GLUCOSE 110 (H) 07/28/2023   CHOL 119 08/14/2021   TRIG 57.0 08/14/2021   HDL 53.30 08/14/2021   LDLCALC 55 08/14/2021   ALT 10 07/28/2023   AST 16 07/28/2023   NA 140 07/28/2023   K 4.1 07/28/2023   CL 103 07/28/2023   CREATININE 0.70 07/28/2023   BUN 19 07/28/2023   CO2 32 07/28/2023   TSH 1.33 04/30/2022   PSA 8.51 (H) 08/14/2021    CT Cervical Spine Wo Contrast  Result Date: 06/15/2023 CLINICAL DATA:  Status post multiple falls. EXAM: CT CERVICAL SPINE WITHOUT CONTRAST TECHNIQUE: Multidetector CT imaging of the cervical spine was performed without intravenous contrast. Multiplanar CT image reconstructions were also generated. RADIATION DOSE REDUCTION: This exam was performed according to the departmental dose-optimization program which includes automated exposure control, adjustment of the mA and/or kV according to patient size and/or use of iterative reconstruction technique. COMPARISON:  None Available. FINDINGS: Alignment: There is approximally 4 mm, chronic appearing retrolisthesis of the C5 vertebral body on C6. Skull base and vertebrae: No acute fracture. No primary  bone lesion or focal pathologic process. Soft tissues and spinal canal: No prevertebral fluid or swelling. No visible canal hematoma. Disc levels: Marked severity endplate sclerosis is seen at the levels of C3-C4 and C5-C6, with mild to moderate severity endplate sclerosis noted throughout the remainder of the cervical spine. Marked severity anterior osteophyte formation and posterior bony spurring are also seen at C5-C6. There is marked severity narrowing of the anterior atlantoaxial articulation. Marked severity intervertebral disc space narrowing is seen at C5-C6. Moderate severity intervertebral disc space narrowing is present  at the levels of C3-C4, C4-C5 and C6-C7. Bilateral marked severity multilevel facet joint hypertrophy is noted. Upper chest: Negative. Other: None. IMPRESSION: 1. Marked severity multilevel degenerative changes, most prominent at the levels of C3-C4 and C5-C6. 2. Chronic appearing retrolisthesis of the C5 vertebral body on C6. 3. No acute cervical spine fracture. Electronically Signed   By: Aram Candela M.D.   On: 06/15/2023 01:08   CT Head Wo Contrast  Result Date: 06/15/2023 CLINICAL DATA:  Multiple falls. EXAM: CT HEAD WITHOUT CONTRAST TECHNIQUE: Contiguous axial images were obtained from the base of the skull through the vertex without intravenous contrast. RADIATION DOSE REDUCTION: This exam was performed according to the departmental dose-optimization program which includes automated exposure control, adjustment of the mA and/or kV according to patient size and/or use of iterative reconstruction technique. COMPARISON:  October 24, 2022 FINDINGS: Brain: There is mild cerebral atrophy with widening of the extra-axial spaces and ventricular dilatation. There are areas of decreased attenuation within the white matter tracts of the supratentorial brain, consistent with microvascular disease changes. Vascular: No hyperdense vessel or unexpected calcification. Skull: Normal.  Negative for fracture or focal lesion. Sinuses/Orbits: No acute finding. Other: None. IMPRESSION: 1. Generalized cerebral atrophy with chronic white matter small vessel ischemic changes. 2. No acute intracranial abnormality. Electronically Signed   By: Aram Candela M.D.   On: 06/15/2023 01:00   DG Pelvis 1-2 Views  Result Date: 06/15/2023 CLINICAL DATA:  Status post multiple falls. EXAM: PELVIS - 1-2 VIEW COMPARISON:  January 10, 2022 FINDINGS: There is no evidence of an acute pelvic fracture or diastasis. Marked severity degenerative changes are seen involving both hips, in the form of joint space narrowing, subchondral cysts formation and acetabular sclerosis. Marked severe flattening of the bilateral femoral heads is also seen. IMPRESSION: Marked severity degenerative changes involving both hips, with additional findings consistent with sequelae related to avascular necrosis of the bilateral femoral heads. Electronically Signed   By: Aram Candela M.D.   On: 06/15/2023 00:59   DG Shoulder Left  Result Date: 06/15/2023 CLINICAL DATA:  Status post multiple falls. EXAM: LEFT SHOULDER - 2+ VIEW COMPARISON:  None Available. FINDINGS: There is no evidence of an acute fracture or dislocation. Moderate severity degenerative changes seen involving the left acromioclavicular joint and left glenohumeral articulation. Mild lateral soft tissue swelling is noted. IMPRESSION: Moderate severity degenerative changes without an acute fracture or dislocation. Electronically Signed   By: Aram Candela M.D.   On: 06/15/2023 00:56   DG Elbow Complete Left  Result Date: 06/15/2023 CLINICAL DATA:  Status post multiple falls. EXAM: LEFT ELBOW - COMPLETE 3+ VIEW COMPARISON:  None Available. FINDINGS: There is no evidence of an acute fracture, dislocation, or joint effusion. Chronic and degenerative changes are seen involving the mediolateral epicondyles of the distal left humerus. Soft tissues are unremarkable.  IMPRESSION: Chronic and degenerative changes without an acute fracture or dislocation. Electronically Signed   By: Aram Candela M.D.   On: 06/15/2023 00:55   DG Chest 2 View  Result Date: 06/15/2023 CLINICAL DATA:  Multiple falls. EXAM: CHEST - 2 VIEW COMPARISON:  None Available. FINDINGS: The heart size and mediastinal contours are within normal limits. There is moderate severity calcification of the aortic arch. There is no evidence of an acute infiltrate, pleural effusion or pneumothorax. Multilevel degenerative changes seen throughout the thoracic spine. IMPRESSION: No active cardiopulmonary disease. Electronically Signed   By: Aram Candela M.D.   On: 06/15/2023 00:54  Assessment & Plan:   Problem List Items Addressed This Visit     Dyslipidemia     Legs weakness - gradual worsening - likely due to spinal stenosis, possibly aggravated by statin therapy - can tolerate Lipitor once a week.  We d/c'd Lipitor      Osteoarthritis    In a w/c       CLL (chronic lymphocytic leukemia) (HCC)    Monitor CBC/WBC      Lumbar radiculopathy - Primary    Use a Rollator walker      Spinal stenosis    Tylenol prn         No orders of the defined types were placed in this encounter.     Follow-up: Return in about 3 months (around 01/16/2024) for a follow-up visit.  Sonda Primes, MD

## 2023-10-16 NOTE — Assessment & Plan Note (Signed)
Use a Rollator walker

## 2023-10-16 NOTE — Assessment & Plan Note (Signed)
In a w/c 

## 2023-10-30 ENCOUNTER — Ambulatory Visit: Payer: Self-pay

## 2023-10-30 NOTE — Patient Outreach (Signed)
  Care Coordination   Follow Up Visit Note   10/30/2023 Name: Jerry Gomez MRN: [id number] DOB: 08-Jun-1931  Jerry Gomez is a 89 y.o. year old male who sees [name], Georgina Quint, MD for primary care. I spoke with  Jerry Gomez by phone today.  What matters to the patients health and wellness today?  Mr. Bradney reports he is doing well. Office visit with PCP completed on 10/16/23. He states he has all his medications and is taking as prescribed. Medications reviewed with patient. No medication needs noted. No care management needs identified at this time. Patient with no questions or concerns. Patient to contact RNCM or primary care provider, if care management needs in the future.  Goals Addressed             This Visit's Progress    COMPLETED: care coordination post rehab       Interventions Today    Flowsheet Row Most Recent Value  Chronic Disease   Chronic disease during today's visit Other  [(osteoarthritis hips, post rehab/falls (d/c from [facility] 07/09/23)]  General Interventions   General Interventions Discussed/Reviewed General Interventions Reviewed  [Evaluation of current treatment plan for health condition and patient's adherence to plan. Provided RNCM contact number and encouraged patient to call or contact PCP if care management needs in the future.]  Education Interventions   Education Provided Provided Education  Provided Verbal Education On When to see the doctor, Medication  [advised to continue to take medications as prescribed, attend provider visits as scheduled, contact provider with health questions or concerns as needed]  Nutrition Interventions   Nutrition Discussed/Reviewed Nutrition Discussed  Pharmacy Interventions   Pharmacy Dicussed/Reviewed Pharmacy Topics Reviewed  Safety Interventions   Safety Discussed/Reviewed Safety Discussed  [discussed fall safety, encouraged to continue to use assistive device-rollator walker when ambulating]             SDOH assessments and interventions completed:  No  Care Coordination Interventions:  Yes, provided   Follow up plan: No further intervention required.   Encounter Outcome:  Patient Visit Completed   Kathyrn Sheriff, RN, MSN, BSN, CCM Care Management Coordinator [phone]

## 2023-10-30 NOTE — Patient Instructions (Signed)
Visit Information  Thank you for taking time to visit with me today. Please don't hesitate to contact me if I can be of assistance to you.   Following are the goals we discussed today:  Continue to take medications as prescribed. Continue fall prevention strategies Continue to attend provider visits as scheduled Continue to eat healthy, lean meats, vegetables, fruits, avoid saturated and transfats Contact provider with health questions or concerns as needed  If you are experiencing a Mental Health or Behavioral Health Crisis or need someone to talk to, please call the Suicide and Crisis Lifeline: 988 call the [place] National Suicide Prevention Lifeline: 626 577 3368 or TTY: 8067080917 TTY (301)285-7033) to talk to a trained counselor call 1-800-273-TALK (toll free, 24 hour hotline)  Kathyrn Sheriff, RN, MSN, BSN, CCM Care Management Coordinator [phone]

## 2023-11-09 ENCOUNTER — Encounter: Payer: Self-pay | Admitting: Internal Medicine

## 2023-11-12 NOTE — Progress Notes (Unsigned)
I saw Jerry Gomez in neurology clinic on 11/20/23 in follow up for weakness.  HPI: Jerry Gomez is a 89 y.o. year old right-handed male with a medical history of CLL, OA, low back pain with left sided sciatica, avascular necrosis in bilateral hips, HTN who we last saw on 05/21/23.  To briefly review: 05/21/23: Patient's symptoms started about 1.5 years ago. He had a fall in 12/2021. Patient was getting off the toilet and his legs gave out, so he fell. EMS had to be called to get patient up. Patient was evaluated at the ED for 12 hours. One week later patient had a similar fall after getting up from the toilet (01/2022). He stayed overnight. Per discharge summary: MRI spine was done and results were discussed with neurosurgery, Dr. Yetta Barre who noted L4/L5 moderate spinal canal stenosis, possible impingement of right L4 nerve root, unchanged from April 2022, and didn't feel like the findings explained his symptoms from that visit. MRI brain and DVT of RLE was also done and both negative. In the ED, VSS, labs with WBC 35 (chronic from CLL), pelvic xray: changes of avascular necrosis in bilateral femoral heads with subchondral cystic change and flattening. Severe joint space narrowing of both hips, right greater than left. Bone on bone on right.  Orthopedics consulted, recommend outpatient follow-up.  PT OT recommend home health PT/OT.    Patient was seen by ortho who recommended bilateral hip replacement. Patient and family declined due to age. Patient's mobility has worsened, particularly over the last 6 months. Patient is currently ambulating with a walker. Family does have a wheelchair which he uses when he goes out. He has fallen twice this year (over the last few months, in the bedroom falling backward). Patient had a fall requiring ED visit on 04/07/23 when he fell backwards and cut his left arm and hand. He required stitches. He has been able to get himself up off the ground. He does not think he has  significant pain in his legs or numbness and tingling. He is having hip pain. He thinks the right leg is weaker than the left leg. Right hip was worse on xrays.   Regarding his hands, over the last year or so, patient has great difficulty with weakness. He cannot write well. He feels his right hand is weaker than left. He has difficulty buttoning buttons. He can open bottles, but sometimes has difficulty. He does not have noticeable pain. He describes stiffness and some numbness.   Patient did PT during early 2023, including pool therapy. He last did this last year. He is much less active now.   He denies significant neck or back pain.   Patient is on B complex. He is not on other B12 supplement.    Most recent assessment and plan by Dr. Arbutus Ped in oncology from 01/27/23: This is a very pleasant 89 years old white male with a stage 0 chronic lymphocytic leukemia.  He is currently on observation.  The patient is doing fine today with no concerning complaints except for the mild fatigue. Repeat CBC today showed elevated white blood count of 38.7.  Hemoglobin was 12.5 and platelets 38.1% with platelet count of 141,000. I recommended for the patient to continue on observation with repeat blood work and 6 months. He was advised to call immediately if he has any concerning symptoms in the interval. All questions were answered. The patient knows to call the clinic with any problems, questions or concerns. We can  certainly see the patient much sooner if necessary.   He does not report any constitutional symptoms like fever, night sweats, anorexia or unintentional weight loss.    He has lost a lot of weight over the last couple of years per family because patient is not eating as much and much less active.   EtOH use: Daily 1-2 glasses of wine or margarita   Restrictive diet? No  Most recent Assessment and Plan (05/21/23): Jerry Gomez is a 89 y.o. male who presents for evaluation of leg weakness and  right greater than left hand weakness. He has a relevant medical history of CLL, OA, low back pain with left sided sciatica, avascular necrosis in bilateral hips, HTN. His neurological examination is pertinent for right hand weakness and difficulty standing and ambulating. Available diagnostic data is significant for MRI lumbar spine showing spinal stenosis and multilevel foraminal stenosis. Cervical spine xray showed moderate to severe degenerative changes. Hip xray showed avascular necrosis of bilateral femoral heads and severe bilateral hip degenerative changes.   Patient's inability to walk and leg weakness is likely multifactorial with contributions from avascular necrosis and severe hip degenerative changes, lumbar radiculopathy, and deconditioning. The weakness in his hands are more clear in the right hand and are in muscles all of C8 or T1 distribution. He does not endorse neck pain, but I suspect this may be cervical radiculopathy. I would like to get an MRI of the cervical spine to evaluate for this and ensure there is no cervical stenosis contributing to difficulty with legs.   PLAN: -MRI cervical spine wo contrast -Referral to PT - Patient has previously seen Tomma [facility] at [facility] for pool therapy and would like to do this again if able. Pool therapy would be an excellent option given his hip problems. -B12 supplementation with 1000 mcg daily  Since their last visit: ***  Patient did not get MRI cervical spine, but did get CT cervical spine that showed multilevel changes, most prominent at C3-4 and C5-6.  PT?***  Leg weakness worsening per PCP note***  ROS: Pertinent positive and negative systems reviewed in HPI. ***   MEDICATIONS:  Outpatient Encounter Medications as of 11/20/2023  Medication Sig Note   Artificial Tear Ointment (ARTIFICIAL TEARS) ointment Place 1 drop into both eyes daily as needed (dry eyes). 01/10/2022: prn   aspirin 81 MG EC tablet Take 81 mg by mouth daily.  Swallow whole.    B Complex-Folic Acid (B COMPLEX PLUS) TABS Take 1 tablet by mouth every other day. (Patient not taking: Reported on 08/01/2023)    Cholecalciferol (VITAMIN D) 2000 units tablet Take 2,000 Units by mouth daily.    Ensure (ENSURE) Take 237 mLs by mouth daily.    finasteride (PROSCAR) 5 MG tablet Take 5 mg by mouth daily.    furosemide (LASIX) 20 MG tablet TAKE 1 TABLET BY MOUTH EVERY DAY 09/02/2023: Takes qod   Multiple Vitamins-Minerals (MULTIVITAMIN ADULT) CHEW Take one qod    Multiple Vitamins-Minerals (PRESERVISION AREDS PO) Take 1 capsule by mouth daily at 6 (six) AM.    mupirocin ointment (BACTROBAN) 2 % ON LEG WOUND W/DRESSING CHANGE ONCE OR TWICE DAILY (Patient not taking: Reported on 09/02/2023)    Vibegron (GEMTESA) 75 MG TABS Take 1 tablet (75 mg total) by mouth daily.    vitamin B-12 (CYANOCOBALAMIN) 500 MCG tablet Take 500 mcg by mouth daily.    No facility-administered encounter medications on file as of 11/20/2023.    PAST MEDICAL HISTORY: Past Medical  History:  Diagnosis Date   Cancer (HCC)    CLL   Cataract    COLONIC POLYPS 03/02/2009   EYE SURGERY, HX OF 09/05/2007   HEMORRHOIDS, HX OF    HYPERLIPIDEMIA 09/05/2007   HYPERTENSION 07/25/2007   OSTEOARTHRITIS 09/05/2007   SLEEP APNEA, OBSTRUCTIVE 09/05/2007    PAST SURGICAL HISTORY: Past Surgical History:  Procedure Laterality Date   EYE SURGERY  10/22/13   left eye, cataract    ALLERGIES: No Known Allergies  FAMILY HISTORY: Family History  Problem Relation Age of Onset   Other Mother 72       natural causes   Kidney disease Father    Cancer Father        prostate-late on-set   Cancer Brother        non-hodgkins lymphoma   Colon cancer Neg Hx    Esophageal cancer Neg Hx    Rectal cancer Neg Hx    Stomach cancer Neg Hx     SOCIAL HISTORY: Social History   Tobacco Use   Smoking status: Former    Current packs/day: 0.00    Types: Cigarettes    Quit date: 02/07/1963    Years since  quitting: 60.8    Passive exposure: Past   Smokeless tobacco: Never   Tobacco comments:    Rarely smoked between ages [age]   Vaping Use   Vaping status: Never Used  Substance Use Topics   Alcohol use: Yes    Alcohol/week: 14.0 standard drinks of alcohol    Types: 14 Standard drinks or equivalent per week    Comment: 1-2 margarita nightly   Drug use: No   Social History   Social History Narrative   [employer], [place]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 Married '54. 2 sons ' 63, '67. 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 CPR- yes, short-term mechanical ventilation, no prolonged heroic or futile measures.       Right hand   Leaving 2 level home   Drink coffee and ice tea    Objective:  Vital Signs:  There were no vitals taken for this visit.  General:*** General appearance: Awake and alert. No distress. Cooperative with exam.  Skin: No obvious rash or jaundice. HEENT: Atraumatic. Anicteric. Lungs: Non-labored breathing on room air  Heart: Regular Abdomen: Soft, non tender. Extremities: No edema. No obvious deformity.  Musculoskeletal: No obvious joint swelling.  Neurological: Mental Status: Alert. Speech fluent. No pseudobulbar affect Cranial Nerves: CNII: No RAPD. Visual fields intact. CNIII, IV, VI: PERRL. No nystagmus. EOMI. CN V: Facial sensation intact bilaterally to fine touch. [place] clench strong. Jaw jerk***. CN VII: Facial muscles symmetric and strong. No ptosis at rest or after sustained upgaze***. CN VIII: Hears finger rub well bilaterally. CN IX: No hypophonia. CN X: Palate elevates symmetrically. CN XI: Full strength shoulder shrug bilaterally. CN XII: Tongue protrusion full and midline. No atrophy or fasciculations. No significant dysarthria*** Motor: Tone is ***. *** fasciculations in *** extremities. *** atrophy. No grip or percussive myotonia.  Individual muscle group testing (MRC grade out of 5):  Movement     Neck  flexion ***    Neck extension ***     Right Left   Shoulder abduction *** ***   Shoulder adduction *** ***   Shoulder ext rotation *** ***   Shoulder int rotation *** ***   Elbow flexion *** ***   Elbow extension *** ***   Wrist extension *** ***   Wrist flexion *** ***  Finger abduction - FDI *** ***   Finger abduction - ADM *** ***   Finger extension *** ***   Finger distal flexion - 2/3 *** ***   Finger distal flexion - 4/5 *** ***   Thumb flexion - FPL *** ***   Thumb abduction - APB *** ***    Hip flexion *** ***   Hip extension *** ***   Hip adduction *** ***   Hip abduction *** ***   Knee extension *** ***   Knee flexion *** ***   Dorsiflexion *** ***   Plantarflexion *** ***   Inversion *** ***   Eversion *** ***   Great toe extension *** ***   Great toe flexion *** ***     Reflexes:  Right Left  Bicep *** ***  Tricep *** ***  BrRad *** ***  Knee *** ***  Ankle *** ***   Pathological Reflexes: Babinski: *** response bilaterally*** Hoffman: *** Troemner: *** Pectoral: *** Palmomental: *** Facial: *** Midline tap: *** Sensation: Pinprick: *** Vibration: *** Temperature: *** Proprioception: *** Coordination: Intact finger-to- nose-finger and heel-to-shin bilaterally. Romberg negative.*** Gait: Able to rise from chair with arms crossed unassisted. Normal, narrow-based gait. Able to tandem walk. Able to walk on toes and heels.***   Lab and Test Review: New results: 07/28/23: CBC w/ diff significant for chronically elevated WBC CMP unremarkable  CT head wo contrast (06/15/23): FINDINGS: Brain: There is mild cerebral atrophy with widening of the extra-axial spaces and ventricular dilatation. There are areas of decreased attenuation within the white matter tracts of the supratentorial brain, consistent with microvascular disease changes.   Vascular: No hyperdense vessel or unexpected calcification.   Skull: Normal. Negative for fracture or focal  lesion.   Sinuses/Orbits: No acute finding.   Other: None.   IMPRESSION: 1. Generalized cerebral atrophy with chronic white matter small vessel ischemic changes. 2. No acute intracranial abnormality.  CT cervical spine wo contrast (06/15/23): FINDINGS: Alignment: There is approximally 4 mm, chronic appearing retrolisthesis of the C5 vertebral body on C6.   Skull base and vertebrae: No acute fracture. No primary bone lesion or focal pathologic process.   Soft tissues and spinal canal: No prevertebral fluid or swelling. No visible canal hematoma.   Disc levels: Marked severity endplate sclerosis is seen at the levels of C3-C4 and C5-C6, with mild to moderate severity endplate sclerosis noted throughout the remainder of the cervical spine. Marked severity anterior osteophyte formation and posterior bony spurring are also seen at C5-C6.   There is marked severity narrowing of the anterior atlantoaxial articulation. Marked severity intervertebral disc space narrowing is seen at C5-C6. Moderate severity intervertebral disc space narrowing is present at the levels of C3-C4, C4-C5 and C6-C7.   Bilateral marked severity multilevel facet joint hypertrophy is noted.   Upper chest: Negative.   Other: None.   IMPRESSION: 1. Marked severity multilevel degenerative changes, most prominent at the levels of C3-C4 and C5-C6. 2. Chronic appearing retrolisthesis of the C5 vertebral body on C6. 3. No acute cervical spine fracture.  Previously reviewed results: 03/25/23: ESR wnl B12: 243 CK: 118   CMP (01/27/23): wnl CBC (01/27/23): significant for elevated WBC (38.7) and lymphs (34.9), mildly low Hb and plts   Cervical spine xray (10/24/22): FINDINGS: Spinal alignment is normal. There is no acute fracture. There is moderate disc space narrowing at C3-C4, C4-C5 and C6-C7. There is severe disc space narrowing at C5-C6. Endplate osteophytes are seen throughout. There is facet  arthropathy C  at C2-C3, C3-C4 and C4-C5 causing some mild bilateral neural foraminal stenosis. C1-C2 intervals within normal limits on the open-mouth view. There is no prevertebral soft tissue swelling.   IMPRESSION: 1. No acute fracture or malalignment. 2. Moderate to severe multilevel degenerative changes as described.   CT head wo contrast (10/24/22): FINDINGS: Brain: Generalized atrophy. Normal ventricular morphology. No midline shift or mass effect. Small vessel chronic ischemic changes of deep cerebral white matter. No intracranial hemorrhage, mass lesion, evidence of acute infarction, or extra-axial fluid collection.   Vascular: No hyperdense vessels. Atherosclerotic calcifications of internal carotid arteries at skull base   Skull: Intact   Sinuses/Orbits: Clear   Other: N/A   IMPRESSION: Atrophy with small vessel chronic ischemic changes of deep cerebral white matter.   No acute intracranial abnormalities.   MRI brain wo contrast (01/02/22): FINDINGS: There is residual contrast material from today's earlier lumbar spine MRI.   Brain: There is no evidence of an acute infarct, intracranial hemorrhage, mass, midline shift, or extra-axial fluid collection. T2 hyperintensities in the cerebral white matter bilaterally are nonspecific but compatible with mild-to-moderate chronic small vessel ischemic disease. There is moderate cerebral atrophy.   Vascular: Major intracranial vascular flow voids are preserved.   Skull and upper cervical spine: Unremarkable bone marrow signal para   Sinuses/Orbits: Bilateral cataract extraction. Mild mucosal thickening in the paranasal sinuses. Clear mastoid air cells.   Other: None.   IMPRESSION: 1. No acute intracranial abnormality. 2. Mild-to-moderate chronic small vessel ischemic disease and cerebral atrophy.   MRI lumbar spine w/wo contrast (01/02/22): FINDINGS: Segmentation: Standard; the lowest formed disc space is  designated L5-S1.   Alignment: There is grade 1 retrolisthesis of L2 on L3 and grade 1 anterolisthesis of L4 on L5, unchanged. There is slight dextrocurvature centered at L3-L4. Alignment is otherwise normal.   Vertebrae: Vertebral body heights are preserved. Marrow signal is normal. There is no abnormal marrow edema or enhancement.   Conus medullaris and cauda equina: Conus extends to the T12-L1 level. Conus and cauda equina appear normal.   Paraspinal and other soft tissues: A lobulated T2 hyperintense lesion in the right kidney is unchanged, likely reflecting a cyst. The paraspinal soft tissues are unremarkable.   Disc levels:   There is multilevel disc desiccation and narrowing, most advanced at L3-L4 and L5-S1. There is multilevel facet arthropathy, most advanced at L4-L5. There is an associated small effusion on the left. Findings are not significantly changed   T12-L1: No significant spinal canal or neural foraminal stenosis   L1-L2: There is a mild disc bulge and left worse than right facet arthropathy resulting in mild left and no significant right neural foraminal stenosis and no significant spinal canal stenosis, unchanged.   L2-L3: There is grade 1 retrolisthesis with a mild bulge and mild bilateral facet arthropathy resulting in mild narrowing of the left subarticular zone and mild left and no significant right neural foraminal stenosis, not significantly changed.   L3-L4: There is a mild disc bulge and mild bilateral facet arthropathy resulting in mild left and no significant right neural foraminal stenosis and no significant spinal canal stenosis, unchanged.   L4-L5: There is grade 1 anterolisthesis with a mild bulge, ligamentum flavum thickening, and moderate bilateral facet arthropathy resulting in moderate spinal canal stenosis with crowding of the cauda equina nerve roots and subarticular zones with possible impingement of the traversing right L4 nerve  root (7-32), and mild bilateral neural foraminal stenosis. Findings are unchanged.   L5-S1: There  is a mild disc bulge and left worse than right facet arthropathy resulting in narrowing of the bilateral subarticular zones, right more than left, and moderate right and no significant left neural foraminal stenosis. Extraforaminal disc material again contacts the exiting right L5 nerve root (9-5). Findings are unchanged.   IMPRESSION: 1. Mild disc bulge, ligamentum flavum thickening, and bulky facet arthropathy at L4-L5 resulting in moderate spinal canal stenosis with crowding of the subarticular zones and cauda equina nerve roots with possible impingement of the traversing right L4 nerve root, and mild bilateral neural foraminal stenosis, unchanged since 03/22/2021. 2. Disc bulge and bilateral facet arthropathy at L5-S1 resulting in narrowing of the bilateral subarticular zones, right worse than left, and contact of the exiting right L5 nerve root by extraforaminal disc material, also not significantly changed. 3. Mild degenerative changes at the remaining levels as detailed above without other significant spinal canal or neural foraminal stenosis, and no other evidence of nerve root impingement.   Xray of pelvis (01/10/22): FINDINGS: The bones are diffusely markedly osteopenic. There is no evidence for acute fracture or dislocation. There are changes of avascular necrosis in the bilateral femoral heads with subchondral cystic change and flattening. There is severe joint space narrowing of both hips, right greater than left. There is bone-on-bone configuration on the right. Soft tissues are within normal limits.   IMPRESSION: 1. No acute fracture or dislocation. 2. 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 3. Severe bilateral hip degenerative change, right greater than left. 4. Diffuse osteopenia.  ASSESSMENT: This is Jerry Gomez, a 89 y.o. male  with:  ***  Plan: ***  Return to clinic in ***  Total time spent reviewing records, interview, history/exam, documentation, and coordination of care on day of encounter:  *** min  Jacquelyne Balint, MD

## 2023-11-20 ENCOUNTER — Ambulatory Visit: Payer: Medicare Other | Admitting: Neurology

## 2023-11-27 ENCOUNTER — Other Ambulatory Visit: Payer: Self-pay | Admitting: Internal Medicine

## 2023-12-12 ENCOUNTER — Telehealth: Payer: Self-pay | Admitting: Internal Medicine

## 2023-12-12 NOTE — Telephone Encounter (Signed)
 Rescheduled appointments per provider on call. Patient is aware of changes made to the appointments and will be mailed an appointment reminder.

## 2023-12-24 DIAGNOSIS — N401 Enlarged prostate with lower urinary tract symptoms: Secondary | ICD-10-CM | POA: Diagnosis not present

## 2023-12-24 DIAGNOSIS — R351 Nocturia: Secondary | ICD-10-CM | POA: Diagnosis not present

## 2023-12-24 DIAGNOSIS — R3915 Urgency of urination: Secondary | ICD-10-CM | POA: Diagnosis not present

## 2024-01-19 ENCOUNTER — Encounter: Payer: Self-pay | Admitting: Internal Medicine

## 2024-01-19 ENCOUNTER — Ambulatory Visit (INDEPENDENT_AMBULATORY_CARE_PROVIDER_SITE_OTHER): Payer: Medicare Other | Admitting: Internal Medicine

## 2024-01-19 ENCOUNTER — Telehealth: Payer: Self-pay

## 2024-01-19 VITALS — BP 118/60 | HR 82 | Temp 97.8°F | Ht 66.0 in | Wt 145.0 lb

## 2024-01-19 DIAGNOSIS — R202 Paresthesia of skin: Secondary | ICD-10-CM | POA: Diagnosis not present

## 2024-01-19 DIAGNOSIS — I951 Orthostatic hypotension: Secondary | ICD-10-CM

## 2024-01-19 DIAGNOSIS — I10 Essential (primary) hypertension: Secondary | ICD-10-CM | POA: Diagnosis not present

## 2024-01-19 DIAGNOSIS — R7989 Other specified abnormal findings of blood chemistry: Secondary | ICD-10-CM | POA: Diagnosis not present

## 2024-01-19 DIAGNOSIS — C9110 Chronic lymphocytic leukemia of B-cell type not having achieved remission: Secondary | ICD-10-CM

## 2024-01-19 DIAGNOSIS — R29898 Other symptoms and signs involving the musculoskeletal system: Secondary | ICD-10-CM | POA: Diagnosis not present

## 2024-01-19 DIAGNOSIS — E559 Vitamin D deficiency, unspecified: Secondary | ICD-10-CM

## 2024-01-19 LAB — COMPREHENSIVE METABOLIC PANEL
ALT: 14 U/L (ref 0–53)
AST: 20 U/L (ref 0–37)
Albumin: 4.4 g/dL (ref 3.5–5.2)
Alkaline Phosphatase: 84 U/L (ref 39–117)
BUN: 20 mg/dL (ref 6–23)
CO2: 34 meq/L — ABNORMAL HIGH (ref 19–32)
Calcium: 9.1 mg/dL (ref 8.4–10.5)
Chloride: 100 meq/L (ref 96–112)
Creatinine, Ser: 0.94 mg/dL (ref 0.40–1.50)
GFR: 71.36 mL/min (ref >60.00)
Glucose, Bld: 108 mg/dL — ABNORMAL HIGH (ref 70–99)
Potassium: 4.6 meq/L (ref 3.5–5.1)
Sodium: 141 meq/L (ref 135–145)
Total Bilirubin: 1.2 mg/dL (ref 0.2–1.2)
Total Protein: 6.4 g/dL (ref 6.0–8.3)

## 2024-01-19 LAB — CBC WITH DIFFERENTIAL/PLATELET
Basophils Absolute: 0 10*3/uL (ref 0.0–0.1)
Basophils Relative: 0.1 % (ref 0.0–3.0)
Eosinophils Absolute: 0.1 10*3/uL (ref 0.0–0.7)
Eosinophils Relative: 0.3 % (ref 0.0–5.0)
HCT: 39.8 % (ref 39.0–52.0)
Hemoglobin: 12.9 g/dL — ABNORMAL LOW (ref 13.0–17.0)
Lymphocytes Relative: 89.9 % — ABNORMAL HIGH (ref 12.0–46.0)
Lymphs Abs: 39.6 10*3/uL — ABNORMAL HIGH (ref 0.7–4.0)
MCHC: 32.4 g/dL (ref 30.0–36.0)
MCV: 99.5 fL (ref 78.0–100.0)
Monocytes Absolute: 0.7 10*3/uL (ref 0.1–1.0)
Monocytes Relative: 1.6 % — ABNORMAL LOW (ref 3.0–12.0)
Neutro Abs: 3.6 10*3/uL (ref 1.4–7.7)
Neutrophils Relative %: 8.1 % — ABNORMAL LOW (ref 43.0–77.0)
Platelets: 164 10*3/uL (ref 150.0–400.0)
RBC: 4 Mil/uL — ABNORMAL LOW (ref 4.22–5.81)
RDW: 14.1 % (ref 11.5–15.5)
WBC: 44 10*3/uL (ref 4.0–10.5)

## 2024-01-19 LAB — TSH: TSH: 1.34 u[IU]/mL (ref 0.35–5.50)

## 2024-01-19 LAB — VITAMIN B12: Vitamin B-12: >1537 pg/mL — ABNORMAL HIGH (ref 211–911)

## 2024-01-19 LAB — T4, FREE: Free T4: 0.89 ng/dL (ref 0.60–1.60)

## 2024-01-19 NOTE — Assessment & Plan Note (Signed)
 BP Readings from Last 3 Encounters:  01/19/24 118/60  10/16/23 130/68  07/28/23 (!) 110/50  NAS diet

## 2024-01-19 NOTE — Assessment & Plan Note (Signed)
Monitor CBC/WBC 

## 2024-01-19 NOTE — Assessment & Plan Note (Signed)
 Resolved

## 2024-01-19 NOTE — Assessment & Plan Note (Signed)
  Increase protein intake Cut back on salted nuts

## 2024-01-19 NOTE — Telephone Encounter (Signed)
 CRITICAL VALUE STICKER  CRITICAL VALUE: critical wbc 44.0  RECEIVER (on-site recipient of call): Jerry Gomez  DATE & TIME NOTIFIED: 02/10 5:03  MESSENGER (representative from lab): karen windham  MD NOTIFIED: Dr.Aleksei [name]  TIME OF NOTIFICATION: 5:04  RESPONSE:  Informed that this is a normal level for patient due to their leukemia

## 2024-01-19 NOTE — Assessment & Plan Note (Signed)
 No change Increase protein intake Cut back on salted nuts

## 2024-01-19 NOTE — Progress Notes (Signed)
 Subjective:  Patient ID: Jerry Gomez, male    DOB: 1931-09-27  Age: 89 y.o. MRN: [id number]  CC: Medical Management of Chronic Issues (3 MNTH F/U)   HPI Jerry Gomez presents for weakness, urinary sx's leg swelling, CLL He is here w/wife Jerry Gomez and Jerry Gomez, his caregiver  Outpatient Medications Prior to Visit  Medication Sig Dispense Refill   Artificial Tear Ointment (ARTIFICIAL TEARS) ointment Place 1 drop into both eyes daily as needed (dry eyes).     aspirin  81 MG EC tablet Take 81 mg by mouth daily. Swallow whole.     Cholecalciferol  (VITAMIN D ) 2000 units tablet Take 2,000 Units by mouth daily.     Ensure (ENSURE) Take 237 mLs by mouth daily.     finasteride (PROSCAR) 5 MG tablet Take 5 mg by mouth daily.     furosemide  (LASIX ) 20 MG tablet TAKE 1 TABLET BY MOUTH EVERY DAY 90 tablet 1   GEMTESA  75 MG TABS TAKE 1 TABLET BY MOUTH EVERY DAY 90 tablet 2   Multiple Vitamins-Minerals (MULTIVITAMIN ADULT) CHEW Take one qod 100 tablet 3   Multiple Vitamins-Minerals (PRESERVISION AREDS PO) Take 1 capsule by mouth daily at 6 (six) AM.     vitamin B-12 (CYANOCOBALAMIN ) 500 MCG tablet Take 500 mcg by mouth daily.     B Complex-Folic Acid (B COMPLEX PLUS) TABS Take 1 tablet by mouth every other day. (Patient not taking: Reported on 08/01/2023) 100 tablet 3   mupirocin  ointment (BACTROBAN ) 2 % ON LEG WOUND W/DRESSING CHANGE ONCE OR TWICE DAILY (Patient not taking: Reported on 01/19/2024) 22 g 1   No facility-administered medications prior to visit.    ROS: Review of Systems  Constitutional:  Negative for appetite change, fatigue and unexpected weight change.  HENT:  Negative for congestion, nosebleeds, sneezing, sore throat and trouble swallowing.   Eyes:  Negative for itching and visual disturbance.  Respiratory:  Negative for cough.   Cardiovascular:  Negative for chest pain, palpitations and leg swelling.  Gastrointestinal:  Negative for abdominal distention, blood in stool, diarrhea  and nausea.  Genitourinary:  Negative for frequency and hematuria.  Musculoskeletal:  Positive for gait problem. Negative for back pain, joint swelling and neck pain.  Skin:  Negative for rash.  Neurological:  Negative for dizziness, tremors, speech difficulty and weakness.  Psychiatric/Behavioral:  Negative for agitation, dysphoric mood and sleep disturbance. The patient is not nervous/anxious.     Objective:  BP 118/60 (BP Location: Left Arm, Patient Position: Sitting, Cuff Size: Normal)   Pulse 82   Temp 97.8 F (36.6 C) (Oral)   Ht 5\' 6"  (1.676 m)   Wt 145 lb (65.8 kg)   SpO2 98%   BMI 23.40 kg/m   BP Readings from Last 3 Encounters:  01/19/24 118/60  10/16/23 130/68  07/28/23 (!) 110/50    Wt Readings from Last 3 Encounters:  01/19/24 145 lb (65.8 kg)  10/16/23 142 lb (64.4 kg)  09/02/23 131 lb (59.4 kg)    Physical Exam Constitutional:      General: He is not in acute distress.    Appearance: He is well-developed.     Comments: NAD  Eyes:     Conjunctiva/sclera: Conjunctivae normal.     Pupils: Pupils are equal, round, and reactive to light.  Neck:     Thyroid : No thyromegaly.     Vascular: No JVD.  Cardiovascular:     Rate and Rhythm: Normal rate and regular rhythm.  Heart sounds: Normal heart sounds. No murmur heard.    No friction rub. No gallop.  Pulmonary:     Effort: Pulmonary effort is normal. No respiratory distress.     Breath sounds: Normal breath sounds. No wheezing or rales.  Chest:     Chest wall: No tenderness.  Abdominal:     General: Bowel sounds are normal. There is no distension.     Palpations: Abdomen is soft. There is no mass.     Tenderness: There is no abdominal tenderness. There is no guarding or rebound.  Musculoskeletal:        General: No tenderness. Normal range of motion.     Cervical back: Normal range of motion.     Right lower leg: No edema.     Left lower leg: No edema.  Lymphadenopathy:     Cervical: No cervical  adenopathy.  Skin:    General: Skin is warm and dry.     Findings: No rash.  Neurological:     Mental Status: He is alert and oriented to person, place, and time.     Cranial Nerves: No cranial nerve deficit.     Motor: No abnormal muscle tone.     Coordination: Coordination normal.     Gait: Gait normal.     Deep Tendon Reflexes: Reflexes are normal and symmetric.  Psychiatric:        Behavior: Behavior normal.        Thought Content: Thought content normal.        Judgment: Judgment normal.   Weak legs Can walk w/a walker In a w/c Edema LE L>R, L 1+, R  trace edema  Lab Results  Component Value Date   WBC 40.6 (H) 07/28/2023   HGB 12.4 (L) 07/28/2023   HCT 38.7 (L) 07/28/2023   PLT 146 (L) 07/28/2023   GLUCOSE 110 (H) 07/28/2023   CHOL 119 08/14/2021   TRIG 57.0 08/14/2021   HDL 53.30 08/14/2021   LDLCALC 55 08/14/2021   ALT 10 07/28/2023   AST 16 07/28/2023   NA 140 07/28/2023   K 4.1 07/28/2023   CL 103 07/28/2023   CREATININE 0.70 07/28/2023   BUN 19 07/28/2023   CO2 32 07/28/2023   TSH 1.33 04/30/2022   PSA 8.51 (H) 08/14/2021    CT Cervical Spine Wo Contrast Result Date: 06/15/2023 CLINICAL DATA:  Status post multiple falls. EXAM: CT CERVICAL SPINE WITHOUT CONTRAST TECHNIQUE: Multidetector CT imaging of the cervical spine was performed without intravenous contrast. Multiplanar CT image reconstructions were also generated. RADIATION DOSE REDUCTION: This exam was performed according to the departmental dose-optimization program which includes automated exposure control, adjustment of the mA and/or kV according to patient size and/or use of iterative reconstruction technique. COMPARISON:  None Available. FINDINGS: Alignment: There is approximally 4 mm, chronic appearing retrolisthesis of the C5 vertebral body on C6. Skull base and vertebrae: No acute fracture. No primary bone lesion or focal pathologic process. Soft tissues and spinal canal: No prevertebral fluid or  swelling. No visible canal hematoma. Disc levels: Marked severity endplate sclerosis is seen at the levels of C3-C4 and C5-C6, with mild to moderate severity endplate sclerosis noted throughout the remainder of the cervical spine. Marked severity anterior osteophyte formation and posterior bony spurring are also seen at C5-C6. There is marked severity narrowing of the anterior atlantoaxial articulation. Marked severity intervertebral disc space narrowing is seen at C5-C6. Moderate severity intervertebral disc space narrowing is present at the levels of C3-C4,  C4-C5 and C6-C7. Bilateral marked severity multilevel facet joint hypertrophy is noted. Upper chest: Negative. Other: None. IMPRESSION: 1. Marked severity multilevel degenerative changes, most prominent at the levels of C3-C4 and C5-C6. 2. Chronic appearing retrolisthesis of the C5 vertebral body on C6. 3. No acute cervical spine fracture. Electronically Signed   By: Virgle Grime M.D.   On: 06/15/2023 01:08   CT Head Wo Contrast Result Date: 06/15/2023 CLINICAL DATA:  Multiple falls. EXAM: CT HEAD WITHOUT CONTRAST TECHNIQUE: Contiguous axial images were obtained from the base of the skull through the vertex without intravenous contrast. RADIATION DOSE REDUCTION: This exam was performed according to the departmental dose-optimization program which includes automated exposure control, adjustment of the mA and/or kV according to patient size and/or use of iterative reconstruction technique. COMPARISON:  October 24, 2022 FINDINGS: Brain: There is mild cerebral atrophy with widening of the extra-axial spaces and ventricular dilatation. There are areas of decreased attenuation within the white matter tracts of the supratentorial brain, consistent with microvascular disease changes. Vascular: No hyperdense vessel or unexpected calcification. Skull: Normal. Negative for fracture or focal lesion. Sinuses/Orbits: No acute finding. Other: None. IMPRESSION: 1.  Generalized cerebral atrophy with chronic white matter small vessel ischemic changes. 2. No acute intracranial abnormality. Electronically Signed   By: Virgle Grime M.D.   On: 06/15/2023 01:00   DG Pelvis 1-2 Views Result Date: 06/15/2023 CLINICAL DATA:  Status post multiple falls. EXAM: PELVIS - 1-2 VIEW COMPARISON:  January 10, 2022 FINDINGS: There is no evidence of an acute pelvic fracture or diastasis. Marked severity degenerative changes are seen involving both hips, in the form of joint space narrowing, subchondral cysts formation and acetabular sclerosis. Marked severe flattening of the bilateral femoral heads is also seen. IMPRESSION: Marked severity degenerative changes involving both hips, with additional findings consistent with sequelae related to avascular necrosis of the bilateral femoral heads. Electronically Signed   By: Virgle Grime M.D.   On: 06/15/2023 00:59   DG Shoulder Left Result Date: 06/15/2023 CLINICAL DATA:  Status post multiple falls. EXAM: LEFT SHOULDER - 2+ VIEW COMPARISON:  None Available. FINDINGS: There is no evidence of an acute fracture or dislocation. Moderate severity degenerative changes seen involving the left acromioclavicular joint and left glenohumeral articulation. Mild lateral soft tissue swelling is noted. IMPRESSION: Moderate severity degenerative changes without an acute fracture or dislocation. Electronically Signed   By: Virgle Grime M.D.   On: 06/15/2023 00:56   DG Elbow Complete Left Result Date: 06/15/2023 CLINICAL DATA:  Status post multiple falls. EXAM: LEFT ELBOW - COMPLETE 3+ VIEW COMPARISON:  None Available. FINDINGS: There is no evidence of an acute fracture, dislocation, or joint effusion. Chronic and degenerative changes are seen involving the mediolateral epicondyles of the distal left humerus. Soft tissues are unremarkable. IMPRESSION: Chronic and degenerative changes without an acute fracture or dislocation. Electronically Signed    By: Virgle Grime M.D.   On: 06/15/2023 00:55   DG Chest 2 View Result Date: 06/15/2023 CLINICAL DATA:  Multiple falls. EXAM: CHEST - 2 VIEW COMPARISON:  None Available. FINDINGS: The heart size and mediastinal contours are within normal limits. There is moderate severity calcification of the aortic arch. There is no evidence of an acute infiltrate, pleural effusion or pneumothorax. Multilevel degenerative changes seen throughout the thoracic spine. IMPRESSION: No active cardiopulmonary disease. Electronically Signed   By: Virgle Grime M.D.   On: 06/15/2023 00:54    Assessment & Plan:   Problem List Items Addressed This  Visit     Essential hypertension   BP Readings from Last 3 Encounters:  01/19/24 118/60  10/16/23 130/68  07/28/23 (!) 110/50  NAS diet       Relevant Orders   CBC with Differential/Platelet   Comprehensive metabolic panel   TSH   T4, free   CLL (chronic lymphocytic leukemia) (HCC) - Primary   Monitor CBC/WBC      Relevant Orders   CBC with Differential/Platelet   Comprehensive metabolic panel   TSH   T4, free   Vitamin B12   VITAMIN D  25 Hydroxy (Vit-D Deficiency, Fractures)   Leg weakness, bilateral   No change Increase protein intake Cut back on salted nuts      Relevant Orders   CBC with Differential/Platelet   Comprehensive metabolic panel   TSH   T4, free   Vitamin B12   VITAMIN D  25 Hydroxy (Vit-D Deficiency, Fractures)   Orthostatic hypotension   Resolved      Relevant Orders   T4, free   Paresthesia   Relevant Orders   Vitamin B12   Low serum total protein level    Increase protein intake Cut back on salted nuts      Other Visit Diagnoses       Vitamin D  deficiency       Relevant Orders   VITAMIN D  25 Hydroxy (Vit-D Deficiency, Fractures)         No orders of the defined types were placed in this encounter.     Follow-up: No follow-ups on file.  Anitra Barn, MD

## 2024-01-20 LAB — VITAMIN D 25 HYDROXY (VIT D DEFICIENCY, FRACTURES): VITD: 46.98 ng/mL (ref 30.00–100.00)

## 2024-01-20 NOTE — Telephone Encounter (Signed)
Noted: CLL Thx

## 2024-01-21 ENCOUNTER — Encounter: Payer: Self-pay | Admitting: Internal Medicine

## 2024-01-26 ENCOUNTER — Telehealth: Payer: Self-pay | Admitting: Physician Assistant

## 2024-01-26 NOTE — Telephone Encounter (Signed)
Rescheduled appointments per patient request based on upcoming weather.

## 2024-01-28 ENCOUNTER — Ambulatory Visit: Payer: Medicare Other | Admitting: Internal Medicine

## 2024-01-28 ENCOUNTER — Other Ambulatory Visit: Payer: Medicare Other

## 2024-01-29 ENCOUNTER — Ambulatory Visit: Payer: Medicare Other | Admitting: Internal Medicine

## 2024-01-29 ENCOUNTER — Other Ambulatory Visit: Payer: Medicare Other

## 2024-01-30 ENCOUNTER — Ambulatory Visit: Payer: Medicare Other | Admitting: Internal Medicine

## 2024-01-30 NOTE — Progress Notes (Signed)
 [facility] OFFICE PROGRESS NOTE  [name], Georgina Quint, MD [address] [place] [address]  DIAGNOSIS: Stage 0 Chronic lymphocytic leukemia   PRIOR THERAPY: None  CURRENT THERAPY: Observation  INTERVAL HISTORY: Jerry Gomez 89 y.o. male returns to the clinic today for a follow-up visit accompanied by his wife and caregiver.  The patient is being followed for CLL.  He is currently on observation and feeling well.  He is seen every 6 months for repeat blood work.  Today he denies any fever, chills, night sweats, or unexplained weight loss.  Denies any lymphadenopathy.  Denies any recent signs or symptoms of infection including nasal congestion, sore throat, cough, shortness of breath, skin infections, diarrhea, or dysuria.  Denies any abnormal bleeding or bruising.  Denies any early satiety or abdominal pain or bloating.  He is here today for evaluation and repeat blood work.   MEDICAL HISTORY: Past Medical History:  Diagnosis Date   Cancer (HCC)    CLL   Cataract    COLONIC POLYPS 03/02/2009   EYE SURGERY, HX OF 09/05/2007   HEMORRHOIDS, HX OF    HYPERLIPIDEMIA 09/05/2007   HYPERTENSION 07/25/2007   OSTEOARTHRITIS 09/05/2007   SLEEP APNEA, OBSTRUCTIVE 09/05/2007    ALLERGIES:  has no known allergies.  MEDICATIONS:  Current Outpatient Medications  Medication Sig Dispense Refill   Artificial Tear Ointment (ARTIFICIAL TEARS) ointment Place 1 drop into both eyes daily as needed (dry eyes).     aspirin 81 MG EC tablet Take 81 mg by mouth daily. Swallow whole.     B Complex-Folic Acid (B COMPLEX PLUS) TABS Take 1 tablet by mouth every other day. (Patient not taking: Reported on 08/01/2023) 100 tablet 3   Cholecalciferol (VITAMIN D) 2000 units tablet Take 2,000 Units by mouth daily.     Ensure (ENSURE) Take 237 mLs by mouth daily.     finasteride (PROSCAR) 5 MG tablet Take 5 mg by mouth daily.     furosemide (LASIX) 20 MG tablet TAKE 1 TABLET BY MOUTH EVERY DAY 90  tablet 1   GEMTESA 75 MG TABS TAKE 1 TABLET BY MOUTH EVERY DAY 90 tablet 2   Multiple Vitamins-Minerals (MULTIVITAMIN ADULT) CHEW Take one qod 100 tablet 3   Multiple Vitamins-Minerals (PRESERVISION AREDS PO) Take 1 capsule by mouth daily at 6 (six) AM.     mupirocin ointment (BACTROBAN) 2 % ON LEG WOUND W/DRESSING CHANGE ONCE OR TWICE DAILY (Patient not taking: Reported on 01/19/2024) 22 g 1   vitamin B-12 (CYANOCOBALAMIN) 500 MCG tablet Take 500 mcg by mouth daily.     No current facility-administered medications for this visit.    SURGICAL HISTORY:  Past Surgical History:  Procedure Laterality Date   EYE SURGERY  10/22/13   left eye, cataract    REVIEW OF SYSTEMS:   Review of Systems  Constitutional: Negative for appetite change, chills, fatigue, fever and unexpected weight change.  HENT: Negative for mouth sores, nosebleeds, sore throat and trouble swallowing.   Eyes: Negative for eye problems and icterus.  Respiratory: Negative for cough, hemoptysis, shortness of breath and wheezing.   Cardiovascular: Negative for chest pain and leg swelling.  Gastrointestinal: Negative for abdominal pain, constipation, diarrhea, nausea and vomiting.  Genitourinary: Negative for bladder incontinence, difficulty urinating, dysuria, frequency and hematuria.   Musculoskeletal: Negative for back pain, gait problem, neck pain and neck stiffness.  Skin: Negative for itching and rash.  Neurological: Negative for dizziness, extremity weakness, gait problem, headaches,  light-headedness and seizures.  Hematological: Negative for adenopathy. Does not bruise/bleed easily.  Psychiatric/Behavioral: Negative for confusion, depression and sleep disturbance. The patient is not nervous/anxious.     PHYSICAL EXAMINATION:  There were no vitals taken for this visit.  ECOG PERFORMANCE STATUS: 0  Physical Exam  Constitutional: Oriented to person, place, and time and appears younger than stated ate, and in no  distress.  HENT:  Head: Normocephalic and atraumatic.  Mouth/Throat: Oropharynx is clear and moist. No oropharyngeal exudate.  Eyes: Conjunctivae are normal. Right eye exhibits no discharge. Left eye exhibits no discharge. No scleral icterus.  Neck: Normal range of motion. Neck supple.  Cardiovascular: Normal rate, regular rhythm, normal heart sounds and intact distal pulses.   Pulmonary/Chest: Effort normal and breath sounds normal. No respiratory distress. No wheezes. No rales.  Abdominal: Soft. Bowel sounds are normal. Exhibits no distension and no mass. There is no tenderness.  Musculoskeletal: Normal range of motion. Exhibits no edema.  Lymphadenopathy:    No cervical adenopathy.  Neurological: Alert and oriented to person, place, and time. Exhibits muscle wasting. He was examined in the wheelchair.  Skin: Skin is warm and dry. No rash noted. Not diaphoretic. No erythema. No pallor.  Psychiatric: Mood, memory and judgment normal.  Vitals reviewed.  LABORATORY DATA: Lab Results  Component Value Date   WBC 44.0 Repeated and verified X2. (HH) 01/19/2024   HGB 12.9 (L) 01/19/2024   HCT 39.8 01/19/2024   MCV 99.5 01/19/2024   PLT 164.0 01/19/2024      Chemistry      Component Value Date/Time   NA 141 01/19/2024 1502   NA 141 06/17/2017 1259   K 4.6 01/19/2024 1502   K 4.5 06/17/2017 1259   CL 100 01/19/2024 1502   CL 104 12/14/2012 1031   CO2 34 (H) 01/19/2024 1502   CO2 30 (H) 06/17/2017 1259   BUN 20 01/19/2024 1502   BUN 16.4 06/17/2017 1259   CREATININE 0.94 01/19/2024 1502   CREATININE 0.70 07/28/2023 1448   CREATININE 1.1 06/17/2017 1259      Component Value Date/Time   CALCIUM 9.1 01/19/2024 1502   CALCIUM 9.8 06/17/2017 1259   ALKPHOS 84 01/19/2024 1502   ALKPHOS 75 06/17/2017 1259   AST 20 01/19/2024 1502   AST 16 07/28/2023 1448   AST 29 06/17/2017 1259   ALT 14 01/19/2024 1502   ALT 10 07/28/2023 1448   ALT 27 06/17/2017 1259   BILITOT 1.2 01/19/2024  1502   BILITOT 0.9 07/28/2023 1448   BILITOT 2.19 (H) 06/17/2017 1259       RADIOGRAPHIC STUDIES:  No results found.   ASSESSMENT/PLAN:  This is a very pleasant 89 year old Caucasian male with stage 0 chronic lymphocytic leukemia.  The patient has been on observation with no concerning issues.  Clinically does not have any B symptoms.  He had repeat blood work performed that shows total white blood cell count of 44.6. His Hbg is normal at 13.0. he has mild thrombocytopenia with a platelet count of 135k.    The patient was seen with Dr. Arbutus Ped.   Dr. Arbutus Ped recommends that he continue on observation with repeat blood work in 6 months.  Of course, should he have any concerning lab work performed at any of his other providers offices where always happy to see him sooner.   The patient was advised to call immediately if he has any concerning symptoms in the interval. The patient voices understanding of current disease status  and treatment options and is in agreement with the current care plan. All questions were answered. The patient knows to call the clinic with any problems, questions or concerns. We can certainly see the patient much sooner if necessary     No orders of the defined types were placed in this encounter.   Anaiah Mcmannis L Tailyn Hantz, PA-C 01/30/24  ADDENDUM: Hematology/Oncology Attending: I had a face-to-face encounter with the patient today.  I reviewed his record, lab and recommended his care plan.  This is a very pleasant 89 years old white male with history of stage 0 chronic lymphocytic leukemia diagnosed several years ago and has been on observation since that time.  The patient is feeling fine today with no concerning complaints or B symptoms.  He came to the clinic accompanied by his wife and a caregiver.  He denied having any significant complaints except for arthralgia in his knees and hip.  He had repeat CBC that showed elevated but stable white blood  count of 44,000 with normal hemoglobin, hematocrit and slightly low platelets count. I recommended for the patient to continue on observation with repeat CBC, comprehensive metabolic panel and LDH in 6 months. He was advised to call immediately if he has any other concerning symptoms in the interval. The total time spent in the appointment was 20 minutes. Disclaimer: This note was dictated with voice recognition software. Similar sounding words can inadvertently be transcribed and may be missed upon review. Lajuana Matte, MD

## 2024-02-01 ENCOUNTER — Other Ambulatory Visit: Payer: Self-pay | Admitting: Internal Medicine

## 2024-02-04 ENCOUNTER — Inpatient Hospital Stay (HOSPITAL_BASED_OUTPATIENT_CLINIC_OR_DEPARTMENT_OTHER): Payer: Medicare Other | Admitting: Physician Assistant

## 2024-02-04 ENCOUNTER — Inpatient Hospital Stay: Payer: Medicare Other | Attending: Physician Assistant

## 2024-02-04 VITALS — BP 132/73 | HR 83 | Temp 97.6°F | Resp 16 | Wt 148.3 lb

## 2024-02-04 DIAGNOSIS — Z79899 Other long term (current) drug therapy: Secondary | ICD-10-CM | POA: Diagnosis not present

## 2024-02-04 DIAGNOSIS — Z7982 Long term (current) use of aspirin: Secondary | ICD-10-CM | POA: Diagnosis not present

## 2024-02-04 DIAGNOSIS — D696 Thrombocytopenia, unspecified: Secondary | ICD-10-CM | POA: Diagnosis not present

## 2024-02-04 DIAGNOSIS — C9110 Chronic lymphocytic leukemia of B-cell type not having achieved remission: Secondary | ICD-10-CM | POA: Insufficient documentation

## 2024-02-04 LAB — CMP (CANCER CENTER ONLY)
ALT: 12 U/L (ref 0–44)
AST: 20 U/L (ref 15–41)
Albumin: 4.5 g/dL (ref 3.5–5.0)
Alkaline Phosphatase: 96 U/L (ref 38–126)
Anion gap: 5 (ref 5–15)
BUN: 21 mg/dL (ref 8–23)
CO2: 33 mmol/L — ABNORMAL HIGH (ref 22–32)
Calcium: 9.4 mg/dL (ref 8.9–10.3)
Chloride: 102 mmol/L (ref 98–111)
Creatinine: 0.81 mg/dL (ref 0.61–1.24)
GFR, Estimated: >60 mL/min (ref >60)
Glucose, Bld: 112 mg/dL — ABNORMAL HIGH (ref 70–99)
Potassium: 4.6 mmol/L (ref 3.5–5.1)
Sodium: 140 mmol/L (ref 135–145)
Total Bilirubin: 1 mg/dL (ref 0.0–1.2)
Total Protein: 6.6 g/dL (ref 6.5–8.1)

## 2024-02-04 LAB — CBC WITH DIFFERENTIAL (CANCER CENTER ONLY)
Abs Immature Granulocytes: 0.05 10*3/uL (ref 0.00–0.07)
Basophils Absolute: 0.1 10*3/uL (ref 0.0–0.1)
Basophils Relative: 0 %
Eosinophils Absolute: 0.1 10*3/uL (ref 0.0–0.5)
Eosinophils Relative: 0 %
HCT: 40.4 % (ref 39.0–52.0)
Hemoglobin: 13 g/dL (ref 13.0–17.0)
Immature Granulocytes: 0 %
Lymphocytes Relative: 91 %
Lymphs Abs: 40.4 10*3/uL — ABNORMAL HIGH (ref 0.7–4.0)
MCH: 32.4 pg (ref 26.0–34.0)
MCHC: 32.2 g/dL (ref 30.0–36.0)
MCV: 100.7 fL — ABNORMAL HIGH (ref 80.0–100.0)
Monocytes Absolute: 0.8 10*3/uL (ref 0.1–1.0)
Monocytes Relative: 2 %
Neutro Abs: 3.2 10*3/uL (ref 1.7–7.7)
Neutrophils Relative %: 7 %
Platelet Count: 135 10*3/uL — ABNORMAL LOW (ref 150–400)
RBC: 4.01 MIL/uL — ABNORMAL LOW (ref 4.22–5.81)
RDW: 13.5 % (ref 11.5–15.5)
Smear Review: NORMAL
WBC Count: 44.6 10*3/uL — ABNORMAL HIGH (ref 4.0–10.5)
nRBC: 0 % (ref 0.0–0.2)

## 2024-02-04 LAB — LACTATE DEHYDROGENASE: LDH: 179 U/L (ref 98–192)

## 2024-02-15 ENCOUNTER — Other Ambulatory Visit: Payer: Self-pay | Admitting: Internal Medicine

## 2024-02-18 DIAGNOSIS — H353132 Nonexudative age-related macular degeneration, bilateral, intermediate dry stage: Secondary | ICD-10-CM | POA: Diagnosis not present

## 2024-02-18 DIAGNOSIS — H18421 Band keratopathy, right eye: Secondary | ICD-10-CM | POA: Diagnosis not present

## 2024-02-18 DIAGNOSIS — H524 Presbyopia: Secondary | ICD-10-CM | POA: Diagnosis not present

## 2024-02-18 DIAGNOSIS — H5211 Myopia, right eye: Secondary | ICD-10-CM | POA: Diagnosis not present

## 2024-02-18 DIAGNOSIS — Z961 Presence of intraocular lens: Secondary | ICD-10-CM | POA: Diagnosis not present

## 2024-06-30 DIAGNOSIS — R351 Nocturia: Secondary | ICD-10-CM | POA: Diagnosis not present

## 2024-06-30 DIAGNOSIS — N401 Enlarged prostate with lower urinary tract symptoms: Secondary | ICD-10-CM | POA: Diagnosis not present

## 2024-06-30 DIAGNOSIS — R3915 Urgency of urination: Secondary | ICD-10-CM | POA: Diagnosis not present

## 2024-07-14 ENCOUNTER — Encounter: Payer: Self-pay | Admitting: Internal Medicine

## 2024-07-19 ENCOUNTER — Encounter: Payer: Self-pay | Admitting: Internal Medicine

## 2024-07-19 ENCOUNTER — Telehealth: Payer: Self-pay

## 2024-07-19 ENCOUNTER — Ambulatory Visit: Payer: Medicare Other | Admitting: Internal Medicine

## 2024-07-19 VITALS — BP 105/61 | HR 88 | Temp 97.6°F | Ht 66.0 in | Wt 152.0 lb

## 2024-07-19 DIAGNOSIS — E785 Hyperlipidemia, unspecified: Secondary | ICD-10-CM

## 2024-07-19 DIAGNOSIS — R202 Paresthesia of skin: Secondary | ICD-10-CM

## 2024-07-19 DIAGNOSIS — R29898 Other symptoms and signs involving the musculoskeletal system: Secondary | ICD-10-CM

## 2024-07-19 DIAGNOSIS — R413 Other amnesia: Secondary | ICD-10-CM | POA: Diagnosis not present

## 2024-07-19 DIAGNOSIS — G47419 Narcolepsy without cataplexy: Secondary | ICD-10-CM

## 2024-07-19 DIAGNOSIS — I10 Essential (primary) hypertension: Secondary | ICD-10-CM

## 2024-07-19 LAB — CBC WITH DIFFERENTIAL/PLATELET
Basophils Absolute: 0.1 K/uL (ref 0.0–0.1)
Basophils Relative: 0.2 % (ref 0.0–3.0)
Eosinophils Absolute: 0.1 K/uL (ref 0.0–0.7)
Eosinophils Relative: 0.2 % (ref 0.0–5.0)
HCT: 39.3 % (ref 39.0–52.0)
Hemoglobin: 12.8 g/dL — ABNORMAL LOW (ref 13.0–17.0)
Lymphocytes Relative: 88.4 % — ABNORMAL HIGH (ref 12.0–46.0)
Lymphs Abs: 36.6 K/uL — ABNORMAL HIGH (ref 0.7–4.0)
MCHC: 32.7 g/dL (ref 30.0–36.0)
MCV: 97.2 fl (ref 78.0–100.0)
Monocytes Absolute: 0.7 K/uL (ref 0.1–1.0)
Monocytes Relative: 1.7 % — ABNORMAL LOW (ref 3.0–12.0)
Neutro Abs: 3.9 K/uL (ref 1.4–7.7)
Neutrophils Relative %: 9.5 % — ABNORMAL LOW (ref 43.0–77.0)
Platelets: 166 K/uL (ref 150.0–400.0)
RBC: 4.04 Mil/uL — ABNORMAL LOW (ref 4.22–5.81)
RDW: 13.9 % (ref 11.5–15.5)
WBC: 41.3 K/uL (ref 4.0–10.5)

## 2024-07-19 LAB — URINALYSIS
Bilirubin Urine: NEGATIVE
Hgb urine dipstick: NEGATIVE
Ketones, ur: NEGATIVE
Leukocytes,Ua: NEGATIVE
Nitrite: NEGATIVE
Specific Gravity, Urine: 1.02 (ref 1.000–1.030)
Total Protein, Urine: NEGATIVE
Urine Glucose: NEGATIVE
Urobilinogen, UA: 0.2 (ref 0.0–1.0)
pH: 6 (ref 5.0–8.0)

## 2024-07-19 LAB — COMPREHENSIVE METABOLIC PANEL WITH GFR
ALT: 15 U/L (ref 0–53)
AST: 19 U/L (ref 0–37)
Albumin: 4.5 g/dL (ref 3.5–5.2)
Alkaline Phosphatase: 98 U/L (ref 39–117)
BUN: 21 mg/dL (ref 6–23)
CO2: 31 meq/L (ref 19–32)
Calcium: 9.2 mg/dL (ref 8.4–10.5)
Chloride: 102 meq/L (ref 96–112)
Creatinine, Ser: 0.68 mg/dL (ref 0.40–1.50)
GFR: 81.54 mL/min (ref >60.00)
Glucose, Bld: 100 mg/dL — ABNORMAL HIGH (ref 70–99)
Potassium: 4.2 meq/L (ref 3.5–5.1)
Sodium: 141 meq/L (ref 135–145)
Total Bilirubin: 0.9 mg/dL (ref 0.2–1.2)
Total Protein: 6.5 g/dL (ref 6.0–8.3)

## 2024-07-19 LAB — VITAMIN B12: Vitamin B-12: 669 pg/mL (ref 211–911)

## 2024-07-19 LAB — TSH: TSH: 1.56 u[IU]/mL (ref 0.35–5.50)

## 2024-07-19 LAB — AMMONIA: Ammonia: 49 umol/L — ABNORMAL HIGH (ref 11–35)

## 2024-07-19 LAB — T4, FREE: Free T4: 0.92 ng/dL (ref 0.60–1.60)

## 2024-07-19 MED ORDER — METHYLPHENIDATE HCL 5 MG PO TABS
ORAL_TABLET | ORAL | 0 refills | Status: DC
Start: 2024-07-19 — End: 2024-09-20

## 2024-07-19 NOTE — Assessment & Plan Note (Signed)
 Mild Ritalin  should help Reduce or stop alcohol  [facility] is not interested in stopping Margaritas. Will use Ritalin  w/caution Potential benefits of a long term amphetamines  use as well as potential risks  and complications were explained to the patient and were aknowledged.

## 2024-07-19 NOTE — Assessment & Plan Note (Addendum)
  New:  falling asleep a lot (while eating, riding a scooter) every hour  x 2 minutes long over past 2 months. Pt has OSA - no using CPAP Per Ronal and Danielle (aid) Worse w/dinner. He drinks 2 Margaritas on ice at dinner Stop Margaritas at dinner to see if better. If not - consider Ritalin  at low dose.  [place] is not interested in stopping Margaritas. Will use Ritalin  w/caution  Potential benefits of a long term amphetamines  use as well as potential risks  and complications were explained to the patient and were aknowledged.

## 2024-07-19 NOTE — Assessment & Plan Note (Signed)
 No change Increase protein intake Cut back on salted nuts

## 2024-07-19 NOTE — Telephone Encounter (Signed)
 CRITICAL VALUE STICKER  CRITICAL VALUE: 41.3 WBC  RECEIVER (on-site recipient of call):Kim H.   DATE & TIME NOTIFIED: 1644   MESSENGER (representative from lab): Darice  MD NOTIFIED: [name]

## 2024-07-19 NOTE — Progress Notes (Signed)
 Subjective:  Patient ID: Jerry Gomez, male    DOB: 13-May-1931  Age: 89 y.o. MRN: [id number]  CC: Medical Management of Chronic Issues (6 mnth f/u )   HPI Jerry Gomez presents for OA, leg edema, falling asleep a lot (while eating, riding a scooter) every hour  x 2 minutes long over past 2 months. He is here w/Mary and Edsel (aid). Worse w/dinner He drinks 2 Margaritas on ice at dinner  Outpatient Medications Prior to Visit  Medication Sig Dispense Refill   Artificial Tear Ointment (ARTIFICIAL TEARS) ointment Place 1 drop into both eyes daily as needed (dry eyes).     aspirin  81 MG EC tablet Take 81 mg by mouth daily. Swallow whole.     Cholecalciferol  (VITAMIN D ) 2000 units tablet Take 2,000 Units by mouth daily.     Ensure (ENSURE) Take 237 mLs by mouth daily.     finasteride (PROSCAR) 5 MG tablet Take 5 mg by mouth daily.     furosemide  (LASIX ) 20 MG tablet TAKE 1 TABLET BY MOUTH EVERY DAY 90 tablet 1   GEMTESA  75 MG TABS TAKE 1 TABLET BY MOUTH EVERY DAY 90 tablet 2   Multiple Vitamins-Minerals (MULTIVITAMIN ADULT) CHEW Take one qod 100 tablet 3   Multiple Vitamins-Minerals (PRESERVISION AREDS PO) Take 1 capsule by mouth daily at 6 (six) AM.     B Complex-Folic Acid (B COMPLEX PLUS) TABS Take 1 tablet by mouth every other day. (Patient not taking: Reported on 07/19/2024) 100 tablet 3   mupirocin  ointment (BACTROBAN ) 2 % ON LEG WOUND W/DRESSING CHANGE ONCE OR TWICE DAILY (Patient not taking: Reported on 07/19/2024) 22 g 1   No facility-administered medications prior to visit.    ROS: Review of Systems  Constitutional:  Negative for appetite change, fatigue and unexpected weight change.  HENT:  Negative for congestion, nosebleeds, sneezing, sore throat and trouble swallowing.   Eyes:  Negative for itching and visual disturbance.  Respiratory:  Negative for cough.   Cardiovascular:  Negative for chest pain, palpitations and leg swelling.  Gastrointestinal:  Negative for  abdominal distention, blood in stool, diarrhea and nausea.  Genitourinary:  Negative for frequency and hematuria.  Musculoskeletal:  Positive for back pain and gait problem. Negative for joint swelling and neck pain.  Skin:  Negative for rash.  Neurological:  Negative for dizziness, tremors, speech difficulty and weakness.  Psychiatric/Behavioral:  Positive for decreased concentration and sleep disturbance. Negative for agitation, confusion, dysphoric mood and suicidal ideas. The patient is nervous/anxious.     Objective:  BP 105/61   Pulse 88   Temp 97.6 F (36.4 C) (Oral)   Ht 5' 6 (1.676 m)   Wt 152 lb (68.9 kg)   SpO2 96%   BMI 24.53 kg/m   BP Readings from Last 3 Encounters:  07/19/24 105/61  02/04/24 132/73  01/19/24 118/60    Wt Readings from Last 3 Encounters:  07/19/24 152 lb (68.9 kg)  02/04/24 148 lb 4.8 oz (67.3 kg)  01/19/24 145 lb (65.8 kg)    Physical Exam Constitutional:      General: He is not in acute distress.    Appearance: He is well-developed.     Comments: NAD  Eyes:     Conjunctiva/sclera: Conjunctivae normal.     Pupils: Pupils are equal, round, and reactive to light.  Neck:     Thyroid : No thyromegaly.     Vascular: No JVD.  Cardiovascular:     Rate and  Rhythm: Normal rate and regular rhythm.     Heart sounds: Normal heart sounds. No murmur heard.    No friction rub. No gallop.  Pulmonary:     Effort: Pulmonary effort is normal. No respiratory distress.     Breath sounds: Normal breath sounds. No wheezing or rales.  Chest:     Chest wall: No tenderness.  Abdominal:     General: Bowel sounds are normal. There is no distension.     Palpations: Abdomen is soft. There is no mass.     Tenderness: There is no abdominal tenderness. There is no guarding or rebound.  Musculoskeletal:        General: No tenderness. Normal range of motion.     Cervical back: Normal range of motion.     Right lower leg: No edema.     Left lower leg: No edema.   Lymphadenopathy:     Cervical: No cervical adenopathy.  Skin:    General: Skin is warm and dry.     Findings: No rash.  Neurological:     Mental Status: He is alert and oriented to person, place, and time. Mental status is at baseline.     Cranial Nerves: No cranial nerve deficit.     Motor: Weakness present. No abnormal muscle tone.     Coordination: Coordination abnormal.     Gait: Gait abnormal.     Deep Tendon Reflexes: Reflexes are normal and symmetric.  Psychiatric:        Behavior: Behavior normal.        Thought Content: Thought content normal.        Judgment: Judgment normal.   B legs w/edema  Lab Results  Component Value Date   WBC 44.6 (H) 02/04/2024   HGB 13.0 02/04/2024   HCT 40.4 02/04/2024   PLT 135 (L) 02/04/2024   GLUCOSE 112 (H) 02/04/2024   CHOL 119 08/14/2021   TRIG 57.0 08/14/2021   HDL 53.30 08/14/2021   LDLCALC 55 08/14/2021   ALT 12 02/04/2024   AST 20 02/04/2024   NA 140 02/04/2024   K 4.6 02/04/2024   CL 102 02/04/2024   CREATININE 0.81 02/04/2024   BUN 21 02/04/2024   CO2 33 (H) 02/04/2024   TSH 1.34 01/19/2024   PSA 8.51 (H) 08/14/2021    CT Cervical Spine Wo Contrast Result Date: 06/15/2023 CLINICAL DATA:  Status post multiple falls. EXAM: CT CERVICAL SPINE WITHOUT CONTRAST TECHNIQUE: Multidetector CT imaging of the cervical spine was performed without intravenous contrast. Multiplanar CT image reconstructions were also generated. RADIATION DOSE REDUCTION: This exam was performed according to the departmental dose-optimization program which includes automated exposure control, adjustment of the mA and/or kV according to patient size and/or use of iterative reconstruction technique. COMPARISON:  None Available. FINDINGS: Alignment: There is approximally 4 mm, chronic appearing retrolisthesis of the C5 vertebral body on C6. Skull base and vertebrae: No acute fracture. No primary bone lesion or focal pathologic process. Soft tissues and spinal  canal: No prevertebral fluid or swelling. No visible canal hematoma. Disc levels: Marked severity endplate sclerosis is seen at the levels of C3-C4 and C5-C6, with mild to moderate severity endplate sclerosis noted throughout the remainder of the cervical spine. Marked severity anterior osteophyte formation and posterior bony spurring are also seen at C5-C6. There is marked severity narrowing of the anterior atlantoaxial articulation. Marked severity intervertebral disc space narrowing is seen at C5-C6. Moderate severity intervertebral disc space narrowing is present at the levels of  C3-C4, C4-C5 and C6-C7. Bilateral marked severity multilevel facet joint hypertrophy is noted. Upper chest: Negative. Other: None. IMPRESSION: 1. Marked severity multilevel degenerative changes, most prominent at the levels of C3-C4 and C5-C6. 2. Chronic appearing retrolisthesis of the C5 vertebral body on C6. 3. No acute cervical spine fracture. Electronically Signed   By: Suzen Dials M.D.   On: 06/15/2023 01:08   CT Head Wo Contrast Result Date: 06/15/2023 CLINICAL DATA:  Multiple falls. EXAM: CT HEAD WITHOUT CONTRAST TECHNIQUE: Contiguous axial images were obtained from the base of the skull through the vertex without intravenous contrast. RADIATION DOSE REDUCTION: This exam was performed according to the departmental dose-optimization program which includes automated exposure control, adjustment of the mA and/or kV according to patient size and/or use of iterative reconstruction technique. COMPARISON:  October 24, 2022 FINDINGS: Brain: There is mild cerebral atrophy with widening of the extra-axial spaces and ventricular dilatation. There are areas of decreased attenuation within the white matter tracts of the supratentorial brain, consistent with microvascular disease changes. Vascular: No hyperdense vessel or unexpected calcification. Skull: Normal. Negative for fracture or focal lesion. Sinuses/Orbits: No acute finding.  Other: None. IMPRESSION: 1. Generalized cerebral atrophy with chronic white matter small vessel ischemic changes. 2. No acute intracranial abnormality. Electronically Signed   By: Suzen Dials M.D.   On: 06/15/2023 01:00   DG Pelvis 1-2 Views Result Date: 06/15/2023 CLINICAL DATA:  Status post multiple falls. EXAM: PELVIS - 1-2 VIEW COMPARISON:  January 10, 2022 FINDINGS: There is no evidence of an acute pelvic fracture or diastasis. Marked severity degenerative changes are seen involving both hips, in the form of joint space narrowing, subchondral cysts formation and acetabular sclerosis. Marked severe flattening of the bilateral femoral heads is also seen. IMPRESSION: Marked severity degenerative changes involving both hips, with additional findings consistent with sequelae related to avascular necrosis of the bilateral femoral heads. Electronically Signed   By: Suzen Dials M.D.   On: 06/15/2023 00:59   DG Shoulder Left Result Date: 06/15/2023 CLINICAL DATA:  Status post multiple falls. EXAM: LEFT SHOULDER - 2+ VIEW COMPARISON:  None Available. FINDINGS: There is no evidence of an acute fracture or dislocation. Moderate severity degenerative changes seen involving the left acromioclavicular joint and left glenohumeral articulation. Mild lateral soft tissue swelling is noted. IMPRESSION: Moderate severity degenerative changes without an acute fracture or dislocation. Electronically Signed   By: Suzen Dials M.D.   On: 06/15/2023 00:56   DG Elbow Complete Left Result Date: 06/15/2023 CLINICAL DATA:  Status post multiple falls. EXAM: LEFT ELBOW - COMPLETE 3+ VIEW COMPARISON:  None Available. FINDINGS: There is no evidence of an acute fracture, dislocation, or joint effusion. Chronic and degenerative changes are seen involving the mediolateral epicondyles of the distal left humerus. Soft tissues are unremarkable. IMPRESSION: Chronic and degenerative changes without an acute fracture or  dislocation. Electronically Signed   By: Suzen Dials M.D.   On: 06/15/2023 00:55   DG Chest 2 View Result Date: 06/15/2023 CLINICAL DATA:  Multiple falls. EXAM: CHEST - 2 VIEW COMPARISON:  None Available. FINDINGS: The heart size and mediastinal contours are within normal limits. There is moderate severity calcification of the aortic arch. There is no evidence of an acute infiltrate, pleural effusion or pneumothorax. Multilevel degenerative changes seen throughout the thoracic spine. IMPRESSION: No active cardiopulmonary disease. Electronically Signed   By: Suzen Dials M.D.   On: 06/15/2023 00:54    Assessment & Plan:   Problem List Items Addressed  This Visit     Dyslipidemia   Relevant Orders   Comprehensive metabolic panel with GFR   CBC with Differential/Platelet   Vitamin B12   T4, free   TSH   Ammonia   Essential hypertension   Leg weakness, bilateral   No change Increase protein intake Cut back on salted nuts      Relevant Orders   Comprehensive metabolic panel with GFR   CBC with Differential/Platelet   Vitamin B12   T4, free   TSH   Ammonia   Urinalysis   Memory problem   Mild Ritalin  should help Reduce or stop alcohol  Tirrell is not interested in stopping Margaritas. Will use Ritalin  w/caution Potential benefits of a long term amphetamines  use as well as potential risks  and complications were explained to the patient and were aknowledged.      Relevant Orders   Comprehensive metabolic panel with GFR   CBC with Differential/Platelet   Vitamin B12   T4, free   TSH   Ammonia   Narcolepsy - Primary    New:  falling asleep a lot (while eating, riding a scooter) every hour  x 2 minutes long over past 2 months. Pt has OSA - no using CPAP Per Ronal and Danielle (aid) Worse w/dinner. He drinks 2 Margaritas on ice at dinner Stop Margaritas at dinner to see if better. If not - consider Ritalin  at low dose.  Arlon is not interested in stopping  Margaritas. Will use Ritalin  w/caution  Potential benefits of a long term amphetamines  use as well as potential risks  and complications were explained to the patient and were aknowledged.         Paresthesia   Relevant Orders   Comprehensive metabolic panel with GFR   CBC with Differential/Platelet   Vitamin B12   T4, free   TSH   Ammonia   Urinalysis      Meds ordered this encounter  Medications   methylphenidate  (RITALIN ) 5 MG tablet    Sig: Take 2.5 or 5 mg at breakfast and lunch    Dispense:  60 tablet    Refill:  0      Follow-up: Return in about 3 months (around 10/19/2024) for a follow-up visit.  Marolyn Noel, MD

## 2024-07-20 ENCOUNTER — Ambulatory Visit: Payer: Self-pay | Admitting: Internal Medicine

## 2024-07-20 DIAGNOSIS — D485 Neoplasm of uncertain behavior of skin: Secondary | ICD-10-CM | POA: Diagnosis not present

## 2024-07-20 DIAGNOSIS — D1801 Hemangioma of skin and subcutaneous tissue: Secondary | ICD-10-CM | POA: Diagnosis not present

## 2024-07-20 DIAGNOSIS — L821 Other seborrheic keratosis: Secondary | ICD-10-CM | POA: Diagnosis not present

## 2024-07-20 DIAGNOSIS — L720 Epidermal cyst: Secondary | ICD-10-CM | POA: Diagnosis not present

## 2024-07-20 DIAGNOSIS — Z85828 Personal history of other malignant neoplasm of skin: Secondary | ICD-10-CM | POA: Diagnosis not present

## 2024-07-20 DIAGNOSIS — L570 Actinic keratosis: Secondary | ICD-10-CM | POA: Diagnosis not present

## 2024-07-20 DIAGNOSIS — C44622 Squamous cell carcinoma of skin of right upper limb, including shoulder: Secondary | ICD-10-CM | POA: Diagnosis not present

## 2024-07-30 NOTE — Progress Notes (Signed)
 [facility] OFFICE PROGRESS NOTE  [name], Karlynn GAILS, MD [address] [place] [address]  DIAGNOSIS: Stage 0 Chronic lymphocytic leukemia   PRIOR THERAPY: None  CURRENT THERAPY: Observation   INTERVAL HISTORY: Jerry Gomez 89 y.o. male returns to the clinic today for a follow-up visit accompanied by his wife and caregiver.  The patient is being followed for CLL.  He is currently on observation and feeling well.  He is seen every 6 months for repeat blood work.  Today he denies any fever, chills, night sweats, or unexplained weight loss.  Denies any lymphadenopathy.  Denies any recent signs or symptoms of infection including nasal congestion, sore throat, cough, shortness of breath, skin infections, diarrhea, or dysuria.  Denies any abnormal bleeding. He does have frequent bruising and skin tears. Denies any early satiety or abdominal pain or bloating.  He is here today for evaluation and repeat blood work.    MEDICAL HISTORY: Past Medical History:  Diagnosis Date   Cancer (HCC)    CLL   Cataract    COLONIC POLYPS 03/02/2009   EYE SURGERY, HX OF 09/05/2007   HEMORRHOIDS, HX OF    HYPERLIPIDEMIA 09/05/2007   HYPERTENSION 07/25/2007   OSTEOARTHRITIS 09/05/2007   SLEEP APNEA, OBSTRUCTIVE 09/05/2007    ALLERGIES:  has no known allergies.  MEDICATIONS:  Current Outpatient Medications  Medication Sig Dispense Refill   Artificial Tear Ointment (ARTIFICIAL TEARS) ointment Place 1 drop into both eyes daily as needed (dry eyes).     aspirin  81 MG EC tablet Take 81 mg by mouth daily. Swallow whole.     B Complex-Folic Acid (B COMPLEX PLUS) TABS Take 1 tablet by mouth every other day. (Patient not taking: Reported on 07/19/2024) 100 tablet 3   Cholecalciferol  (VITAMIN D ) 2000 units tablet Take 2,000 Units by mouth daily.     Ensure (ENSURE) Take 237 mLs by mouth daily.     finasteride (PROSCAR) 5 MG tablet Take 5 mg by mouth daily.     furosemide  (LASIX ) 20 MG tablet  TAKE 1 TABLET BY MOUTH EVERY DAY 90 tablet 1   GEMTESA  75 MG TABS TAKE 1 TABLET BY MOUTH EVERY DAY 90 tablet 2   methylphenidate  (RITALIN ) 5 MG tablet Take 2.5 or 5 mg at breakfast and lunch 60 tablet 0   Multiple Vitamins-Minerals (MULTIVITAMIN ADULT) CHEW Take one qod 100 tablet 3   Multiple Vitamins-Minerals (PRESERVISION AREDS PO) Take 1 capsule by mouth daily at 6 (six) AM.     mupirocin  ointment (BACTROBAN ) 2 % ON LEG WOUND W/DRESSING CHANGE ONCE OR TWICE DAILY (Patient not taking: Reported on 07/19/2024) 22 g 1   No current facility-administered medications for this visit.    SURGICAL HISTORY:  Past Surgical History:  Procedure Laterality Date   EYE SURGERY  10/22/13   left eye, cataract    REVIEW OF SYSTEMS:   Review of Systems  Constitutional: Negative for appetite change, chills, fatigue, fever and unexpected weight change.  HENT: Negative for mouth sores, nosebleeds, sore throat and trouble swallowing.   Eyes: Negative for eye problems and icterus.  Respiratory: Negative for cough, hemoptysis, shortness of breath and wheezing.   Cardiovascular: Negative for chest pain. Positive for bilateral leg swelling.  Gastrointestinal: Negative for abdominal pain, constipation, diarrhea, nausea and vomiting.  Genitourinary: Negative for bladder incontinence, difficulty urinating, dysuria, frequency and hematuria.   Musculoskeletal: Negative for back pain, gait problem, neck pain and neck stiffness.  Skin: Negative for itching  and rash.  Neurological: Negative for dizziness, extremity weakness, gait problem, headaches, light-headedness and seizures.  Hematological: Negative for adenopathy. Does not bleed easily. Positive for senile purpura.  Psychiatric/Behavioral: Negative for confusion, depression and sleep disturbance. The patient is not nervous/anxious.     PHYSICAL EXAMINATION:  There were no vitals taken for this visit.  ECOG PERFORMANCE STATUS: 2  Physical Exam   Constitutional: Oriented to person, place, and time and well-developed, well-nourished, and in no distress.   HENT:  Head: Normocephalic and atraumatic.  Mouth/Throat: Oropharynx is clear and moist. No oropharyngeal exudate.  Eyes: Conjunctivae are normal. Right eye exhibits no discharge. Left eye exhibits no discharge. No scleral icterus.  Neck: Normal range of motion. Neck supple.  Cardiovascular: Normal rate, regular rhythm, normal heart sounds and intact distal pulses.   Pulmonary/Chest: Effort normal and breath sounds normal. No respiratory distress. No wheezes. No rales.  Abdominal: Soft. Bowel sounds are normal. Exhibits no distension and no mass. There is no tenderness.  Musculoskeletal: Normal range of motion. Bilateral lower extremity swelling. Wearing compression stockings Lymphadenopathy:    No cervical adenopathy.  Neurological: Alert and oriented to person, place, and time. Exhibits muscle wasting. Examined in the wheelchair.  Skin: Skin is warm and dry. Positive for senile purpura. No rash noted. Not diaphoretic. No erythema. No pallor.  Psychiatric: Mood, memory and judgment normal.  Vitals reviewed.  LABORATORY DATA: Lab Results  Component Value Date   WBC 41.3 Repeated and verified X2. (HH) 07/19/2024   HGB 12.8 (L) 07/19/2024   HCT 39.3 07/19/2024   MCV 97.2 07/19/2024   PLT 166.0 07/19/2024      Chemistry      Component Value Date/Time   NA 141 07/19/2024 1502   NA 141 06/17/2017 1259   K 4.2 07/19/2024 1502   K 4.5 06/17/2017 1259   CL 102 07/19/2024 1502   CL 104 12/14/2012 1031   CO2 31 07/19/2024 1502   CO2 30 (H) 06/17/2017 1259   BUN 21 07/19/2024 1502   BUN 16.4 06/17/2017 1259   CREATININE 0.68 07/19/2024 1502   CREATININE 0.81 02/04/2024 1432   CREATININE 1.1 06/17/2017 1259      Component Value Date/Time   CALCIUM  9.2 07/19/2024 1502   CALCIUM  9.8 06/17/2017 1259   ALKPHOS 98 07/19/2024 1502   ALKPHOS 75 06/17/2017 1259   AST 19  07/19/2024 1502   AST 20 02/04/2024 1432   AST 29 06/17/2017 1259   ALT 15 07/19/2024 1502   ALT 12 02/04/2024 1432   ALT 27 06/17/2017 1259   BILITOT 0.9 07/19/2024 1502   BILITOT 1.0 02/04/2024 1432   BILITOT 2.19 (H) 06/17/2017 1259       RADIOGRAPHIC STUDIES:  No results found.   ASSESSMENT/PLAN:  This is a very pleasant 89 year old Caucasian male with stage 0 chronic lymphocytic leukemia.   The patient has been on observation with no concerning issues.   Clinically does not have any B symptoms.  He had repeat blood work performed that shows total white blood cell count of 43.3k. His Hbg is slightly below normal at 12.7. His elevated WBC is stable. I reviewed his labs with Dr. Sherrod.   Recommend that he continue on observation with repeat blood work in 6 months.   Of course, should he have any concerning lab work performed at any of his other providers offices where always happy to see him sooner.   The patient was advised to call immediately if he has any  concerning symptoms in the interval. The patient voices understanding of current disease status and treatment options and is in agreement with the current care plan. All questions were answered. The patient knows to call the clinic with any problems, questions or concerns. We can certainly see the patient much sooner if necessary   No orders of the defined types were placed in this encounter.    The total time spent in the appointment was 20-29 minutes  Rudolph Dobler L Celso Granja, PA-C 07/30/24

## 2024-08-03 ENCOUNTER — Telehealth: Payer: Self-pay | Admitting: Physician Assistant

## 2024-08-03 ENCOUNTER — Inpatient Hospital Stay: Payer: Medicare Other | Attending: Physician Assistant

## 2024-08-03 ENCOUNTER — Inpatient Hospital Stay (HOSPITAL_BASED_OUTPATIENT_CLINIC_OR_DEPARTMENT_OTHER): Payer: Medicare Other | Admitting: Physician Assistant

## 2024-08-03 VITALS — BP 133/60 | HR 77 | Temp 97.6°F | Resp 14 | Wt 150.3 lb

## 2024-08-03 DIAGNOSIS — Z79899 Other long term (current) drug therapy: Secondary | ICD-10-CM | POA: Diagnosis not present

## 2024-08-03 DIAGNOSIS — Z7982 Long term (current) use of aspirin: Secondary | ICD-10-CM | POA: Insufficient documentation

## 2024-08-03 DIAGNOSIS — C9110 Chronic lymphocytic leukemia of B-cell type not having achieved remission: Secondary | ICD-10-CM | POA: Diagnosis not present

## 2024-08-03 LAB — CBC WITH DIFFERENTIAL (CANCER CENTER ONLY)
Abs Immature Granulocytes: 0.04 K/uL (ref 0.00–0.07)
Basophils Absolute: 0 K/uL (ref 0.0–0.1)
Basophils Relative: 0 %
Eosinophils Absolute: 0.1 K/uL (ref 0.0–0.5)
Eosinophils Relative: 0 %
HCT: 38.8 % — ABNORMAL LOW (ref 39.0–52.0)
Hemoglobin: 12.7 g/dL — ABNORMAL LOW (ref 13.0–17.0)
Immature Granulocytes: 0 %
Lymphocytes Relative: 89 %
Lymphs Abs: 38.7 K/uL — ABNORMAL HIGH (ref 0.7–4.0)
MCH: 31.7 pg (ref 26.0–34.0)
MCHC: 32.7 g/dL (ref 30.0–36.0)
MCV: 96.8 fL (ref 80.0–100.0)
Monocytes Absolute: 1.2 K/uL — ABNORMAL HIGH (ref 0.1–1.0)
Monocytes Relative: 3 %
Neutro Abs: 3.2 K/uL (ref 1.7–7.7)
Neutrophils Relative %: 8 %
Platelet Count: 152 K/uL (ref 150–400)
RBC: 4.01 MIL/uL — ABNORMAL LOW (ref 4.22–5.81)
RDW: 13 % (ref 11.5–15.5)
Smear Review: NORMAL
WBC Count: 43.3 K/uL — ABNORMAL HIGH (ref 4.0–10.5)
nRBC: 0 % (ref 0.0–0.2)

## 2024-08-03 LAB — CMP (CANCER CENTER ONLY)
ALT: 12 U/L (ref 0–44)
AST: 19 U/L (ref 15–41)
Albumin: 4.2 g/dL (ref 3.5–5.0)
Alkaline Phosphatase: 93 U/L (ref 38–126)
Anion gap: 6 (ref 5–15)
BUN: 20 mg/dL (ref 8–23)
CO2: 31 mmol/L (ref 22–32)
Calcium: 9 mg/dL (ref 8.9–10.3)
Chloride: 102 mmol/L (ref 98–111)
Creatinine: 0.79 mg/dL (ref 0.61–1.24)
GFR, Estimated: >60 mL/min (ref >60)
Glucose, Bld: 111 mg/dL — ABNORMAL HIGH (ref 70–99)
Potassium: 4 mmol/L (ref 3.5–5.1)
Sodium: 139 mmol/L (ref 135–145)
Total Bilirubin: 0.9 mg/dL (ref 0.0–1.2)
Total Protein: 6.3 g/dL — ABNORMAL LOW (ref 6.5–8.1)

## 2024-08-03 LAB — LACTATE DEHYDROGENASE: LDH: 169 U/L (ref 98–192)

## 2024-08-03 NOTE — Telephone Encounter (Signed)
 Scheduled appointments per 8/26 los. Talked with the patient and he is aware of the made appointments.

## 2024-08-16 ENCOUNTER — Other Ambulatory Visit: Payer: Self-pay | Admitting: Internal Medicine

## 2024-08-16 NOTE — Telephone Encounter (Unsigned)
 Copied from CRM [id number]. Topic: Clinical - Medication Refill >> Aug 16, 2024  2:54 PM Henretta I wrote: Medication: furosemide  (LASIX ) 20 MG tablet  GEMTESA  75 MG TABS   Has the patient contacted their pharmacy? No (Agent: If no, request that the patient contact the pharmacy for the refill. If patient does not wish to contact the pharmacy document the reason why and proceed with request.) (Agent: If yes, when and what did the pharmacy advise?)  This is the patient's preferred pharmacy:   [facility] [id number] [place], [place] - [address] [place] [address] Phone: [phone] Fax: [phone]  Is this the correct pharmacy for this prescription? Yes If no, delete pharmacy and type the correct one.   Has the prescription been filled recently? No  Is the patient out of the medication? No, he is almost out   Has the patient been seen for an appointment in the last year OR does the patient have an upcoming appointment? Yes  Can we respond through MyChart? Yes  Agent: Please be advised that Rx refills may take up to 3 business days. We ask that you follow-up with your pharmacy.

## 2024-08-17 MED ORDER — GEMTESA 75 MG PO TABS: 1.0000 | ORAL_TABLET | Freq: Every day | ORAL | 2 refills | Status: AC

## 2024-08-17 MED ORDER — FUROSEMIDE 20 MG PO TABS: 20.0000 mg | ORAL_TABLET | Freq: Every day | ORAL | 1 refills | Status: AC

## 2024-09-06 ENCOUNTER — Ambulatory Visit: Payer: Medicare Other

## 2024-09-06 VITALS — Ht 66.0 in | Wt 150.0 lb

## 2024-09-06 DIAGNOSIS — Z0000 Encounter for general adult medical examination without abnormal findings: Secondary | ICD-10-CM | POA: Diagnosis not present

## 2024-09-06 NOTE — Patient Instructions (Addendum)
 Jerry Gomez,  Thank you for taking the time for your Medicare Wellness Visit. I appreciate your continued commitment to your health goals. Please review the care plan we discussed, and feel free to reach out if I can assist you further.  Medicare recommends these wellness visits once per year to help you and your care team stay ahead of potential health issues. These visits are designed to focus on prevention, allowing your provider to concentrate on managing your acute and chronic conditions during your regular appointments.  Please note that Annual Wellness Visits do not include a physical exam. Some assessments may be limited, especially if the visit was conducted virtually. If needed, we may recommend a separate in-person follow-up with your provider.  Ongoing Care Seeing your primary care provider every 3 to 6 months helps us  monitor your health and provide consistent, personalized care.   Referrals If a referral was made during today's visit and you haven't received any updates within two weeks, please contact the referred provider directly to check on the status.  Recommended Screenings:  Health Maintenance  Topic Date Due   Flu Shot  07/09/2024   COVID-19 Vaccine (7 - Pfizer risk 2024-25 season) 08/09/2024   Medicare Annual Wellness Visit  09/06/2025   DTaP/Tdap/Td vaccine (4 - Td or Tdap) 04/06/2033   Pneumococcal Vaccine for age over 101  Completed   HPV Vaccine  Aged Out   Meningitis B Vaccine  Aged Out   Zoster (Shingles) Vaccine  Discontinued       09/06/2024    4:05 PM  Advanced Directives  Does Patient Have a Medical Advance Directive? No  Would patient like information on creating a medical advance directive? No - Patient declined   Advance Care Planning is important because it: Ensures you receive medical care that aligns with your values, goals, and preferences. Provides guidance to your family and loved ones, reducing the emotional burden of decision-making during  critical moments.  Vision: Annual vision screenings are recommended for early detection of glaucoma, cataracts, and diabetic retinopathy. These exams can also reveal signs of chronic conditions such as diabetes and high blood pressure.  Dental: Annual dental screenings help detect early signs of oral cancer, gum disease, and other conditions linked to overall health, including heart disease and diabetes.

## 2024-09-06 NOTE — Progress Notes (Cosign Needed Addendum)
 Subjective:   Jerry Gomez is a 89 y.o. who presents for a Medicare Wellness preventive visit.  As a reminder, Annual Wellness Visits don't include a physical exam, and some assessments may be limited, especially if this visit is performed virtually. We may recommend an in-person follow-up visit with your provider if needed.  Visit Complete: Virtual I connected with  Jerry Gomez on 09/06/24 by a audio enabled telemedicine application and verified that I am speaking with the correct person using two identifiers.  Patient Location: Home  Provider Location: Office/Clinic  I discussed the limitations of evaluation and management by telemedicine. The patient expressed understanding and agreed to proceed.  Vital Signs: Because this visit was a virtual/telehealth visit, some criteria may be missing or patient reported. Any vitals not documented were not able to be obtained and vitals that have been documented are patient reported.  VideoDeclined- This patient declined Librarian, academic. Therefore the visit was completed with audio only.  Persons Participating in Visit: Patient.  AWV Questionnaire: No: Patient Medicare AWV questionnaire was not completed prior to this visit.  Cardiac Risk Factors include: advanced age (>26men, >62 women);dyslipidemia;hypertension;male gender     Objective:    Today's Vitals   09/06/24 1605  Weight: 150 lb (68 kg)  Height: 5' 6 (1.676 m)   Body mass index is 24.21 kg/m.     09/06/2024    4:05 PM 08/03/2024    2:46 PM 09/02/2023    3:59 PM 06/16/2023    3:53 AM 04/07/2023   10:53 AM 08/19/2022    2:57 PM 04/03/2022    2:19 PM  Advanced Directives  Does Patient Have a Medical Advance Directive? No No Yes Unable to assess, patient is non-responsive or altered mental status Yes Yes Yes  Type of Paediatric nurse Power of [place];Living will   Healthcare Power of [place];Living will Healthcare Power of  [place];Living will  Does patient want to make changes to medical advance directive?   No - Patient declined    No - Patient declined  Copy of Healthcare Power of Attorney in Chart?   No - copy requested   No - copy requested No - copy requested  Would patient like information on creating a medical advance directive? No - Patient declined No - Patient declined         Current Medications (verified) Outpatient Encounter Medications as of 09/06/2024  Medication Sig   Artificial Tear Ointment (ARTIFICIAL TEARS) ointment Place 1 drop into both eyes daily as needed (dry eyes).   aspirin  81 MG EC tablet Take 81 mg by mouth daily. Swallow whole.   B Complex-Folic Acid (B COMPLEX PLUS) TABS Take 1 tablet by mouth every other day.   Cholecalciferol  (VITAMIN D ) 2000 units tablet Take 2,000 Units by mouth daily.   Ensure (ENSURE) Take 237 mLs by mouth daily.   finasteride (PROSCAR) 5 MG tablet Take 5 mg by mouth daily.   furosemide  (LASIX ) 20 MG tablet Take 1 tablet (20 mg total) by mouth daily.   methylphenidate  (RITALIN ) 5 MG tablet Take 2.5 or 5 mg at breakfast and lunch   Multiple Vitamins-Minerals (MULTIVITAMIN ADULT) CHEW Take one qod   Multiple Vitamins-Minerals (PRESERVISION AREDS PO) Take 1 capsule by mouth daily at 6 (six) AM.   mupirocin  ointment (BACTROBAN ) 2 % ON LEG WOUND W/DRESSING CHANGE ONCE OR TWICE DAILY   Vibegron  (GEMTESA ) 75 MG TABS Take 1 tablet (75 mg total) by mouth daily.  No facility-administered encounter medications on file as of 09/06/2024.    Allergies (verified) Patient has no known allergies.   History: Past Medical History:  Diagnosis Date   Cancer (HCC)    CLL   Cataract    COLONIC POLYPS 03/02/2009   EYE SURGERY, HX OF 09/05/2007   HEMORRHOIDS, HX OF    HYPERLIPIDEMIA 09/05/2007   HYPERTENSION 07/25/2007   OSTEOARTHRITIS 09/05/2007   SLEEP APNEA, OBSTRUCTIVE 09/05/2007   Past Surgical History:  Procedure Laterality Date   EYE SURGERY  10/22/13   left  eye, cataract   Family History  Problem Relation Age of Onset   Other Mother 44       natural causes   Kidney disease Father    Cancer Father        prostate-late on-set   Cancer Brother        non-hodgkins lymphoma   Colon cancer Neg Hx    Esophageal cancer Neg Hx    Rectal cancer Neg Hx    Stomach cancer Neg Hx    Social History   Socioeconomic History   Marital status: Married    Spouse name: Mary   Number of children: 2   Years of education: 13   Highest education level: Not on file  Occupational History   Occupation: retired  Tobacco Use   Smoking status: Former    Current packs/day: 0.00    Types: Cigarettes    Quit date: 02/07/1963    Years since quitting: 61.6    Passive exposure: Past   Smokeless tobacco: Never   Tobacco comments:    Rarely smoked between ages [age]   Vaping Use   Vaping status: Never Used  Substance and Sexual Activity   Alcohol  use: Yes    Alcohol /week: 14.0 standard drinks of alcohol     Types: 14 Standard drinks or equivalent per week    Comment: 1-2 margarita nightly   Drug use: No   Sexual activity: Not Currently    Partners: Female  Other Topics Concern   Not on file  Social History Narrative   HS[employer], [place]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 Married '54. 2 sons ' 63, '67. 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 CPR- yes, short-term mechanical ventilation, no prolonged heroic or futile measures.       Right hand   Leaving 2 level home   Drink coffee and ice tea   Social Drivers of Health   Financial Resource Strain: Low Risk  (09/06/2024)   Overall Financial Resource Strain (CARDIA)    Difficulty of Paying Living Expenses: Not hard at all  Food Insecurity: No Food Insecurity (09/06/2024)   Hunger Vital Sign    Worried About Running Out of Food in the Last Year: Never true    Ran Out of Food in the Last Year: Never true  Transportation Needs: No Transportation Needs (09/06/2024)   PRAPARE -  Administrator, Civil Service (Medical): No    Lack of Transportation (Non-Medical): No  Physical Activity: Inactive (09/06/2024)   Exercise Vital Sign    Days of Exercise per Week: 0 days    Minutes of Exercise per Session: 0 min  Stress: No Stress Concern Present (09/06/2024)   [employer] of Occupational Health - Occupational Stress Questionnaire    Feeling of Stress: Not at all  Social Connections: Moderately Integrated (09/06/2024)   Social Connection and Isolation Panel    Frequency of Communication with Friends and Family: More  than three times a week    Frequency of Social Gatherings with Friends and Family: Once a week    Attends Religious Services: Never    Database administrator or Organizations: Yes    Attends Engineer, structural: 1 to 4 times per year    Marital Status: Married    Tobacco Counseling Counseling given: Not Answered Tobacco comments: Rarely smoked between ages [age]     Clinical Intake:  Pre-visit preparation completed: Yes  Pain : No/denies pain     BMI - recorded: 24.21 Nutritional Status: BMI of 19-24  Normal Nutritional Risks: None Diabetes: No  No results found for: HGBA1C   How often do you need to have someone help you when you read instructions, pamphlets, or other written materials from your doctor or pharmacy?: 1 - Never  Interpreter Needed?: No  Information entered by :: Verdie Saba, CMA   Activities of Daily Living     09/06/2024    4:08 PM  In your present state of health, do you have any difficulty performing the following activities:  Hearing? 0  Vision? 0  Difficulty concentrating or making decisions? 0  Walking or climbing stairs? 1  Comment uses a walker/wheelchair  Dressing or bathing? 0  Doing errands, shopping? 0  Preparing Food and eating ? N  Using the Toilet? N  In the past six months, have you accidently leaked urine? N  Do you have problems with loss of bowel control? N   Managing your Medications? N  Managing your Finances? N  Housekeeping or managing your Housekeeping? N    Patient Care Team: [name], Karlynn GAILS, MD as PCP - General (Internal Medicine) Dermatology, [facility] (Dermatology) Sherrod Sherrod, MD as Consulting Physician (Oncology) Leigh Venetia CROME, MD as Consulting Physician (Neurology) Patrcia Sharper, MD as Consulting Physician (Ophthalmology) Elisabeth Valli BIRCH, MD as Consulting Physician (Urology)  I have updated your Care Teams any recent Medical Services you may have received from other providers in the past year.     Assessment:   This is a routine wellness examination for [place].  Hearing/Vision screen Hearing Screening - Comments:: Denies hearing difficulties   Vision Screening - Comments:: Wears rx glasses - up to date with routine eye exams with Sharper Patrcia   Goals Addressed               This Visit's Progress     Patient Stated (pt-stated)        Patient stated he plans to continue walking and manage the discomfort of lower extremities       Depression Screen     09/06/2024    4:09 PM 08/03/2024    2:47 PM 07/19/2024    1:59 PM 01/19/2024    2:02 PM 10/16/2023    2:55 PM 09/02/2023    3:57 PM 04/03/2023    2:07 PM  PHQ 2/9 Scores  PHQ - 2 Score 0 0 0 0 0 0 0  PHQ- 9 Score 3    0 0     Fall Risk     09/06/2024    4:08 PM 07/19/2024    1:59 PM 01/19/2024    2:02 PM 10/16/2023    2:54 PM 09/02/2023    4:00 PM  Fall Risk   Falls in the past year? 0 0 0 1 1  Number falls in past yr: 0 0 0 1 1  Injury with Fall? 0 0 0 1 1  Risk for fall due to :  No Fall Risks No Fall Risks No Fall Risks Impaired balance/gait;History of fall(s) History of fall(s);Impaired balance/gait;Impaired mobility;Orthopedic patient  Follow up Falls evaluation completed;Falls prevention discussed Falls evaluation completed Falls evaluation completed Falls evaluation completed Falls evaluation completed;Education provided;Falls prevention  discussed    MEDICARE RISK AT HOME:  Medicare Risk at Home Any stairs in or around the home?: Yes If so, are there any without handrails?: No Home free of loose throw rugs in walkways, pet beds, electrical cords, etc?: Yes Adequate lighting in your home to reduce risk of falls?: Yes Life alert?: No Use of a cane, walker or w/c?: Yes (walker/wheelchair) Grab bars in the bathroom?: Yes Shower chair or bench in shower?: Yes Elevated toilet seat or a handicapped toilet?: No  TIMED UP AND GO:  Was the test performed?  No  Cognitive Function: Impaired: Patient has current diagnosis of cognitive impairment.  Patient scored = 20pts on the Cognitive Exam.      04/20/2018   10:29 AM  MMSE - Mini Mental State Exam  Orientation to time 5  Orientation to Place 5  Registration 3  Attention/ Calculation 4  Recall 1  Language- name 2 objects 2  Language- repeat 1  Language- follow 3 step command 3  Language- read & follow direction 1  Write a sentence 1  Copy design 1  Total score 27        09/06/2024    4:10 PM 09/02/2023    4:02 PM 08/19/2022    2:59 PM  6CIT Screen  What Year? 0 points 0 points 0 points  What month? 3 points 0 points 0 points  What time? 3 points 0 points 0 points  Count back from 20 0 points 0 points 0 points  Months in reverse 4 points 0 points 0 points  Repeat phrase 10 points 10 points 2 points  Total Score 20 points 10 points 2 points    Immunizations Immunization History  Administered Date(s) Administered   DTP 09/08/1998   Fluad  Quad(high Dose 65+) 07/28/2019, 09/12/2020, 08/22/2021, 08/29/2022   Fluad  Trivalent(High Dose 65+) 09/15/2023   INFLUENZA, HIGH DOSE SEASONAL PF 09/14/2013, 08/29/2016, 09/09/2017, 08/24/2018   Influenza Split 08/28/2011, 09/08/2012   Influenza Whole 09/07/2008, 09/02/2009, 09/06/2010   Influenza,inj,Quad PF,6+ Mos 08/04/2013, 09/08/2014, 09/22/2015   PFIZER(Purple Top)SARS-COV-2 Vaccination 12/28/2019, 01/18/2020,  07/28/2020   Pfizer Covid-19 Vaccine Bivalent Booster 49yrs & up 09/20/2021   Pfizer(Comirnaty )Fall Seasonal Vaccine 12 years and older 10/11/2022, 09/15/2023   Pneumococcal Conjugate-13 10/10/2014   Pneumococcal Polysaccharide-23 09/08/2001, 01/15/2010, 10/28/2018   Td 01/15/2010   Tdap 04/07/2023   Zoster, Live 10/16/2007, 05/05/2011    Screening Tests Health Maintenance  Topic Date Due   Influenza Vaccine  07/09/2024   COVID-19 Vaccine (7 - Pfizer risk 2024-25 season) 08/09/2024   Medicare Annual Wellness (AWV)  09/06/2025   DTaP/Tdap/Td (4 - Td or Tdap) 04/06/2033   Pneumococcal Vaccine: 50+ Years  Completed   HPV VACCINES  Aged Out   Meningococcal B Vaccine  Aged Out   Zoster Vaccines- Shingrix  Discontinued    Health Maintenance Items Addressed:  I have recommended that this patient have a flu shot but he declines at this time. I have discussed the risks and benefits of this procedure with him. The patient verbalizes understanding.   Additional Screening:  Vision Screening: Recommended annual ophthalmology exams for early detection of glaucoma and other disorders of the eye. Is the patient up to date with their annual eye exam?  Yes  Who is the provider or what is the name of the office in which the patient attends annual eye exams? [facility]  Dental Screening: Recommended annual dental exams for proper oral hygiene  Community Resource Referral / Chronic Care Management: CRR required this visit?  No   CCM required this visit?  No   Plan:    I have personally reviewed and noted the following in the patient's chart:   Medical and social history Use of alcohol , tobacco or illicit drugs  Current medications and supplements including opioid prescriptions. Patient is not currently taking opioid prescriptions. Functional ability and status Nutritional status Physical activity Advanced directives List of other physicians Hospitalizations, surgeries, and ER  visits in previous 12 months Vitals Screenings to include cognitive, depression, and falls Referrals and appointments  In addition, I have reviewed and discussed with patient certain preventive protocols, quality metrics, and best practice recommendations. A written personalized care plan for preventive services as well as general preventive health recommendations were provided to patient.   Verdie CHRISTELLA Saba, CMA   09/06/2024   After Visit Summary: (MyChart) Due to this being a telephonic visit, the after visit summary with patients personalized plan was offered to patient via MyChart   Notes: Patient scored = 20pts on the Cognitive Exam.  Has a dx-memory changes.  Next appt w/PCP is 10/2024.  Pt states he feels good and does not want to be seen sooner.    Medical screening examination/treatment/procedure(s) were performed by non-physician practitioner and as supervising physician I was immediately available for consultation/collaboration.  I agree with above. Karlynn Noel, MD

## 2024-09-14 ENCOUNTER — Other Ambulatory Visit: Payer: Self-pay | Admitting: Internal Medicine

## 2024-09-14 NOTE — Telephone Encounter (Signed)
 Copied from CRM #[id number]. Topic: Clinical - Medication Refill >> Sep 14, 2024  3:15 PM Alfonso ORN wrote: Medication:  methylphenidate  (RITALIN ) 5 MG tablet  Has the patient contacted their pharmacy? No  This is the patient's preferred pharmacy:   [facility] [id number] [place], [place] - [address] [place] [address] Phone: [phone] Fax: [phone]  Is this the correct pharmacy for this prescription? Yes  Has the prescription been filled recently? No  Is the patient out of the medication? No  Has the patient been seen for an appointment in the last year OR does the patient have an upcoming appointment? Yes  Can we respond through MyChart? No  Agent: Please be advised that Rx refills may take up to 3 business days. We ask that you follow-up with your pharmacy.

## 2024-09-16 ENCOUNTER — Ambulatory Visit

## 2024-09-16 DIAGNOSIS — Z23 Encounter for immunization: Secondary | ICD-10-CM

## 2024-09-16 NOTE — Progress Notes (Addendum)
 Pt was given High Dose Flu vaccine with no complications.  Medical screening examination/treatment/procedure(s) were performed by non-physician practitioner and as supervising physician I was immediately available for consultation/collaboration.  I agree with above. Karlynn Noel, MD

## 2024-09-20 NOTE — Telephone Encounter (Signed)
 Pt's wife called back because refill was not sent in. Pt only has 2 pills left. Please advise.

## 2024-09-21 MED ORDER — METHYLPHENIDATE HCL 5 MG PO TABS: ORAL_TABLET | ORAL | 0 refills | Status: DC

## 2024-10-20 ENCOUNTER — Ambulatory Visit: Admitting: Internal Medicine

## 2024-10-25 ENCOUNTER — Ambulatory Visit: Admitting: Internal Medicine

## 2024-11-16 ENCOUNTER — Ambulatory Visit: Admitting: Internal Medicine

## 2024-11-16 ENCOUNTER — Encounter: Payer: Self-pay | Admitting: Internal Medicine

## 2024-11-16 VITALS — BP 118/56 | HR 79 | Temp 97.8°F | Ht 66.0 in

## 2024-11-16 DIAGNOSIS — M4807 Spinal stenosis, lumbosacral region: Secondary | ICD-10-CM

## 2024-11-16 DIAGNOSIS — G47419 Narcolepsy without cataplexy: Secondary | ICD-10-CM | POA: Diagnosis not present

## 2024-11-16 DIAGNOSIS — C9110 Chronic lymphocytic leukemia of B-cell type not having achieved remission: Secondary | ICD-10-CM

## 2024-11-16 DIAGNOSIS — R400 Somnolence: Secondary | ICD-10-CM | POA: Diagnosis not present

## 2024-11-16 MED ORDER — METHYLPHENIDATE HCL 5 MG PO TABS: 5.0000 mg | ORAL_TABLET | Freq: Two times a day (BID) | ORAL | 0 refills | Status: DC

## 2024-11-16 MED ORDER — METHYLPHENIDATE HCL 5 MG PO TABS: ORAL_TABLET | ORAL | 0 refills | Status: AC

## 2024-11-16 MED ORDER — METHYLPHENIDATE HCL 5 MG PO TABS: 5.0000 mg | ORAL_TABLET | Freq: Two times a day (BID) | ORAL | 0 refills | Status: AC

## 2024-11-16 MED ORDER — METHYLPHENIDATE HCL 5 MG PO TABS: ORAL_TABLET | ORAL | 0 refills | Status: DC

## 2024-11-16 NOTE — Assessment & Plan Note (Signed)
 Much better on Ritalin  Cont to use Ritalin  w/caution  Potential benefits of a long term amphetamines  use as well as potential risks  and complications were explained to the patient and were aknowledged.

## 2024-11-16 NOTE — Progress Notes (Signed)
 Subjective:  Patient ID: Jerry Gomez, male    DOB: [birth year]  Age: 89 y.o. MRN: [id number]  CC: Medical Management of Chronic Issues (3 Month follow  up)   HPI Jerry Gomez presents for leg weakness, memory issues, narcolepsy  Outpatient Medications Prior to Visit  Medication Sig Dispense Refill   Artificial Tear Ointment (ARTIFICIAL TEARS) ointment Place 1 drop into both eyes daily as needed (dry eyes).     aspirin  81 MG EC tablet Take 81 mg by mouth daily. Swallow whole.     B Complex-Folic Acid (B COMPLEX PLUS) TABS Take 1 tablet by mouth every other day. 100 tablet 3   Cholecalciferol  (VITAMIN D ) 2000 units tablet Take 2,000 Units by mouth daily.     Ensure (ENSURE) Take 237 mLs by mouth daily.     finasteride (PROSCAR) 5 MG tablet Take 5 mg by mouth daily.     furosemide  (LASIX ) 20 MG tablet Take 1 tablet (20 mg total) by mouth daily. 90 tablet 1   Multiple Vitamins-Minerals (MULTIVITAMIN ADULT) CHEW Take one qod 100 tablet 3   Multiple Vitamins-Minerals (PRESERVISION AREDS PO) Take 1 capsule by mouth daily at 6 (six) AM.     mupirocin  ointment (BACTROBAN ) 2 % ON LEG WOUND W/DRESSING CHANGE ONCE OR TWICE DAILY 22 g 1   Vibegron  (GEMTESA ) 75 MG TABS Take 1 tablet (75 mg total) by mouth daily. 90 tablet 2   methylphenidate  (RITALIN ) 5 MG tablet Take 2.5 or 5 mg at breakfast and lunch 60 tablet 0   No facility-administered medications prior to visit.    ROS: Review of Systems  Constitutional:  Positive for fatigue. Negative for appetite change and unexpected weight change.  HENT:  Negative for congestion, nosebleeds, sneezing, sore throat and trouble swallowing.   Eyes:  Negative for itching and visual disturbance.  Respiratory:  Negative for cough.   Cardiovascular:  Negative for chest pain, palpitations and leg swelling.  Gastrointestinal:  Negative for abdominal distention, blood in stool, diarrhea and nausea.  Genitourinary:  Negative for frequency and hematuria.   Musculoskeletal:  Positive for gait problem. Negative for back pain, joint swelling and neck pain.  Skin:  Negative for rash.  Neurological:  Negative for dizziness, tremors, speech difficulty and weakness.  Psychiatric/Behavioral:  Positive for decreased concentration and sleep disturbance. Negative for agitation, dysphoric mood and suicidal ideas. The patient is not nervous/anxious.     Objective:  BP (!) 118/56   Pulse 79   Temp 97.8 F (36.6 C)   Ht 5' 6 (1.676 m)   SpO2 98%   BMI 24.21 kg/m   BP Readings from Last 3 Encounters:  11/16/24 (!) 118/56  08/03/24 133/60  07/19/24 105/61    Wt Readings from Last 3 Encounters:  09/06/24 150 lb (68 kg)  08/03/24 150 lb 4.8 oz (68.2 kg)  07/19/24 152 lb (68.9 kg)    Physical Exam Constitutional:      General: He is not in acute distress.    Appearance: Normal appearance. He is well-developed.     Comments: NAD  Eyes:     Conjunctiva/sclera: Conjunctivae normal.     Pupils: Pupils are equal, round, and reactive to light.  Neck:     Thyroid : No thyromegaly.     Vascular: No JVD.  Cardiovascular:     Rate and Rhythm: Normal rate and regular rhythm.     Heart sounds: Normal heart sounds. No murmur heard.    No friction rub. No  gallop.  Pulmonary:     Effort: Pulmonary effort is normal. No respiratory distress.     Breath sounds: Normal breath sounds. No wheezing or rales.  Chest:     Chest wall: No tenderness.  Abdominal:     General: Bowel sounds are normal. There is no distension.     Palpations: Abdomen is soft. There is no mass.     Tenderness: There is no abdominal tenderness. There is no guarding or rebound.  Musculoskeletal:        General: No tenderness. Normal range of motion.     Cervical back: Normal range of motion.     Right lower leg: No edema.     Left lower leg: No edema.  Lymphadenopathy:     Cervical: No cervical adenopathy.  Skin:    General: Skin is warm and dry.     Findings: No rash.   Neurological:     Mental Status: He is alert and oriented to person, place, and time.     Cranial Nerves: No cranial nerve deficit.     Motor: No abnormal muscle tone.     Coordination: Coordination normal.     Gait: Gait normal.     Deep Tendon Reflexes: Reflexes are normal and symmetric.  Psychiatric:        Behavior: Behavior normal.        Thought Content: Thought content normal.        Judgment: Judgment normal.   In a w/c  Lab Results  Component Value Date   WBC 43.3 (H) 08/03/2024   HGB 12.7 (L) 08/03/2024   HCT 38.8 (L) 08/03/2024   PLT 152 08/03/2024   GLUCOSE 111 (H) 08/03/2024   CHOL 119 08/14/2021   TRIG 57.0 08/14/2021   HDL 53.30 08/14/2021   LDLCALC 55 08/14/2021   ALT 12 08/03/2024   AST 19 08/03/2024   NA 139 08/03/2024   K 4.0 08/03/2024   CL 102 08/03/2024   CREATININE 0.79 08/03/2024   BUN 20 08/03/2024   CO2 31 08/03/2024   TSH 1.56 07/19/2024   PSA 8.51 (H) 08/14/2021    CT Cervical Spine Wo Contrast Result Date: 06/15/2023 CLINICAL DATA:  Status post multiple falls. EXAM: CT CERVICAL SPINE WITHOUT CONTRAST TECHNIQUE: Multidetector CT imaging of the cervical spine was performed without intravenous contrast. Multiplanar CT image reconstructions were also generated. RADIATION DOSE REDUCTION: This exam was performed according to the departmental dose-optimization program which includes automated exposure control, adjustment of the mA and/or kV according to patient size and/or use of iterative reconstruction technique. COMPARISON:  None Available. FINDINGS: Alignment: There is approximally 4 mm, chronic appearing retrolisthesis of the C5 vertebral body on C6. Skull base and vertebrae: No acute fracture. No primary bone lesion or focal pathologic process. Soft tissues and spinal canal: No prevertebral fluid or swelling. No visible canal hematoma. Disc levels: Marked severity endplate sclerosis is seen at the levels of C3-C4 and C5-C6, with mild to moderate  severity endplate sclerosis noted throughout the remainder of the cervical spine. Marked severity anterior osteophyte formation and posterior bony spurring are also seen at C5-C6. There is marked severity narrowing of the anterior atlantoaxial articulation. Marked severity intervertebral disc space narrowing is seen at C5-C6. Moderate severity intervertebral disc space narrowing is present at the levels of C3-C4, C4-C5 and C6-C7. Bilateral marked severity multilevel facet joint hypertrophy is noted. Upper chest: Negative. Other: None. IMPRESSION: 1. Marked severity multilevel degenerative changes, most prominent at the levels of C3-C4  and C5-C6. 2. Chronic appearing retrolisthesis of the C5 vertebral body on C6. 3. No acute cervical spine fracture. Electronically Signed   By: Suzen Dials M.D.   On: 06/15/2023 01:08   CT Head Wo Contrast Result Date: 06/15/2023 CLINICAL DATA:  Multiple falls. EXAM: CT HEAD WITHOUT CONTRAST TECHNIQUE: Contiguous axial images were obtained from the base of the skull through the vertex without intravenous contrast. RADIATION DOSE REDUCTION: This exam was performed according to the departmental dose-optimization program which includes automated exposure control, adjustment of the mA and/or kV according to patient size and/or use of iterative reconstruction technique. COMPARISON:  October 24, 2022 FINDINGS: Brain: There is mild cerebral atrophy with widening of the extra-axial spaces and ventricular dilatation. There are areas of decreased attenuation within the white matter tracts of the supratentorial brain, consistent with microvascular disease changes. Vascular: No hyperdense vessel or unexpected calcification. Skull: Normal. Negative for fracture or focal lesion. Sinuses/Orbits: No acute finding. Other: None. IMPRESSION: 1. Generalized cerebral atrophy with chronic white matter small vessel ischemic changes. 2. No acute intracranial abnormality. Electronically Signed   By:  Suzen Dials M.D.   On: 06/15/2023 01:00   DG Pelvis 1-2 Views Result Date: 06/15/2023 CLINICAL DATA:  Status post multiple falls. EXAM: PELVIS - 1-2 VIEW COMPARISON:  January 10, 2022 FINDINGS: There is no evidence of an acute pelvic fracture or diastasis. Marked severity degenerative changes are seen involving both hips, in the form of joint space narrowing, subchondral cysts formation and acetabular sclerosis. Marked severe flattening of the bilateral femoral heads is also seen. IMPRESSION: Marked severity degenerative changes involving both hips, with additional findings consistent with sequelae related to avascular necrosis of the bilateral femoral heads. Electronically Signed   By: Suzen Dials M.D.   On: 06/15/2023 00:59   DG Shoulder Left Result Date: 06/15/2023 CLINICAL DATA:  Status post multiple falls. EXAM: LEFT SHOULDER - 2+ VIEW COMPARISON:  None Available. FINDINGS: There is no evidence of an acute fracture or dislocation. Moderate severity degenerative changes seen involving the left acromioclavicular joint and left glenohumeral articulation. Mild lateral soft tissue swelling is noted. IMPRESSION: Moderate severity degenerative changes without an acute fracture or dislocation. Electronically Signed   By: Suzen Dials M.D.   On: 06/15/2023 00:56   DG Elbow Complete Left Result Date: 06/15/2023 CLINICAL DATA:  Status post multiple falls. EXAM: LEFT ELBOW - COMPLETE 3+ VIEW COMPARISON:  None Available. FINDINGS: There is no evidence of an acute fracture, dislocation, or joint effusion. Chronic and degenerative changes are seen involving the mediolateral epicondyles of the distal left humerus. Soft tissues are unremarkable. IMPRESSION: Chronic and degenerative changes without an acute fracture or dislocation. Electronically Signed   By: Suzen Dials M.D.   On: 06/15/2023 00:55   DG Chest 2 View Result Date: 06/15/2023 CLINICAL DATA:  Multiple falls. EXAM: CHEST - 2 VIEW  COMPARISON:  None Available. FINDINGS: The heart size and mediastinal contours are within normal limits. There is moderate severity calcification of the aortic arch. There is no evidence of an acute infiltrate, pleural effusion or pneumothorax. Multilevel degenerative changes seen throughout the thoracic spine. IMPRESSION: No active cardiopulmonary disease. Electronically Signed   By: Suzen Dials M.D.   On: 06/15/2023 00:54    Assessment & Plan:   Problem List Items Addressed This Visit     CLL (chronic lymphocytic leukemia) (HCC)   Monitor CBC/WBC      Narcolepsy - Primary   Much better on Ritalin  Cont to use  Ritalin  w/caution  Potential benefits of a long term amphetamines  use as well as potential risks  and complications were explained to the patient and were aknowledged.      Somnolence, daytime   Much better on Ritalin  Cont to use Ritalin  w/caution  Potential benefits of a long term amphetamines  use as well as potential risks  and complications were explained to the patient and were aknowledged.      Spinal stenosis   Stable  W/c bound         Meds ordered this encounter  Medications   DISCONTD: methylphenidate  (RITALIN ) 5 MG tablet    Sig: Take 2.5 or 5 mg at breakfast and lunch    Dispense:  60 tablet    Refill:  0    Please fill on or after 11/16/2024   DISCONTD: methylphenidate  (RITALIN ) 5 MG tablet    Sig: Take 1 tablet (5 mg total) by mouth 2 (two) times daily with breakfast and lunch.    Dispense:  60 tablet    Refill:  0    Please fill on or after 01/15/2025   DISCONTD: methylphenidate  (RITALIN ) 5 MG tablet    Sig: Take 1 tablet (5 mg total) by mouth 2 (two) times daily with breakfast and lunch.    Dispense:  60 tablet    Refill:  0    Please fill on or after 12/16/2024   methylphenidate  (RITALIN ) 5 MG tablet    Sig: Take 1 tablet (5 mg total) by mouth 2 (two) times daily with breakfast and lunch.    Dispense:  60 tablet    Refill:  0    Please fill  on or after 01/15/2025   methylphenidate  (RITALIN ) 5 MG tablet    Sig: Take 1 tablet (5 mg total) by mouth 2 (two) times daily with breakfast and lunch.    Dispense:  60 tablet    Refill:  0    Please fill on or after 12/16/2024   methylphenidate  (RITALIN ) 5 MG tablet    Sig: Take 2.5 or 5 mg at breakfast and lunch    Dispense:  60 tablet    Refill:  0    Please fill on or after 11/16/2024      Follow-up: Return in about 3 months (around 02/14/2025) for a follow-up visit.  Marolyn Noel, MD

## 2024-11-16 NOTE — Patient Instructions (Signed)

## 2024-11-16 NOTE — Assessment & Plan Note (Signed)
Monitor CBC/WBC 

## 2024-11-16 NOTE — Assessment & Plan Note (Signed)
 Stable  W/c bound

## 2025-01-11 ENCOUNTER — Other Ambulatory Visit: Payer: Self-pay

## 2025-01-11 NOTE — Telephone Encounter (Unsigned)
 Copied from CRM [id number]. Topic: Clinical - Medication Refill >> Jan 10, 2025  2:53 PM Alexandria E wrote: Medication: methylphenidate  (RITALIN ) 5 MG tablet  Has the patient contacted their pharmacy? Yes (Agent: If no, request that the patient contact the pharmacy for the refill. If patient does not wish to contact the pharmacy document the reason why and proceed with request.) (Agent: If yes, when and what did the pharmacy advise?)  This is the patient's preferred pharmacy:    [facility] [id number] [place], [place] - [address] [place] [address] Phone: [phone] Fax: [phone]  Is this the correct pharmacy for this prescription? Yes If no, delete pharmacy and type the correct one.   Has the prescription been filled recently? No  Is the patient out of the medication? No, 7 tablets left.  Has the patient been seen for an appointment in the last year OR does the patient have an upcoming appointment? Yes  Can we respond through MyChart? Yes  Agent: Please be advised that Rx refills may take up to 3 business days. We ask that you follow-up with your pharmacy.

## 2025-02-03 ENCOUNTER — Ambulatory Visit: Admitting: Physician Assistant

## 2025-02-03 ENCOUNTER — Inpatient Hospital Stay

## 2025-02-03 ENCOUNTER — Inpatient Hospital Stay: Admitting: Physician Assistant

## 2025-02-03 ENCOUNTER — Other Ambulatory Visit

## 2025-02-14 ENCOUNTER — Ambulatory Visit: Admitting: Internal Medicine

## 2025-02-24 ENCOUNTER — Ambulatory Visit: Admitting: Internal Medicine

## 2025-09-08 ENCOUNTER — Ambulatory Visit
